# Patient Record
Sex: Male | Born: 1965 | Race: Black or African American | Hispanic: No | Marital: Married | Smoking: Never smoker
Health system: Southern US, Community
[De-identification: ages and names within clinical notes are randomized; demographics above are authoritative.]

## PROBLEM LIST (undated history)

## (undated) DIAGNOSIS — G459 Transient cerebral ischemic attack, unspecified: Secondary | ICD-10-CM

## (undated) DIAGNOSIS — I10 Essential (primary) hypertension: Secondary | ICD-10-CM

## (undated) HISTORY — DX: Transient cerebral ischemic attack, unspecified: G45.9

---

## 2011-11-30 ENCOUNTER — Other Ambulatory Visit: Payer: Self-pay | Admitting: Orthopedic Surgery

## 2011-11-30 DIAGNOSIS — M25562 Pain in left knee: Secondary | ICD-10-CM

## 2011-12-01 ENCOUNTER — Other Ambulatory Visit: Payer: Self-pay

## 2016-02-20 DIAGNOSIS — I10 Essential (primary) hypertension: Secondary | ICD-10-CM | POA: Insufficient documentation

## 2017-07-08 DIAGNOSIS — E7800 Pure hypercholesterolemia, unspecified: Secondary | ICD-10-CM | POA: Insufficient documentation

## 2019-02-17 DIAGNOSIS — U071 COVID-19: Secondary | ICD-10-CM

## 2019-02-17 HISTORY — DX: COVID-19: U07.1

## 2021-04-13 ENCOUNTER — Encounter (HOSPITAL_COMMUNITY): Payer: Self-pay | Admitting: Student in an Organized Health Care Education/Training Program

## 2021-04-13 ENCOUNTER — Other Ambulatory Visit: Payer: Self-pay

## 2021-04-13 ENCOUNTER — Emergency Department (HOSPITAL_COMMUNITY): Payer: Self-pay

## 2021-04-13 ENCOUNTER — Inpatient Hospital Stay (HOSPITAL_COMMUNITY)
Admission: EM | Admit: 2021-04-13 | Discharge: 2021-04-17 | DRG: 064 | Disposition: A | Discharge status: Rehabilitation Facility | Payer: Self-pay | Attending: Internal Medicine | Admitting: Internal Medicine

## 2021-04-13 DIAGNOSIS — R29810 Facial weakness: Secondary | ICD-10-CM | POA: Diagnosis present

## 2021-04-13 DIAGNOSIS — E876 Hypokalemia: Secondary | ICD-10-CM | POA: Diagnosis present

## 2021-04-13 DIAGNOSIS — Z91199 Patient's noncompliance with other medical treatment and regimen due to unspecified reason: Secondary | ICD-10-CM

## 2021-04-13 DIAGNOSIS — I615 Nontraumatic intracerebral hemorrhage, intraventricular: Secondary | ICD-10-CM | POA: Diagnosis present

## 2021-04-13 DIAGNOSIS — Z8249 Family history of ischemic heart disease and other diseases of the circulatory system: Secondary | ICD-10-CM

## 2021-04-13 DIAGNOSIS — G8194 Hemiplegia, unspecified affecting left nondominant side: Secondary | ICD-10-CM | POA: Diagnosis present

## 2021-04-13 DIAGNOSIS — Z9114 Patient's other noncompliance with medication regimen: Secondary | ICD-10-CM

## 2021-04-13 DIAGNOSIS — S06330A Contusion and laceration of cerebrum, unspecified, without loss of consciousness, initial encounter: Secondary | ICD-10-CM

## 2021-04-13 DIAGNOSIS — R29713 NIHSS score 13: Secondary | ICD-10-CM | POA: Diagnosis present

## 2021-04-13 DIAGNOSIS — E785 Hyperlipidemia, unspecified: Secondary | ICD-10-CM | POA: Diagnosis present

## 2021-04-13 DIAGNOSIS — I161 Hypertensive emergency: Secondary | ICD-10-CM | POA: Diagnosis present

## 2021-04-13 DIAGNOSIS — Z20822 Contact with and (suspected) exposure to covid-19: Secondary | ICD-10-CM | POA: Diagnosis present

## 2021-04-13 DIAGNOSIS — R471 Dysarthria and anarthria: Secondary | ICD-10-CM | POA: Diagnosis present

## 2021-04-13 DIAGNOSIS — I674 Hypertensive encephalopathy: Secondary | ICD-10-CM | POA: Diagnosis present

## 2021-04-13 DIAGNOSIS — I610 Nontraumatic intracerebral hemorrhage in hemisphere, subcortical: Principal | ICD-10-CM | POA: Diagnosis present

## 2021-04-13 DIAGNOSIS — I129 Hypertensive chronic kidney disease with stage 1 through stage 4 chronic kidney disease, or unspecified chronic kidney disease: Secondary | ICD-10-CM | POA: Diagnosis present

## 2021-04-13 DIAGNOSIS — E1122 Type 2 diabetes mellitus with diabetic chronic kidney disease: Secondary | ICD-10-CM | POA: Diagnosis present

## 2021-04-13 DIAGNOSIS — Z6829 Body mass index (BMI) 29.0-29.9, adult: Secondary | ICD-10-CM

## 2021-04-13 DIAGNOSIS — I619 Nontraumatic intracerebral hemorrhage, unspecified: Secondary | ICD-10-CM | POA: Diagnosis present

## 2021-04-13 DIAGNOSIS — N189 Chronic kidney disease, unspecified: Secondary | ICD-10-CM | POA: Diagnosis present

## 2021-04-13 DIAGNOSIS — Z8616 Personal history of COVID-19: Secondary | ICD-10-CM

## 2021-04-13 DIAGNOSIS — G936 Cerebral edema: Secondary | ICD-10-CM | POA: Diagnosis present

## 2021-04-13 DIAGNOSIS — N179 Acute kidney failure, unspecified: Secondary | ICD-10-CM | POA: Diagnosis present

## 2021-04-13 HISTORY — DX: Essential (primary) hypertension: I10

## 2021-04-13 LAB — COMPREHENSIVE METABOLIC PANEL
ALT: 30 U/L (ref 0–44)
AST: 46 U/L — ABNORMAL HIGH (ref 15–41)
Albumin: 4 g/dL (ref 3.5–5.0)
Alkaline Phosphatase: 132 U/L — ABNORMAL HIGH (ref 38–126)
Anion gap: 12 (ref 5–15)
BUN: 18 mg/dL (ref 6–20)
CO2: 27 mmol/L (ref 22–32)
Calcium: 8.9 mg/dL (ref 8.9–10.3)
Chloride: 99 mmol/L (ref 98–111)
Creatinine, Ser: 2.05 mg/dL — ABNORMAL HIGH (ref 0.61–1.24)
GFR, Estimated: 38 mL/min — ABNORMAL LOW (ref >60)
Glucose, Bld: 105 mg/dL — ABNORMAL HIGH (ref 70–99)
Potassium: 3.2 mmol/L — ABNORMAL LOW (ref 3.5–5.1)
Sodium: 138 mmol/L (ref 135–145)
Total Bilirubin: 1.1 mg/dL (ref 0.3–1.2)
Total Protein: 7.8 g/dL (ref 6.5–8.1)

## 2021-04-13 LAB — CBC
HCT: 41.9 % (ref 39.0–52.0)
Hemoglobin: 14.4 g/dL (ref 13.0–17.0)
MCH: 29.4 pg (ref 26.0–34.0)
MCHC: 34.4 g/dL (ref 30.0–36.0)
MCV: 85.7 fL (ref 80.0–100.0)
Platelets: 191 10*3/uL (ref 150–400)
RBC: 4.89 MIL/uL (ref 4.22–5.81)
RDW: 13.2 % (ref 11.5–15.5)
WBC: 7.9 10*3/uL (ref 4.0–10.5)
nRBC: 0 % (ref 0.0–0.2)

## 2021-04-13 LAB — RESP PANEL BY RT-PCR (FLU A&B, COVID) ARPGX2
Influenza A by PCR: NEGATIVE
Influenza B by PCR: NEGATIVE
SARS Coronavirus 2 by RT PCR: NEGATIVE

## 2021-04-13 LAB — I-STAT CHEM 8, ED
BUN: 24 mg/dL — ABNORMAL HIGH (ref 6–20)
Calcium, Ion: 0.99 mmol/L — ABNORMAL LOW (ref 1.15–1.40)
Chloride: 100 mmol/L (ref 98–111)
Creatinine, Ser: 2.1 mg/dL — ABNORMAL HIGH (ref 0.61–1.24)
Glucose, Bld: 104 mg/dL — ABNORMAL HIGH (ref 70–99)
HCT: 43 % (ref 39.0–52.0)
Hemoglobin: 14.6 g/dL (ref 13.0–17.0)
Potassium: 3.2 mmol/L — ABNORMAL LOW (ref 3.5–5.1)
Sodium: 141 mmol/L (ref 135–145)
TCO2: 32 mmol/L (ref 22–32)

## 2021-04-13 LAB — PROTIME-INR
INR: 1 (ref 0.8–1.2)
Prothrombin Time: 13.2 seconds (ref 11.4–15.2)

## 2021-04-13 LAB — DIFFERENTIAL
Abs Immature Granulocytes: 0.02 10*3/uL (ref 0.00–0.07)
Basophils Absolute: 0.1 10*3/uL (ref 0.0–0.1)
Basophils Relative: 1 %
Eosinophils Absolute: 0.2 10*3/uL (ref 0.0–0.5)
Eosinophils Relative: 2 %
Immature Granulocytes: 0 %
Lymphocytes Relative: 26 %
Lymphs Abs: 2.1 10*3/uL (ref 0.7–4.0)
Monocytes Absolute: 0.5 10*3/uL (ref 0.1–1.0)
Monocytes Relative: 6 %
Neutro Abs: 5.1 10*3/uL (ref 1.7–7.7)
Neutrophils Relative %: 65 %

## 2021-04-13 LAB — APTT: aPTT: 25 seconds (ref 24–36)

## 2021-04-13 LAB — ETHANOL: Alcohol, Ethyl (B): <10 mg/dL (ref <10)

## 2021-04-13 LAB — CBG MONITORING, ED: Glucose-Capillary: 104 mg/dL — ABNORMAL HIGH (ref 70–99)

## 2021-04-13 MED ORDER — ONDANSETRON HCL 4 MG/2ML IJ SOLN
INTRAMUSCULAR | Status: AC
Start: 2021-04-13 — End: 2021-04-13
  Administered 2021-04-13: 4 mg via INTRAVENOUS
  Filled 2021-04-13: qty 2

## 2021-04-13 MED ORDER — ACETAMINOPHEN 325 MG PO TABS
650.0000 mg | ORAL_TABLET | ORAL | Status: DC | PRN
  Administered 2021-04-15 – 2021-04-17 (×3): 650 mg via ORAL
  Filled 2021-04-13 (×3): qty 2

## 2021-04-13 MED ORDER — CHLORHEXIDINE GLUCONATE CLOTH 2 % EX PADS
6.0000 | MEDICATED_PAD | Freq: Every day | CUTANEOUS | Status: DC
  Administered 2021-04-14 – 2021-04-16 (×3): 6 via TOPICAL

## 2021-04-13 MED ORDER — ACETAMINOPHEN 160 MG/5ML PO SOLN: 650.0000 mg | ORAL | Status: DC | PRN

## 2021-04-13 MED ORDER — CLEVIDIPINE BUTYRATE 0.5 MG/ML IV EMUL
0.0000 mg/h | INTRAVENOUS | Status: DC
  Administered 2021-04-13: 23:00:00 21 mg/h via INTRAVENOUS
  Administered 2021-04-14: 06:00:00 25 mg/h via INTRAVENOUS
  Administered 2021-04-14: 22:00:00 16 mg/h via INTRAVENOUS
  Administered 2021-04-14: 28 mg/h via INTRAVENOUS
  Administered 2021-04-14: 05:00:00 25 mg/h via INTRAVENOUS
  Administered 2021-04-14: 01:00:00 28 mg/h via INTRAVENOUS
  Administered 2021-04-15: 32 mg/h via INTRAVENOUS
  Administered 2021-04-15: 23 mg/h via INTRAVENOUS
  Administered 2021-04-15: 24 mg/h via INTRAVENOUS
  Administered 2021-04-15: 23 mg/h via INTRAVENOUS
  Administered 2021-04-15: 25 mg/h via INTRAVENOUS
  Administered 2021-04-15: 28 mg/h via INTRAVENOUS
  Administered 2021-04-15: 32 mg/h via INTRAVENOUS
  Administered 2021-04-15: 28 mg/h via INTRAVENOUS
  Administered 2021-04-15 (×3): 25 mg/h via INTRAVENOUS
  Administered 2021-04-15: 23 mg/h via INTRAVENOUS
  Administered 2021-04-16: 18 mg/h via INTRAVENOUS
  Administered 2021-04-16: 21 mg/h via INTRAVENOUS
  Filled 2021-04-13 (×5): qty 100
  Filled 2021-04-13 (×2): qty 200
  Filled 2021-04-13: qty 50
  Filled 2021-04-13 (×2): qty 100
  Filled 2021-04-13: qty 200
  Filled 2021-04-13 (×3): qty 100
  Filled 2021-04-13: qty 200
  Filled 2021-04-13 (×4): qty 100
  Filled 2021-04-13: qty 200

## 2021-04-13 MED ORDER — POTASSIUM CHLORIDE 10 MEQ/100ML IV SOLN
10.0000 meq | INTRAVENOUS | Status: AC
  Administered 2021-04-14 (×6): 10 meq via INTRAVENOUS
  Filled 2021-04-13 (×6): qty 100

## 2021-04-13 MED ORDER — IOHEXOL 350 MG/ML SOLN
60.0000 mL | Freq: Once | INTRAVENOUS | Status: AC | PRN
  Administered 2021-04-13: 60 mL via INTRAVENOUS

## 2021-04-13 MED ORDER — LABETALOL HCL 5 MG/ML IV SOLN
INTRAVENOUS | Status: AC
  Administered 2021-04-13: 10 mg via INTRAVENOUS
  Filled 2021-04-13: qty 4

## 2021-04-13 MED ORDER — SENNOSIDES-DOCUSATE SODIUM 8.6-50 MG PO TABS
1.0000 | ORAL_TABLET | Freq: Two times a day (BID) | ORAL | Status: DC
  Administered 2021-04-14 – 2021-04-17 (×6): 1 via ORAL
  Filled 2021-04-13 (×7): qty 1

## 2021-04-13 MED ORDER — PANTOPRAZOLE SODIUM 40 MG IV SOLR
40.0000 mg | Freq: Every day | INTRAVENOUS | Status: DC
  Administered 2021-04-14: 40 mg via INTRAVENOUS
  Filled 2021-04-13: qty 10

## 2021-04-13 MED ORDER — ONDANSETRON HCL 4 MG/2ML IJ SOLN
4.0000 mg | Freq: Four times a day (QID) | INTRAMUSCULAR | Status: DC | PRN
  Administered 2021-04-15: 4 mg via INTRAVENOUS
  Filled 2021-04-13: qty 2

## 2021-04-13 MED ORDER — LABETALOL HCL 5 MG/ML IV SOLN: 20.0000 mg | Freq: Once | INTRAVENOUS | Status: AC

## 2021-04-13 MED ORDER — STROKE: EARLY STAGES OF RECOVERY BOOK
Freq: Once | Status: AC
  Filled 2021-04-13: qty 1

## 2021-04-13 MED ORDER — ACETAMINOPHEN 650 MG RE SUPP: 650.0000 mg | RECTAL | Status: DC | PRN

## 2021-04-13 MED ORDER — CLEVIDIPINE BUTYRATE 0.5 MG/ML IV EMUL
INTRAVENOUS | Status: AC
  Administered 2021-04-13: 2 mg/h via INTRAVENOUS
  Filled 2021-04-13: qty 50

## 2021-04-13 NOTE — ED Triage Notes (Signed)
Pt BIB [facility], code stroke activated in the field, LKW 2020. Pt c/o left arm and left leg weakness, numbness to his lip and sensation differences to the left extremities. Pt alert and following commands.

## 2021-04-13 NOTE — ED Provider Notes (Signed)
Provider Note   CSN: [id number] Arrival date & time: 04/13/21  2110  An emergency department physician performed an initial assessment on this suspected stroke patient at 2110.  History  Chief Complaint  Patient presents with   Code Stroke    Miguel Mccullough is a 56 y.o. male.  Presenting to ER as code stroke.  Last known well 2020, left arm and leg weakness.  Patient alert, following commands.  History obtained from EMS report, patient.  History is limited due to acuity.  EMS report profound hypertension in route.  HPI     Home Medications Prior to Admission medications   Not on File      Allergies    Patient has no allergy information on record.    Review of Systems   Review of Systems  Unable to perform ROS: Acuity of condition   Physical Exam Updated Vital Signs BP 120/67    Pulse 91    Temp 98.5 F (36.9 C) (Oral)    Resp (!) 23    Wt 80.4 kg    SpO2 97%  Physical Exam Vitals and nursing note reviewed.  Constitutional:      General: He is not in acute distress.    Appearance: He is well-developed.  HENT:     Head: Normocephalic and atraumatic.  Eyes:     Conjunctiva/sclera: Conjunctivae normal.  Cardiovascular:     Rate and Rhythm: Normal rate and regular rhythm.     Heart sounds: No murmur heard. Pulmonary:     Effort: Pulmonary effort is normal. No respiratory distress.     Breath sounds: Normal breath sounds.  Abdominal:     Palpations: Abdomen is soft.     Tenderness: There is no abdominal tenderness.  Musculoskeletal:        General: No swelling.     Cervical back: Neck supple.  Skin:    General: Skin is warm and dry.     Capillary Refill: Capillary refill takes less than 2 seconds.  Neurological:     Mental Status: He is alert.     Comments: Alert, oriented, cranial nerves II through XII intact except for some mild left facial weakness, left upper extremity and left lower extremity weakness, normal strength in right upper and right lower  extremity  Psychiatric:        Mood and Affect: Mood normal.    ED Results / Procedures / Treatments   Labs (all labs ordered are listed, but only abnormal results are displayed) Labs Reviewed  COMPREHENSIVE METABOLIC PANEL - Abnormal; Notable for the following components:      Result Value   Potassium 3.2 (*)    Glucose, Bld 105 (*)    Creatinine, Ser 2.05 (*)    AST 46 (*)    Alkaline Phosphatase 132 (*)    GFR, Estimated 38 (*)    All other components within normal limits  URINALYSIS, ROUTINE W REFLEX MICROSCOPIC - Abnormal; Notable for the following components:   Specific Gravity, Urine >1.046 (*)    Glucose, UA 50 (*)    Protein, ur >=300 (*)    Bacteria, UA RARE (*)    All other components within normal limits  LIPID PANEL - Abnormal; Notable for the following components:   Cholesterol 203 (*)    Triglycerides 248 (*)    VLDL 50 (*)    All other components within normal limits  HEMOGLOBIN A1C - Abnormal; Notable for the following components:   Hgb A1c MFr  Bld 4.7 (*)    All other components within normal limits  CBG MONITORING, ED - Abnormal; Notable for the following components:   Glucose-Capillary 104 (*)    All other components within normal limits  I-STAT CHEM 8, ED - Abnormal; Notable for the following components:   Potassium 3.2 (*)    BUN 24 (*)    Creatinine, Ser 2.10 (*)    Glucose, Bld 104 (*)    Calcium, Ion 0.99 (*)    All other components within normal limits  RESP PANEL BY RT-PCR (FLU A&B, COVID) ARPGX2  MRSA NEXT GEN BY PCR, NASAL  ETHANOL  PROTIME-INR  APTT  CBC  DIFFERENTIAL  RAPID URINE DRUG SCREEN, HOSP PERFORMED  HIV ANTIBODY (ROUTINE TESTING W REFLEX)    EKG EKG Interpretation  Date/Time:  Sunday April 13 2021 21:45:41 EST Ventricular Rate:  90 PR Interval:  204 QRS Duration: 89 QT Interval:  419 QTC Calculation: 513 R Axis:   35 Text Interpretation: Sinus rhythm Borderline prolonged PR interval Biatrial enlargement LVH with  secondary repolarization abnormality Anterior ST elevation, probably due to LVH Prolonged QT interval Confirmed by Madalyn Rob [phone]) on 04/14/2021 3:11:33 PM  Radiology CT HEAD WO CONTRAST (5MM)  Result Date: 04/14/2021 CLINICAL DATA:  Hemorrhage follow-up EXAM: CT HEAD WITHOUT CONTRAST TECHNIQUE: Contiguous axial images were obtained from the base of the skull through the vertex without intravenous contrast. RADIATION DOSE REDUCTION: This exam was performed according to the departmental dose-optimization program which includes automated exposure control, adjustment of the mA and/or kV according to patient size and/or use of iterative reconstruction technique. COMPARISON:  04/13/2021 FINDINGS: Brain: Unchanged size of intraparenchymal hematoma centered in the right basal ganglia with extension into the right lateral ventricle. No hydrocephalus. 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 Vascular: No hyperdense vessel or unexpected calcification. Skull: Normal. Negative for fracture or focal lesion. Sinuses/Orbits: No acute finding. Other: None. IMPRESSION: Unchanged size of intraparenchymal hematoma centered in the right basal ganglia with intraventricular extension. No hydrocephalus. Electronically Signed   By: Ulyses Jarred M.D.   On: 04/14/2021 03:58   MR BRAIN WO CONTRAST  Result Date: 04/14/2021 CLINICAL DATA:  Stroke follow-up EXAM: MRI HEAD WITHOUT CONTRAST TECHNIQUE: Multiplanar, multiecho pulse sequences of the brain and surrounding structures were obtained without intravenous contrast. COMPARISON:  Head CT from earlier today FINDINGS: Brain: Known acute hemorrhage in the right basal ganglia with intraventricular extension. No detected progression given cross modality differences. No hydrocephalus. There have been other prior microhemorrhages in the white matter, cerebellum, and brainstem, likely hypertensive. Gliosis in the pons and cerebral white matter which is extensive, with  lacune at the left centrum semiovale and remote perforator infarct at the right basal ganglia. Constellation of findings correlate with chart history of hypertension. Brain volume is normal. Accounting for susceptibility artifact no acute infarct is seen adjacent to the hematoma. No gross mass lesion either. Vascular: Preceding CTA. Skull and upper cervical spine: Negative Sinuses/Orbits: Negative IMPRESSION: 1. Right basal ganglia hematoma with intraventricular extension that is stable from prior head CT. No hydrocephalus. 2. Hemorrhage location and background small vessel ischemic gliosis with chronic microhemorrhages suggests microvascular cause. Electronically Signed   By: Jorje Guild M.D.   On: 04/14/2021 10:43   ECHOCARDIOGRAM COMPLETE  Result Date: 04/14/2021    ECHOCARDIOGRAM REPORT   Patient Name:   Miguel Mccullough Date of Exam: 04/14/2021 Medical Rec #:  [id number]     Height: Accession #:    [id number]  Weight:       177.2 lb Date of Birth:  Oct 23, 1965      BSA:          1.863 m Patient Age:    55 years      BP:           151/88 mmHg Patient Gender: M             HR:           102 bpm. Exam Location:  Inpatient Procedure: 2D Echo, Cardiac Doppler and Color Doppler Indications:    I63.9 STROKE  History:        Patient has no prior history of Echocardiogram examinations.                 Risk Factors:Hypertension.  Sonographer:    Beryle Beams Referring Phys: [id number] ASHISH ARORA IMPRESSIONS  1. Left ventricular ejection fraction, by estimation, is 70 to 75%. The left ventricle has hyperdynamic function. The left ventricle has no regional wall motion abnormalities. There is severe concentric left ventricular hypertrophy. Left ventricular diastolic parameters are consistent with Grade II diastolic dysfunction (pseudonormalization).  2. Right ventricular systolic function is normal. The right ventricular size is normal.  3. Left atrial size was mild to moderately dilated.  4. The mitral valve is normal  in structure. Trivial mitral valve regurgitation. No evidence of mitral stenosis.  5. The aortic valve is tricuspid. There is mild thickening of the aortic valve. Aortic valve regurgitation is not visualized. Aortic valve sclerosis is present, with no evidence of aortic valve stenosis.  6. The inferior vena cava is normal in size with <50% respiratory variability, suggesting right atrial pressure of 8 mmHg.  7. Given severe LVH which may be related to hypertensive heart disease, would also consider CMR to assess for possible HCM. Comparison(s): No prior Echocardiogram. Conclusion(s)/Recommendation(s): No intracardiac source of embolism detected on this transthoracic study. Consider a transesophageal echocardiogram to exclude cardiac source of embolism if clinically indicated. FINDINGS  Left Ventricle: Left ventricular ejection fraction, by estimation, is 70 to 75%. The left ventricle has hyperdynamic function. The left ventricle has no regional wall motion abnormalities. The left ventricular internal cavity size was normal in size. There is severe concentric left ventricular hypertrophy. Left ventricular diastolic parameters are consistent with Grade II diastolic dysfunction (pseudonormalization). Right Ventricle: The right ventricular size is normal. Right vetricular wall thickness was not well visualized. Right ventricular systolic function is normal. Left Atrium: Left atrial size was mild to moderately dilated. Right Atrium: Right atrial size was normal in size. Pericardium: There is no evidence of pericardial effusion. Mitral Valve: The mitral valve is normal in structure. There is mild thickening of the mitral valve leaflet(s). Trivial mitral valve regurgitation. No evidence of mitral valve stenosis. Tricuspid Valve: The tricuspid valve is normal in structure. Tricuspid valve regurgitation is trivial. Aortic Valve: The aortic valve is tricuspid. There is mild thickening of the aortic valve. Aortic valve  regurgitation is not visualized. Aortic valve sclerosis is present, with no evidence of aortic valve stenosis. Aortic valve mean gradient measures 12.0 mmHg. Aortic valve peak gradient measures 23.8 mmHg. Aortic valve area, by VTI measures 1.44 cm. Pulmonic Valve: The pulmonic valve was normal in structure. Pulmonic valve regurgitation is trivial. Aorta: The aortic root is normal in size and structure. Venous: The inferior vena cava is normal in size with less than 50% respiratory variability, suggesting right atrial pressure of 8 mmHg. IAS/Shunts: The atrial septum is grossly  normal.  LEFT VENTRICLE PLAX 2D LVIDd:         4.10 cm      Diastology LVIDs:         2.20 cm      LV e' medial:    7.58 cm/s LV PW:         1.30 cm      LV E/e' medial:  15.2 LV IVS:        1.50 cm      LV e' lateral:   8.21 cm/s LVOT diam:     1.70 cm      LV E/e' lateral: 14.0 LV SV:         49 LV SV Index:   27 LVOT Area:     2.27 cm  LV Volumes (MOD) LV vol d, MOD A2C: 98.4 ml LV vol d, MOD A4C: 108.0 ml LV vol s, MOD A2C: 44.4 ml LV vol s, MOD A4C: 51.0 ml LV SV MOD A2C:     54.0 ml LV SV MOD A4C:     108.0 ml LV SV MOD BP:      62.6 ml RIGHT VENTRICLE             IVC RV S prime:     20.50 cm/s  IVC diam: 1.90 cm RVOT diam:      2.00 cm TAPSE (M-mode): 2.9 cm LEFT ATRIUM             Index        RIGHT ATRIUM           Index LA diam:        4.30 cm 2.31 cm/m   RA Area:     14.20 cm LA Vol (A2C):   73.3 ml 39.34 ml/m  RA Volume:   35.60 ml  19.11 ml/m LA Vol (A4C):   68.7 ml 36.87 ml/m LA Biplane Vol: 74.4 ml 39.93 ml/m  AORTIC VALVE                     PULMONIC VALVE AV Area (Vmax):    1.43 cm      PV Vmax:       0.69 m/s AV Area (Vmean):   1.42 cm      PV Vmean:      43.200 cm/s AV Area (VTI):     1.44 cm      PV VTI:        0.107 m AV Vmax:           244.00 cm/s   PV Peak grad:  1.9 mmHg AV Vmean:          160.000 cm/s  PV Mean grad:  1.0 mmHg AV VTI:            0.343 m AV Peak Grad:      23.8 mmHg AV Mean Grad:      12.0  mmHg LVOT Vmax:         154.00 cm/s LVOT Vmean:        100.000 cm/s LVOT VTI:          0.218 m LVOT/AV VTI ratio: 0.64  AORTA Ao Root diam: 2.50 cm Ao Asc diam:  2.80 cm MITRAL VALVE MV Area (PHT): 4.63 cm     SHUNTS MV Decel Time: 164 msec     Systemic VTI:  0.22 m MR Peak grad: 51.6 mmHg     Systemic Diam: 1.70 cm MR Mean grad: 33.0  mmHg     Pulmonic Diam: 2.00 cm MR Vmax:      359.00 cm/s MR Vmean:     265.0 cm/s MV E velocity: 115.00 cm/s MV A velocity: 78.30 cm/s MV E/A ratio:  1.47 Gwyndolyn Kaufman MD Electronically signed by Gwyndolyn Kaufman MD Signature Date/Time: 04/14/2021/11:27:08 AM    Final    CT HEAD CODE STROKE WO CONTRAST  Result Date: 04/13/2021 CLINICAL DATA:  Code stroke. EXAM: CT HEAD WITHOUT CONTRAST TECHNIQUE: Contiguous axial images were obtained from the base of the skull through the vertex without intravenous contrast. RADIATION DOSE REDUCTION: This exam was performed according to the departmental dose-optimization program which includes automated exposure control, adjustment of the mA and/or kV according to patient size and/or use of iterative reconstruction technique. COMPARISON:  None. FINDINGS: Brain: Right basal ganglia intraparenchymal hematoma measures 2.6 x 1.7 x 3.0 cm. There is intraventricular extension of blood. No hydrocephalus. Mass effect on the right lateral ventricle without midline shift. Old right corpus striatum small vessel infarct. Small focus of hyperdensity at the anterior right temporal lobe. Vascular: No abnormal hyperdensity of the major intracranial arteries or dural venous sinuses. No intracranial atherosclerosis. Skull: The visualized skull base, calvarium and extracranial soft tissues are normal. Sinuses/Orbits: No fluid levels or advanced mucosal thickening of the visualized paranasal sinuses. No mastoid or middle ear effusion. The orbits are normal. IMPRESSION: 1. Right basal ganglia intraparenchymal hematoma (2.6 x 1.7 x 3.0 cm) with intraventricular  extension of blood. No hydrocephalus. 2. Small focus of hyperdensity at the anterior right temporal lobe, possibly a small amount of subarachnoid hemorrhage or a cavernous malformation. Critical Value/emergent results were called by telephone at the time of interpretation on 04/13/2021 at 9:43 pm to provider [facility] , who verbally acknowledged these results. Electronically Signed   By: Ulyses Jarred M.D.   On: 04/13/2021 21:43   CT ANGIO HEAD NECK W WO CM (CODE STROKE)  Result Date: 04/13/2021 CLINICAL DATA:  Intracranial hemorrhage EXAM: CT ANGIOGRAPHY HEAD AND NECK TECHNIQUE: Multidetector CT imaging of the head and neck was performed using the standard protocol during bolus administration of intravenous contrast. Multiplanar CT image reconstructions and MIPs were obtained to evaluate the vascular anatomy. Carotid stenosis measurements (when applicable) are obtained utilizing NASCET criteria, using the distal internal carotid diameter as the denominator. RADIATION DOSE REDUCTION: This exam was performed according to the departmental dose-optimization program which includes automated exposure control, adjustment of the mA and/or kV according to patient size and/or use of iterative reconstruction technique. CONTRAST:  78mL OMNIPAQUE IOHEXOL 350 MG/ML SOLN COMPARISON:  None. FINDINGS: CTA NECK FINDINGS SKELETON: There is no bony spinal canal stenosis. No lytic or blastic lesion. OTHER NECK: Normal pharynx, larynx and major salivary glands. No cervical lymphadenopathy. Left hemithyroidectomy. UPPER CHEST: No pneumothorax or pleural effusion. No nodules or masses. AORTIC ARCH: There is no calcific atherosclerosis of the aortic arch. There is no aneurysm, dissection or hemodynamically significant stenosis of the visualized portion of the aorta. Conventional 3 vessel aortic branching pattern. The visualized proximal subclavian arteries are widely patent. RIGHT CAROTID SYSTEM: Normal without aneurysm, dissection  or stenosis. LEFT CAROTID SYSTEM: Normal without aneurysm, dissection or stenosis. VERTEBRAL ARTERIES: Left dominant configuration. Both origins are clearly patent. There is no dissection, occlusion or flow-limiting stenosis to the skull base (V1-V3 segments). CTA HEAD FINDINGS POSTERIOR CIRCULATION: --Vertebral arteries: Normal V4 segments. --Inferior cerebellar arteries: Normal. --Basilar artery: Normal. --Superior cerebellar arteries: Normal. --Posterior cerebral arteries (PCA): Normal. ANTERIOR CIRCULATION: --Intracranial internal  carotid arteries: Normal. --Anterior cerebral arteries (ACA): Normal. Both A1 segments are present. Patent anterior communicating artery (a-comm). --Middle cerebral arteries (MCA): Normal. VENOUS SINUSES: As permitted by contrast timing, patent. ANATOMIC VARIANTS: None Review of the MIP images confirms the above findings. IMPRESSION: 1. No aneurysm, dissection, occlusion or hemodynamically significant stenosis of the major cervical or intracranial arteries. Electronically Signed   By: Ulyses Jarred M.D.   On: 04/13/2021 21:37    Procedures .Critical Care Performed by: Lucrezia Starch, MD Authorized by: Lucrezia Starch, MD   Critical care provider statement:    Critical care time (minutes):  33   Critical care was time spent personally by me on the following activities:  Development of treatment plan with patient or surrogate, discussions with consultants, evaluation of patient's response to treatment, examination of patient, ordering and review of laboratory studies, ordering and review of radiographic studies, ordering and performing treatments and interventions, pulse oximetry, re-evaluation of patient's condition and review of old charts    Medications Ordered in ED Medications  acetaminophen (TYLENOL) tablet 650 mg (has no administration in time range)    Or  acetaminophen (TYLENOL) 160 MG/5ML solution 650 mg (has no administration in time range)    Or   acetaminophen (TYLENOL) suppository 650 mg (has no administration in time range)  senna-docusate (Senokot-S) tablet 1 tablet (1 tablet Oral Not Given 04/14/21 1018)  pantoprazole (PROTONIX) injection 40 mg (40 mg Intravenous Not Given 04/13/21 2300)  labetalol (NORMODYNE) injection 20 mg (10 mg Intravenous Given 04/13/21 2128)    And  clevidipine (CLEVIPREX) infusion 0.5 mg/mL (20 mg/hr Intravenous Infusion Verify 04/14/21 1200)  ondansetron (ZOFRAN) injection 4 mg (4 mg Intravenous Given 04/13/21 2350)  Chlorhexidine Gluconate Cloth 2 % PADS 6 each (6 each Topical Given 04/14/21 1018)  labetalol (NORMODYNE) injection 10 mg (10 mg Intravenous Given 04/14/21 0924)   stroke: mapping our early stages of recovery book ( Does not apply Given 04/14/21 0027)  iohexol (OMNIPAQUE) 350 MG/ML injection 60 mL (60 mLs Intravenous Contrast Given 04/13/21 2122)  potassium chloride 10 mEq in 100 mL IVPB (0 mEq Intravenous Stopped 04/14/21 0656)    ED Course/ Medical Decision Making/ A&P                           Medical Decision Making Risk Decision regarding hospitalization.   56 year old gentleman presenting to ER as code stroke.  Sudden onset of left-sided weakness.  On arrival to ER patient noted to have weakness in left upper and lower extremities.  CT head obtained, concerning for intraparenchymal hemorrhage.  Dr. Malen Gauze evaluated upon arrival with neurology, will admit to ICU.  Gave dose of labetalol and started on Cleviprex for blood pressure control.  Independently reviewed CT head imaging and agree with radiology report.  Lab work reviewed, noted elevation in creatinine but no prior for comparison.  Slight hypokalemia.  No anemia.        Final Clinical Impression(s) / ED Diagnoses Final diagnoses:  Intraparenchymal hematoma of brain without loss of consciousness, unspecified laterality, initial encounter [facility])    Rx / DC Orders ED Discharge Orders     None         Lucrezia Starch, MD 04/14/21 [id number]

## 2021-04-13 NOTE — ED Notes (Signed)
Wife at bedside.

## 2021-04-13 NOTE — ED Notes (Signed)
Vital signs stable. 

## 2021-04-13 NOTE — Code Documentation (Signed)
Stroke Response Nurse Documentation Code Documentation  Miguel Mccullough is a 56 y.o. male arriving to [facility] ED via [facility] on 2/26 with past medical hx of HTN per patient. On No antithrombotic. Code stroke was activated by EMS.   Patient from home where he was LKW at 2020 and now complaining of left sided weakness.    Stroke team at the bedside on patient arrival. Labs drawn and patient cleared for CT by Dr. Roslynn Amble. Patient to CT with team. NIHSS 14, see documentation for details and code stroke times. Patient with disoriented, left facial droop, left arm weakness, left leg weakness, left decreased sensation, and Sensory  neglect on exam. The following imaging was completed:  CT, CTA head and neck. Patient is not a candidate for IV Thrombolytic due to hemorrhage.  Bedside handoff with ED RN Hassan Rowan.    Madelynn Done  Rapid Response RN

## 2021-04-13 NOTE — ED Notes (Signed)
Labetolol 10 given by Rapid Response RN

## 2021-04-13 NOTE — H&P (Signed)
Left sided weakness Complete left hemiparesis and sensory loss [place] BG bleed with IVH  Neurology H&P CC: left sided weakness  History is obtained from: Patient, chart  HPI: Miguel Mccullough is a 56 y.o. male past medical history of hypertension not on medication, presented to the emergency room for evaluation of sudden onset of left-sided weakness, left facial droop and slurred speech along with left-sided numbness. Patient was usual state of health till 8:20 PM and suddenly started noticing some numbness on the left face, his face started to droop he had arm and leg numbness followed by weakness to the point that he could not move the left arm or leg at all.  EMTs were called.  They evaluated the patient on scene and were concern for stroke for which a code stroke was activated. He was brought in for emergent evaluation to the emergency room.  On the way, the systolic blood pressure were in the 270s. Patient reports that he was on antihypertensives but had discontinued taking them and stopped seeing her doctor. Unclear reasons for noncompliance. CT head with right basal ganglia IPH with intraventricular extension.  Possible small amount of subarachnoid hemorrhage or cavernous malformation in the anterior right temporal lobe.   Denies chest pain shortness of breath.  Denies any preceding illness or sickness. Reports headache behind the right eye.  LKW: 2020 hrs. tpa given?: no, ICH Premorbid modified Rankin scale (mRS): 0   ROS: Full ROS was performed and is negative except as noted in the HPI.   Past Medical History:  Diagnosis Date   Hypertension   No family history on file. Social History:   has no history on file for tobacco use, alcohol use, and drug use. Medications  Current Facility-Administered Medications:     stroke: mapping our early stages of recovery book, , Does not apply, Once, Amie Portland, MD   acetaminophen (TYLENOL) tablet 650 mg, 650 mg, Oral, Q4H PRN  **OR** acetaminophen (TYLENOL) 160 MG/5ML solution 650 mg, 650 mg, Per Tube, Q4H PRN **OR** acetaminophen (TYLENOL) suppository 650 mg, 650 mg, Rectal, Q4H PRN, Amie Portland, MD   [COMPLETED] labetalol (NORMODYNE) injection 20 mg, 20 mg, Intravenous, Once, 10 mg at 04/13/21 2128 **AND** clevidipine (CLEVIPREX) infusion 0.5 mg/mL, 0-21 mg/hr, Intravenous, Continuous, Amie Portland, MD, Last Rate: 42 mL/hr at 04/13/21 2201, 21 mg/hr at 04/13/21 2201   ondansetron (ZOFRAN) injection 4 mg, 4 mg, Intravenous, Q6H PRN, Amie Portland, MD   pantoprazole (PROTONIX) injection 40 mg, 40 mg, Intravenous, QHS, Amie Portland, MD   senna-docusate (Senokot-S) tablet 1 tablet, 1 tablet, Oral, BID, Amie Portland, MD  Exam: Current vital signs: BP (!) 165/90    Pulse 94    Temp 98.1 F (36.7 C) (Tympanic)    Resp (!) 24    Wt 80.4 kg    SpO2 95%  Vital signs in last 24 hours: Temp:  [98.1 F (36.7 C)] 98.1 F (36.7 C) (02/26 2200) Pulse Rate:  [94-100] 94 (02/26 2157) Resp:  [24-32] 24 (02/26 2157) BP: (157-215)/(86-148) 165/90 (02/26 2157) SpO2:  [92 %-96 %] 95 % (02/26 2157) Weight:  [80.4 kg] 80.4 kg (02/26 2131) General: Awake alert in no distress HNT: Normocephalic atraumatic Lungs: Clear Cardiovascular: Regular rate rhythm Abdomen nondistended nontender Extremities warm well perfused Neurologic exam Alert awake oriented x3 Speech is mildly dysarthric No evidence of aphasia Cranial nerves: Pupils equal round react light, extraocular movements appear mildly hindered to the left with rightward preference but he is able to  go cross the midline all the way to the left.  No visual field deficits.  Left lower facial weakness observed.  Tongue and palate midline. Motor examination with plegic left upper and lower extremity.  Full strength right side. Sensation completely lost to light touch on the left. Coordination intact on right, unable to check on left. 1a Level of Conscious.: 0 1b LOC Questions:  0 1c LOC Commands: 0 2 Best Gaze: 1 3 Visual: 0 4 Facial Palsy: 1 5a Motor Arm - left: 4 5b Motor Arm - Right: 0 6a Motor Leg - Left: 4 6b Motor Leg - Right:  7 Limb Ataxia: 0 8 Sensory: 2 9 Best Language: 0 10 Dysarthria: 1 11 Extinct. and Inatten.: 0 TOTAL: 13 Labs I have reviewed labs in epic and the results pertinent to this consultation are: CBC    Component Value Date/Time   WBC 7.9 04/13/2021 2123   RBC 4.89 04/13/2021 2123   HGB 14.6 04/13/2021 2128   HCT 43.0 04/13/2021 2128   PLT 191 04/13/2021 2123   MCV 85.7 04/13/2021 2123   MCH 29.4 04/13/2021 2123   MCHC 34.4 04/13/2021 2123   RDW 13.2 04/13/2021 2123   LYMPHSABS 2.1 04/13/2021 2123   MONOABS 0.5 04/13/2021 2123   EOSABS 0.2 04/13/2021 2123   BASOSABS 0.1 04/13/2021 2123  CMP     Component Value Date/Time   NA 141 04/13/2021 2128   K 3.2 (L) 04/13/2021 2128   CL 100 04/13/2021 2128   CO2 27 04/13/2021 2123   GLUCOSE 104 (H) 04/13/2021 2128   BUN 24 (H) 04/13/2021 2128   CREATININE 2.10 (H) 04/13/2021 2128   CALCIUM 8.9 04/13/2021 2123   PROT 7.8 04/13/2021 2123   ALBUMIN 4.0 04/13/2021 2123   AST 46 (H) 04/13/2021 2123   ALT 30 04/13/2021 2123   ALKPHOS 132 (H) 04/13/2021 2123   BILITOT 1.1 04/13/2021 2123   GFRNONAA 38 (L) 04/13/2021 2123   Imaging I have reviewed the images obtained: CT-head IMPRESSION: 1. Right basal ganglia intraparenchymal hematoma (2.6 x 1.7 x 3.0 cm) with intraventricular extension of blood. No hydrocephalus. 2. Small focus of hyperdensity at the anterior right temporal lobe, possibly a small amount of subarachnoid hemorrhage or a cavernous malformation.  CT angio head and neck IMPRESSION: 1. No aneurysm, dissection, occlusion or hemodynamically significant stenosis of the major cervical or intracranial arteries.  Assessment: 56 year old man with past medical history of uncontrolled hypertension, noncompliant medications presenting with sudden onset of  left-sided tingling numbness weakness and facial droop along with slurred speech.  Symptoms consistent with the CT imaging finding of right basal ganglia hemorrhage with intraventricular extension. ICH score-1. We will be admitted to the neuro ICU for strict blood pressure control and close neuro monitoring.  Plan: Subcortical ICH, nontraumatic IVH, nontraumatic  Acuity: Acute Laterality: Right basal ganglia Current suspected etiology:   Hypertension Treatment: -Admit to neurological ICU -ICH Score: 1  -ICH Volume: 6 -BP control goal SYS 130-150-started on Cleviprex.  Can use up to 32. -Repeat head CT in 6 hours.  May need to contact neurosurgery if there is evidence of hydrocephalus.   -PT/OT/ST  -neuromonitoring  CNS Cerebral edema due to [place] -Hyperosmolar therapy not indicated at this time due to minimal localized cerebral edema around the [place]. -NSGY consult if repeat head CT shows hydrocephalus or midline shift.  Dysarthria Dysphagia following ICH  -NPO until cleared by speech -ST -Advance diet as tolerated  Hypertensive encephalopathy -Blood pressure goal as above.  Hemiplegia and hemiparesis following nontraumatic intracerebral hemorrhage affecting left non-dominant side  -Continue PT/OT/ST  RESP No active issues Protecting airway for now Monitor clinically  CV Hypertensive encephalopathy Hypertensive emergency -Blood pressure goal as above -Can use labetalol as needed and Cleviprex drip, which she is already on -Check transthoracic echo  GI/GU No prior renal function test available.  Comes in with a creatinine of 2.1 with a GFR of 38 Unclear if he has a history of CKD or this is just AKI.  Will need outpatient records for that determination. For now gentle hydration Avoid nephrotoxic agents May need renal consultation in the morning  HEME On no blood thinners, no coagulopathy. Monitor hemoglobin  ENDO Check A1c Goal A1c less than  7  Fluid/Electrolyte Disorders Hypokalemia with potassium 3.2.  Will replete. Check BMP in the morning.  ID Possible Aspiration PNA -CXR -NPO -Monitor   Prophylaxis DVT: SCD GI: PPI Bowel: Docusate senna  Dispo: To be determined  Diet: NPO until cleared by speech/bedside swallow  Code Status: Full Code  Comfort Measures DNR   THE FOLLOWING WERE PRESENT ON ADMISSION: Intracerebral hemorrhage, hemiplegia, hypertensive emergency, AKI/acute renal failure, hypokalemia, possible aspiration pneumonia  CRITICAL CARE ATTESTATION Performed by: Amie Portland, MD Total critical care time: 51 minutes Critical care time was exclusive of separately billable procedures and treating other patients and/or supervising APPs/Residents/Students Critical care was necessary to treat or prevent imminent or life-threatening deterioration due to intracerebral hemorrhage, hypertensive emergency. This patient is critically ill and at significant risk for neurological worsening and/or death and care requires constant monitoring. Critical care was time spent personally by me on the following activities: development of treatment plan with patient and/or surrogate as well as nursing, discussions with consultants, evaluation of patient's response to treatment, examination of patient, obtaining history from patient or surrogate, ordering and performing treatments and interventions, ordering and review of laboratory studies, ordering and review of radiographic studies, pulse oximetry, re-evaluation of patient's condition, participation in multidisciplinary rounds and medical decision making of high complexity in the care of this patient.  -- Amie Portland, MD Neurologist [facility] Pager: [phone]

## 2021-04-14 ENCOUNTER — Encounter (HOSPITAL_COMMUNITY): Payer: Self-pay | Admitting: Student in an Organized Health Care Education/Training Program

## 2021-04-14 ENCOUNTER — Inpatient Hospital Stay (HOSPITAL_COMMUNITY): Payer: Self-pay

## 2021-04-14 DIAGNOSIS — I6389 Other cerebral infarction: Secondary | ICD-10-CM

## 2021-04-14 LAB — ECHOCARDIOGRAM COMPLETE
AR max vel: 1.43 cm2
AV Area VTI: 1.44 cm2
AV Area mean vel: 1.42 cm2
AV Mean grad: 12 mmHg
AV Peak grad: 23.8 mmHg
Ao pk vel: 2.44 m/s
Area-P 1/2: 4.63 cm2
Calc EF: 56.9 %
MV M vel: 3.59 m/s
MV Peak grad: 51.6 mmHg
S' Lateral: 2.2 cm
Single Plane A2C EF: 54.9 %
Single Plane A4C EF: 52.8 %
Weight: 2836 oz

## 2021-04-14 LAB — LIPID PANEL
Cholesterol: 203 mg/dL — ABNORMAL HIGH (ref 0–200)
HDL: 59 mg/dL (ref >40)
LDL Cholesterol: 94 mg/dL (ref 0–99)
Total CHOL/HDL Ratio: 3.4 RATIO
Triglycerides: 248 mg/dL — ABNORMAL HIGH (ref <150)
VLDL: 50 mg/dL — ABNORMAL HIGH (ref 0–40)

## 2021-04-14 LAB — URINALYSIS, ROUTINE W REFLEX MICROSCOPIC
Bilirubin Urine: NEGATIVE
Glucose, UA: 50 mg/dL — AB
Hgb urine dipstick: NEGATIVE
Ketones, ur: NEGATIVE mg/dL
Leukocytes,Ua: NEGATIVE
Nitrite: NEGATIVE
Protein, ur: >=300 mg/dL — AB
Specific Gravity, Urine: >1.046 — ABNORMAL HIGH (ref 1.005–1.030)
pH: 5 (ref 5.0–8.0)

## 2021-04-14 LAB — RAPID URINE DRUG SCREEN, HOSP PERFORMED
Amphetamines: NOT DETECTED
Barbiturates: NOT DETECTED
Benzodiazepines: NOT DETECTED
Cocaine: NOT DETECTED
Opiates: NOT DETECTED
Tetrahydrocannabinol: NOT DETECTED

## 2021-04-14 LAB — HEMOGLOBIN A1C
Hgb A1c MFr Bld: 4.7 % — ABNORMAL LOW (ref 4.8–5.6)
Mean Plasma Glucose: 88.19 mg/dL

## 2021-04-14 LAB — MRSA NEXT GEN BY PCR, NASAL: MRSA by PCR Next Gen: NOT DETECTED

## 2021-04-14 LAB — HIV ANTIBODY (ROUTINE TESTING W REFLEX): HIV Screen 4th Generation wRfx: NONREACTIVE

## 2021-04-14 MED ORDER — LABETALOL HCL 5 MG/ML IV SOLN
10.0000 mg | INTRAVENOUS | Status: DC | PRN
  Administered 2021-04-14 – 2021-04-15 (×4): 10 mg via INTRAVENOUS
  Filled 2021-04-14 (×4): qty 4

## 2021-04-14 NOTE — Progress Notes (Signed)
° °  Echocardiogram 2D Echocardiogram has been performed.  Miguel Mccullough 04/14/2021, 9:08 AM

## 2021-04-14 NOTE — Progress Notes (Addendum)
STROKE TEAM PROGRESS NOTE   SUBJECTIVE (INTERVAL HISTORY) Patient is seen alone at the bedside.  Overall his condition is improved. Reports L sided weakness and facial droop, as well as dysarthria prior to admission, with improvements to dysarthria this AM.   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 Follow-up CTH unchanged. MRI redemonstrates stable hematoma.   OBJECTIVE CBC:  Recent Labs  Lab 04/13/21 2123 04/13/21 2128  WBC 7.9  --   NEUTROABS 5.1  --   HGB 14.4 14.6  HCT 41.9 43.0  MCV 85.7  --   PLT 191  --    Basic Metabolic Panel:  Recent Labs  Lab 04/13/21 2123 04/13/21 2128  NA 138 141  K 3.2* 3.2*  CL 99 100  CO2 27  --   GLUCOSE 105* 104*  BUN 18 24*  CREATININE 2.05* 2.10*  CALCIUM 8.9  --    Lipid Panel:  Recent Labs  Lab 04/13/21 2247  CHOL 203*  TRIG 248*  HDL 59  CHOLHDL 3.4  VLDL 50*  LDLCALC 94   HgbA1c:  Recent Labs  Lab 04/13/21 2123  HGBA1C 4.7*   Urine Drug Screen:  Recent Labs  Lab 04/14/21 0645  LABOPIA NONE DETECTED  COCAINSCRNUR NONE DETECTED  LABBENZ NONE DETECTED  AMPHETMU NONE DETECTED  THCU NONE DETECTED  LABBARB NONE DETECTED    Alcohol Level  Recent Labs  Lab 04/13/21 2123  ETH <10    IMAGING past 24 hours CT HEAD WO CONTRAST (5MM)  Result Date: 04/14/2021 CLINICAL DATA:  Hemorrhage follow-up EXAM: CT HEAD WITHOUT CONTRAST TECHNIQUE: Contiguous axial images were obtained from the base of the skull through the vertex without intravenous contrast. RADIATION DOSE REDUCTION: This exam was performed according to the departmental dose-optimization program which includes automated exposure control, adjustment of the mA and/or kV according to patient size and/or use of iterative reconstruction technique. COMPARISON:  04/13/2021 FINDINGS: Brain: Unchanged size of intraparenchymal hematoma centered in the right basal ganglia with extension into the right lateral ventricle. No  hydrocephalus. 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 Vascular: No hyperdense vessel or unexpected calcification. Skull: Normal. Negative for fracture or focal lesion. Sinuses/Orbits: No acute finding. Other: None. IMPRESSION: Unchanged size of intraparenchymal hematoma centered in the right basal ganglia with intraventricular extension. No hydrocephalus. Electronically Signed   By: Ulyses Jarred M.D.   On: 04/14/2021 03:58   MR BRAIN WO CONTRAST  Result Date: 04/14/2021 CLINICAL DATA:  Stroke follow-up EXAM: MRI HEAD WITHOUT CONTRAST TECHNIQUE: Multiplanar, multiecho pulse sequences of the brain and surrounding structures were obtained without intravenous contrast. COMPARISON:  Head CT from earlier today FINDINGS: Brain: Known acute hemorrhage in the right basal ganglia with intraventricular extension. No detected progression given cross modality differences. No hydrocephalus. There have been other prior microhemorrhages in the white matter, cerebellum, and brainstem, likely hypertensive. Gliosis in the pons and cerebral white matter which is extensive, with lacune at the left centrum semiovale and remote perforator infarct at the right basal ganglia. Constellation of findings correlate with chart history of hypertension. Brain volume is normal. Accounting for susceptibility artifact no acute infarct is seen adjacent to the hematoma. No gross mass lesion either. Vascular: Preceding CTA. Skull and upper cervical spine: Negative Sinuses/Orbits: Negative IMPRESSION: 1. Right basal ganglia hematoma with intraventricular extension that is stable from prior head CT. No hydrocephalus. 2. Hemorrhage location and background small vessel ischemic gliosis with chronic microhemorrhages suggests microvascular cause. Electronically Signed   By:  Jorje Guild M.D.   On: 04/14/2021 10:43   ECHOCARDIOGRAM COMPLETE  Result Date: 04/14/2021    ECHOCARDIOGRAM REPORT   Patient Name:   Miguel Mccullough Date of  Exam: 04/14/2021 Medical Rec #:  [id number]     Height: Accession #:    [id number]    Weight:       177.2 lb Date of Birth:  11-Aug-1965      BSA:          1.863 m Patient Age:    56 years      BP:           151/88 mmHg Patient Gender: M             HR:           102 bpm. Exam Location:  Inpatient Procedure: 2D Echo, Cardiac Doppler and Color Doppler Indications:    I63.9 STROKE  History:        Patient has no prior history of Echocardiogram examinations.                 Risk Factors:Hypertension.  Sonographer:    Beryle Beams Referring Phys: [id number] ASHISH ARORA IMPRESSIONS  1. Left ventricular ejection fraction, by estimation, is 70 to 75%. The left ventricle has hyperdynamic function. The left ventricle has no regional wall motion abnormalities. There is severe concentric left ventricular hypertrophy. Left ventricular diastolic parameters are consistent with Grade II diastolic dysfunction (pseudonormalization).  2. Right ventricular systolic function is normal. The right ventricular size is normal.  3. Left atrial size was mild to moderately dilated.  4. The mitral valve is normal in structure. Trivial mitral valve regurgitation. No evidence of mitral stenosis.  5. The aortic valve is tricuspid. There is mild thickening of the aortic valve. Aortic valve regurgitation is not visualized. Aortic valve sclerosis is present, with no evidence of aortic valve stenosis.  6. The inferior vena cava is normal in size with <50% respiratory variability, suggesting right atrial pressure of 8 mmHg.  7. Given severe LVH which may be related to hypertensive heart disease, would also consider CMR to assess for possible HCM. Comparison(s): No prior Echocardiogram. Conclusion(s)/Recommendation(s): No intracardiac source of embolism detected on this transthoracic study. Consider a transesophageal echocardiogram to exclude cardiac source of embolism if clinically indicated. FINDINGS  Left Ventricle: Left ventricular ejection fraction, by  estimation, is 70 to 75%. The left ventricle has hyperdynamic function. The left ventricle has no regional wall motion abnormalities. The left ventricular internal cavity size was normal in size. There is severe concentric left ventricular hypertrophy. Left ventricular diastolic parameters are consistent with Grade II diastolic dysfunction (pseudonormalization). Right Ventricle: The right ventricular size is normal. Right vetricular wall thickness was not well visualized. Right ventricular systolic function is normal. Left Atrium: Left atrial size was mild to moderately dilated. Right Atrium: Right atrial size was normal in size. Pericardium: There is no evidence of pericardial effusion. Mitral Valve: The mitral valve is normal in structure. There is mild thickening of the mitral valve leaflet(s). Trivial mitral valve regurgitation. No evidence of mitral valve stenosis. Tricuspid Valve: The tricuspid valve is normal in structure. Tricuspid valve regurgitation is trivial. Aortic Valve: The aortic valve is tricuspid. There is mild thickening of the aortic valve. Aortic valve regurgitation is not visualized. Aortic valve sclerosis is present, with no evidence of aortic valve stenosis. Aortic valve mean gradient measures 12.0 mmHg. Aortic valve peak gradient measures 23.8 mmHg. Aortic valve area, by VTI measures  1.44 cm. Pulmonic Valve: The pulmonic valve was normal in structure. Pulmonic valve regurgitation is trivial. Aorta: The aortic root is normal in size and structure. Venous: The inferior vena cava is normal in size with less than 50% respiratory variability, suggesting right atrial pressure of 8 mmHg. IAS/Shunts: The atrial septum is grossly normal.  LEFT VENTRICLE PLAX 2D LVIDd:         4.10 cm      Diastology LVIDs:         2.20 cm      LV e' medial:    7.58 cm/s LV PW:         1.30 cm      LV E/e' medial:  15.2 LV IVS:        1.50 cm      LV e' lateral:   8.21 cm/s LVOT diam:     1.70 cm      LV E/e'  lateral: 14.0 LV SV:         49 LV SV Index:   27 LVOT Area:     2.27 cm  LV Volumes (MOD) LV vol d, MOD A2C: 98.4 ml LV vol d, MOD A4C: 108.0 ml LV vol s, MOD A2C: 44.4 ml LV vol s, MOD A4C: 51.0 ml LV SV MOD A2C:     54.0 ml LV SV MOD A4C:     108.0 ml LV SV MOD BP:      62.6 ml RIGHT VENTRICLE             IVC RV S prime:     20.50 cm/s  IVC diam: 1.90 cm RVOT diam:      2.00 cm TAPSE (M-mode): 2.9 cm LEFT ATRIUM             Index        RIGHT ATRIUM           Index LA diam:        4.30 cm 2.31 cm/m   RA Area:     14.20 cm LA Vol (A2C):   73.3 ml 39.34 ml/m  RA Volume:   35.60 ml  19.11 ml/m LA Vol (A4C):   68.7 ml 36.87 ml/m LA Biplane Vol: 74.4 ml 39.93 ml/m  AORTIC VALVE                     PULMONIC VALVE AV Area (Vmax):    1.43 cm      PV Vmax:       0.69 m/s AV Area (Vmean):   1.42 cm      PV Vmean:      43.200 cm/s AV Area (VTI):     1.44 cm      PV VTI:        0.107 m AV Vmax:           244.00 cm/s   PV Peak grad:  1.9 mmHg AV Vmean:          160.000 cm/s  PV Mean grad:  1.0 mmHg AV VTI:            0.343 m AV Peak Grad:      23.8 mmHg AV Mean Grad:      12.0 mmHg LVOT Vmax:         154.00 cm/s LVOT Vmean:        100.000 cm/s LVOT VTI:          0.218 m LVOT/AV VTI ratio: 0.64  AORTA  Ao Root diam: 2.50 cm Ao Asc diam:  2.80 cm MITRAL VALVE MV Area (PHT): 4.63 cm     SHUNTS MV Decel Time: 164 msec     Systemic VTI:  0.22 m MR Peak grad: 51.6 mmHg     Systemic Diam: 1.70 cm MR Mean grad: 33.0 mmHg     Pulmonic Diam: 2.00 cm MR Vmax:      359.00 cm/s MR Vmean:     265.0 cm/s MV E velocity: 115.00 cm/s MV A velocity: 78.30 cm/s MV E/A ratio:  1.47 Gwyndolyn Kaufman MD Electronically signed by Gwyndolyn Kaufman MD Signature Date/Time: 04/14/2021/11:27:08 AM    Final    CT HEAD CODE STROKE WO CONTRAST  Result Date: 04/13/2021 CLINICAL DATA:  Code stroke. EXAM: CT HEAD WITHOUT CONTRAST TECHNIQUE: Contiguous axial images were obtained from the base of the skull through the vertex without intravenous  contrast. RADIATION DOSE REDUCTION: This exam was performed according to the departmental dose-optimization program which includes automated exposure control, adjustment of the mA and/or kV according to patient size and/or use of iterative reconstruction technique. COMPARISON:  None. FINDINGS: Brain: Right basal ganglia intraparenchymal hematoma measures 2.6 x 1.7 x 3.0 cm. There is intraventricular extension of blood. No hydrocephalus. Mass effect on the right lateral ventricle without midline shift. Old right corpus striatum small vessel infarct. Small focus of hyperdensity at the anterior right temporal lobe. Vascular: No abnormal hyperdensity of the major intracranial arteries or dural venous sinuses. No intracranial atherosclerosis. Skull: The visualized skull base, calvarium and extracranial soft tissues are normal. Sinuses/Orbits: No fluid levels or advanced mucosal thickening of the visualized paranasal sinuses. No mastoid or middle ear effusion. The orbits are normal. IMPRESSION: 1. Right basal ganglia intraparenchymal hematoma (2.6 x 1.7 x 3.0 cm) with intraventricular extension of blood. No hydrocephalus. 2. Small focus of hyperdensity at the anterior right temporal lobe, possibly a small amount of subarachnoid hemorrhage or a cavernous malformation. Critical Value/emergent results were called by telephone at the time of interpretation on 04/13/2021 at 9:43 pm to provider [facility] , who verbally acknowledged these results. Electronically Signed   By: Ulyses Jarred M.D.   On: 04/13/2021 21:43   CT ANGIO HEAD NECK W WO CM (CODE STROKE)  Result Date: 04/13/2021 CLINICAL DATA:  Intracranial hemorrhage EXAM: CT ANGIOGRAPHY HEAD AND NECK TECHNIQUE: Multidetector CT imaging of the head and neck was performed using the standard protocol during bolus administration of intravenous contrast. Multiplanar CT image reconstructions and MIPs were obtained to evaluate the vascular anatomy. Carotid stenosis  measurements (when applicable) are obtained utilizing NASCET criteria, using the distal internal carotid diameter as the denominator. RADIATION DOSE REDUCTION: This exam was performed according to the departmental dose-optimization program which includes automated exposure control, adjustment of the mA and/or kV according to patient size and/or use of iterative reconstruction technique. CONTRAST:  6mL OMNIPAQUE IOHEXOL 350 MG/ML SOLN COMPARISON:  None. FINDINGS: CTA NECK FINDINGS SKELETON: There is no bony spinal canal stenosis. No lytic or blastic lesion. OTHER NECK: Normal pharynx, larynx and major salivary glands. No cervical lymphadenopathy. Left hemithyroidectomy. UPPER CHEST: No pneumothorax or pleural effusion. No nodules or masses. AORTIC ARCH: There is no calcific atherosclerosis of the aortic arch. There is no aneurysm, dissection or hemodynamically significant stenosis of the visualized portion of the aorta. Conventional 3 vessel aortic branching pattern. The visualized proximal subclavian arteries are widely patent. RIGHT CAROTID SYSTEM: Normal without aneurysm, dissection or stenosis. LEFT CAROTID SYSTEM: Normal without aneurysm, dissection or stenosis.  VERTEBRAL ARTERIES: Left dominant configuration. Both origins are clearly patent. There is no dissection, occlusion or flow-limiting stenosis to the skull base (V1-V3 segments). CTA HEAD FINDINGS POSTERIOR CIRCULATION: --Vertebral arteries: Normal V4 segments. --Inferior cerebellar arteries: Normal. --Basilar artery: Normal. --Superior cerebellar arteries: Normal. --Posterior cerebral arteries (PCA): Normal. ANTERIOR CIRCULATION: --Intracranial internal carotid arteries: Normal. --Anterior cerebral arteries (ACA): Normal. Both A1 segments are present. Patent anterior communicating artery (a-comm). --Middle cerebral arteries (MCA): Normal. VENOUS SINUSES: As permitted by contrast timing, patent. ANATOMIC VARIANTS: None Review of the MIP images confirms  the above findings. IMPRESSION: 1. No aneurysm, dissection, occlusion or hemodynamically significant stenosis of the major cervical or intracranial arteries. Electronically Signed   By: Ulyses Jarred M.D.   On: 04/13/2021 21:37     PHYSICAL EXAM Temp:  [98.1 F (36.7 C)-98.5 F (36.9 C)] 98.5 F (36.9 C) (02/27 1200) Pulse Rate:  [85-116] 88 (02/27 1230) Resp:  [16-34] 22 (02/27 1230) BP: (101-215)/(57-148) 101/62 (02/27 1230) SpO2:  [90 %-98 %] 94 % (02/27 1230) Weight:  [80.4 kg] 80.4 kg (02/26 2131)  General - Well nourished, well developed, age congruent male in no apparent distress.  Cardiovascular - Regular rhythm and rate on telemetry.  Mental Status -  Level of arousal and orientation to time, place, and person were intact. Language including expression, naming, repetition, comprehension was assessed and found intact, only with mild dysarthria Attention span and concentration were normal. Recent and remote memory were intact. 3/3 recall Fund of Knowledge was assessed and was intact.  Cranial Nerves II - XII - II - Visual field intact OU. III, IV, VI - Extraocular movements intact. V - Facial sensation intact bilaterally. VII - Facial movement with L facial droop VIII - Hearing & vestibular intact bilaterally. X - Palate elevates symmetrically. XII - Tongue protrusion with L deviation.  Motor Strength - The patients strength was normal in RU and RL extremities and pronator drift was absent. 2/5 LLE and 0/5 flaccid LUE. Bulk was normal and fasciculations were absent.   Motor Tone - Muscle tone was assessed at the neck and appendages and was normal.  Sensory - Light touch was assessed and symmetrical.    Coordination - The patient had normal movements in the R hand and RLE with no ataxia or dysmetria.  Tremor was absent.  Gait and Station - deferred.    ASSESSMENT/PLAN [name] is a 56 y.o. male with history of HTN presenting with L sided weakness,  numbness, and facial droop and dysarthria. 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  CTA head & neck No vessel stenosis, occlusion, aneurysm, or dissection.  Follow-up CTH stable IPH. MRI  R basal ganglia hematoma with intraventricular extension; stable from follow-up CT. Background small vessel ischemic gliosis with chronic microhemorrhages. 2D Echo LVEF 70-75%, severe LVH, Grade II diastolic dysfunction, LA mild to mod dilated, trivial MVR, Tricuspid aortic valve with mild thickening.  LDL 94 HgbA1c 4.7% VTE prophylaxis - SCDs    Diet   Diet NPO time specified   No antithrombotic prior to admission, now on No antithrombotic. Will not initiate therapy until patient 5-7 days post-IPH, pending hematoma remains stable. Therapy recommendations:  Pending PT/OT recs; needs continued SLP follow-up Disposition:  Pending  Hypertensive Emergency Hypertensive Encephalopathy Home meds:  Non-compliant Unstable Patient SBP > 150, despite maximal Cleviprex dose (32). Added Labetalol 10  mg q2h PRN for SBP >150. Goal SBP 130-150 Long-term BP goal normotensive  Hyperlipidemia Home meds:  N/A LDL 94., goal < 70 Will add Atorvastatin 80 mg High intensity statin indicated  Continue statin at discharge  Diabetes type II Assessment Home meds:  N/A HgbA1c 4.7, goal < 7.0 CBGs Recent Labs    04/13/21 2113  GLUCAP 104*    Other Stroke Risk Factors ETOH use, alcohol level <10, advised to drink no more than 1-2 drink(s) a day Substance abuse - UDS:  THC NONE DETECTED, Cocaine NONE DETECTED.  Obesity, There is no height or weight on file to calculate BMI., BMI >/= 30 associated with increased stroke risk, recommend weight loss, diet and exercise as appropriate   Other Active Problems Severe LVH: recommend to follow-up outpatient with cardiology to assess for  HCM. Will submit referral.   Hospital day # 1    Rosezetta Schlatter, MD Stroke Neurology- Neuro Psych Resident 04/14/2021 1:52 PM  ATTENDING ATTESTATION:  56 year old past medical history of recurrent hypertension noncompliant medications presenting with left-sided numbness facial droop slurred speech.  CT shows right basal ganglia hemorrhage with intraventricular extension.  Repeat head CT from 4 AM this morning is stable.  MRI today 10 in the morning shows right basal ganglia hematoma and IVH extension and is stable.  No hydrocephalus.  We will need to monitor blood pressure carefully.  Exam consists of dysarthria and left-sided weakness as above.  Dr. Reeves Forth evaluated pt independently, reviewed imaging, chart, labs. Discussed and formulated plan with the APP. Please see APP note above for details.     This patient is critically ill due to respiratory distress, ICH and at significant risk of neurological worsening, death form heart failure, respiratory failure, recurrent stroke, bleeding from [facility], seizure, sepsis. This patient's care requires constant monitoring of vital signs, hemodynamics, respiratory and cardiac monitoring, review of multiple databases, neurological assessment, discussion with family, other specialists and medical decision making of high complexity. I spent 35 minutes of neurocritical care time in the care of this patient.   Malak Orantes,MD    To contact Stroke Continuity provider, please refer to [email or web address] After hours, contact General Neurology

## 2021-04-14 NOTE — Evaluation (Signed)
Speech Language Pathology Evaluation Patient Details Name: Miguel Mccullough MRN: [id number] DOB: 09-25-1965 Today's Date: 04/14/2021 Time: 6387-5643 SLP Time Calculation (min) (ACUTE ONLY): 18 min  Problem List:  Patient Active Problem List   Diagnosis Date Noted   ICH (intracerebral hemorrhage) ([place]) 04/13/2021   Past Medical History:  Past Medical History:  Diagnosis Date   Hypertension    Past Surgical History: History reviewed. No pertinent surgical history. HPI:  Pt is a 56 y.o. male who presented to the ED for evaluation of sudden onset of left-sided weakness, left facial droop and slurred speech along with left-sided numbness. 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 No hydrocephalus. Small focus of hyperdensity at the anterior right temporal lobe, possibly a small amount of subarachnoid hemorrhage or a cavernous  malformation. PMH: hypertension.   Assessment / Plan / Recommendation Clinical Impression  Pt participated in speech-language-cognition evaluation with his wife and daughter present for part of the evaluation. All parties denied the pt having any baseline deficits in these areas. Pt stated that he works for a [employer] and has a Engineer, civil (consulting). Pt's wife stated that there is currently "some confusion" Pt initially denied any acute changed in cognition but, at the end of the evaluation, he endorsed having some difficulty with more complex tasks. Pt and his family described the pt's speech as "slurred" and stated that it is now approximately 50% back to baseline. The [facility] Mental Status Examination was completed to evaluate the pt's cognitive-linguistic skills. He achieved a score of 16/30 which is below the normal limits of 27 or more out of 30. He exhibited difficulty in the areas of awareness, attention, memory, temporal orientation, complex problem solving, and executive function. He also  presented with mild dysarthria characterized by reduced articulatory precision and reduced vocal intensity which intermittently impacted speech intelligibility at the conversational level. Skilled SLP services are clinically indicated at this time to improve motor speech and cognitive-linguistic function.    SLP Assessment  SLP Recommendation/Assessment: Patient needs continued Speech Lanaguage Pathology Services SLP Visit Diagnosis: Cognitive communication deficit (R41.841);Dysarthria and anarthria (R47.1)    Recommendations for follow up therapy are one component of a multi-disciplinary discharge planning process, led by the attending physician.  Recommendations may be updated based on patient status, additional functional criteria and insurance authorization.    Follow Up Recommendations   (Continued SLP services at level of care recommended by PT/OT)    Assistance Recommended at Discharge  Intermittent Supervision/Assistance  Functional Status Assessment Patient has had a recent decline in their functional status and demonstrates the ability to make significant improvements in function in a reasonable and predictable amount of time.  Frequency and Duration min 2x/week  2 weeks      SLP Evaluation Cognition  Overall Cognitive Status: Impaired/Different from baseline Arousal/Alertness: Awake/alert Orientation Level: Oriented to person;Oriented to place;Oriented to situation (partially oriented to time) Year: 2023 (with self-correction, "19.Marland KitchenMarland KitchenI mean 2003.Marland KitchenMarland KitchenI mean 2023") Month: February Day of Week: Incorrect (Tuesday, February 24th) Attention: Sustained;Focused Focused Attention: Impaired Focused Attention Impairment: Verbal complex Sustained Attention: Impaired Sustained Attention Impairment: Verbal complex Memory: Impaired Memory Impairment: Retrieval deficit;Decreased recall of new information (Immediate: 5/5 with repetition x3; delayed: 3/5; with cues: 2/2) Awareness:  Impaired Awareness Impairment: Emergent impairment Problem Solving: Impaired Problem Solving Impairment: Verbal complex (Money: 0/3; Time: 1/1 with cues) Executive Function: Sequencing;Organizing Sequencing: Impaired Sequencing Impairment: Verbal complex (Clock: 2/2) Organizing: Appears intact (backward digit span: 3/3 with  additional processing time.)       Comprehension  Auditory Comprehension Overall Auditory Comprehension: Appears within functional limits for tasks assessed Yes/No Questions: Within Functional Limits Commands: Within Functional Limits Conversation: Complex    Expression Expression Primary Mode of Expression: Verbal Verbal Expression Overall Verbal Expression: Appears within functional limits for tasks assessed Initiation: No impairment Level of Generative/Spontaneous Verbalization: Conversation Repetition: No impairment Naming: No impairment   Oral / Motor  Oral Motor/Sensory Function Overall Oral Motor/Sensory Function: Mild impairment Facial ROM: Reduced left;Suspected CN VII (facial) dysfunction Facial Symmetry: Abnormal symmetry left;Suspected CN VII (facial) dysfunction Facial Strength: Reduced left;Suspected CN VII (facial) dysfunction Facial Sensation: Within Functional Limits Lingual ROM: Reduced left;Suspected CN XII (hypoglossal) dysfunction Lingual Symmetry: Abnormal symmetry left;Suspected CN XII (hypoglossal) dysfunction Lingual Strength: Reduced;Suspected CN XII (hypoglossal) dysfunction Motor Speech Overall Motor Speech: Impaired Respiration: Within functional limits Phonation: Low vocal intensity Resonance: Within functional limits Articulation: Impaired Level of Impairment: Conversation Intelligibility: Intelligibility reduced Word: 75-100% accurate Phrase: 75-100% accurate Sentence: 75-100% accurate Conversation: 75-100% accurate Motor Planning: Witnin functional limits Motor Speech Errors: Aware           Yuko Coventry I. Hardin Negus,  [place], [place] Office number [phone] Pager [place] 04/14/2021, 10:14 AM

## 2021-04-14 NOTE — Progress Notes (Signed)
PT Cancellation Note  Patient Details Name: Miguel Mccullough MRN: [id number] DOB: 1965/06/05   Cancelled Treatment:    Reason Eval/Treat Not Completed: Patient not medically ready;Active bedrest order - will check back tomorrow.  Stacie Glaze, PT DPT Acute Rehabilitation Services Pager [phone]  Office [phone]    Louis Matte 04/14/2021, 12:01 PM

## 2021-04-14 NOTE — Progress Notes (Signed)
°  Transition of Care [facility]) Screening Note   Patient Details  Name: Miguel Mccullough Date of Birth: [birth year]   Transition of Care [facility]) CM/SW Contact:    Ella Bodo, RN Phone Number: 04/14/2021, 9:52 AM    Transition of Care Department [facility]) has reviewed patient and no TOC needs have been identified at this time. We will continue to monitor patient advancement through interdisciplinary progression rounds. If new patient transition needs arise, please place a TOC consult.  Reinaldo Raddle, RN, BSN  Trauma/Neuro ICU Case Manager [phone]

## 2021-04-14 NOTE — Evaluation (Signed)
Clinical/Bedside Swallow Evaluation Patient Details  Name: Miguel Mccullough MRN: [id number] Date of Birth: [birth year]  Today's Date: 04/14/2021 Time: SLP Start Time (ACUTE ONLY): 0909 SLP Stop Time (ACUTE ONLY): 0924 SLP Time Calculation (min) (ACUTE ONLY): 15 min  Past Medical History:  Past Medical History:  Diagnosis Date   Hypertension    Past Surgical History: History reviewed. No pertinent surgical history. HPI:  Pt is a 56 y.o. male who presented to the ED for evaluation of sudden onset of left-sided weakness, left facial droop and slurred speech along with left-sided numbness. 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 No hydrocephalus. Small focus of hyperdensity at the anterior right temporal lobe, possibly a small amount of subarachnoid hemorrhage or a cavernous  malformation. PMH: hypertension.    Assessment / Plan / Recommendation  Clinical Impression  Pt was seen for bedside swallow evaluation and he denied a history of dysphagia. Oral mechanism exam was significant for lingual weakness, and reduced left-sided facial ROM. Dentition was adequate and natural. Mastication was [facility] and pt's swallow subjectively appeared timely. Pt demonstrated intermittent throat clearing with all consistencies. Throat clearing was also noted at baseline, but frequency appeared increased during p.o. intake. Pt and his family reported that the pt does clear his throat frequently, but pt stated that he believes that it was more pronounced during p.o. intake than at baseline. A modified barium swallow study is recommended to further assess swallow function. Pt may have ice chips until it is completed and meds may be provide while in puree. SLP Visit Diagnosis: Dysphagia, unspecified (R13.10)    Aspiration Risk       Diet Recommendation NPO except meds   Medication Administration: Whole meds with puree    Other  Recommendations Oral Care  Recommendations: Oral care BID    Recommendations for follow up therapy are one component of a multi-disciplinary discharge planning process, led by the attending physician.  Recommendations may be updated based on patient status, additional functional criteria and insurance authorization.  Follow up Recommendations  (Continued SLP services at level of care recommended by PT/OT)      Assistance Recommended at Discharge    Functional Status Assessment Patient has had a recent decline in their functional status and demonstrates the ability to make significant improvements in function in a reasonable and predictable amount of time.  Frequency and Duration min 2x/week  2 weeks       Prognosis Prognosis for Safe Diet Advancement: Good      Swallow Study   General Date of Onset: 04/13/21 HPI: Pt is a 56 y.o. male who presented to the ED for evaluation of sudden onset of left-sided weakness, left facial droop and slurred speech along with left-sided numbness. 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 No hydrocephalus. Small focus of hyperdensity at the anterior right temporal lobe, possibly a small amount of subarachnoid hemorrhage or a cavernous  malformation. PMH: hypertension. Type of Study: Bedside Swallow Evaluation Previous Swallow Assessment: none Diet Prior to this Study: NPO Temperature Spikes Noted: No Respiratory Status: Nasal cannula History of Recent Intubation: No Behavior/Cognition: Alert;Cooperative;Pleasant mood Oral Cavity Assessment: Within Functional Limits Oral Care Completed by SLP: No Oral Cavity - Dentition: Adequate natural dentition Vision: Functional for self-feeding Self-Feeding Abilities: Able to feed self Patient Positioning: Upright in bed;Postural control adequate for testing Baseline Vocal Quality: Normal Volitional Cough: Strong Volitional Swallow: Able to elicit    Oral/Motor/Sensory  Function Overall Oral Motor/Sensory Function: Mild impairment Facial ROM: Reduced left;Suspected CN VII (facial) dysfunction Facial Symmetry: Abnormal symmetry left;Suspected CN VII (facial) dysfunction Facial Strength: Reduced left;Suspected CN VII (facial) dysfunction Facial Sensation: Within Functional Limits Lingual ROM: Reduced left;Suspected CN XII (hypoglossal) dysfunction Lingual Symmetry: Abnormal symmetry left;Suspected CN XII (hypoglossal) dysfunction Lingual Strength: Reduced;Suspected CN XII (hypoglossal) dysfunction   Ice Chips Ice chips: Impaired Presentation: Spoon Pharyngeal Phase Impairments: Throat Clearing - Immediate;Throat Clearing - Delayed   Thin Liquid Thin Liquid: Impaired Presentation: Straw Pharyngeal  Phase Impairments: Throat Clearing - Immediate;Throat Clearing - Delayed    Nectar Thick Nectar Thick Liquid: Not tested   Honey Thick Honey Thick Liquid: Not tested   Puree Puree: Within functional limits Presentation: Spoon   Solid     Solid: Impaired Pharyngeal Phase Impairments: Throat Clearing - Delayed     Cera Rorke I. Hardin Negus, [place], [place] Office number [phone] Pager [phone]  Horton Marshall 04/14/2021,9:53 AM

## 2021-04-14 NOTE — Progress Notes (Signed)
Modified Barium Swallow Progress Note  Patient Details  Name: Miguel Mccullough MRN: [id number] Date of Birth: 09-Aug-1965  Today's Date: 04/14/2021  Modified Barium Swallow completed.  Full report located under Chart Review in the Imaging Section.  Brief recommendations include the following:  Clinical Impression  Pt presents with mild oropharyngeal dysphagia characterized by reduced bolus cohesion, and a pharyngeal delay. He exhibited premature spillage to the valleculae and pyriform sinuses. With liquids, the swallow was triggered with ~50% of the bolus at the level of the pyriform sinuses. Penetration (PAS 3) was noted with the barium tablet which was swallowed with thin liquids via straw. Coughing was noted thereafter; aspiration of the penetrate is suspected, but cannot be conclusively determined since pt moved out of the visual field and no aspirate was noted after coughing. Throat clearing was demonstrated throughout the study, but this did not align with any instances of laryngeal invasion. A regular texture diet with thin liquids is recommended at this time. SLP will continue to follow pt.   Swallow Evaluation Recommendations       SLP Diet Recommendations: Regular solids;Thin liquid   Liquid Administration via: Cup;Straw   Medication Administration: Whole meds with puree   Supervision: Patient able to self feed   Compensations: Slow rate;Small sips/bites   Postural Changes: Seated upright at 90 degrees   Oral Care Recommendations: Oral care BID      Bricyn Labrada I. Hardin Negus, [place], [place] Office number [phone] Pager [place] 04/14/2021,4:02 PM

## 2021-04-14 NOTE — Progress Notes (Signed)
Repeat CTH stable.  -- Amie [place], MD Neurologist [facility] Pager: [phone]

## 2021-04-14 NOTE — Progress Notes (Signed)
OT Cancellation Note  Patient Details Name: Miguel Mccullough MRN: [id number] DOB: 11/20/1965   Cancelled Treatment:    Reason Eval/Treat Not Completed: Medical issues which prohibited therapy (RN reports hold. Will return as schedule allows.)  [place], OTR/L Acute Rehab Pager: [phone] Office: [phone] 04/14/2021, 10:13 AM

## 2021-04-15 LAB — CBC
HCT: 39.8 % (ref 39.0–52.0)
Hemoglobin: 13.7 g/dL (ref 13.0–17.0)
MCH: 29.5 pg (ref 26.0–34.0)
MCHC: 34.4 g/dL (ref 30.0–36.0)
MCV: 85.8 fL (ref 80.0–100.0)
Platelets: 209 10*3/uL (ref 150–400)
RBC: 4.64 MIL/uL (ref 4.22–5.81)
RDW: 13.4 % (ref 11.5–15.5)
WBC: 13.6 10*3/uL — ABNORMAL HIGH (ref 4.0–10.5)
nRBC: 0 % (ref 0.0–0.2)

## 2021-04-15 LAB — BASIC METABOLIC PANEL
Anion gap: 14 (ref 5–15)
BUN: 24 mg/dL — ABNORMAL HIGH (ref 6–20)
CO2: 23 mmol/L (ref 22–32)
Calcium: 8.6 mg/dL — ABNORMAL LOW (ref 8.9–10.3)
Chloride: 99 mmol/L (ref 98–111)
Creatinine, Ser: 2.73 mg/dL — ABNORMAL HIGH (ref 0.61–1.24)
GFR, Estimated: 27 mL/min — ABNORMAL LOW (ref >60)
Glucose, Bld: 122 mg/dL — ABNORMAL HIGH (ref 70–99)
Potassium: 3.1 mmol/L — ABNORMAL LOW (ref 3.5–5.1)
Sodium: 136 mmol/L (ref 135–145)

## 2021-04-15 LAB — MAGNESIUM: Magnesium: 2.3 mg/dL (ref 1.7–2.4)

## 2021-04-15 LAB — TRIGLYCERIDES: Triglycerides: 278 mg/dL — ABNORMAL HIGH (ref <150)

## 2021-04-15 MED ORDER — POTASSIUM CHLORIDE 10 MEQ/100ML IV SOLN
10.0000 meq | INTRAVENOUS | Status: AC
  Administered 2021-04-15 (×4): 10 meq via INTRAVENOUS
  Filled 2021-04-15 (×4): qty 100

## 2021-04-15 MED ORDER — CLONIDINE HCL 0.1 MG PO TABS
0.1000 mg | ORAL_TABLET | Freq: Two times a day (BID) | ORAL | Status: DC
  Administered 2021-04-15 (×2): 0.1 mg via ORAL
  Filled 2021-04-15 (×2): qty 1

## 2021-04-15 MED ORDER — ATORVASTATIN CALCIUM 40 MG PO TABS
40.0000 mg | ORAL_TABLET | Freq: Every day | ORAL | Status: DC
Start: 2021-04-15 — End: 2021-04-17
  Administered 2021-04-15 – 2021-04-16 (×2): 40 mg via ORAL
  Filled 2021-04-15 (×3): qty 1

## 2021-04-15 MED ORDER — AMLODIPINE BESYLATE 10 MG PO TABS
10.0000 mg | ORAL_TABLET | Freq: Every day | ORAL | Status: DC
  Administered 2021-04-15 – 2021-04-17 (×3): 10 mg via ORAL
  Filled 2021-04-15 (×3): qty 1

## 2021-04-15 MED ORDER — METOPROLOL TARTRATE 50 MG PO TABS
50.0000 mg | ORAL_TABLET | Freq: Two times a day (BID) | ORAL | Status: DC
Start: 2021-04-15 — End: 2021-04-17
  Administered 2021-04-15 – 2021-04-17 (×5): 50 mg via ORAL
  Filled 2021-04-15 (×6): qty 1

## 2021-04-15 MED ORDER — PANTOPRAZOLE SODIUM 40 MG PO TBEC
40.0000 mg | DELAYED_RELEASE_TABLET | Freq: Every day | ORAL | Status: DC
  Administered 2021-04-15 – 2021-04-16 (×2): 40 mg via ORAL
  Filled 2021-04-15 (×3): qty 1

## 2021-04-15 NOTE — Evaluation (Signed)
Occupational Therapy Evaluation Patient Details Name: LETCHER Mccullough MRN: [id number] DOB: 03/09/1965 Today's Date: 04/15/2021   History of Present Illness Pt is 56 yo male who presents with L side weakness, L facial droop, and slurred speech. 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 PMH: HTN and covid   Clinical Impression   Pt admitted with the above diagnosis and has the deficits listed below. Pt would benefit from cont OT to increase independence with adls and adl transfers as well as cognition and perception so he can eventually return home with his wife after rehab.  Feel pt would be a great candidate for inpatient rehab to maximize his independence before returning home.  Will continue to follow with focus on sitting balance during adls and perception.       Recommendations for follow up therapy are one component of a multi-disciplinary discharge planning process, led by the attending physician.  Recommendations may be updated based on patient status, additional functional criteria and insurance authorization.   Follow Up Recommendations  Acute inpatient rehab (3hours/day)    Assistance Recommended at Discharge Frequent or constant Supervision/Assistance  Patient can return home with the following Two people to help with walking and/or transfers;A lot of help with bathing/dressing/bathroom;Assistance with cooking/housework;Direct supervision/assist for medications management;Direct supervision/assist for financial management;Assist for transportation    Functional Status Assessment  Patient has had a recent decline in their functional status and demonstrates the ability to make significant improvements in function in a reasonable and predictable amount of time.  Equipment Recommendations  Tub/shower bench;BSC/3in1;Other (comment) (tbd on rehab)    Recommendations for Other Services Rehab consult     Precautions / Restrictions Precautions Precautions:  Fall Restrictions Weight Bearing Restrictions: No      Mobility Bed Mobility                    Transfers                          Balance                                           ADL either performed or assessed with clinical judgement   ADL Overall ADL's : Needs assistance/impaired Eating/Feeding: Minimal assistance;Sitting   Grooming: Oral care;Minimal assistance;Sitting;Wash/dry face;Wash/dry hands Grooming Details (indicate cue type and reason): Pt does not tend to L side of body when grooming Upper Body Bathing: Moderate assistance;Sitting Upper Body Bathing Details (indicate cue type and reason): supported sitting.  No functional use of LLE. Lower Body Bathing: Maximal assistance;Sit to/from stand;Cueing for compensatory techniques;Cueing for safety Lower Body Bathing Details (indicate cue type and reason): Pt with heavy L lean and has difficulty finding midline Upper Body Dressing : Maximal assistance;Sitting   Lower Body Dressing: Maximal assistance;Sit to/from stand;Cueing for safety;Cueing for compensatory techniques Lower Body Dressing Details (indicate cue type and reason): assist sit to stand and assist to tend to L side of body when dressing Toilet Transfer: Maximal assistance;BSC/3in1;Squat-pivot Toilet Transfer Details (indicate cue type and reason): transfer to strong side. Toileting- Clothing Manipulation and Hygiene: Total assistance;Sit to/from stand;Cueing for compensatory techniques;Cueing for safety       Functional mobility during ADLs: Maximal assistance;+2 for physical assistance General ADL Comments: Pt significantly limited with all adls due to poor sitting balance, no functional use of L  side, L neglect, inability to stand without assist and poor cognition.     Vision Baseline Vision/History: 1 Wears glasses Ability to See in Adequate Light: 0 Adequate Patient Visual Report: No change from baseline Vision  Assessment?: Yes Eye Alignment: Within Functional Limits Ocular Range of Motion: Within Functional Limits Alignment/Gaze Preference: Gaze right Tracking/Visual Pursuits: Able to track stimulus in all quads without difficulty Convergence: Within functional limits Visual Fields: No apparent deficits Additional Comments: Pt with L neglect     Perception     Praxis      Pertinent Vitals/Pain       Hand Dominance Right   Extremity/Trunk Assessment Upper Extremity Assessment Upper Extremity Assessment: Defer to OT evaluation   Lower Extremity Assessment Lower Extremity Assessment: LLE deficits/detail LLE Deficits / Details: no active motion noted LLE, pt able to detect deep pressure, not light touch LLE Sensation: decreased light touch;decreased proprioception LLE Coordination: decreased fine motor;decreased gross motor   Cervical / Trunk Assessment Cervical / Trunk Assessment: Normal   Communication Communication Communication: No difficulties   Cognition     Overall Cognitive Status: Impaired/Different from baseline Area of Impairment: Attention, Following commands, Safety/judgement, Awareness, Problem solving                   Current Attention Level: Focused   Following Commands: Follows one step commands consistently, Follows multi-step commands inconsistently Safety/Judgement: Decreased awareness of safety, Decreased awareness of deficits Awareness: Intellectual Problem Solving: Slow processing, Decreased initiation       General Comments  BP 145/74, SPO2 95% on RA, HR 95 bpm after session. Pt fatigued after session. Falling asleep in recliner. Family present and educated on neglect and how to assist him    Exercises     Shoulder Instructions      Home Living Family/patient expects to be discharged to:: Private residence Living Arrangements: Spouse/significant other Available Help at Discharge: Available 24 hours/day Type of Home: House Home  Access: Stairs to enter [employer] of Steps: 5 Entrance Stairs-Rails: Right;Left Home Layout: Two level;Bed/bath upstairs;1/2 bath on main level Alternate Level Stairs-Number of Steps: 15 Alternate Level Stairs-Rails: Right Bathroom Shower/Tub: Occupational psychologist: Standard     Home Equipment: None   Additional Comments: Owns cleaning business  Lives With: Spouse;Daughter    Prior Functioning/Environment Prior Level of Function : Independent/Modified Independent                        OT Problem List: Decreased strength;Decreased range of motion;Impaired balance (sitting and/or standing);Impaired vision/perception;Decreased coordination;Decreased cognition;Decreased safety awareness;Decreased knowledge of use of DME or AE;Decreased knowledge of precautions;Impaired sensation;Impaired tone;Impaired UE functional use;Pain      OT Treatment/Interventions: Self-care/ADL training;Neuromuscular education;Therapeutic activities;Balance training;Visual/perceptual remediation/compensation;DME and/or AE instruction    OT Goals(Current goals can be found in the care plan section) Acute Rehab OT Goals Patient Stated Goal: get stronger OT Goal Formulation: With patient/family Time For Goal Achievement: 04/29/21 Potential to Achieve Goals: Good ADL Goals Pt Will Perform Grooming: with set-up;sitting Pt Will Perform Upper Body Dressing: with min assist;sitting Pt Will Perform Lower Body Dressing: with min assist;sit to/from stand Pt Will Transfer to Toilet: with min assist;squat pivot transfer;bedside commode Pt Will Perform Tub/Shower Transfer: Shower transfer;with mod assist;shower seat;grab bars Additional ADL Goal #1: Pt will tend to adls items in the L environment during adls session with mininal cues.  OT Frequency: Min 2X/week    Co-evaluation PT/OT/SLP Co-Evaluation/Treatment: Yes Reason for  Co-Treatment: Complexity of the patient's impairments  (multi-system involvement) PT goals addressed during session: Mobility/safety with mobility OT goals addressed during session: ADL's and self-care      AM-PAC OT "6 Clicks" Daily Activity     Outcome Measure Help from another person eating meals?: A Little Help from another person taking care of personal grooming?: A Lot Help from another person toileting, which includes using toliet, bedpan, or urinal?: A Lot Help from another person bathing (including washing, rinsing, drying)?: A Lot Help from another person to put on and taking off regular upper body clothing?: A Lot Help from another person to put on and taking off regular lower body clothing?: A Lot 6 Click Score: 13   End of Session Equipment Utilized During Treatment: Gait belt Nurse Communication: Mobility status  Activity Tolerance: Patient tolerated treatment well Patient left: in chair;with call bell/phone within reach;with family/visitor present  OT Visit Diagnosis: Other abnormalities of gait and mobility (R26.89);Unsteadiness on feet (R26.81);Hemiplegia and hemiparesis;Other symptoms and signs involving the nervous system (R29.898) Hemiplegia - Right/Left: Left Hemiplegia - dominant/non-dominant: Non-Dominant 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

## 2021-04-15 NOTE — Progress Notes (Signed)
Speech Language Pathology Treatment: Dysphagia  Patient Details Name: Miguel Mccullough MRN: [id number] DOB: 12/27/1965 Today's Date: 04/15/2021 Time: 0943-1000 SLP Time Calculation (min) (ACUTE ONLY): 17 min  Assessment / Plan / Recommendation Clinical Impression  Treatment focused on diet tolerance and use of compensatory strategies following MBS completed previous date. Patient mildly lethargic but participatory. Wife and brother present and reported no overt s/s of aspiration or complaints of difficulty. Patient requested applesauce and was able to self feed with moderate physical assistance to hold cup while self feeding with right hand. Mild left anterior labial spillage/residue present with no awareness. SLP provided verbal cueing for use of lingual sweep and wiping mouth intermittently during po intake. No overt s/s of aspiration with pos, Miguel when challenged with multiple consecutive straw sips of thin liquid. SLP will continue to f/u with focus on use of compensatory strategies as patient appears to be tolerating current diet well.    HPI HPI: Pt is a 56 y.o. male who presented to the ED for evaluation of sudden onset of left-sided weakness, left facial droop and slurred speech along with left-sided numbness. 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 No hydrocephalus. Small focus of hyperdensity at the anterior right temporal lobe, possibly a small amount of subarachnoid hemorrhage or a cavernous  malformation. PMH: hypertension.      SLP Plan  Continue with current plan of care      Recommendations for follow up therapy are one component of a multi-disciplinary discharge planning process, led by the attending physician.  Recommendations may be updated based on patient status, additional functional criteria and insurance authorization.    Recommendations  Diet recommendations: Regular;Thin liquid Liquids provided via:  Cup;Straw Medication Administration: Whole meds with puree Supervision: Staff to assist with self feeding;Intermittent supervision to cue for compensatory strategies Compensations: Slow rate;Small sips/bites;Monitor for anterior loss Postural Changes and/or Swallow Maneuvers: Seated upright 90 degrees                Oral Care Recommendations: Oral care BID Follow Up Recommendations: Acute inpatient rehab (3hours/day) SLP Visit Diagnosis: Dysphagia, oropharyngeal phase (R13.12) Plan: Continue with current plan of care         [facility] MA, CCC-SLP   Miguel Mccullough  04/15/2021, 10:04 AM

## 2021-04-15 NOTE — Progress Notes (Signed)
Inpatient Rehab Admissions Coordinator:  ° °Per therapy recommendation,  patient was screened for CIR candidacy by Myndi Wamble, MS, CCC-SLP. At this time, Pt. Appears to be a a potential candidate for CIR. I will place   order for rehab consult per protocol for full assessment. Please contact me any with questions. ° °Allyne Hebert, MS, CCC-SLP °Rehab Admissions Coordinator  °[phone] (celll) °[phone] (office) ° °

## 2021-04-15 NOTE — Evaluation (Signed)
Physical Therapy Evaluation Patient Details Name: Miguel Mccullough MRN: [id number] DOB: 1965-11-22 Today's Date: 04/15/2021  History of Present Illness  Pt is 56 yo male who presents with L side weakness, L facial droop, and slurred speech. 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 PMH: HTN  Clinical Impression  Pt admitted with above diagnosis. Pt with dense hemiplegia L side and L neglect. Pt with noted clonus LUE, did not see in LLE today. 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 Able to scoot to R into chair with max A +2 for safety. Pt did practice standing but L knee buckling and unable to tolerate wt on that side to step. Pt lives with wife and she can work from home. Is very motivated with good rehab potential and would benefit from CIR level therapies.  Pt currently with functional limitations due to the deficits listed below (see PT Problem List). Pt will benefit from skilled PT to increase their independence and safety with mobility to allow discharge to the venue listed below.          Recommendations for follow up therapy are one component of a multi-disciplinary discharge planning process, led by the attending physician.  Recommendations may be updated based on patient status, additional functional criteria and insurance authorization.  Follow Up Recommendations Acute inpatient rehab (3hours/day)    Assistance Recommended at Discharge Frequent or constant Supervision/Assistance  Patient can return home with the following  Two people to help with walking and/or transfers;Assistance with cooking/housework;A lot of help with bathing/dressing/bathroom;Assist for transportation;Help with stairs or ramp for entrance;Assistance with feeding    Equipment Recommendations Wheelchair (measurements PT);Other (comment) (other tbd)  Recommendations for Other Services  Rehab consult    Functional Status Assessment Patient has had a  recent decline in their functional status and demonstrates the ability to make significant improvements in function in a reasonable and predictable amount of time.     Precautions / Restrictions Precautions Precautions: Fall Restrictions Weight Bearing Restrictions: No      Mobility  Bed Mobility Overal bed mobility: Needs Assistance Bed Mobility: Supine to Sit     Supine to sit: Mod assist, +2 for physical assistance     General bed mobility comments: pt cued to roll L, needed mutliple cues to turn head to L and find rail. Able to grasp rail with R hand. Cued to hook LLE with RLE, achieved this with min A. Mod A for LE's off bed and elevation of trunk into sitting. L lean unless pt held footboard with R hand    Transfers Overall transfer level: Needs assistance Equipment used: None Transfers: Sit to/from Stand, Bed to chair/wheelchair/BSC Sit to Stand: Mod assist, +2 physical assistance          Lateral/Scoot Transfers: Max assist, +2 safety/equipment General transfer comment: L knee buckling in standing. Pt very stong on R and able to "muscle up" using that side. Mod A for support. Pt scooted into drop arm recliner to right with max A from front and +2 for safety from behind. Did better once he was able to grasp arm rest of recliner with R hand    Ambulation/Gait               General Gait Details: unable  Stairs            Wheelchair Mobility    Modified Rankin (Stroke Patients Only) Modified Rankin (Stroke Patients Only) Pre-Morbid Rankin  Score: No symptoms Modified Rankin: Severe disability     Balance Overall balance assessment: Needs assistance Sitting-balance support: Single extremity supported, Feet supported Sitting balance-Leahy Scale: Poor Sitting balance - Comments: loses balance to L Postural control: Left lateral lean Standing balance support: Bilateral upper extremity supported, During functional activity Standing balance-Leahy  Scale: Zero Standing balance comment: mod A +2 to maintain standing                             Pertinent Vitals/Pain Pain Assessment Pain Assessment: No/denies pain    Home Living Family/patient expects to be discharged to:: Private residence Living Arrangements: Spouse/significant other Available Help at Discharge: Available 24 hours/day Type of Home: House Home Access: Stairs to enter Entrance Stairs-Rails: Psychiatric nurse of Steps: 5 Alternate Level Stairs-Number of Steps: 15 Home Layout: Two level;Bed/bath upstairs;1/2 bath on main level Home Equipment: None Additional Comments: Owns cleaning business    Prior Function Prior Level of Function : Independent/Modified Independent                     Hand Dominance   Dominant Hand: Right    Extremity/Trunk Assessment   Upper Extremity Assessment Upper Extremity Assessment: Defer to OT evaluation    Lower Extremity Assessment Lower Extremity Assessment: LLE deficits/detail LLE Deficits / Details: no active motion noted LLE, pt able to detect deep pressure, not light touch LLE Sensation: decreased light touch;decreased proprioception LLE Coordination: decreased fine motor;decreased gross motor    Cervical / Trunk Assessment Cervical / Trunk Assessment: Normal  Communication   Communication: No difficulties  Cognition Arousal/Alertness: Awake/alert Behavior During Therapy: WFL for tasks assessed/performed Overall Cognitive Status: Impaired/Different from baseline                                 General Comments: L inattention        General Comments General comments (skin integrity, edema, etc.): BP 145/74, SPO2 95% on RA, HR 95 bpm after session. Pt fatigued after session. Falling asleep in recliner. Family present and educated on neglect and how to assist him    Exercises     Assessment/Plan    PT Assessment Patient needs continued PT services  PT  Problem List Decreased strength;Decreased activity tolerance;Decreased range of motion;Decreased balance;Decreased mobility;Decreased coordination;Decreased cognition;Decreased knowledge of use of DME;Decreased safety awareness;Decreased knowledge of precautions;Impaired tone;Impaired sensation       PT Treatment Interventions DME instruction;Gait training;Functional mobility training;Therapeutic activities;Therapeutic exercise;Balance training;Neuromuscular re-education;Cognitive remediation;Patient/family education;Wheelchair mobility training    PT Goals (Current goals can be found in the Care Plan section)  Acute Rehab PT Goals Patient Stated Goal: return to independence PT Goal Formulation: With patient/family Time For Goal Achievement: 04/29/21 Potential to Achieve Goals: Good    Frequency Min 4X/week     Co-evaluation               AM-PAC PT "6 Clicks" Mobility  Outcome Measure Help needed turning from your back to your side while in a flat bed without using bedrails?: A Little Help needed moving from lying on your back to sitting on the side of a flat bed without using bedrails?: A Lot Help needed moving to and from a bed to a chair (including a wheelchair)?: Total Help needed standing up from a chair using your arms (e.g., wheelchair or bedside chair)?: Total Help needed to walk in  hospital room?: Total Help needed climbing 3-5 steps with a railing? : Total 6 Click Score: 9    End of Session Equipment Utilized During Treatment: Gait belt Activity Tolerance: Patient tolerated treatment well Patient left: in chair;with call bell/phone within reach;with family/visitor present Nurse Communication: Mobility status PT Visit Diagnosis: Hemiplegia and hemiparesis;Difficulty in walking, not elsewhere classified (R26.2) Hemiplegia - Right/Left: Left Hemiplegia - dominant/non-dominant: Non-dominant Hemiplegia - caused by: Nontraumatic intracerebral hemorrhage    Time:  2179-8102 PT Time Calculation (min) (ACUTE ONLY): 37 min   Charges:   PT Evaluation $PT Eval Moderate Complexity: [place]  Pager [phone] Office [place] 04/15/2021, 12:19 PM

## 2021-04-15 NOTE — Progress Notes (Addendum)
STROKE TEAM PROGRESS NOTE   SUBJECTIVE (INTERVAL HISTORY) Patient is seen with wife, Miguel Mccullough, at the bedside.  Overall his condition is gradually improving. Reports improvements in dysarthria today, but weakness unchanged.  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 Follow-up CTH unchanged. MRI redemonstrates stable hematoma.    Home Meds: Patient lost insurance and ran out of medications, unsure of the time frame.   Metoprolol 75 mg BID Amlodipine 10 mg daily Losartan (uncertain dosage) Atorvastatin 20 mg  OBJECTIVE CBC:  Recent Labs  Lab 04/13/21 2123 04/13/21 2128  WBC 7.9  --   NEUTROABS 5.1  --   HGB 14.4 14.6  HCT 41.9 43.0  MCV 85.7  --   PLT 191  --     Basic Metabolic Panel:  Recent Labs  Lab 04/13/21 2123 04/13/21 2128 04/15/21 0244  NA 138 141 136  K 3.2* 3.2* 3.1*  CL 99 100 99  CO2 27  --  23  GLUCOSE 105* 104* 122*  BUN 18 24* 24*  CREATININE 2.05* 2.10* 2.73*  CALCIUM 8.9  --  8.6*  MG  --   --  2.3    Lipid Panel:  Recent Labs  Lab 04/13/21 2247 04/15/21 0244  CHOL 203*  --   TRIG 248* 278*  HDL 59  --   CHOLHDL 3.4  --   VLDL 50*  --   LDLCALC 94  --     HgbA1c:  Recent Labs  Lab 04/13/21 2123  HGBA1C 4.7*    Urine Drug Screen:  Recent Labs  Lab 04/14/21 0645  LABOPIA NONE DETECTED  COCAINSCRNUR NONE DETECTED  LABBENZ NONE DETECTED  AMPHETMU NONE DETECTED  THCU NONE DETECTED  LABBARB NONE DETECTED     Alcohol Level  Recent Labs  Lab 04/13/21 2123  ETH <10     IMAGING past 24 hours MR BRAIN WO CONTRAST  Result Date: 04/14/2021 CLINICAL DATA:  Stroke follow-up EXAM: MRI HEAD WITHOUT CONTRAST TECHNIQUE: Multiplanar, multiecho pulse sequences of the brain and surrounding structures were obtained without intravenous contrast. COMPARISON:  Head CT from earlier today FINDINGS: Brain: Known acute hemorrhage in the right basal ganglia with intraventricular extension. No detected  progression given cross modality differences. No hydrocephalus. There have been other prior microhemorrhages in the white matter, cerebellum, and brainstem, likely hypertensive. Gliosis in the pons and cerebral white matter which is extensive, with lacune at the left centrum semiovale and remote perforator infarct at the right basal ganglia. Constellation of findings correlate with chart history of hypertension. Brain volume is normal. Accounting for susceptibility artifact no acute infarct is seen adjacent to the hematoma. No gross mass lesion either. Vascular: Preceding CTA. Skull and upper cervical spine: Negative Sinuses/Orbits: Negative IMPRESSION: 1. Right basal ganglia hematoma with intraventricular extension that is stable from prior head CT. No hydrocephalus. 2. Hemorrhage location and background small vessel ischemic gliosis with chronic microhemorrhages suggests microvascular cause. Electronically Signed   By: Jorje Guild M.D.   On: 04/14/2021 10:43   DG Swallowing Func-Speech Pathology  Result Date: 04/14/2021 Table formatting from the original result was not included. Objective Swallowing Evaluation: Type of Study: MBS-Modified Barium Swallow Study  Patient Details Name: Miguel Mccullough MRN: [id number] Date of Birth: Sep 21, 1965 Today's Date: 04/14/2021 Time: SLP Start Time (ACUTE ONLY): 1415 -SLP Stop Time (ACUTE ONLY): 1430 SLP Time Calculation (min) (ACUTE ONLY): 15 min Past Medical History: Past Medical History: Diagnosis Date  Hypertension  Past Surgical History: No past surgical  history on file. HPI: Pt is a 56 y.o. male who presented to the ED for evaluation of sudden onset of left-sided weakness, left facial droop and slurred speech along with left-sided numbness. 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 No hydrocephalus. Small focus of hyperdensity at the anterior right temporal lobe, possibly a small amount of subarachnoid  hemorrhage or a cavernous  malformation. PMH: hypertension.  No data recorded  Recommendations for follow up therapy are one component of a multi-disciplinary discharge planning process, led by the attending physician.  Recommendations may be updated based on patient status, additional functional criteria and insurance authorization. Assessment / Plan / Recommendation Clinical Impressions 04/14/2021 Clinical Impression Pt presents with mild oropharyngeal dysphagia characterized by reduced bolus cohesion, and a pharyngeal delay. He exhibited premature spillage to the valleculae and pyriform sinuses. With liquids, the swallow was triggered with ~50% of the bolus at the level of the pyriform sinuses. Penetration (PAS 3) was noted with the barium tablet which was swallowed with thin liquids via straw. Coughing was noted thereafter; aspiration of the penetrate is suspected, but cannot be conclusively determined since pt moved out of the visual field and no aspirate was noted after coughing. Throat clearing was demonstrated throughout the study, but this did not align with any instances of laryngeal invasion. A regular texture diet with thin liquids is recommended at this time. SLP will continue to follow pt. SLP Visit Diagnosis Dysphagia, pharyngeal phase (R13.13) Attention and concentration deficit following -- Frontal lobe and executive function deficit following -- Impact on safety and function Mild aspiration risk   Treatment Recommendations 04/14/2021 Treatment Recommendations Therapy as outlined in treatment plan below   Prognosis 04/14/2021 Prognosis for Safe Diet Advancement Good Barriers to Reach Goals -- Barriers/Prognosis Comment -- Diet Recommendations 04/14/2021 SLP Diet Recommendations Regular solids;Thin liquid Liquid Administration via Cup;Straw Medication Administration Whole meds with puree Compensations Slow rate;Small sips/bites Postural Changes Seated upright at 90 degrees   Other Recommendations  04/14/2021 Recommended Consults -- Oral Care Recommendations Oral care BID Other Recommendations -- Follow Up Recommendations (No Data) Assistance recommended at discharge Intermittent Supervision/Assistance Functional Status Assessment Patient has had a recent decline in their functional status and demonstrates the ability to make significant improvements in function in a reasonable and predictable amount of time. Frequency and Duration  04/14/2021 Speech Therapy Frequency (ACUTE ONLY) min 2x/week Treatment Duration 2 weeks   Oral Phase 04/14/2021 Oral Phase Impaired Oral - Pudding Teaspoon -- Oral - Pudding Cup -- Oral - Honey Teaspoon -- Oral - Honey Cup -- Oral - Nectar Teaspoon -- Oral - Nectar Cup Decreased bolus cohesion;Premature spillage Oral - Nectar Straw Decreased bolus cohesion;Premature spillage Oral - Thin Teaspoon -- Oral - Thin Cup Decreased bolus cohesion;Premature spillage Oral - Thin Straw Decreased bolus cohesion;Premature spillage Oral - Puree WFL Oral - Mech Soft -- Oral - Regular WFL Oral - Multi-Consistency -- Oral - Pill Decreased bolus cohesion;Premature spillage Oral Phase - Comment --  Pharyngeal Phase 04/14/2021 Pharyngeal Phase Impaired Pharyngeal- Pudding Teaspoon -- Pharyngeal -- Pharyngeal- Pudding Cup -- Pharyngeal -- Pharyngeal- Honey Teaspoon -- Pharyngeal -- Pharyngeal- Honey Cup -- Pharyngeal -- Pharyngeal- Nectar Teaspoon -- Pharyngeal -- Pharyngeal- Nectar Cup -- Pharyngeal -- Pharyngeal- Nectar Straw Delayed swallow initiation-vallecula;Delayed swallow initiation-pyriform sinuses Pharyngeal -- Pharyngeal- Thin Teaspoon -- Pharyngeal -- Pharyngeal- Thin Cup Delayed swallow initiation-vallecula;Delayed swallow initiation-pyriform sinuses Pharyngeal -- Pharyngeal- Thin Straw Delayed swallow initiation-vallecula;Delayed swallow initiation-pyriform sinuses Pharyngeal -- Pharyngeal- Puree Delayed swallow  initiation-vallecula Pharyngeal -- Pharyngeal- Mechanical Soft -- Pharyngeal --  Pharyngeal- Regular WFL Pharyngeal -- Pharyngeal- Multi-consistency -- Pharyngeal -- Pharyngeal- Pill Delayed swallow initiation-pyriform sinuses Pharyngeal -- Pharyngeal Comment --  Cervical Esophageal Phase  04/14/2021 Cervical Esophageal Phase WFL Pudding Teaspoon -- Pudding Cup -- Honey Teaspoon -- Honey Cup -- Nectar Teaspoon -- Nectar Cup -- Nectar Straw -- Thin Teaspoon -- Thin Cup -- Thin Straw -- Puree -- Mechanical Soft -- Regular -- Multi-consistency -- Pill -- Cervical Esophageal Comment -- Shanika I. Hardin Negus, [place], [place] Office number [phone] Pager [phone] Horton Marshall 04/14/2021, 4:05 PM                     ECHOCARDIOGRAM COMPLETE  Result Date: 04/14/2021    ECHOCARDIOGRAM REPORT   Patient Name:   Miguel Mccullough Date of Exam: 04/14/2021 Medical Rec #:  [id number]     Height: Accession #:    [id number]    Weight:       177.2 lb Date of Birth:  [birth year]      BSA:          1.863 m Patient Age:    33 years      BP:           151/88 mmHg Patient Gender: M             HR:           102 bpm. Exam Location:  Inpatient Procedure: 2D Echo, Cardiac Doppler and Color Doppler Indications:    I63.9 STROKE  History:        Patient has no prior history of Echocardiogram examinations.                 Risk Factors:Hypertension.  Sonographer:    Beryle Beams Referring Phys: [id number] ASHISH ARORA IMPRESSIONS  1. Left ventricular ejection fraction, by estimation, is 70 to 75%. The left ventricle has hyperdynamic function. The left ventricle has no regional wall motion abnormalities. There is severe concentric left ventricular hypertrophy. Left ventricular diastolic parameters are consistent with Grade II diastolic dysfunction (pseudonormalization).  2. Right ventricular systolic function is normal. The right ventricular size is normal.  3. Left atrial size was mild to moderately dilated.  4. The mitral valve is normal in structure. Trivial mitral valve regurgitation. No  evidence of mitral stenosis.  5. The aortic valve is tricuspid. There is mild thickening of the aortic valve. Aortic valve regurgitation is not visualized. Aortic valve sclerosis is present, with no evidence of aortic valve stenosis.  6. The inferior vena cava is normal in size with <50% respiratory variability, suggesting right atrial pressure of 8 mmHg.  7. Given severe LVH which may be related to hypertensive heart disease, would also consider CMR to assess for possible HCM. Comparison(s): No prior Echocardiogram. Conclusion(s)/Recommendation(s): No intracardiac source of embolism detected on this transthoracic study. Consider a transesophageal echocardiogram to exclude cardiac source of embolism if clinically indicated. FINDINGS  Left Ventricle: Left ventricular ejection fraction, by estimation, is 70 to 75%. The left ventricle has hyperdynamic function. The left ventricle has no regional wall motion abnormalities. The left ventricular internal cavity size was normal in size. There is severe concentric left ventricular hypertrophy. Left ventricular diastolic parameters are consistent with Grade II diastolic dysfunction (pseudonormalization). Right Ventricle: The right ventricular size is normal. Right vetricular wall thickness was not well visualized. Right ventricular systolic function is normal. Left Atrium: Left atrial size was mild to moderately  dilated. Right Atrium: Right atrial size was normal in size. Pericardium: There is no evidence of pericardial effusion. Mitral Valve: The mitral valve is normal in structure. There is mild thickening of the mitral valve leaflet(s). Trivial mitral valve regurgitation. No evidence of mitral valve stenosis. Tricuspid Valve: The tricuspid valve is normal in structure. Tricuspid valve regurgitation is trivial. Aortic Valve: The aortic valve is tricuspid. There is mild thickening of the aortic valve. Aortic valve regurgitation is not visualized. Aortic valve sclerosis is  present, with no evidence of aortic valve stenosis. Aortic valve mean gradient measures 12.0 mmHg. Aortic valve peak gradient measures 23.8 mmHg. Aortic valve area, by VTI measures 1.44 cm. Pulmonic Valve: The pulmonic valve was normal in structure. Pulmonic valve regurgitation is trivial. Aorta: The aortic root is normal in size and structure. Venous: The inferior vena cava is normal in size with less than 50% respiratory variability, suggesting right atrial pressure of 8 mmHg. IAS/Shunts: The atrial septum is grossly normal.  LEFT VENTRICLE PLAX 2D LVIDd:         4.10 cm      Diastology LVIDs:         2.20 cm      LV e' medial:    7.58 cm/s LV PW:         1.30 cm      LV E/e' medial:  15.2 LV IVS:        1.50 cm      LV e' lateral:   8.21 cm/s LVOT diam:     1.70 cm      LV E/e' lateral: 14.0 LV SV:         49 LV SV Index:   27 LVOT Area:     2.27 cm  LV Volumes (MOD) LV vol d, MOD A2C: 98.4 ml LV vol d, MOD A4C: 108.0 ml LV vol s, MOD A2C: 44.4 ml LV vol s, MOD A4C: 51.0 ml LV SV MOD A2C:     54.0 ml LV SV MOD A4C:     108.0 ml LV SV MOD BP:      62.6 ml RIGHT VENTRICLE             IVC RV S prime:     20.50 cm/s  IVC diam: 1.90 cm RVOT diam:      2.00 cm TAPSE (M-mode): 2.9 cm LEFT ATRIUM             Index        RIGHT ATRIUM           Index LA diam:        4.30 cm 2.31 cm/m   RA Area:     14.20 cm LA Vol (A2C):   73.3 ml 39.34 ml/m  RA Volume:   35.60 ml  19.11 ml/m LA Vol (A4C):   68.7 ml 36.87 ml/m LA Biplane Vol: 74.4 ml 39.93 ml/m  AORTIC VALVE                     PULMONIC VALVE AV Area (Vmax):    1.43 cm      PV Vmax:       0.69 m/s AV Area (Vmean):   1.42 cm      PV Vmean:      43.200 cm/s AV Area (VTI):     1.44 cm      PV VTI:        0.107 m AV Vmax:  244.00 cm/s   PV Peak grad:  1.9 mmHg AV Vmean:          160.000 cm/s  PV Mean grad:  1.0 mmHg AV VTI:            0.343 m AV Peak Grad:      23.8 mmHg AV Mean Grad:      12.0 mmHg LVOT Vmax:         154.00 cm/s LVOT Vmean:        100.000  cm/s LVOT VTI:          0.218 m LVOT/AV VTI ratio: 0.64  AORTA Ao Root diam: 2.50 cm Ao Asc diam:  2.80 cm MITRAL VALVE MV Area (PHT): 4.63 cm     SHUNTS MV Decel Time: 164 msec     Systemic VTI:  0.22 m MR Peak grad: 51.6 mmHg     Systemic Diam: 1.70 cm MR Mean grad: 33.0 mmHg     Pulmonic Diam: 2.00 cm MR Vmax:      359.00 cm/s MR Vmean:     265.0 cm/s MV E velocity: 115.00 cm/s MV A velocity: 78.30 cm/s MV E/A ratio:  1.47 Gwyndolyn Kaufman MD Electronically signed by Gwyndolyn Kaufman MD Signature Date/Time: 04/14/2021/11:27:08 AM    Final      PHYSICAL EXAM Temp:  [98.4 F (36.9 C)-98.6 F (37 C)] 98.4 F (36.9 C) (02/28 0800) Pulse Rate:  [71-111] 90 (02/28 0800) Resp:  [17-31] 30 (02/28 0800) BP: (101-182)/(57-98) 145/78 (02/28 0800) SpO2:  [85 %-100 %] 95 % (02/28 0800)  General - Well nourished, well developed, age congruent male in no apparent distress.  Cardiovascular - Regular rhythm and rate on telemetry.  Mental Status -  Level of arousal and orientation to time, place, and person were intact. Language including expression, naming, repetition, comprehension was assessed and found intact, only with mild dysarthria  Cranial Nerves II - XII - II - Visual field intact OU. III, IV, VI - Extraocular movements intact, but patient with R gaze preference V - Facial sensation intact bilaterally. VII - Facial movement with L facial droop VIII - Hearing & vestibular intact bilaterally. X - Palate elevates symmetrically. XII - Tongue protrusion with L deviation.  Motor Strength - The patients strength was normal in RU and RL extremities and pronator drift was absent. 1/5 LLE and 0/5 flaccid LUE. Bulk was normal and fasciculations were absent.   Motor Tone - Muscle tone was assessed and increased in LLE, but otherwise normal in other extremities.  Sensory - Light touch was assessed and asymmetrical in LE with some L hemineglect. When touching L leg alone, able to identify but when  touching both legs, only says R.  Coordination - The patient had normal movements in the R hand and RLE with no ataxia or dysmetria.  Tremor was absent.  Gait and Station - deferred.    ASSESSMENT/PLAN Miguel Mccullough is a 56 y.o. male with history of HTN presenting with L sided weakness, numbness, and facial droop and dysarthria. 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  CTA head & neck No vessel stenosis, occlusion, aneurysm, or dissection.  Follow-up CTH stable IPH. MRI  R basal ganglia hematoma with intraventricular extension; stable from follow-up CT. Background small vessel ischemic gliosis with chronic microhemorrhages. 2D Echo LVEF 70-75%, severe LVH, Grade II diastolic dysfunction, LA  mild to mod dilated, trivial MVR, Tricuspid aortic valve with mild thickening.  LDL 94 HgbA1c 4.7% VTE prophylaxis - SCDs    Diet   Diet regular Room service appropriate? Yes with Assist; Fluid consistency: Thin   No antithrombotic prior to admission, now on No antithrombotic. Will not initiate therapy until patient 5-7 days post-IPH, pending hematoma remains stable. Therapy recommendations:  AIR; needs continued SLP follow-up Disposition:  Pending  Hypertensive Emergency Hypertensive Encephalopathy Home meds:  See in HPI; Non-compliant Stable overnight with maximal Cleviprex and PRN Labetalol Initiated oral anti-HTNsive meds today: Amlodipine 10 mg, Metoprolol 50 mg BID, and Clonidine 0.1 mg BID.  Cleviprex remains in place for additional BP coverage. Wean as tolerated. Goal SBP 130-150 Long-term BP goal normotensive  Hyperlipidemia Home meds:  N/A LDL 94., goal < 70 Initiating Atorvastatin 40 mg High intensity statin indicated  Continue statin at discharge  Diabetes type II Assessment Home meds:  N/A HgbA1c 4.7, goal < 7.0 CBGs Recent Labs     04/13/21 2113  GLUCAP 104*     Other Stroke Risk Factors ETOH use, alcohol level <10, advised to drink no more than 1-2 drink(s) a day Substance abuse - UDS:  THC NONE DETECTED, Cocaine NONE DETECTED.  Obesity, There is no height or weight on file to calculate BMI., BMI >/= 30 associated with increased stroke risk, recommend weight loss, diet and exercise as appropriate   Other Active Problems Severe LVH: recommend to follow-up outpatient with cardiology to assess for HCM. Will submit referral.   Hospital day # 2    Rosezetta Schlatter, MD Stroke Neurology- Neuro Psych Resident 04/15/2021 8:53 AM  ATTENDING ATTESTATION:  56 year old past medical history of recurrent hypertension noncompliant medications presenting with left-sided numbness facial droop slurred speech.  CT shows right basal ganglia hemorrhage with intraventricular extension.  Exam consists of dysarthria and left hemiplegia. Oral BP meds added today. Discussed importance of BP meds with pt and wife, will need to shop around for best prices since they are self pay.  Social work consult placed.   Goal is to get pt off cleviprex ggt and out of ICU  Dr. Reeves Forth evaluated pt independently, reviewed imaging, chart, labs. Discussed and formulated plan with the APP. Please see APP note above for details.     This patient is critically ill due to [place], hypertension requiring ggt and at significant risk of neurological worsening, death form heart failure, respiratory failure, recurrent stroke, bleeding from [facility], seizure, sepsis. This patient's care requires constant monitoring of vital signs, hemodynamics, respiratory and cardiac monitoring, review of multiple databases, neurological assessment, discussion with family, other specialists and medical decision making of high complexity. I spent 35 minutes of neurocritical care time in the care of this patient.   Tarig Zimmers,MD   To contact Stroke Continuity provider, please refer to  [email or web address] After hours, contact General Neurology

## 2021-04-15 NOTE — Progress Notes (Signed)
Patient ID: Miguel Mccullough, male   DOB: [birth year], 56 y.o.   MRN: [id number]  PT's BP still elevated requiring cleviprex despite 3 agents added today. Will con't to monitor and add another agent tomorrow. Two family members siblings are in the room, I updated them on his dx and plan. Answered their questions. Est time spent 88mins.

## 2021-04-16 LAB — CBC
HCT: 35.2 % — ABNORMAL LOW (ref 39.0–52.0)
HCT: 38 % — ABNORMAL LOW (ref 39.0–52.0)
Hemoglobin: 12.2 g/dL — ABNORMAL LOW (ref 13.0–17.0)
Hemoglobin: 13 g/dL (ref 13.0–17.0)
MCH: 29.5 pg (ref 26.0–34.0)
MCH: 29.6 pg (ref 26.0–34.0)
MCHC: 34.7 g/dL (ref 30.0–36.0)
MCHC: 34.2 g/dL (ref 30.0–36.0)
MCV: 85.2 fL (ref 80.0–100.0)
MCV: 86.6 fL (ref 80.0–100.0)
Platelets: 186 10*3/uL (ref 150–400)
Platelets: 182 10*3/uL (ref 150–400)
RBC: 4.13 MIL/uL — ABNORMAL LOW (ref 4.22–5.81)
RBC: 4.39 MIL/uL (ref 4.22–5.81)
RDW: 13.4 % (ref 11.5–15.5)
RDW: 13.6 % (ref 11.5–15.5)
WBC: 12 10*3/uL — ABNORMAL HIGH (ref 4.0–10.5)
WBC: 11.3 10*3/uL — ABNORMAL HIGH (ref 4.0–10.5)
nRBC: 0 % (ref 0.0–0.2)
nRBC: 0 % (ref 0.0–0.2)

## 2021-04-16 LAB — BASIC METABOLIC PANEL
Anion gap: 11 (ref 5–15)
BUN: 28 mg/dL — ABNORMAL HIGH (ref 6–20)
CO2: 25 mmol/L (ref 22–32)
Calcium: 8.1 mg/dL — ABNORMAL LOW (ref 8.9–10.3)
Chloride: 100 mmol/L (ref 98–111)
Creatinine, Ser: 2.57 mg/dL — ABNORMAL HIGH (ref 0.61–1.24)
GFR, Estimated: 29 mL/min — ABNORMAL LOW (ref >60)
Glucose, Bld: 137 mg/dL — ABNORMAL HIGH (ref 70–99)
Potassium: 3.2 mmol/L — ABNORMAL LOW (ref 3.5–5.1)
Sodium: 136 mmol/L (ref 135–145)

## 2021-04-16 LAB — TRIGLYCERIDES: Triglycerides: 78 mg/dL (ref <150)

## 2021-04-16 MED ORDER — CLONIDINE HCL 0.1 MG PO TABS
0.2000 mg | ORAL_TABLET | Freq: Three times a day (TID) | ORAL | Status: DC
  Administered 2021-04-16 – 2021-04-17 (×5): 0.2 mg via ORAL
  Filled 2021-04-16 (×3): qty 1
  Filled 2021-04-16: qty 2
  Filled 2021-04-16: qty 1
  Filled 2021-04-16: qty 2

## 2021-04-16 MED ORDER — HYDRALAZINE HCL 25 MG PO TABS: 25.0000 mg | ORAL_TABLET | Freq: Three times a day (TID) | ORAL | Status: DC

## 2021-04-16 MED ORDER — LABETALOL HCL 5 MG/ML IV SOLN
10.0000 mg | INTRAVENOUS | Status: DC | PRN
  Administered 2021-04-16: 10 mg via INTRAVENOUS
  Filled 2021-04-16: qty 4

## 2021-04-16 MED ORDER — HYDRALAZINE HCL 20 MG/ML IJ SOLN: 10.0000 mg | Freq: Four times a day (QID) | INTRAMUSCULAR | Status: DC | PRN

## 2021-04-16 NOTE — Progress Notes (Signed)
Inpatient Rehabilitation Admissions Coordinator  ? ?I met with patient , wife and mother in law at bedside. We discussed goals and expectations of a possible Cir admit. We also reviewed cost of care as he is uninsured. He is a great candidate for CIR. I await medical clearance to discharge to Cir. Hopeful in the next few days and CIR beds are available later this week. ? ?Danne Baxter, RN, MSN ?Rehab Admissions Coordinator ?[phone] ?04/16/2021 1:01 PM ? ?

## 2021-04-16 NOTE — Progress Notes (Signed)
Physical Therapy Treatment ?Patient Details ?Name: Miguel Mccullough ?MRN: [id number] ?DOB: 01-14-1966 ?Today's Date: 04/16/2021 ? ? ?History of Present Illness Pt is 56 yo male who presents with L side weakness, L facial droop, and slurred speech. 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 PMH: HTN and covid ? ?  ?PT Comments  ? ? Pt tolerates treatment well despite continued L hemiplegia. Pt participates in transfer training with emphasis on glute activation and improved posture. Pt with more success when standing with UE support of bed rail at this time. Pt continues to demonstrate L inattention, PT providing education on importance of continued exercise of L side with visual attention to that side. PT will continue to follow to aide in reducing falls risk.   ?Recommendations for follow up therapy are one component of a multi-disciplinary discharge planning process, led by the attending physician.  Recommendations may be updated based on patient status, additional functional criteria and insurance authorization. ? ?Follow Up Recommendations ? Acute inpatient rehab (3hours/day) ?  ?  ?Assistance Recommended at Discharge Frequent or constant Supervision/Assistance  ?Patient can return home with the following Two people to help with walking and/or transfers;Assistance with cooking/housework;A lot of help with bathing/dressing/bathroom;Assist for transportation;Help with stairs or ramp for entrance;Assistance with feeding ?  ?Equipment Recommendations ? Wheelchair (measurements PT);Wheelchair cushion (measurements PT)  ?  ?Recommendations for Other Services   ? ? ?  ?Precautions / Restrictions Precautions ?Precautions: Fall ?Precaution Comments: L hemiplegia ?Restrictions ?Weight Bearing Restrictions: No  ?  ? ?Mobility ? Bed Mobility ?  ?  ?  ?  ?  ?  ?  ?  ?  ? ?Transfers ?Overall transfer level: Needs assistance ?Equipment used: 1 person hand held assist (bed railing) ?Transfers: Sit to/from  Stand ?Sit to Stand: Mod assist, Min assist (minA at bedrail) ?  ?  ?  ?  ?  ?General transfer comment: pt performs 10 sit to stands with PT assist, initially with face to face assist and L knee block, final 6 reps performed at bedrail with UE support of railing. PT blocking L foot to prevent sliding ?  ? ?Ambulation/Gait ?  ?  ?  ?  ?  ?  ?Pre-gait activities: pt participates in weight shifting in standing, maxA with L knee buckle with shift to left ?  ? ? ?Stairs ?  ?  ?  ?  ?  ? ? ?Wheelchair Mobility ?  ? ?Modified Rankin (Stroke Patients Only) ?Modified Rankin (Stroke Patients Only) ?Pre-Morbid Rankin Score: No symptoms ?Modified Rankin: Severe disability ? ? ?  ?Balance Overall balance assessment: Needs assistance ?Sitting-balance support: Single extremity supported, Feet supported ?Sitting balance-Leahy Scale: Poor ?Sitting balance - Comments: requires RUE support to maintain sitting balance ?Postural control: Posterior lean ?Standing balance support: Bilateral upper extremity supported ?Standing balance-Leahy Scale: Poor ?Standing balance comment: maxA with L knee block or minA UE support of railing and L foot blocked ?  ?  ?  ?  ?  ?  ?  ?  ?  ?  ?  ?  ? ?  ?Cognition Arousal/Alertness: Awake/alert ?Behavior During Therapy: [facility] for tasks assessed/performed ?Overall Cognitive Status: Impaired/Different from baseline ?Area of Impairment: Awareness ?  ?  ?  ?  ?  ?  ?  ?  ?  ?  ?  ?  ?  ?Awareness: Emergent ?  ?General Comments: L inattention ?  ?  ? ?  ?Exercises   ? ?  ?  General Comments General comments (skin integrity, edema, etc.): mild hypertension up to 162/100, RN aware ?  ?  ? ?Pertinent Vitals/Pain Pain Assessment ?Pain Assessment: No/denies pain  ? ? ?Home Living   ?  ?  ?  ?  ?  ?  ?  ?  ?  ?   ?  ?Prior Function    ?  ?  ?   ? ?PT Goals (current goals can now be found in the care plan section) Acute Rehab PT Goals ?Patient Stated Goal: return to independence ?Progress towards PT goals: Progressing  toward goals ? ?  ?Frequency ? ? ? Min 4X/week ? ? ? ?  ?PT Plan Current plan remains appropriate  ? ? ?Co-evaluation   ?  ?  ?  ?  ? ?  ?AM-PAC PT "6 Clicks" Mobility   ?Outcome Measure ? Help needed turning from your back to your side while in a flat bed without using bedrails?: A Little ?Help needed moving from lying on your back to sitting on the side of a flat bed without using bedrails?: A Lot ?Help needed moving to and from a bed to a chair (including a wheelchair)?: A Lot ?Help needed standing up from a chair using your arms (e.g., wheelchair or bedside chair)?: A Lot ?Help needed to walk in hospital room?: Total ?Help needed climbing 3-5 steps with a railing? : Total ?6 Click Score: 11 ? ?  ?End of Session   ?Activity Tolerance: Patient tolerated treatment well ?Patient left: in chair;with call bell/phone within reach ?Nurse Communication: Mobility status ?PT Visit Diagnosis: Hemiplegia and hemiparesis;Difficulty in walking, not elsewhere classified (R26.2) ?Hemiplegia - Right/Left: Left ?Hemiplegia - dominant/non-dominant: Non-dominant ?Hemiplegia - caused by: Nontraumatic intracerebral hemorrhage ?  ? ? ?Time: 3710-6269 ?PT Time Calculation (min) (ACUTE ONLY): 27 min ? ?Charges:  $Therapeutic Activity: 23-37 mins          ?          ? ?Zenaida Niece, PT, DPT ?Acute Rehabilitation ?Pager: [phone] ?Office [phone] ? ? ? ?Zenaida Niece ?04/16/2021, 4:00 PM ? ?

## 2021-04-16 NOTE — PMR Pre-admission (Signed)
PMR Admission Coordinator Pre-Admission Assessment  Patient: Miguel Mccullough is an 56 y.o., male MRN: [id number] DOB: [birth year] Height: 5' 4"  (162.6 cm) Weight: 77.6 kg  Insurance Information HMO:     PPO:      PCP:      IPA:      80/20:      OTHER:  PRIMARY: uninsured       Estimate of cost of care provided and encouraged to apply for disability  Financial Counselor:       Phone#:   [facility] for patients in Inpatient Rehabilitation Facilities with attached Privacy Act [place] Records was provided and verbally reviewed with: N/A  Emergency Contact Information Contact Information     Name Relation Home Work Mobile   Uresti,Stephanie Spouse   [phone]      Current Medical History  Patient Admitting Diagnosis: CVA  History of Present Illness: HPI: Miguel Mccullough is a 56 year old male with history of HTN but no medications (had stopped meds and not followed up with PCP) who was admitted on 04/13/21 with sudden onset of slurred speech with left-sided numbness followed by weakness and BP noted to be elevated in 270s in route to the hospital.  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  CTA head/neck was negative for aneurysm, dissection, occlusion or significant stenosis.  He was started on Cleviprex for BP control with follow-up CT head showing no change in IPH.  2D echo done showing EF 70 to 75% with severe LVH and grade 2 diastolic dysfunction as well as mild to moderate LA dilatation.   He with question of CKD and had worsening of renal status with follow-up renal ultrasound done 03/02 showing minimal increase in echogenicity.  He also developed significant drop in potassium requiring multiple runs of K as well as oral supplementation.  He was weaned off Cleviprex yesterday and BP medications being titrated for SBP goal < 160.  He was noted to have cognitive deficits with poor  safety awareness as well as mild oropharyngeal dysphagia with premature spillage and currently tolerating regular diet.  Patient with resultant left-sided weakness with numbness, left facial droop with slurred speech as well as mild dysphagia affecting overall functional status.     Complete NIHSS TOTAL: 11  Patient's medical record [facility] has been reviewed by the rehabilitation admission coordinator and physician.  Past Medical History  Past Medical History:  Diagnosis Date   Hypertension    Has the patient had major surgery during 100 days prior to admission? No  Family History   family history is not on file.  Current Medications  Current Facility-Administered Medications:    acetaminophen (TYLENOL) tablet 650 mg, 650 mg, Oral, Q4H PRN, 650 mg at 04/17/21 0456 **OR** acetaminophen (TYLENOL) 160 MG/5ML solution 650 mg, 650 mg, Per Tube, Q4H PRN **OR** acetaminophen (TYLENOL) suppository 650 mg, 650 mg, Rectal, Q4H PRN, Amie Portland, MD   amLODipine (NORVASC) tablet 10 mg, 10 mg, Oral, Daily, Cosby, Courtney, MD, 10 mg at 04/17/21 1027   atorvastatin (LIPITOR) tablet 40 mg, 40 mg, Oral, QHS, Cosby, Courtney, MD, 40 mg at 04/16/21 2135   Chlorhexidine Gluconate Cloth 2 % PADS 6 each, 6 each, Topical, Daily, Amie Portland, MD, 6 each at 04/16/21 8099   cloNIDine (CATAPRES) tablet 0.2 mg, 0.2 mg, Oral, TID, Charlean Merl, Devon, NP, 0.2 mg at 04/17/21 1027   hydrALAZINE (APRESOLINE) injection 10 mg, 10 mg, Intravenous,  Q6H PRN, Janine Ores, NP   labetalol (NORMODYNE) injection 10 mg, 10 mg, Intravenous, Q2H PRN, Charlean Merl, Devon, NP, 10 mg at 04/16/21 2140   metoprolol tartrate (LOPRESSOR) tablet 50 mg, 50 mg, Oral, BID, Cosby, Courtney, MD, 50 mg at 04/17/21 1027   ondansetron (ZOFRAN) injection 4 mg, 4 mg, Intravenous, Q6H PRN, Amie Portland, MD, 4 mg at 04/15/21 0108   pantoprazole (PROTONIX) EC tablet 40 mg, 40 mg, Oral, QHS, Palikh, Gaurang M, MD, 40 mg at 04/16/21 2137    senna-docusate (Senokot-S) tablet 1 tablet, 1 tablet, Oral, BID, Amie Portland, MD, 1 tablet at 04/17/21 1027  Patients Current Diet:  Diet Order             Diet renal with fluid restriction Fluid restriction: 1200 mL Fluid; Room service appropriate? Yes; Fluid consistency: Thin  Diet effective now                  Precautions / Restrictions Precautions Precautions: Fall Precaution Comments: L hemiplegia Restrictions Weight Bearing Restrictions: No   Has the patient had 2 or more falls or a fall with injury in the past year? No  Prior Activity Level Community (5-7x/wk): Independent; self employed, driving; cleans restaurants  Prior Functional Level Self Care: Did the patient need help bathing, dressing, using the toilet or eating? Independent  Indoor Mobility: Did the patient need assistance with walking from room to room (with or without device)? Independent  Stairs: Did the patient need assistance with internal or external stairs (with or without device)? Independent  Functional Cognition: Did the patient need help planning regular tasks such as shopping or remembering to take medications? Independent  Patient Information Are you of Hispanic, Latino/a,or Spanish origin?: A. No, not of Hispanic, Latino/a, or Spanish origin What is your race?: B. Black or African American Do you need or want an interpreter to communicate with a doctor or health care staff?: 0. No  Patient's Response To:  Health Literacy and Transportation Is the patient able to respond to health literacy and transportation needs?: Yes Health Literacy - How often do you need to have someone help you when you read instructions, pamphlets, or other written material from your doctor or pharmacy?: Never In the past 12 months, has lack of transportation kept you from medical appointments or from getting medications?: No In the past 12 months, has lack of transportation kept you from meetings, work, or from  getting things needed for daily living?: No  Development worker, international aid / [place] Devices/Equipment: Eyeglasses Home Equipment: None  Prior Device Use: Indicate devices/aids used by the patient prior to current illness, exacerbation or injury? None of the above  Current Functional Level Cognition  Arousal/Alertness: Awake/alert Overall Cognitive Status: Impaired/Different from baseline Current Attention Level: Focused Orientation Level: Oriented X4 Following Commands: Follows one step commands consistently, Follows multi-step commands inconsistently Safety/Judgement: Decreased awareness of safety, Decreased awareness of deficits General Comments: L inattention, required cues to position LUE Attention: Sustained, Focused Focused Attention: Impaired Focused Attention Impairment: Verbal complex Sustained Attention: Impaired Sustained Attention Impairment: Verbal complex Memory: Impaired Memory Impairment: Retrieval deficit, Decreased recall of new information (Immediate: 5/5 with repetition x3; delayed: 3/5; with cues: 2/2) Awareness: Impaired Awareness Impairment: Emergent impairment Problem Solving: Impaired Problem Solving Impairment: Verbal complex (Money: 0/3; Time: 1/1 with cues) Executive Function: Sequencing, Technical brewer: Impaired Sequencing Impairment: Verbal complex (Clock: 2/2) Organizing: Appears intact (backward digit span: 3/3 with additional processing time.)    Extremity Assessment (includes Sensation/Coordination)  Upper Extremity Assessment: LUE deficits/detail LUE Sensation: decreased light touch LUE Coordination: decreased fine motor, decreased gross motor  Lower Extremity Assessment: LLE deficits/detail LLE Deficits / Details: no active motion noted LLE, pt able to detect deep pressure, not light touch LLE Sensation: decreased light touch, decreased proprioception LLE Coordination: decreased fine motor, decreased gross motor     ADLs  Overall ADL's : Needs assistance/impaired Eating/Feeding: Minimal assistance, Sitting Grooming: Wash/dry hands, Wash/dry face, Minimal assistance, Sitting Grooming Details (indicate cue type and reason): perfromed seated in recliner with cues to attend left side Upper Body Bathing: Moderate assistance, Sitting Upper Body Bathing Details (indicate cue type and reason): supported sitting.  No functional use of LLE. Lower Body Bathing: Maximal assistance, Sit to/from stand, Cueing for compensatory techniques, Cueing for safety Lower Body Bathing Details (indicate cue type and reason): Pt with heavy L lean and has difficulty finding midline Upper Body Dressing : Maximal assistance, Sitting Lower Body Dressing: Maximal assistance, Sit to/from stand, Cueing for safety, Cueing for compensatory techniques Lower Body Dressing Details (indicate cue type and reason): assist sit to stand and assist to tend to L side of body when dressing Toilet Transfer: Maximal assistance, BSC/3in1, Squat-pivot Toilet Transfer Details (indicate cue type and reason): transfer to strong side. Toileting- Clothing Manipulation and Hygiene: Total assistance, Sit to/from stand, Cueing for compensatory techniques, Cueing for safety Functional mobility during ADLs: Maximal assistance, +2 for physical assistance General ADL Comments: addressed locating ADL items on left with 3/3    Mobility  Overal bed mobility: Needs Assistance Bed Mobility: Rolling, Supine to Sit, Sit to Supine Rolling: Min assist Supine to sit: Max assist Sit to supine: Max assist General bed mobility comments: verbal cues for rail use and assisting LLE off bed    Transfers  Overall transfer level: Needs assistance Equipment used: 1 person hand held assist Transfers: Sit to/from Stand, Bed to chair/wheelchair/BSC Sit to Stand: Mod assist Bed to/from chair/wheelchair/BSC transfer type:: Stand pivot Stand pivot transfers: Max assist   Lateral/Scoot Transfers: Max assist, +2 safety/equipment General transfer comment: Stand pivot transfer with LLE knee blocked    Ambulation / Gait / Stairs / Wheelchair Mobility  Ambulation/Gait General Gait Details: unable Pre-gait activities: pt participates in weight shifting in standing, maxA with L knee buckle with shift to left    Posture / Balance Dynamic Sitting Balance Sitting balance - Comments: requires RUE support to maintain sitting balance Balance Overall balance assessment: Needs assistance Sitting-balance support: Single extremity supported, Feet supported Sitting balance-Leahy Scale: Poor Sitting balance - Comments: requires RUE support to maintain sitting balance Postural control: Posterior lean, Left lateral lean Standing balance support: Bilateral upper extremity supported Standing balance-Leahy Scale: Poor Standing balance comment: max assist to maintain balance during transfer    Special needs/care consideration Needs PCP arranged before discharge   Previous Home Environment  Living Arrangements: Spouse/significant other  Lives With:  (spouse and daughter is 76 years old) Available Help at Discharge: Available 24 hours/day Type of Home: House Home Layout: Two level, Bed/bath upstairs, 1/2 bath on main level Alternate Level Stairs-Rails: Right Alternate Level Stairs-Number of Steps: 15 Home Access: Stairs to enter Entrance Stairs-Rails: Right, Left Entrance Stairs-Number of Steps: 5 Bathroom Shower/Tub: Multimedia programmer: Standard Bathroom Accessibility: Yes How Accessible: Accessible via walker Home Care Services: No Additional Comments: Owns cleaning business  Discharge Living Setting Plans for Discharge Living Setting: Patient's home, Lives with (comment) (wife and 69 year old daughter) Type of Home at Discharge: House  Discharge Home Layout: Two level, Bed/bath upstairs, 1/2 bath on main level Alternate Level Stairs-Rails:  Right Alternate Level Stairs-Number of Steps: 15 Discharge Home Access: Stairs to enter Entrance Stairs-Rails: Right, Left Entrance Stairs-Number of Steps: 5 Discharge Bathroom Shower/Tub: Walk-in shower Discharge Bathroom Toilet: Standard Discharge Bathroom Accessibility: Yes How Accessible: Accessible via walker Does the patient have any problems obtaining your medications?: No  Social/Family/Support Systems Patient Roles: Spouse, 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 Information: wife, stephanie Anticipated Caregiver: wife and daughter Anticipated Ambulance person Information: see contacts Ability/Limitations of Caregiver: wife can work from home Caregiver Availability: 24/7 Discharge Plan Discussed with Primary Caregiver: Yes Is Caregiver In Agreement with Plan?: Yes Does Caregiver/Family have Issues with Lodging/Transportation while Pt is in Rehab?: No  Goals Patient/Family Goal for Rehab: min assist with PT and OT, supervision with SLP Expected length of stay: ELOS 14 to 20 days Pt/Family Agrees to Admission and willing to participate: Yes Program Orientation Provided & Reviewed with Pt/Caregiver Including Roles  & Responsibilities: Yes  Decrease burden of Care through IP rehab admission: n/a  Possible need for SNF placement upon discharge: not anticipated  Patient Condition: I have reviewed medical records from [facility], spoken with CM, and patient, spouse, and family member. I met with patient at the bedside for inpatient rehabilitation assessment.  Patient will benefit from ongoing PT, OT, and SLP, can actively participate in 3 hours of therapy a day 5 days of the week, and can make measurable gains during the admission.  Patient will also benefit from the coordinated team approach during an Inpatient Acute Rehabilitation admission.  The patient will receive intensive therapy as well as Rehabilitation physician, nursing, social worker, and care  management interventions.  Due to bladder management, bowel management, safety, skin/wound care, disease management, medication administration, pain management, and patient education the patient requires 24 hour a day rehabilitation nursing.  The patient is currently mod assist overall with mobility and basic ADLs.  Discharge setting and therapy post discharge at home with home health is anticipated.  Patient has agreed to participate in the Acute Inpatient Rehabilitation Program and will admit today.  Preadmission Screen Completed By:  Cleatrice Burke, 04/17/2021 11:48 AM ______________________________________________________________________   Discussed status with Dr. Dagoberto Ligas on 04/17/2021 at 63 and received approval for admission today.  Admission Coordinator:  Cleatrice Burke, RN, time  1150 Date 04/17/2021   Assessment/Plan: Diagnosis: Does the need for close, 24 hr/day Medical supervision in concert with the patient's rehab needs make it unreasonable for this patient to be served in a less intensive setting? Yes Co-Morbidities requiring supervision/potential complications: HTN unbcontrolled; R BG IPH; L hemiplegia; L inattention-  Due to bladder management, bowel management, safety, skin/wound care, disease management, medication administration, pain management, and patient education, does the patient require 24 hr/day rehab nursing? Yes Does the patient require coordinated care of a physician, rehab nurse, PT, OT, and SLP to address physical and functional deficits in the context of the above medical diagnosis(es)? Yes Addressing deficits in the following areas: balance, endurance, locomotion, strength, transferring, bowel/bladder control, bathing, dressing, feeding, grooming, toileting, cognition, speech, and swallowing Can the patient actively participate in an intensive therapy program of at least 3 hrs of therapy 5 days a week? Yes The potential for patient to make measurable  gains while on inpatient rehab is good and fair Anticipated functional outcomes upon discharge from inpatient rehab: min assist PT, min assist OT, supervision SLP Estimated rehab length of stay to reach  the above functional goals is: 14-20 days Anticipated discharge destination: Home 10. Overall Rehab/Functional Prognosis: good and fair   MD Signature:

## 2021-04-16 NOTE — TOC Initial Note (Signed)
Transition of Care (TOC) - Initial/Assessment Note  ? ? ?Patient Details  ?Name: Miguel Mccullough ?MRN: [id number] ?Date of Birth: 12-Dec-1965 ? ?Transition of Care [facility]) CM/SW Contact:    ?Ella Bodo, RN ?Phone Number: ?04/16/2021, 3:14 PM ? ?Clinical Narrative:                 ?Pt is 56 yo male who presents with L side weakness, L facial droop, and slurred speech. 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  ?PTA, pt independent and living at home with spouse.  PT/OT recommending CIR and consult in progress.  Will follow as pt progresses.  ? ?Expected Discharge Plan: [place] ?Barriers to Discharge: Continued Medical Work up ? ? ?  ?  ?  ? ?Expected Discharge Plan and Services ?Expected Discharge Plan: [place] ?  ?Discharge Planning Services: CM Consult ?  ?Living arrangements for the past 2 months: [place] ?                ?  ?  ?  ?  ?  ?  ?  ?  ?  ?  ? ?Prior Living Arrangements/Services ?Living arrangements for the past 2 months: [place] ?Lives with:: Spouse ?  ?       ?  ?  ?  ?  ? ?Activities of Daily Living ?Home Assistive Devices/Equipment: Eyeglasses ?ADL Screening (condition at time of admission) ?Patient's cognitive ability adequate to safely complete daily activities?: No ?Is the patient deaf or have difficulty hearing?: No ?Does the patient have difficulty seeing, even when wearing glasses/contacts?: No ?Does the patient have difficulty concentrating, remembering, or making decisions?: Yes ?Patient able to express need for assistance with ADLs?: Yes ?Does the patient have difficulty dressing or bathing?: Yes ?Independently performs ADLs?: No ?Communication: Independent ?Dressing (OT): Needs assistance ?Is this a change from baseline?: Change from baseline, expected to last >3 days ?Grooming: Needs assistance ?Is this a change from baseline?: Change from baseline, expected to last >3 days ?Feeding: Needs assistance ?Is this a change from  baseline?: Change from baseline, expected to last >3 days ?Bathing: Needs assistance ?Is this a change from baseline?: Change from baseline, expected to last >3 days ?Toileting: Needs assistance ?Is this a change from baseline?: Change from baseline, expected to last >3days ?In/Out Bed: Needs assistance ?Is this a change from baseline?: Change from baseline, expected to last >3 days ?Walks in Home: Needs assistance ?Is this a change from baseline?: Change from baseline, expected to last >3 days ?Does the patient have difficulty walking or climbing stairs?: Yes ?Weakness of Legs: Left ?Weakness of Arms/Hands: Left ? ?  ?  ?  ?  ?  ?  ? ?Admission diagnosis:  ICH (intracerebral hemorrhage) ([place]) [I61.9] ?Patient Active Problem List  ? Diagnosis Date Noted  ? ICH (intracerebral hemorrhage) ([place]) 04/13/2021  ? ?PCP:  Pcp, No ?Pharmacy:   ?[id number] [address] AT [facility] [address] ?[place] ?[place] [address] ?Phone: [phone] Fax: [phone] ? ? ? ? ?Social Determinants of Health (SDOH) Interventions ?  ? ?Readmission Risk Interventions ?No flowsheet data found. ? ?Reinaldo Raddle, RN, BSN  ?Trauma/Neuro ICU Case Manager ?[phone] ? ? ?

## 2021-04-16 NOTE — Progress Notes (Addendum)
STROKE TEAM PROGRESS NOTE  ? ?SUBJECTIVE (INTERVAL HISTORY) ?Miguel Mccullough is seen with wife, Miguel Mccullough, at the bedside.  Overall his condition is gradually improving. Reports improvements in dysarthria today, but weakness unchanged. ?Increase clonidine add PRN hydralazine and titrate down Cleviprex.  Once he is off of Cleviprex he can transfer out of the ICU. ? ?CTH w/ R basal ganglia intraparenchymal hematoma with intraventricular extension. Follow-up CTH unchanged. MRI redemonstrates stable hematoma.  ? ?OBJECTIVE ?CBC:  ?Recent Labs  ?Lab 04/13/21 ?2123 04/13/21 ?2128 04/15/21 ?1012 04/16/21 ?0750  ?WBC 7.9  --  13.6* 12.0*  ?NEUTROABS 5.1  --   --   --   ?HGB 14.4   < > 13.7 12.2*  ?HCT 41.9   < > 39.8 35.2*  ?MCV 85.7  --  85.8 85.2  ?PLT 191  --  209 186  ? < > = values in this interval not displayed.  ? ? ?Basic Metabolic Panel:  ?Recent Labs  ?Lab 04/13/21 ?2123 04/13/21 ?2128 04/15/21 ?0244  ?NA 138 141 136  ?K 3.2* 3.2* 3.1*  ?CL 99 100 99  ?CO2 27  --  23  ?GLUCOSE 105* 104* 122*  ?BUN 18 24* 24*  ?CREATININE 2.05* 2.10* 2.73*  ?CALCIUM 8.9  --  8.6*  ?MG  --   --  2.3  ? ? ?Lipid Panel:  ?Recent Labs  ?Lab 04/13/21 ?2247 04/15/21 ?0244  ?CHOL 203*  --   ?TRIG 248* 278*  ?HDL 59  --   ?CHOLHDL 3.4  --   ?VLDL 50*  --   ?[place] 94  --   ? ? ?HgbA1c:  ?Recent Labs  ?Lab 04/13/21 ?2123  ?HGBA1C 4.7*  ? ? ?Urine Drug Screen:  ?Recent Labs  ?Lab 04/14/21 ?0645  ?LABOPIA NONE DETECTED  ?COCAINSCRNUR NONE DETECTED  ?LABBENZ NONE DETECTED  ?AMPHETMU NONE DETECTED  ?THCU NONE DETECTED  ?LABBARB NONE DETECTED  ? ?  ?Alcohol Level  ?Recent Labs  ?Lab 04/13/21 ?2123  ?ETH <10  ? ? ? ?IMAGING past 24 hours ?No results found. ? ? ?PHYSICAL EXAM ?Temp:  [98.4 ?F (36.9 ?C)-99.8 ?F (37.7 ?C)] 99.1 ?F (37.3 ?C) (03/01 0800) ?Pulse Rate:  [72-108] 78 (03/01 0800) ?Resp:  [11-31] 17 (03/01 0800) ?BP: (110-184)/(53-128) 132/81 (03/01 0800) ?SpO2:  [89 %-100 %] 96 % (03/01 0800) ?Weight:  [77.6 kg] 77.6 kg (02/28 1300) ? ?General  - Well nourished, well developed, age congruent male in no apparent distress. ? ?Cardiovascular - Regular rhythm and rate on telemetry. ? ?Mental Status -  ?Level of arousal and orientation to time, place, and person were intact. ?Language including expression, naming, repetition, comprehension was assessed and found intact, only with mild dysarthria ? ?Cranial Nerves II - XII - ?II - Visual field intact OU. ?III, IV, VI - Extraocular movements intact, but Miguel Mccullough with R gaze preference ?V - Facial sensation intact bilaterally. ?VII - Facial movement with L facial droop ?VIII - Hearing & vestibular intact bilaterally. ?X - Palate elevates symmetrically. ?XII - Tongue protrusion with L deviation. ? ?Motor Strength - The Miguel Mccullough?s strength was normal in RU and RL extremities and pronator drift was absent. 1/5 LLE and 0/5 flaccid LUE. Bulk was normal and fasciculations were absent.   ?Motor Tone - Muscle tone was assessed and increased in LLE, but otherwise normal in other extremities. ? ?Sensory - Light touch was assessed and asymmetrical in LE with some L hemineglect. When touching L leg alone, able to identify but when touching both legs,  only says R. ? ?Coordination - The Miguel Mccullough had normal movements in the R hand and RLE with no ataxia or dysmetria.  Tremor was absent. ? ?Gait and Station - deferred. ? ? ? ?ASSESSMENT/PLAN ?Miguel Mccullough is a 56 y.o. male with history of HTN presenting with L sided weakness, numbness, and facial droop and dysarthria. SBP in 270s en route.  Resumed p.o. medications yesterday and today and have been able to titrate off the Cleviprex.  Plan to transfer out of ICU ? 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  ?CTA head & neck No vessel stenosis, occlusion, aneurysm, or dissection.  ?Follow-up CTH stable IPH. ?MRI  R basal ganglia hematoma with intraventricular  extension; stable from follow-up CT. Background small vessel ischemic gliosis with chronic microhemorrhages. ?2D Echo LVEF 70-75%, severe LVH, Grade II diastolic dysfunction, LA mild to mod dilated, trivial MVR, Tricuspid aortic valve with mild thickening.  ?LDL 94 ?HgbA1c 4.7% ?VTE prophylaxis - SCDs ?No antithrombotic prior to admission, now on No antithrombotic. Will not initiate therapy until Miguel Mccullough 5-7 days post-IPH, pending hematoma remains stable. ?Therapy recommendations:  AIR; needs continued SLP follow-up ?Disposition:  Pending ? ?Hypertensive Emergency ?Hypertensive Encephalopathy ?Home meds:  See in HPI; Non-compliant ?Stable overnight with maximal Cleviprex and PRN Labetalol ?Initiated oral anti-HTNsive meds today: Amlodipine 10 mg, Metoprolol 50 mg BID, and Clonidine 0.1 mg BID.  ?Cleviprex remains in place for additional BP coverage. Wean as tolerated. ?Goal SBP 160 ?Long-term BP goal normotensive ? ?Hyperlipidemia ?Home meds:  N/A ?LDL 94., goal < 70 ?Initiating Atorvastatin 40 mg ?High intensity statin indicated  ?Continue statin at discharge ? ?Diabetes type II Assessment ?Home meds:  N/A ?HgbA1c 4.7, goal < 7.0 ?CBGs ? ?Other Stroke Risk Factors ?ETOH use, alcohol level <10, advised to drink no more than 1-2 drink(s) a day ?Substance abuse - UDS:  THC NONE DETECTED, Cocaine NONE DETECTED.  ?Obesity, Body mass index is 29.35 kg/m?., BMI >/= 30 associated with increased stroke risk, recommend weight loss, diet and exercise as appropriate  ? ?Other Active Problems ?Severe LVH: recommend to follow-up outpatient with cardiology to assess for HCM. Will submit referral.  ? ?Hospital day # 3 ? ?Miguel Mccullough seen and examined by NP/APP with MD. MD to update note as needed.  ? ?Janine Ores, DNP, FNP-BC ?[facility] ?Pager: [phone] ? ?ATTENDING ATTESTATION: ?  ?56 year old past medical history of recurrent hypertension noncompliant medications presenting with left-sided numbness facial droop  slurred speech with right basal ganglia hemorrhage with intraventricular extension.  Exam consists of dysarthria and left hemiplegia and unchanged.  ?His blood pressure is improving off of Cleviprex at 10 this morning.  On recheck later in the afternoon he was still doing well.  He will be transition out of the ICU to the hospitalist service.  Clonidine increased to 0.2 mg 3 times daily.  As needed hydralazine and labetalol on board.  Systolic blood pressure less than 160 goal. ? ?Neurology will continue to follow.   ? ?Dr. Reeves Forth evaluated pt independently, reviewed imaging, chart, labs. Discussed and formulated plan with the APP. Please see APP note above for details.   ?  ?This Miguel Mccullough is critically ill due to [place], hypertension requiring ggt and at significant risk of neurological worsening, death form heart failure, respiratory failure, recurrent stroke, bleeding from [facility], seizure, sepsis. This Miguel Mccullough's care requires constant monitoring of vital signs, hemodynamics, respiratory and  cardiac monitoring, review of multiple databases, neurological assessment, discussion with family, other specialists and medical decision making of high complexity. I spent 35 minutes of neurocritical care time in the care of this Miguel Mccullough.  ?  ?Idania Desouza,MD  ? ? ?To contact Stroke Continuity provider, please refer to [email or web address] ?After hours, contact General Neurology  ?

## 2021-04-17 ENCOUNTER — Encounter (HOSPITAL_COMMUNITY): Payer: Self-pay | Admitting: Student in an Organized Health Care Education/Training Program

## 2021-04-17 ENCOUNTER — Encounter (HOSPITAL_COMMUNITY): Payer: Self-pay | Admitting: Physical Medicine and Rehabilitation

## 2021-04-17 ENCOUNTER — Inpatient Hospital Stay (HOSPITAL_COMMUNITY)
Admission: RE | Admit: 2021-04-17 | Discharge: 2021-05-15 | DRG: 057 | Disposition: A | Discharge status: Home Health | Payer: Self-pay | Source: Intra-hospital | Attending: Physical Medicine and Rehabilitation | Admitting: Physical Medicine and Rehabilitation

## 2021-04-17 ENCOUNTER — Inpatient Hospital Stay (HOSPITAL_COMMUNITY): Payer: Self-pay

## 2021-04-17 ENCOUNTER — Other Ambulatory Visit: Payer: Self-pay

## 2021-04-17 DIAGNOSIS — E876 Hypokalemia: Secondary | ICD-10-CM | POA: Diagnosis not present

## 2021-04-17 DIAGNOSIS — I619 Nontraumatic intracerebral hemorrhage, unspecified: Secondary | ICD-10-CM | POA: Diagnosis present

## 2021-04-17 DIAGNOSIS — Z8616 Personal history of COVID-19: Secondary | ICD-10-CM

## 2021-04-17 DIAGNOSIS — I69319 Unspecified symptoms and signs involving cognitive functions following cerebral infarction: Secondary | ICD-10-CM

## 2021-04-17 DIAGNOSIS — G4700 Insomnia, unspecified: Secondary | ICD-10-CM | POA: Diagnosis not present

## 2021-04-17 DIAGNOSIS — N184 Chronic kidney disease, stage 4 (severe): Secondary | ICD-10-CM | POA: Diagnosis present

## 2021-04-17 DIAGNOSIS — I69354 Hemiplegia and hemiparesis following cerebral infarction affecting left non-dominant side: Principal | ICD-10-CM

## 2021-04-17 DIAGNOSIS — D62 Acute posthemorrhagic anemia: Secondary | ICD-10-CM | POA: Diagnosis present

## 2021-04-17 DIAGNOSIS — I69392 Facial weakness following cerebral infarction: Secondary | ICD-10-CM

## 2021-04-17 DIAGNOSIS — I129 Hypertensive chronic kidney disease with stage 1 through stage 4 chronic kidney disease, or unspecified chronic kidney disease: Secondary | ICD-10-CM | POA: Diagnosis present

## 2021-04-17 DIAGNOSIS — I69391 Dysphagia following cerebral infarction: Secondary | ICD-10-CM

## 2021-04-17 DIAGNOSIS — I10 Essential (primary) hypertension: Secondary | ICD-10-CM

## 2021-04-17 DIAGNOSIS — K5900 Constipation, unspecified: Secondary | ICD-10-CM | POA: Diagnosis not present

## 2021-04-17 DIAGNOSIS — F32A Depression, unspecified: Secondary | ICD-10-CM | POA: Diagnosis present

## 2021-04-17 DIAGNOSIS — Z8249 Family history of ischemic heart disease and other diseases of the circulatory system: Secondary | ICD-10-CM

## 2021-04-17 DIAGNOSIS — E785 Hyperlipidemia, unspecified: Secondary | ICD-10-CM | POA: Diagnosis present

## 2021-04-17 DIAGNOSIS — R1312 Dysphagia, oropharyngeal phase: Secondary | ICD-10-CM | POA: Diagnosis present

## 2021-04-17 DIAGNOSIS — Z91199 Patient's noncompliance with other medical treatment and regimen due to unspecified reason: Secondary | ICD-10-CM

## 2021-04-17 DIAGNOSIS — D72829 Elevated white blood cell count, unspecified: Secondary | ICD-10-CM | POA: Diagnosis not present

## 2021-04-17 DIAGNOSIS — N179 Acute kidney failure, unspecified: Secondary | ICD-10-CM | POA: Diagnosis present

## 2021-04-17 DIAGNOSIS — R252 Cramp and spasm: Secondary | ICD-10-CM

## 2021-04-17 DIAGNOSIS — I610 Nontraumatic intracerebral hemorrhage in hemisphere, subcortical: Secondary | ICD-10-CM

## 2021-04-17 LAB — CBC
HCT: 36.5 % — ABNORMAL LOW (ref 39.0–52.0)
Hemoglobin: 12.6 g/dL — ABNORMAL LOW (ref 13.0–17.0)
MCH: 29.9 pg (ref 26.0–34.0)
MCHC: 34.5 g/dL (ref 30.0–36.0)
MCV: 86.5 fL (ref 80.0–100.0)
Platelets: 183 10*3/uL (ref 150–400)
RBC: 4.22 MIL/uL (ref 4.22–5.81)
RDW: 13.6 % (ref 11.5–15.5)
WBC: 10.5 10*3/uL (ref 4.0–10.5)
nRBC: 0 % (ref 0.0–0.2)

## 2021-04-17 LAB — PROTEIN / CREATININE RATIO, URINE
Creatinine, Urine: 130.57 mg/dL
Protein Creatinine Ratio: 0.18 mg/mg{Cre} — ABNORMAL HIGH (ref 0.00–0.15)
Total Protein, Urine: 24 mg/dL

## 2021-04-17 LAB — BASIC METABOLIC PANEL
Anion gap: 12 (ref 5–15)
BUN: 28 mg/dL — ABNORMAL HIGH (ref 6–20)
CO2: 27 mmol/L (ref 22–32)
Calcium: 8.3 mg/dL — ABNORMAL LOW (ref 8.9–10.3)
Chloride: 99 mmol/L (ref 98–111)
Creatinine, Ser: 2.41 mg/dL — ABNORMAL HIGH (ref 0.61–1.24)
GFR, Estimated: 31 mL/min — ABNORMAL LOW (ref >60)
Glucose, Bld: 95 mg/dL (ref 70–99)
Potassium: 3.2 mmol/L — ABNORMAL LOW (ref 3.5–5.1)
Sodium: 138 mmol/L (ref 135–145)

## 2021-04-17 MED ORDER — BISACODYL 10 MG RE SUPP: 10.0000 mg | Freq: Every day | RECTAL | Status: DC | PRN

## 2021-04-17 MED ORDER — TRAZODONE HCL 50 MG PO TABS: 25.0000 mg | ORAL_TABLET | Freq: Every evening | ORAL | Status: DC | PRN

## 2021-04-17 MED ORDER — POLYETHYLENE GLYCOL 3350 17 G PO PACK
17.0000 g | PACK | Freq: Every day | ORAL | Status: DC | PRN
Start: 2021-04-17 — End: 2021-05-15

## 2021-04-17 MED ORDER — HYDRALAZINE HCL 10 MG PO TABS
10.0000 mg | ORAL_TABLET | Freq: Four times a day (QID) | ORAL | Status: DC | PRN
  Administered 2021-04-18 – 2021-04-19 (×3): 10 mg via ORAL
  Filled 2021-04-17 (×4): qty 1

## 2021-04-17 MED ORDER — ALUM & MAG HYDROXIDE-SIMETH 200-200-20 MG/5ML PO SUSP: 30.0000 mL | ORAL | Status: DC | PRN

## 2021-04-17 MED ORDER — PROCHLORPERAZINE EDISYLATE 10 MG/2ML IJ SOLN: 5.0000 mg | Freq: Four times a day (QID) | INTRAMUSCULAR | Status: DC | PRN

## 2021-04-17 MED ORDER — POTASSIUM CHLORIDE CRYS ER 20 MEQ PO TBCR
20.0000 meq | EXTENDED_RELEASE_TABLET | Freq: Every day | ORAL | Status: DC
  Administered 2021-04-17 – 2021-05-12 (×26): 20 meq via ORAL
  Filled 2021-04-17 (×27): qty 1

## 2021-04-17 MED ORDER — CLONIDINE HCL 0.2 MG PO TABS
0.2000 mg | ORAL_TABLET | Freq: Three times a day (TID) | ORAL | Status: DC
  Administered 2021-04-17 – 2021-05-06 (×56): 0.2 mg via ORAL
  Filled 2021-04-17 (×56): qty 1

## 2021-04-17 MED ORDER — ATORVASTATIN CALCIUM 40 MG PO TABS
40.0000 mg | ORAL_TABLET | Freq: Every day | ORAL | Status: DC
  Administered 2021-04-17 – 2021-05-14 (×28): 40 mg via ORAL
  Filled 2021-04-17 (×28): qty 1

## 2021-04-17 MED ORDER — PROCHLORPERAZINE MALEATE 5 MG PO TABS
5.0000 mg | ORAL_TABLET | Freq: Four times a day (QID) | ORAL | Status: DC | PRN
  Administered 2021-04-19: 5 mg via ORAL
  Filled 2021-04-17: qty 2

## 2021-04-17 MED ORDER — SENNOSIDES-DOCUSATE SODIUM 8.6-50 MG PO TABS
2.0000 | ORAL_TABLET | Freq: Every day | ORAL | Status: DC
  Administered 2021-04-21 – 2021-05-14 (×12): 2 via ORAL
  Filled 2021-04-17 (×25): qty 2

## 2021-04-17 MED ORDER — ACETAMINOPHEN 325 MG PO TABS
325.0000 mg | ORAL_TABLET | ORAL | Status: DC | PRN
  Administered 2021-04-19 – 2021-04-30 (×5): 650 mg via ORAL
  Filled 2021-04-17 (×7): qty 2

## 2021-04-17 MED ORDER — POTASSIUM CHLORIDE CRYS ER 20 MEQ PO TBCR
40.0000 meq | EXTENDED_RELEASE_TABLET | Freq: Once | ORAL | Status: AC
  Administered 2021-04-17: 40 meq via ORAL
  Filled 2021-04-17: qty 2

## 2021-04-17 MED ORDER — PROCHLORPERAZINE 25 MG RE SUPP: 12.5000 mg | Freq: Four times a day (QID) | RECTAL | Status: DC | PRN

## 2021-04-17 MED ORDER — GUAIFENESIN-DM 100-10 MG/5ML PO SYRP: 5.0000 mL | ORAL_SOLUTION | Freq: Four times a day (QID) | ORAL | Status: DC | PRN

## 2021-04-17 MED ORDER — PANTOPRAZOLE SODIUM 40 MG PO TBEC
40.0000 mg | DELAYED_RELEASE_TABLET | Freq: Every day | ORAL | Status: DC
  Administered 2021-04-17 – 2021-05-14 (×28): 40 mg via ORAL
  Filled 2021-04-17 (×27): qty 1

## 2021-04-17 MED ORDER — FLEET ENEMA 7-19 GM/118ML RE ENEM: 1.0000 | ENEMA | Freq: Once | RECTAL | Status: DC | PRN

## 2021-04-17 MED ORDER — METOPROLOL TARTRATE 50 MG PO TABS
50.0000 mg | ORAL_TABLET | Freq: Two times a day (BID) | ORAL | Status: DC
  Administered 2021-04-17 – 2021-04-18 (×3): 50 mg via ORAL
  Filled 2021-04-17 (×3): qty 1

## 2021-04-17 MED ORDER — CLONIDINE HCL 0.1 MG PO TABS
0.1000 mg | ORAL_TABLET | ORAL | Status: DC | PRN
  Administered 2021-04-18 – 2021-04-20 (×5): 0.1 mg via ORAL
  Filled 2021-04-17 (×7): qty 1

## 2021-04-17 MED ORDER — AMLODIPINE BESYLATE 10 MG PO TABS
10.0000 mg | ORAL_TABLET | Freq: Every day | ORAL | Status: DC
  Administered 2021-04-18 – 2021-05-04 (×17): 10 mg via ORAL
  Filled 2021-04-17 (×17): qty 1

## 2021-04-17 MED ORDER — DIPHENHYDRAMINE HCL 12.5 MG/5ML PO ELIX: 12.5000 mg | ORAL_SOLUTION | Freq: Four times a day (QID) | ORAL | Status: DC | PRN

## 2021-04-17 MED ORDER — ENOXAPARIN SODIUM 40 MG/0.4ML IJ SOSY
40.0000 mg | PREFILLED_SYRINGE | INTRAMUSCULAR | Status: DC
  Administered 2021-04-17 – 2021-05-14 (×28): 40 mg via SUBCUTANEOUS
  Filled 2021-04-17 (×29): qty 0.4

## 2021-04-17 NOTE — Progress Notes (Addendum)
Tele called to report pt had ST elevation 3.1 mmcl & 2.7 v-lead. Assessed pt, took full VS, noted elevated BP 160/80, he is sleeping in his bed denies chest pain and pressure. Just had a bowel movement 10 mins ago and denies straining during BM. Appears to be resting comfortably as this time. Messaged  Dr. Bridgett Larsson via [facility].  ?

## 2021-04-17 NOTE — Progress Notes (Signed)
Physical Therapy Treatment ?Patient Details ?Name: Miguel Mccullough ?MRN: [id number] ?DOB: 03-May-1965 ?Today's Date: 04/17/2021 ? ? ?History of Present Illness Pt is 56 yo male who presents with L side weakness, L facial droop, and slurred speech. 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 PMH: HTN and covid ? ?  ?PT Comments  ? ? Focus of session was today functional bed mobility, transfers with the steady, standing, and weight bearing tolerance. The patient tolerated well.  Pt. Shows overall improvement with his cognition and functional mobility. L inattention, hemiparesis, and awareness of deficits are still limiting function. Pt. Would benefit from skilled PT to continue to address his limited use of his L side and improve functional capacity. Plan and discharge setting remains unchanged. Pt to follow acutely as appropriate.  ?   ?Recommendations for follow up therapy are one component of a multi-disciplinary discharge planning process, led by the attending physician.  Recommendations may be updated based on patient status, additional functional criteria and insurance authorization. ? ?Follow Up Recommendations ? Acute inpatient rehab (3hours/day) ?  ?  ?Assistance Recommended at Discharge Frequent or constant Supervision/Assistance  ?Patient can return home with the following Two people to help with walking and/or transfers;Assistance with cooking/housework;A lot of help with bathing/dressing/bathroom;Assist for transportation;Help with stairs or ramp for entrance;Assistance with feeding ?  ?Equipment Recommendations ? Wheelchair (measurements PT);Wheelchair cushion (measurements PT)  ?  ?Recommendations for Other Services Rehab consult ? ? ?  ?Precautions / Restrictions Precautions ?Precautions: Fall ?Precaution Comments: L hemiplegia ?Restrictions ?Weight Bearing Restrictions: No  ?  ? ?Mobility ? Bed Mobility ?Overal bed mobility: Needs Assistance ?Bed Mobility: Rolling, Supine to Sit, Sit to  Supine ?Rolling: Min assist ?  ?Supine to sit: Max assist ?Sit to supine: Max assist ?  ?General bed mobility comments: Verbal cues for sequencing and assisting with L LE & UE ?  ? ?Transfers ?Overall transfer level: Needs assistance ?Equipment used:  (steady) ?Transfers: Sit to/from Stand ?Sit to Stand: Mod assist ?  ?  ?  ?  ?  ?General transfer comment: STS x7 with steady, trasnfer with steady. Mod A for power up, L UE and LE management. L quad and glute facilitation ?  ? ?Ambulation/Gait ?  ?  ?  ?  ?  ?  ?Pre-gait activities: weight shifting, "hullahooping", and reach tasks, with modA for L quad and glute facilitation ?General Gait Details: unable ? ? ?Stairs ?  ?  ?  ?  ?  ? ? ?Wheelchair Mobility ?  ? ?Modified Rankin (Stroke Patients Only) ?Modified Rankin (Stroke Patients Only) ?Pre-Morbid Rankin Score: No symptoms ?Modified Rankin: Severe disability ? ? ?  ?Balance Overall balance assessment: Needs assistance ?Sitting-balance support: Single extremity supported, Feet supported ?Sitting balance-Leahy Scale: Poor ?Sitting balance - Comments: requires RUE support to maintain sitting balance ?Postural control: Posterior lean, Left lateral lean ?Standing balance support: Bilateral upper extremity supported ?Standing balance-Leahy Scale: Poor ?Standing balance comment: mod A for L quad and glute facilitation ?  ?  ?  ?  ?  ?  ?  ?  ?  ?  ?  ?  ? ?  ?Cognition Arousal/Alertness: Awake/alert ?Behavior During Therapy: [facility] for tasks assessed/performed ?Overall Cognitive Status: Impaired/Different from baseline ?Area of Impairment: Awareness ?  ?  ?  ?  ?  ?  ?  ?  ?  ?Current Attention Level: Focused ?  ?Following Commands: Follows one step commands consistently, Follows multi-step commands inconsistently ?Safety/Judgement: Decreased  awareness of safety, Decreased awareness of deficits ?Awareness: Emergent ?Problem Solving: Slow processing, Decreased initiation ?General Comments: L inattention, required cues to  position LUE ?  ?  ? ?  ?Exercises   ? ?  ?General Comments   ?  ?  ? ?Pertinent Vitals/Pain Pain Assessment ?Pain Assessment: No/denies pain  ? ? ?Home Living   ?  ?  ?  ?  ?  ?  ?  ?  ?  ?   ?  ?Prior Function    ?  ?  ?   ? ?PT Goals (current goals can now be found in the care plan section) Acute Rehab PT Goals ?Patient Stated Goal: return to independence ?PT Goal Formulation: With patient/family ?Time For Goal Achievement: 04/29/21 ?Potential to Achieve Goals: Good ?Progress towards PT goals: Progressing toward goals ? ?  ?Frequency ? ? ? Min 4X/week ? ? ? ?  ?PT Plan Current plan remains appropriate  ? ? ?Co-evaluation   ?  ?  ?  ?  ? ?  ?AM-PAC PT "6 Clicks" Mobility   ?Outcome Measure ? Help needed turning from your back to your side while in a flat bed without using bedrails?: A Little ?Help needed moving from lying on your back to sitting on the side of a flat bed without using bedrails?: A Lot ?Help needed moving to and from a bed to a chair (including a wheelchair)?: A Lot ?Help needed standing up from a chair using your arms (e.g., wheelchair or bedside chair)?: A Lot ?Help needed to walk in hospital room?: Total ?Help needed climbing 3-5 steps with a railing? : Total ?6 Click Score: 11 ? ?  ?End of Session   ?Activity Tolerance: Patient tolerated treatment well ?Patient left: in chair;with call bell/phone within reach;with family/visitor present ?Nurse Communication: Mobility status ?PT Visit Diagnosis: Hemiplegia and hemiparesis;Difficulty in walking, not elsewhere classified (R26.2) ?Hemiplegia - Right/Left: Left ?Hemiplegia - dominant/non-dominant: Non-dominant ?Hemiplegia - caused by: Nontraumatic intracerebral hemorrhage ?  ? ? ?Time: 8032-1224 ?PT Time Calculation (min) (ACUTE ONLY): 35 min ? ?Charges:  $Therapeutic Activity: 8-22 mins ?$Neuromuscular Re-education: 8-22 mins          ?          ? ?Thermon Leyland, SPT ?Acute Rehab Services ? ? ? ?Thermon Leyland ?04/17/2021, 2:06 PM ? ?

## 2021-04-17 NOTE — Evaluation (Signed)
Occupational Therapy Assessment and Plan  Patient Details  Name: Miguel Mccullough MRN: [id number] Date of Birth: 10/28/1965  OT Diagnosis: abnormal posture, flaccid hemiplegia and hemiparesis, hemiplegia affecting non-dominant side, and muscle weakness (generalized) Rehab Potential: Rehab Potential (ACUTE ONLY): Excellent ELOS: 3 to 4 weeks   Today's Date: 04/18/2021 OT Individual Time: 0109-3235 OT Individual Time Calculation (min): 77 min     Hospital Problem: Principal Problem:   ICH (intracerebral hemorrhage) ([place])   Past Medical History:  Past Medical History:  Diagnosis Date   COVID-19 2021   continues to have increase in suptum production.   Hypertension    Past Surgical History: History reviewed. No pertinent surgical history.  Assessment & Plan Clinical Impression:  Miguel Mccullough is a 56 year old R handed  male with history of HTN but no medications (had stopped meds and not followed up with PCP) who was admitted on 04/13/21 with sudden onset of slurred speech with left-sided numbness followed by weakness and BP noted to be elevated in 270s in route to the hospital.  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  CTA head/neck was negative for aneurysm, dissection, occlusion or significant stenosis.  He was started on Cleviprex for BP control with follow-up CT head showing no change in IPH.  2D echo done showing EF 70 to 75% with severe LVH and grade 2 diastolic dysfunction as well as mild to moderate LA dilatation.  He with question of CKD and had worsening of renal status with follow-up renal ultrasound done 03/02 showing minimal increase in echogenicity.  He also developed significant drop in potassium requiring multiple runs of K as well as oral supplementation.  He was weaned off Cleviprex yesterday and BP medications being titrated for SBP goal < 160.  He was noted to have cognitive deficits with poor safety awareness as  well as mild oropharyngeal dysphagia with premature spillage and currently tolerating regular diet.  Patient with resultant left-sided weakness with numbness, left facial droop with slurred speech as well as mild dysphagia affecting overall functional status.  CIR was recommended due to functional decline.  Patient currently requires total with basic self-care skills secondary to muscle weakness and muscle paralysis, decreased cardiorespiratoy endurance, abnormal tone, unbalanced muscle activation, decreased coordination, and decreased motor planning, decreased midline orientation and decreased attention to left, and decreased sitting balance, decreased standing balance, decreased postural control, hemiplegia, and decreased balance strategies.  Prior to hospitalization, patient could complete BADLs with independent .  Patient will benefit from skilled intervention to increase independence with basic self-care skills prior to discharge  home with family support .  Anticipate patient will require Minimal assistance and 24 hour supervision and follow up home health.  OT - End of Session Endurance Deficit: Yes Endurance Deficit Description: Pt requiring rest breaks during ADL activity, reported to OT that he was feeling "very tired" but didn't understand why OT Assessment Rehab Potential (ACUTE ONLY): Excellent OT Barriers to Discharge: Home environment access/layout OT Patient demonstrates impairments in the following area(s): Balance;Perception;Safety;Behavior;Endurance;Motor OT Basic ADL's Functional Problem(s): Grooming;Bathing;Dressing;Toileting OT Advanced ADL's Functional Problem(s): Simple Meal Preparation OT Transfers Functional Problem(s): Toilet;Tub/Shower OT Additional Impairment(s): Fuctional Use of Upper Extremity OT Plan OT Intensity: Minimum of 1-2 x/day, 45 to 90 minutes OT Frequency: 5 out of 7 days OT Duration/Estimated Length of Stay: 3 to 4 weeks OT Treatment/Interventions:  Balance/vestibular training;Functional electrical stimulation;Discharge planning;Pain management;Self Care/advanced ADL retraining;Therapeutic Activities;UE/LE Coordination activities;Therapeutic Exercise;Patient/family education;Functional mobility training;Disease mangement/prevention;Cognitive  remediation/compensation;Community reintegration;DME/adaptive equipment instruction;Neuromuscular re-education;Psychosocial support;Splinting/orthotics;UE/LE Strength taining/ROM;Wheelchair propulsion/positioning OT Self Feeding Anticipated Outcome(s): no goal OT Basic Self-Care Anticipated Outcome(s): Supervision-Min A OT Toileting Anticipated Outcome(s): Min A OT Bathroom Transfers Anticipated Outcome(s): Min A OT Recommendation Recommendations for Other Services: Therapeutic Recreation consult Therapeutic Recreation Interventions: Stress management Patient destination: Home Follow Up Recommendations: Home health OT Equipment Recommended: To be determined   OT Evaluation Precautions/Restrictions  Precautions Precautions: Fall Precaution Comments: Flaccid Lt hemi, hx scoliosis Restrictions Weight Bearing Restrictions: No General   Vital Signs  Pain Pain Assessment Pain Scale: 0-10 Pain Score: 0-No pain Home Living/Prior Functioning Home Living Family/patient expects to be discharged to:: Private residence Living Arrangements: Spouse/significant other, Children Available Help at Discharge: Available 24 hours/day, Family Type of Home: House Home Access: Stairs to enter Technical brewer of Steps: 5 Home Layout: Two level, Bed/bath upstairs, 1/2 bath on main level Bathroom Shower/Tub: Multimedia programmer: Standard Bathroom Accessibility: Yes Additional Comments: Per wife, pt able to reside on main level upon d/c home, 1/2 bath on main level is walker accessible with standard toilet (no shower)  Lives With: Spouse, Family (spouse [name], 73 y/o daughter, and grandbaby  Halo) IADL History Homemaking Responsibilities: Yes (Per pt and wife, pt fully independent PTA with all aspects of IADLs as needed, ran a cleaning business, drove, assisted with caring for infant grandchild, and was a Physiological scientist) Occupation: Full time employment 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 Prior Function Level of Independence: Independent with homemaking with ambulation Driving: Yes Vision Baseline Vision/History: 1 Wears glasses Ability to See in Adequate Light: 0 Adequate Patient Visual Report: No change from baseline Alignment/Gaze Preference: Gaze right Perception  Perception: Impaired Inattention/Neglect: Does not attend to left visual field;Does not attend to left side of body Praxis Praxis: Impaired Praxis Impairment Details: Initiation;Motor planning Cognition Arousal/Alertness: Awake/alert Orientation Level: Person;Place;Situation Person: Oriented Place: Oriented Situation: Oriented Year: 2023 Month: March Day of Week: Correct Immediate Memory Recall: Blue;Bed;Sock Memory Recall Sock: Without Cue Memory Recall Blue: Without Cue Memory Recall Bed: Without Cue Attention: Sustained Sustained Attention: Impaired Awareness: Impaired Awareness Impairment: Emergent impairment Behaviors: Impulsive Safety/Judgment: Impaired Comments: Due to impulsivity with functional movement, impaired emergent awareness, and Lt inattention Sensation Sensation Light Touch: Appears Intact (Lt UE) Coordination Gross Motor Movements are Fluid and Coordinated: No Fine Motor Movements are Fluid and Coordinated: No Coordination and Movement Description: Affected by flaccid Lt hemiparesis and trunk control deficits Finger Nose Finger Test: WNL on the Rt side, unable to complete on the Lt Motor  Motor Motor: Hemiplegia;Abnormal tone;Abnormal postural alignment and control  Trunk/Postural Assessment  Cervical  Assessment Cervical Assessment: Exceptions to [facility] (Rt lateral flexion) Thoracic Assessment Thoracic Assessment: Exceptions to [facility] (scoliosis) Lumbar Assessment Lumbar Assessment: Exceptions to [facility] (posterior tilting) Postural Control Postural Control:  (impaired and insufficient during unsupported sitting and supported standing self care activity)  Balance Balance Balance Assessed: Yes Dynamic Sitting Balance Dynamic Sitting - Balance Support: During functional activity Dynamic Sitting - Level of Assistance: 1: +2 Total assist (washing Lt foot sitting EOB, therapist assisting with maintaining figure 4 position to meet task demands while wife assisted with posterior support to prevent LOBs) Dynamic Standing Balance Dynamic Standing - Balance Support: During functional activity Dynamic Standing - Level of Assistance: 1: +2 Total assist Dynamic Standing - Balance Activities: Lateral lean/weight shifting;Forward lean/weight shifting (Stedy for toilet transfer) Extremity/Trunk Assessment RUE Assessment RUE Assessment: Within Functional Limits Active Range of Motion (AROM) Comments:  WNL LUE Assessment LUE Assessment: Exceptions to [facility] LUE Body System: Neuro Brunstrum levels for arm and hand: Arm;Hand Brunstrum level for arm: Stage I Presynergy Brunstrum level for hand: Stage I Flaccidity LUE Tone LUE Tone: Flaccid  Care Tool Care Tool Self Care Eating        Oral Care    Oral Care Assist Level: Maximal assistance - Patient 25 - 49%    Bathing   Body parts bathed by patient: Chest;Abdomen;Front perineal area;Right upper leg;Left upper leg;Face Body parts bathed by helper: Right arm;Left arm;Buttocks;Right lower leg;Left lower leg   Assist Level: 2 Helpers (sit<stand in [place] lift)    Upper Body Dressing(including orthotics)   What is the patient wearing?: Pull over shirt   Assist Level: Maximal Assistance - Patient 25 - 49%    Lower Body Dressing (excluding footwear)    What is the patient wearing?: Incontinence brief;Pants Assist for lower body dressing: 2 Helpers    Putting on/Taking off footwear   What is the patient wearing?: Ted hose;Non-skid slipper socks Assist for footwear: Total Assistance - Patient < 25%       Care Tool Toileting Toileting activity   Assist for toileting: 2 Helpers     Care Tool Bed Mobility Roll left and right activity        Sit to lying activity        Lying to sitting on side of bed activity         Care Tool Transfers Sit to stand transfer        Chair/bed transfer         Toilet transfer   Assist Level: 2 Helpers (using Stedy lift)     Care Tool Cognition  Expression of Ideas and Wants    Understanding Verbal and Non-Verbal Content     Memory/Recall Ability Memory/Recall Ability : Current season;Staff names and faces;That he or she is in a hospital/hospital unit   Refer to Care Plan for [place] 1 OT Short Term Goal 1 (Week 1): Pt will thread Lt UE into shirt with Mod A and vcs to improve Lt attention OT Short Term Goal 2 (Week 1): Pt will verbalize 2 joint protection strategies to use during bed mobility and/or transfers to improve Lt attention OT Short Term Goal 3 (Week 1): Education will be provided to pts wife in regards to 2 hemi positioning strategies to implement at home for caregiver education  Recommendations for other services: Therapeutic Recreation  Stress management   Skilled Therapeutic Intervention Skilled OT session completed with focus placed on initial evaluation, education on OT role/POC and establishment of patient-centered goals.  Pt greeted in bed with no c/o pain. Wife [name] at bedside. Pt with no c/o pain, motivated to start his therapy. Max A for supine<sit due to flaccid Lt hemiparesis and Lt inattention (pt rising to sitting on his Lt side). While sitting, pt with Lt posterior lean/LOBs needing Mod-Max A for balance, able to improve  for short windows of time with vcs. He also has scoliosis at baseline. Pt able to hold onto the baseboard of the bed and maintain static sitting with close supervision at needed. Wife assisting as +2 as needed throughout session, which was very, very helpful and needed for safety due to pts functional deficits. Hand over hand to incorporate Lt arm during all tasks due to flaccidity. Lt LE also appearing flaccid as well. Max cues and assist to scan/turn head towards Lt  side to find needed ADL items. +2 for toilet transfer using bariatric Stedy, pt needing Mod balance assist in [place] due to truncal weakness/impairments and attention deficits. Pt with B+B void. Total A for 3/3 components of toileting tasks. Pt transferred to the w/c to complete oral care with Max A overall. He was very fatigued at this point in the session. Exhibited excellent effort and participation today. Pt agreeable to remain sitting up for PT eval. Left him with all needs within reach and safety belt fastened.   ADL Mobility  Transfers Sit to Stand: Moderate Assistance - Patient 50-74% Stand to Sit: Moderate Assistance - Patient 50-74%   Discharge Criteria: Patient will be discharged from OT if patient refuses treatment 3 consecutive times without medical reason, if treatment goals not met, if there is a change in medical status, if patient makes no progress towards goals or if patient is discharged from hospital.  The above assessment, treatment plan, treatment alternatives and goals were discussed and mutually agreed upon: by patient and by family  Skeet Simmer 04/18/2021, 12:45 PM

## 2021-04-17 NOTE — Progress Notes (Addendum)
STROKE TEAM PROGRESS NOTE  ? ?SUBJECTIVE (INTERVAL HISTORY) ?Patient is seen with wife, [name], at the bedside.  Overall his condition is gradually improving. 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 Follow-up CTH unchanged. MRI redemonstrates stable hematoma.  ? ?Discharge to CIR today. BP on the higher end. SBP under 160.  ? ?OBJECTIVE ?CBC:  ?Recent Labs  ?Lab 04/13/21 ?2123 04/13/21 ?2128 04/16/21 ?1834 04/17/21 ?0209  ?WBC 7.9   < > 11.3* 10.5  ?NEUTROABS 5.1  --   --   --   ?HGB 14.4   < > 13.0 12.6*  ?HCT 41.9   < > 38.0* 36.5*  ?MCV 85.7   < > 86.6 86.5  ?PLT 191   < > 182 183  ? < > = values in this interval not displayed.  ? ? ?Basic Metabolic Panel:  ?Recent Labs  ?Lab 04/15/21 ?0244 04/16/21 ?1834 04/17/21 ?0209  ?NA 136 136 138  ?K 3.1* 3.2* 3.2*  ?CL 99 100 99  ?CO2 23 25 27   ?GLUCOSE 122* 137* 95  ?BUN 24* 28* 28*  ?CREATININE 2.73* 2.57* 2.41*  ?CALCIUM 8.6* 8.1* 8.3*  ?MG 2.3  --   --   ? ? ?Lipid Panel:  ?Recent Labs  ?Lab 04/13/21 ?2247 04/15/21 ?0244 04/16/21 ?0750  ?CHOL 203*  --   --   ?TRIG 248*   < > 78  ?HDL 59  --   --   ?CHOLHDL 3.4  --   --   ?VLDL 50*  --   --   ?LDLCALC 94  --   --   ? < > = values in this interval not displayed.  ? ? ?HgbA1c:  ?Recent Labs  ?Lab 04/13/21 ?2123  ?HGBA1C 4.7*  ? ? ?Urine Drug Screen:  ?Recent Labs  ?Lab 04/14/21 ?0645  ?LABOPIA NONE DETECTED  ?COCAINSCRNUR NONE DETECTED  ?LABBENZ NONE DETECTED  ?AMPHETMU NONE DETECTED  ?THCU NONE DETECTED  ?LABBARB NONE DETECTED  ? ?  ?Alcohol Level  ?Recent Labs  ?Lab 04/13/21 ?2123  ?ETH <10  ? ? ? ?IMAGING past 24 hours ?US RENAL ? ?Result Date: 04/17/2021 ?CLINICAL DATA:  AK I EXAM: RENAL / URINARY TRACT ULTRASOUND COMPLETE COMPARISON:  None. FINDINGS: Right Kidney: Renal measurements: 8.5 x 4.9 x 4.7 cm = volume: 103 mL. Echogenicity appears minimally increased, possibly due to technical related factors. No mass or hydronephrosis visualized. Left Kidney: Renal measurements:  9.1 x 5.3 x 5.2 cm = volume: 132 mL. Echogenicity appears minimally increased, possibly due to technical related factors. No mass or hydronephrosis visualized. Bladder: Appears normal for degree of bladder distention. Other: None. IMPRESSION: No hydronephrosis. Electronically Signed   By: Valentino Saxon M.D.   On: 04/17/2021 10:20   ? ? ?PHYSICAL EXAM ?Temp:  [97.8 ?F (36.6 ?C)-99.1 ?F (37.3 ?C)] 99 ?F (37.2 ?C) (03/02 0935) ?Pulse Rate:  [67-81] 70 (03/02 1100) ?Resp:  [14-24] 17 (03/02 0935) ?BP: (134-173)/(80-111) 156/98 (03/02 1100) ?SpO2:  [93 %-99 %] 99 % (03/02 0935) ? ?General - Well nourished, well developed, age congruent male in no apparent distress. ? ?Cardiovascular - Regular rhythm and rate on telemetry. ? ?Mental Status -  ?Level of arousal and orientation to time, place, and person were intact. ?Language including expression, naming, repetition, comprehension was assessed and found intact, only with mild dysarthria ? ?Cranial Nerves II - XII - ?II - Visual field intact OU. ?III, IV, VI - Extraocular movements intact, but patient with R gaze preference ?V -  Facial sensation intact bilaterally. ?VII - Facial movement with L facial droop ?VIII - Hearing & vestibular intact bilaterally. ?X - Palate elevates symmetrically. ?XII - Tongue protrusion with L deviation. ? ?Motor Strength - The patient?s strength was normal in RU and RL extremities and pronator drift was absent. 1/5 LLE and 0/5 flaccid LUE. Bulk was normal and fasciculations were absent.   ?Motor Tone - Muscle tone was assessed and increased in LLE, but otherwise normal in other extremities. ? ?Sensory - Light touch was assessed and asymmetrical in LE with some L hemineglect. When touching L leg alone, able to identify but when touching both legs, only says R. ? ?Coordination - The patient had normal movements in the R hand and RLE with no ataxia or dysmetria.  Tremor was absent. ? ?Gait and Station - deferred. ? ? ? ?ASSESSMENT/PLAN ?Mr.  Miguel Mccullough is a 56 y.o. male with history of HTN presenting with L sided weakness, numbness, and facial droop and dysarthria. SBP in 270s en route.  Resumed p.o. medications yesterday and today and have been able to titrate off the Cleviprex.  Plan to transfer out of ICU ? 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  ?CTA head & neck No vessel stenosis, occlusion, aneurysm, or dissection.  ?Follow-up CTH stable IPH. ?MRI  R basal ganglia hematoma with intraventricular extension; stable from follow-up CT. Background small vessel ischemic gliosis with chronic microhemorrhages. ?2D Echo LVEF 70-75%, severe LVH, Grade II diastolic dysfunction, LA mild to mod dilated, trivial MVR, Tricuspid aortic valve with mild thickening.  ?LDL 94 ?HgbA1c 4.7% ?VTE prophylaxis - SCDs ?No antithrombotic prior to admission, now on No antithrombotic. Will not initiate therapy until patient 5-7 days post-IPH, pending hematoma remains stable. ?Therapy recommendations:  AIR; needs continued SLP follow-up ?Disposition:  Pending ? ?Hypertensive Emergency ?Hypertensive Encephalopathy ?Home meds:  See in HPI; Non-compliant ?Stable overnight with maximal Cleviprex and PRN Labetalol ?Initiated oral anti-HTNsive meds today: Amlodipine 10 mg, Metoprolol 50 mg BID, and Clonidine 0.1 mg BID.  ?Cleviprex remains in place for additional BP coverage. Wean as tolerated. ?Goal SBP 160 ?Long-term BP goal normotensive ? ?Hyperlipidemia ?Home meds:  N/A ?LDL 94., goal < 70 ?Initiating Atorvastatin 40 mg ?High intensity statin indicated  ?Continue statin at discharge ? ?Diabetes type II Assessment ?Home meds:  N/A ?HgbA1c 4.7, goal < 7.0 ?CBGs ? ?Other Stroke Risk Factors ?ETOH use, alcohol level <10, advised to drink no more than 1-2 drink(s) a day ?Substance abuse - UDS:  THC NONE DETECTED, Cocaine NONE DETECTED.  ?Obesity, Body mass index is  29.35 kg/m?., BMI >/= 30 associated with increased stroke risk, recommend weight loss, diet and exercise as appropriate  ? ?Other Active Problems ?Severe LVH: recommend to follow-up outpatient with cardiology to assess for HCM. Will submit referral.  ? ?Hospital day # 4 ? ?Patient seen and examined by NP/APP with MD. MD to update note as needed.  ? ?Janine Ores, DNP, FNP-BC ?[facility] ?Pager: [phone] ? ?ATTENDING ATTESTATION: ? ?Discharged to rehab today. Exam is unchanged from yesterday. Family's questions answered.  ? ?Dr. Reeves Forth evaluated pt independently, reviewed imaging, chart, labs. Discussed and formulated plan with the APP. Please see APP note above for details.   Total 36 minutes spent on counseling patient and coordinating care, writing notes and reviewing chart. ? ? ?Kalani Sthilaire,MD  ? ? ? ?To contact Stroke Continuity provider, please  refer to [email or web address] ?After hours, contact General Neurology  ?

## 2021-04-17 NOTE — H&P (Signed)
Physical Medicine and Rehabilitation Admission H&P        Chief Complaint  Patient presents with   IPH with functional deficits      HPI: Miguel Mccullough is a 56 year old R handed  male with history of HTN but no medications (had stopped meds and not followed up with PCP) who was admitted on 04/13/21 with sudden onset of slurred speech with left-sided numbness followed by weakness and BP noted to be elevated in 270s in route to the hospital.  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  CTA head/neck was negative for aneurysm, dissection, occlusion or significant stenosis.  He was started on Cleviprex for BP control with follow-up CT head showing no change in IPH.  2D echo done showing EF 70 to 75% with severe LVH and grade 2 diastolic dysfunction as well as mild to moderate LA dilatation.   He with question of CKD and had worsening of renal status with follow-up renal ultrasound done 03/02 showing minimal increase in echogenicity.  He also developed significant drop in potassium requiring multiple runs of K as well as oral supplementation.  He was weaned off Cleviprex yesterday and BP medications being titrated for SBP goal < 160.  He was noted to have cognitive deficits with poor safety awareness as well as mild oropharyngeal dysphagia with premature spillage and currently tolerating regular diet.  Patient with resultant left-sided weakness with numbness, left facial droop with slurred speech as well as mild dysphagia affecting overall functional status.  CIR was recommended due to functional decline.   Pt reports LBM this AM; peeing well; no pain; no nausea- tolerating regular diet per pt and wife.  Fell asleep after exam- very drowsy.     Review of Systems  Constitutional:  Negative for chills and fever.  HENT:  Negative for hearing loss and tinnitus.   Eyes:  Negative for blurred vision and double vision.  Respiratory:  Positive  for sputum production. Negative for cough and shortness of breath.   Cardiovascular:  Negative for chest pain and palpitations.  Gastrointestinal:  Negative for abdominal pain, constipation, heartburn and nausea.  Genitourinary:  Positive for frequency and urgency.  Musculoskeletal:  Negative for myalgias and neck pain.  Neurological:  Positive for sensory change, focal weakness and headaches. Negative for dizziness.  Psychiatric/Behavioral:  Negative for depression. The patient has insomnia.   All other systems reviewed and are negative.         Past Medical History:  Diagnosis Date   COVID-19 2021    continues to have increase in suptum production.   Hypertension        History reviewed. No pertinent surgical history.          Family History  Problem Relation Age of Onset   Hypertension Mother     Hypertension Father     Hypertension Sister     Hypertension Brother        Social History: Married, independent and active PTA. He is self-employed--cleans restaurants. .  He does not use tobacco, smokeless tobacco, vapes or alcohol. He denies drug use.     Allergies: No Known Allergies           Medications Prior to Admission  Medication Sig Dispense Refill   ibuprofen (ADVIL) 200 MG tablet Take 200 mg by mouth every 6 (six) hours as needed for moderate pain.       protein supplement Renee Pain VANILLA) POWD Take  2 oz by mouth daily.              Home: Home Living Family/patient expects to be discharged to:: Private residence Living Arrangements: Spouse/significant other Available Help at Discharge: Available 24 hours/day Type of Home: House Home Access: Stairs to enter [employer] of Steps: 5 Entrance Stairs-Rails: Right, Left Home Layout: Two level, Bed/bath upstairs, 1/2 bath on main level Alternate Level Stairs-Number of Steps: 15 Alternate Level Stairs-Rails: Right Bathroom Shower/Tub: Multimedia programmer: Standard Bathroom  Accessibility: Yes Home Equipment: None Additional Comments: Owns cleaning business  Lives With:  (spouse and daughter is 44 years old)   Functional History: Prior Function Prior Level of Function : Independent/Modified Independent   Functional Status:  Mobility: Bed Mobility Overal bed mobility: Needs Assistance Bed Mobility: Rolling, Supine to Sit, Sit to Supine Rolling: Min assist Supine to sit: Max assist Sit to supine: Max assist General bed mobility comments: verbal cues for rail use and assisting LLE off bed Transfers Overall transfer level: Needs assistance Equipment used: 1 person hand held assist Transfers: Sit to/from Stand, Bed to chair/wheelchair/BSC Sit to Stand: Mod assist Bed to/from chair/wheelchair/BSC transfer type:: Stand pivot Stand pivot transfers: Max assist  Lateral/Scoot Transfers: Max assist, +2 safety/equipment General transfer comment: Stand pivot transfer with LLE knee blocked Ambulation/Gait General Gait Details: unable Pre-gait activities: pt participates in weight shifting in standing, maxA with L knee buckle with shift to left   ADL: ADL Overall ADL's : Needs assistance/impaired Eating/Feeding: Minimal assistance, Sitting Grooming: Wash/dry hands, Wash/dry face, Minimal assistance, Sitting Grooming Details (indicate cue type and reason): perfromed seated in recliner with cues to attend left side Upper Body Bathing: Moderate assistance, Sitting Upper Body Bathing Details (indicate cue type and reason): supported sitting.  No functional use of LLE. Lower Body Bathing: Maximal assistance, Sit to/from stand, Cueing for compensatory techniques, Cueing for safety Lower Body Bathing Details (indicate cue type and reason): Pt with heavy L lean and has difficulty finding midline Upper Body Dressing : Maximal assistance, Sitting Lower Body Dressing: Maximal assistance, Sit to/from stand, Cueing for safety, Cueing for compensatory techniques Lower Body  Dressing Details (indicate cue type and reason): assist sit to stand and assist to tend to L side of body when dressing Toilet Transfer: Maximal assistance, BSC/3in1, Squat-pivot Toilet Transfer Details (indicate cue type and reason): transfer to strong side. Toileting- Clothing Manipulation and Hygiene: Total assistance, Sit to/from stand, Cueing for compensatory techniques, Cueing for safety Functional mobility during ADLs: Maximal assistance, +2 for physical assistance General ADL Comments: addressed locating ADL items on left with 3/3   Cognition: Cognition Overall Cognitive Status: Impaired/Different from baseline Arousal/Alertness: Awake/alert Orientation Level: Oriented X4 Year: 2023 (with self-correction, "19.Marland KitchenMarland KitchenI mean 2003.Marland KitchenMarland KitchenI mean 2023") Month: February Day of Week: Incorrect (Tuesday, February 24th) Attention: Sustained, Focused Focused Attention: Impaired Focused Attention Impairment: Verbal complex Sustained Attention: Impaired Sustained Attention Impairment: Verbal complex Memory: Impaired Memory Impairment: Retrieval deficit, Decreased recall of new information (Immediate: 5/5 with repetition x3; delayed: 3/5; with cues: 2/2) Awareness: Impaired Awareness Impairment: Emergent impairment Problem Solving: Impaired Problem Solving Impairment: Verbal complex (Money: 0/3; Time: 1/1 with cues) Executive Function: Sequencing, Technical brewer: Impaired Sequencing Impairment: Verbal complex (Clock: 2/2) Organizing: Appears intact (backward digit span: 3/3 with additional processing time.) Cognition Arousal/Alertness: Awake/alert Behavior During Therapy: WFL for tasks assessed/performed Overall Cognitive Status: Impaired/Different from baseline Area of Impairment: Awareness Current Attention Level: Focused Following Commands: Follows one step commands consistently, Follows multi-step commands inconsistently Safety/Judgement: Decreased  awareness of safety, Decreased  awareness of deficits Awareness: Emergent Problem Solving: Slow processing, Decreased initiation General Comments: L inattention, required cues to position LUE     Blood pressure (!) 156/98, pulse 70, temperature 99 F (37.2 C), temperature source Oral, resp. rate 17, height 5\' 4"  (1.626 m), weight 77.6 kg, SpO2 99 %. Physical Exam Vitals and nursing note reviewed. Exam conducted with a chaperone present.  Constitutional:      Appearance: Normal appearance.     Comments: Sleepy, but amenable to exam; laying supine in bed; wife and mother in law at bedside, NAD  HENT:     Head: Normocephalic and atraumatic.     Comments: L facial droop- tongue to L side L facial sensory deficits to light touch    Right Ear: External ear normal.     Left Ear: External ear normal.     Nose: Nose normal. No congestion.     Mouth/Throat:     Mouth: Mucous membranes are dry.     Pharynx: Oropharynx is clear. No oropharyngeal exudate.  Eyes:     General:        Right eye: No discharge.        Left eye: No discharge.     Conjunctiva/sclera: Conjunctivae normal.     Comments: R gaze preference, however can cross midline to look left with coaxing.   Cardiovascular:     Rate and Rhythm: Normal rate and regular rhythm.     Heart sounds: Normal heart sounds. No murmur heard.   No gallop.  Pulmonary:     Effort: Pulmonary effort is normal. No respiratory distress.     Breath sounds: Normal breath sounds. No wheezing, rhonchi or rales.  Abdominal:     General: Abdomen is flat. Bowel sounds are normal. There is no distension.     Palpations: Abdomen is soft.     Tenderness: There is no abdominal tenderness.  Musculoskeletal:     Cervical back: Neck supple. No tenderness.     Comments: RUE and RLE 5/5 except R HF 5-/5 LUE 0/5 in deltoid, biceps, triceps, WE, grip and FA LLE- 0/5 except L PF 1/5   Skin:    General: Skin is warm and dry.     Comments: B/L forearm IV"s not being used- look OK   Neurological:     Mental Status: He is alert and oriented to person, place, and time.     Comments: Mild left facial weakness with left inattention. Speech clear and able to follow commands without difficulty. Dense left hemiplegia with sensory deficits. Moderate to severe L neglect noted Absent sensation to light touch in LUE/LLE and L face  Sleepy- fell asleep after exam  Psychiatric:     Comments: Flat affect      Lab Results Last 48 Hours        Results for orders placed or performed during the hospital encounter of 04/13/21 (from the past 48 hour(s))  CBC     Status: Abnormal    Collection Time: 04/16/21  7:50 AM  Result Value Ref Range    WBC 12.0 (H) 4.0 - 10.5 K/uL    RBC 4.13 (L) 4.22 - 5.81 MIL/uL    Hemoglobin 12.2 (L) 13.0 - 17.0 g/dL    HCT 35.2 (L) 39.0 - 52.0 %    MCV 85.2 80.0 - 100.0 fL    MCH 29.5 26.0 - 34.0 pg    MCHC 34.7 30.0 - 36.0 g/dL    RDW  13.4 11.5 - 15.5 %    Platelets 186 150 - 400 K/uL    nRBC 0.0 0.0 - 0.2 %      Comment: Performed at [facility], [place] [address] [place] [address] [place], [place] [address]  Triglycerides     Status: None    Collection Time: 04/16/21  7:50 AM  Result Value Ref Range    Triglycerides 78 <150 mg/dL      Comment: Performed at [facility] [address] [place], [place] [address]  Basic metabolic panel     Status: Abnormal    Collection Time: 04/16/21  6:34 PM  Result Value Ref Range    Sodium 136 135 - 145 mmol/L    Potassium 3.2 (L) 3.5 - 5.1 mmol/L    Chloride 100 98 - 111 mmol/L    CO2 25 22 - 32 mmol/L    Glucose, Bld 137 (H) 70 - 99 mg/dL      Comment: Glucose reference range applies only to samples taken after fasting for at least 8 hours.    BUN 28 (H) 6 - 20 mg/dL    Creatinine, Ser 2.57 (H) 0.61 - 1.24 mg/dL    Calcium 8.1 (L) 8.9 - 10.3 mg/dL    GFR, Estimated 29 (L) >60 mL/min      Comment: (NOTE) Calculated using the CKD-EPI Creatinine Equation (2021)      Anion gap 11 5 - 15      Comment:  Performed at [facility] [address], [place], [place] [address]  CBC     Status: Abnormal    Collection Time: 04/16/21  6:34 PM  Result Value Ref Range    WBC 11.3 (H) 4.0 - 10.5 K/uL    RBC 4.39 4.22 - 5.81 MIL/uL    Hemoglobin 13.0 13.0 - 17.0 g/dL    HCT 38.0 (L) 39.0 - 52.0 %    MCV 86.6 80.0 - 100.0 fL    MCH 29.6 26.0 - 34.0 pg    MCHC 34.2 30.0 - 36.0 g/dL    RDW 13.6 11.5 - 15.5 %    Platelets 182 150 - 400 K/uL    nRBC 0.0 0.0 - 0.2 %      Comment: Performed at [facility], [place] [address], [place], [place] [address]  CBC     Status: Abnormal    Collection Time: 04/17/21  2:09 AM  Result Value Ref Range    WBC 10.5 4.0 - 10.5 K/uL    RBC 4.22 4.22 - 5.81 MIL/uL    Hemoglobin 12.6 (L) 13.0 - 17.0 g/dL    HCT 36.5 (L) 39.0 - 52.0 %    MCV 86.5 80.0 - 100.0 fL    MCH 29.9 26.0 - 34.0 pg    MCHC 34.5 30.0 - 36.0 g/dL    RDW 13.6 11.5 - 15.5 %    Platelets 183 150 - 400 K/uL    nRBC 0.0 0.0 - 0.2 %      Comment: Performed at [facility], [place] [address] [place], [place] [address]  Basic metabolic panel     Status: Abnormal    Collection Time: 04/17/21  2:09 AM  Result Value Ref Range    Sodium 138 135 - 145 mmol/L    Potassium 3.2 (L) 3.5 - 5.1 mmol/L    Chloride 99 98 - 111 mmol/L    CO2 27 22 - 32 mmol/L    Glucose, Bld  95 70 - 99 mg/dL      Comment: Glucose reference range applies only to samples taken after fasting for at least 8 hours.    BUN 28 (H) 6 - 20 mg/dL    Creatinine, Ser 2.41 (H) 0.61 - 1.24 mg/dL    Calcium 8.3 (L) 8.9 - 10.3 mg/dL    GFR, Estimated 31 (L) >60 mL/min      Comment: (NOTE) Calculated using the CKD-EPI Creatinine Equation (2021)      Anion gap 12 5 - 15      Comment: Performed at [facility] [address], [place], [place] [address]  Protein / creatinine ratio, urine     Status: Abnormal    Collection Time: 04/17/21  9:31 AM  Result Value Ref Range    Creatinine, Urine 130.57 mg/dL    Total Protein,  Urine 24 mg/dL      Comment: NO NORMAL RANGE ESTABLISHED FOR THIS TEST    Protein Creatinine Ratio 0.18 (H) 0.00 - 0.15 mg/mg[Cre]      Comment: Performed at [facility] [address] [place], [place] [address]       Imaging Results (Last 48 hours)  US RENAL   Result Date: 04/17/2021 CLINICAL DATA:  AK I EXAM: RENAL / URINARY TRACT ULTRASOUND COMPLETE COMPARISON:  None. FINDINGS: Right Kidney: Renal measurements: 8.5 x 4.9 x 4.7 cm = volume: 103 mL. Echogenicity appears minimally increased, possibly due to technical related factors. No mass or hydronephrosis visualized. Left Kidney: Renal measurements: 9.1 x 5.3 x 5.2 cm = volume: 132 mL. Echogenicity appears minimally increased, possibly due to technical related factors. No mass or hydronephrosis visualized. Bladder: Appears normal for degree of bladder distention. Other: None. IMPRESSION: No hydronephrosis. Electronically Signed   By: Valentino Saxon M.D.   On: 04/17/2021 10:20           Blood pressure (!) 156/98, pulse 70, temperature 99 F (37.2 C), temperature source Oral, resp. rate 17, height 5\' 4"  (1.626 m), weight 77.6 kg, SpO2 99 %.   Medical Problem List and Plan: 1. 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  Antithrombotics: -DVT/anticoagulation:  Pharmaceutical: Lovenox--5 days out from bleed. Will start lovenox for DVT prophylaxis. Check dopplers due to HP and high risk for DVT             -antiplatelet therapy: N/A due to bleed.  3. Pain Management:: On as needed 4. Mood: LCSW to follow for evaluation an dsup             -antipsychotic agents: N/A 5. Neuropsych: This patient is capable of making decisions on his own behalf. 6. Skin/Wound Care: Routine pressure relief measures.  7. Fluids/Electrolytes/Nutrition: Monitor I/O. Check CMET in am.  8. HTN: Continues to be poorly  controlled requiring prn meds to keep SBP< 160             --continue Catapress 2 mg TID, metoprolol 50 mg BID and Norvasc 10 md/d. 9. Persistent hypokalemia: Start Kdur 20 meq daily. Check Mg level 10.  Severe LVH: Has been referred to cardiology for work up. Continue metoprolol bid.  11. 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  --  Renal ultrasound with mild evidence of medical renal disease             --encourage fluid intake.  12, Elevated AST: recheck in am. 13. Hyperlipidemia: Continue Lipitor.  14. ABLA: Likely due to bleed with drop from 14.4-->12.6.              --monitor for signs of bleeding. Recheck CBC in am.  15. L neglect and lethargy-will wait on Amantadine or Ritalin at this time.  16. Noncompliance- hadn't taken BP med sin months per pt.      I have personally performed a face to face diagnostic evaluation of this patient and formulated the key components of the plan.  Additionally, I have personally reviewed laboratory data, imaging studies, as well as relevant notes and concur with the physician assistant's documentation above.   The patient's status has not changed from the original H&P.  Any changes in documentation from the acute care chart have been noted above.       Bary Leriche, PA-C 04/17/2021

## 2021-04-17 NOTE — Progress Notes (Signed)
PRN tylenol given and scheduled BP meds. Going to CIR will let the nurse know to recheck. ? ? ? 04/17/21 1612  ?Vitals  ?Temp 100.2 ?F (37.9 ?C) ?(RN Notified)  ?Temp Source Oral  ?BP (!) 164/101 ?(RN Notified)  ?MAP (mmHg) 119  ?BP Location Right Arm  ?BP Method Automatic  ?Patient Position (if appropriate) Lying  ?Pulse Rate 79  ?Pulse Rate Source Dinamap  ?ECG Heart Rate 79  ?MEWS COLOR  ?MEWS Score Color Green  ?Oxygen Therapy  ?SpO2 100 %  ?MEWS Score  ?MEWS Temp 0  ?MEWS Systolic 0  ?MEWS Pulse 0  ?MEWS RR 0  ?MEWS LOC 0  ?MEWS Score 0  ? ? ?

## 2021-04-17 NOTE — Progress Notes (Signed)
Scheduled BP meds given will recheck BP. ? ? ? 04/17/21 0935  ?Vitals  ?Temp 99 ?F (37.2 ?C)  ?Temp Source Oral  ?BP (!) 161/101 ?(RN Notified)  ?MAP (mmHg) 118  ?BP Location Right Arm  ?BP Method Automatic  ?Patient Position (if appropriate) Lying  ?Pulse Rate 72  ?Pulse Rate Source Dinamap  ?ECG Heart Rate 73  ?Resp 17  ?MEWS COLOR  ?MEWS Score Color Green  ?Oxygen Therapy  ?SpO2 99 %  ?MEWS Score  ?MEWS Temp 0  ?MEWS Systolic 0  ?MEWS Pulse 0  ?MEWS RR 0  ?MEWS LOC 0  ?MEWS Score 0  ? ? ?

## 2021-04-17 NOTE — Progress Notes (Signed)
Wife stated that he did strain during [facility] that he just had. ?

## 2021-04-17 NOTE — Progress Notes (Signed)
Received from [place] ICU in bed accompanied by nurse oriented to room and surroundings. ?

## 2021-04-17 NOTE — H&P (Signed)
Physical Medicine and Rehabilitation Admission H&P    Chief Complaint  Patient presents with   IPH with functional deficits    HPI: Miguel Mccullough is a 56 year old R handed  male with history of HTN but no medications (had stopped meds and not followed up with PCP) who was admitted on 04/13/21 with sudden onset of slurred speech with left-sided numbness followed by weakness and BP noted to be elevated in 270s in route to the hospital.  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  CTA head/neck was negative for aneurysm, dissection, occlusion or significant stenosis.  He was started on Cleviprex for BP control with follow-up CT head showing no change in IPH.  2D echo done showing EF 70 to 75% with severe LVH and grade 2 diastolic dysfunction as well as mild to moderate LA dilatation.  He with question of CKD and had worsening of renal status with follow-up renal ultrasound done 03/02 showing minimal increase in echogenicity.  He also developed significant drop in potassium requiring multiple runs of K as well as oral supplementation.  He was weaned off Cleviprex yesterday and BP medications being titrated for SBP goal < 160.  He was noted to have cognitive deficits with poor safety awareness as well as mild oropharyngeal dysphagia with premature spillage and currently tolerating regular diet.  Patient with resultant left-sided weakness with numbness, left facial droop with slurred speech as well as mild dysphagia affecting overall functional status.  CIR was recommended due to functional decline.  Pt reports LBM this AM; peeing well; no pain; no nausea- tolerating regular diet per pt and wife.  Fell asleep after exam- very drowsy.    Review of Systems  Constitutional:  Negative for chills and fever.  HENT:  Negative for hearing loss and tinnitus.   Eyes:  Negative for blurred vision and double vision.  Respiratory:  Positive for sputum  production. Negative for cough and shortness of breath.   Cardiovascular:  Negative for chest pain and palpitations.  Gastrointestinal:  Negative for abdominal pain, constipation, heartburn and nausea.  Genitourinary:  Positive for frequency and urgency.  Musculoskeletal:  Negative for myalgias and neck pain.  Neurological:  Positive for sensory change, focal weakness and headaches. Negative for dizziness.  Psychiatric/Behavioral:  Negative for depression. The patient has insomnia.   All other systems reviewed and are negative.   Past Medical History:  Diagnosis Date   COVID-19 2021   continues to have increase in suptum production.   Hypertension     History reviewed. No pertinent surgical history.   Family History  Problem Relation Age of Onset   Hypertension Mother    Hypertension Father    Hypertension Sister    Hypertension Brother     Social History: Married, independent and active PTA. He is self-employed--cleans restaurants. .  He does not use tobacco, smokeless tobacco, vapes or alcohol. He denies drug use.   Allergies: No Known Allergies   Medications Prior to Admission  Medication Sig Dispense Refill   ibuprofen (ADVIL) 200 MG tablet Take 200 mg by mouth every 6 (six) hours as needed for moderate pain.     protein supplement (UNJURY VANILLA) POWD Take 2 oz by mouth daily.        Home: Home Living Family/patient expects to be discharged to:: Private residence Living Arrangements: Spouse/significant other Available Help at Discharge: Available 24 hours/day Type of Home: House Home Access: Stairs to enter Entrance  Stairs-Number of Steps: 5 Entrance Stairs-Rails: Right, Left Home Layout: Two level, Bed/bath upstairs, 1/2 bath on main level Alternate Level Stairs-Number of Steps: 15 Alternate Level Stairs-Rails: Right Bathroom Shower/Tub: Multimedia programmer: Standard Bathroom Accessibility: Yes Home Equipment: None Additional Comments: Owns  cleaning business  Lives With:  (spouse and daughter is 75 years old)   Functional History: Prior Function Prior Level of Function : Independent/Modified Independent  Functional Status:  Mobility: Bed Mobility Overal bed mobility: Needs Assistance Bed Mobility: Rolling, Supine to Sit, Sit to Supine Rolling: Min assist Supine to sit: Max assist Sit to supine: Max assist General bed mobility comments: verbal cues for rail use and assisting LLE off bed Transfers Overall transfer level: Needs assistance Equipment used: 1 person hand held assist Transfers: Sit to/from Stand, Bed to chair/wheelchair/BSC Sit to Stand: Mod assist Bed to/from chair/wheelchair/BSC transfer type:: Stand pivot Stand pivot transfers: Max assist  Lateral/Scoot Transfers: Max assist, +2 safety/equipment General transfer comment: Stand pivot transfer with LLE knee blocked Ambulation/Gait General Gait Details: unable Pre-gait activities: pt participates in weight shifting in standing, maxA with L knee buckle with shift to left    ADL: ADL Overall ADL's : Needs assistance/impaired Eating/Feeding: Minimal assistance, Sitting Grooming: Wash/dry hands, Wash/dry face, Minimal assistance, Sitting Grooming Details (indicate cue type and reason): perfromed seated in recliner with cues to attend left side Upper Body Bathing: Moderate assistance, Sitting Upper Body Bathing Details (indicate cue type and reason): supported sitting.  No functional use of LLE. Lower Body Bathing: Maximal assistance, Sit to/from stand, Cueing for compensatory techniques, Cueing for safety Lower Body Bathing Details (indicate cue type and reason): Pt with heavy L lean and has difficulty finding midline Upper Body Dressing : Maximal assistance, Sitting Lower Body Dressing: Maximal assistance, Sit to/from stand, Cueing for safety, Cueing for compensatory techniques Lower Body Dressing Details (indicate cue type and reason): assist sit to  stand and assist to tend to L side of body when dressing Toilet Transfer: Maximal assistance, BSC/3in1, Squat-pivot Toilet Transfer Details (indicate cue type and reason): transfer to strong side. Toileting- Clothing Manipulation and Hygiene: Total assistance, Sit to/from stand, Cueing for compensatory techniques, Cueing for safety Functional mobility during ADLs: Maximal assistance, +2 for physical assistance General ADL Comments: addressed locating ADL items on left with 3/3  Cognition: Cognition Overall Cognitive Status: Impaired/Different from baseline Arousal/Alertness: Awake/alert Orientation Level: Oriented X4 Year: 2023 (with self-correction, "19.Marland KitchenMarland KitchenI mean 2003.Marland KitchenMarland KitchenI mean 2023") Month: February Day of Week: Incorrect (Tuesday, February 24th) Attention: Sustained, Focused Focused Attention: Impaired Focused Attention Impairment: Verbal complex Sustained Attention: Impaired Sustained Attention Impairment: Verbal complex Memory: Impaired Memory Impairment: Retrieval deficit, Decreased recall of new information (Immediate: 5/5 with repetition x3; delayed: 3/5; with cues: 2/2) Awareness: Impaired Awareness Impairment: Emergent impairment Problem Solving: Impaired Problem Solving Impairment: Verbal complex (Money: 0/3; Time: 1/1 with cues) Executive Function: Sequencing, Technical brewer: Impaired Sequencing Impairment: Verbal complex (Clock: 2/2) Organizing: Appears intact (backward digit span: 3/3 with additional processing time.) Cognition Arousal/Alertness: Awake/alert Behavior During Therapy: WFL for tasks assessed/performed Overall Cognitive Status: Impaired/Different from baseline Area of Impairment: Awareness Current Attention Level: Focused Following Commands: Follows one step commands consistently, Follows multi-step commands inconsistently Safety/Judgement: Decreased awareness of safety, Decreased awareness of deficits Awareness: Emergent Problem Solving: Slow  processing, Decreased initiation General Comments: L inattention, required cues to position LUE   Blood pressure (!) 156/98, pulse 70, temperature 99 F (37.2 C), temperature source Oral, resp. rate 17, height 5\' 4"  (1.626 m), weight 77.6  kg, SpO2 99 %. Physical Exam Vitals and nursing note reviewed. Exam conducted with a chaperone present.  Constitutional:      Appearance: Normal appearance.     Comments: Sleepy, but amenable to exam; laying supine in bed; wife and mother in law at bedside, NAD  HENT:     Head: Normocephalic and atraumatic.     Comments: L facial droop- tongue to L side L facial sensory deficits to light touch    Right Ear: External ear normal.     Left Ear: External ear normal.     Nose: Nose normal. No congestion.     Mouth/Throat:     Mouth: Mucous membranes are dry.     Pharynx: Oropharynx is clear. No oropharyngeal exudate.  Eyes:     General:        Right eye: No discharge.        Left eye: No discharge.     Conjunctiva/sclera: Conjunctivae normal.     Comments: R gaze preference, however can cross midline to look left with coaxing.   Cardiovascular:     Rate and Rhythm: Normal rate and regular rhythm.     Heart sounds: Normal heart sounds. No murmur heard.   No gallop.  Pulmonary:     Effort: Pulmonary effort is normal. No respiratory distress.     Breath sounds: Normal breath sounds. No wheezing, rhonchi or rales.  Abdominal:     General: Abdomen is flat. Bowel sounds are normal. There is no distension.     Palpations: Abdomen is soft.     Tenderness: There is no abdominal tenderness.  Musculoskeletal:     Cervical back: Neck supple. No tenderness.     Comments: RUE and RLE 5/5 except R HF 5-/5 LUE 0/5 in deltoid, biceps, triceps, WE, grip and FA LLE- 0/5 except L PF 1/5   Skin:    General: Skin is warm and dry.     Comments: B/L forearm IV"s not being used- look OK  Neurological:     Mental Status: He is alert and oriented to person, place,  and time.     Comments: Mild left facial weakness with left inattention. Speech clear and able to follow commands without difficulty. Dense left hemiplegia with sensory deficits. Moderate to severe L neglect noted Absent sensation to light touch in LUE/LLE and L face  Sleepy- fell asleep after exam  Psychiatric:     Comments: Flat affect    Results for orders placed or performed during the hospital encounter of 04/13/21 (from the past 48 hour(s))  CBC     Status: Abnormal   Collection Time: 04/16/21  7:50 AM  Result Value Ref Range   WBC 12.0 (H) 4.0 - 10.5 K/uL   RBC 4.13 (L) 4.22 - 5.81 MIL/uL   Hemoglobin 12.2 (L) 13.0 - 17.0 g/dL   HCT 35.2 (L) 39.0 - 52.0 %   MCV 85.2 80.0 - 100.0 fL   MCH 29.5 26.0 - 34.0 pg   MCHC 34.7 30.0 - 36.0 g/dL   RDW 13.4 11.5 - 15.5 %   Platelets 186 150 - 400 K/uL   nRBC 0.0 0.0 - 0.2 %    Comment: Performed at [facility], [place] [address] [place], [place] [address]  Triglycerides     Status: None   Collection Time: 04/16/21  7:50 AM  Result Value Ref Range   Triglycerides 78 <150 mg/dL    Comment: Performed at [facility], [address] [place], [place] [address]  Basic metabolic panel     Status: Abnormal   Collection Time: 04/16/21  6:34 PM  Result Value Ref Range   Sodium 136 135 - 145 mmol/L   Potassium 3.2 (L) 3.5 - 5.1 mmol/L   Chloride 100 98 - 111 mmol/L   CO2 25 22 - 32 mmol/L   Glucose, Bld 137 (H) 70 - 99 mg/dL    Comment: Glucose reference range applies only to samples taken after fasting for at least 8 hours.   BUN 28 (H) 6 - 20 mg/dL   Creatinine, Ser 2.57 (H) 0.61 - 1.24 mg/dL   Calcium 8.1 (L) 8.9 - 10.3 mg/dL   GFR, Estimated 29 (L) >60 mL/min    Comment: (NOTE) Calculated using the CKD-EPI Creatinine Equation (2021)    Anion gap 11 5 - 15    Comment: Performed at [facility] [address] [place], [place] [address]  CBC     Status: Abnormal   Collection Time: 04/16/21  6:34 PM  Result Value  Ref Range   WBC 11.3 (H) 4.0 - 10.5 K/uL   RBC 4.39 4.22 - 5.81 MIL/uL   Hemoglobin 13.0 13.0 - 17.0 g/dL   HCT 38.0 (L) 39.0 - 52.0 %   MCV 86.6 80.0 - 100.0 fL   MCH 29.6 26.0 - 34.0 pg   MCHC 34.2 30.0 - 36.0 g/dL   RDW 13.6 11.5 - 15.5 %   Platelets 182 150 - 400 K/uL   nRBC 0.0 0.0 - 0.2 %    Comment: Performed at [facility], [place] [address], [place], [place] [address]  CBC     Status: Abnormal   Collection Time: 04/17/21  2:09 AM  Result Value Ref Range   WBC 10.5 4.0 - 10.5 K/uL   RBC 4.22 4.22 - 5.81 MIL/uL   Hemoglobin 12.6 (L) 13.0 - 17.0 g/dL   HCT 36.5 (L) 39.0 - 52.0 %   MCV 86.5 80.0 - 100.0 fL   MCH 29.9 26.0 - 34.0 pg   MCHC 34.5 30.0 - 36.0 g/dL   RDW 13.6 11.5 - 15.5 %   Platelets 183 150 - 400 K/uL   nRBC 0.0 0.0 - 0.2 %    Comment: Performed at [facility], [place] [address], [place], [place] [address]  Basic metabolic panel     Status: Abnormal   Collection Time: 04/17/21  2:09 AM  Result Value Ref Range   Sodium 138 135 - 145 mmol/L   Potassium 3.2 (L) 3.5 - 5.1 mmol/L   Chloride 99 98 - 111 mmol/L   CO2 27 22 - 32 mmol/L   Glucose, Bld 95 70 - 99 mg/dL    Comment: Glucose reference range applies only to samples taken after fasting for at least 8 hours.   BUN 28 (H) 6 - 20 mg/dL   Creatinine, Ser 2.41 (H) 0.61 - 1.24 mg/dL   Calcium 8.3 (L) 8.9 - 10.3 mg/dL   GFR, Estimated 31 (L) >60 mL/min    Comment: (NOTE) Calculated using the CKD-EPI Creatinine Equation (2021)    Anion gap 12 5 - 15    Comment: Performed at [facility] [address], [place],  [address]  Protein / creatinine ratio, urine     Status: Abnormal   Collection Time: 04/17/21  9:31 AM  Result Value Ref Range   Creatinine, Urine 130.57 mg/dL   Total Protein, Urine  24 mg/dL    Comment: NO NORMAL RANGE ESTABLISHED FOR THIS TEST   Protein Creatinine Ratio 0.18 (H) 0.00 - 0.15 mg/mg[Cre]    Comment: Performed at [facility], [place] [address]  [place], [place] [address]   US RENAL  Result Date: 04/17/2021 CLINICAL DATA:  AK I EXAM: RENAL / URINARY TRACT ULTRASOUND COMPLETE COMPARISON:  None. FINDINGS: Right Kidney: Renal measurements: 8.5 x 4.9 x 4.7 cm = volume: 103 mL. Echogenicity appears minimally increased, possibly due to technical related factors. No mass or hydronephrosis visualized. Left Kidney: Renal measurements: 9.1 x 5.3 x 5.2 cm = volume: 132 mL. Echogenicity appears minimally increased, possibly due to technical related factors. No mass or hydronephrosis visualized. Bladder: Appears normal for degree of bladder distention. Other: None. IMPRESSION: No hydronephrosis. Electronically Signed   By: Valentino Saxon M.D.   On: 04/17/2021 10:20      Blood pressure (!) 156/98, pulse 70, temperature 99 F (37.2 C), temperature source Oral, resp. rate 17, height 5\' 4"  (1.626 m), weight 77.6 kg, SpO2 99 %.  Medical Problem List and Plan: 1. 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  Antithrombotics: -DVT/anticoagulation:  Pharmaceutical: Lovenox--5 days out from bleed. Will start lovenox for DVT prophylaxis. Check dopplers due to HP and high risk for DVT  -antiplatelet therapy: N/A due to bleed.  3. Pain Management:: On as needed 4. Mood: LCSW to follow for evaluation an dsup  -antipsychotic agents: N/A 5. Neuropsych: This patient is capable of making decisions on his own behalf. 6. Skin/Wound Care: Routine pressure relief measures.  7. Fluids/Electrolytes/Nutrition: Monitor I/O. Check CMET in am.  8. HTN: Continues to be poorly controlled requiring prn meds to keep SBP< 160  --continue Catapress 2 mg TID, metoprolol 50 mg BID and Norvasc 10 md/d. 9. Persistent hypokalemia: Start Kdur 20 meq daily. Check Mg level 10.  Severe LVH: Has been referred to cardiology for work up. Continue metoprolol bid.  11. CKD related  to untreated HTN?: SCr-2.05 at admission-->2.41 with rise in BUN to 28.  --Renal ultrasound with mild evidence of medical renal disease  --encourage fluid intake.  12, Elevated AST: recheck in am. 13. Hyperlipidemia: Continue Lipitor.  14. ABLA: Likely due to bleed with drop from 14.4-->12.6.   --monitor for signs of bleeding. Recheck CBC in am.  15. L neglect and lethargy-will wait on Amantadine or Ritalin at this time.  16. Noncompliance- hadn't taken BP med sin months per pt.    I have personally performed a face to face diagnostic evaluation of this patient and formulated the key components of the plan.  Additionally, I have personally reviewed laboratory data, imaging studies, as well as relevant notes and concur with the physician assistant's documentation above.   The patient's status has not changed from the original H&P.  Any changes in documentation from the acute care chart have been noted above.     Bary Leriche, PA-C 04/17/2021

## 2021-04-17 NOTE — Progress Notes (Signed)
Inpatient Rehabilitation Admission Medication Review by a Pharmacist ? ?A complete drug regimen review was completed for this patient to identify any potential clinically significant medication issues. ? ?High Risk Drug Classes Is patient taking? Indication by Medication  ?Antipsychotic Yes Compazine- N/V  ?Anticoagulant Yes Lovenox- VTE prophylaxis  ?Antibiotic No   ?Opioid No   ?Antiplatelet No   ?Hypoglycemics/insulin No   ?Vasoactive Medication Yes Norvasc, Clonidine, Hydralazine, lopressor- hypertension  ?Chemotherapy No   ?Other Yes Protonix- GERD ?Lipitor- HLD ?Trazodone- sleep ?Potassium chloride- K supplementation  ? ? ? ?Type of Medication Issue Identified Description of Issue Recommendation(s)  ?Drug Interaction(s) (clinically significant) ?    ?Duplicate Therapy ?    ?Allergy ?    ?No Medication Administration End Date ?    ?Incorrect Dose ?    ?Additional Drug Therapy Needed ?    ?Significant med changes from prior encounter (inform family/care partners about these prior to discharge).    ?Other ?    ? ? ?Clinically significant medication issues were identified that warrant physician communication and completion of prescribed/recommended actions by midnight of the next day:  No ? ?Time spent performing this drug regimen review (minutes):  30 ? ? ?Smita Lesh BS, PharmD, BCPS ?Clinical Pharmacist ?04/18/2021 7:59 AM ? ? ? ? ?

## 2021-04-17 NOTE — Plan of Care (Signed)
?  Problem: Education: ?Goal: Knowledge of disease or condition will improve ?04/17/2021 0324 by Nancie Neas, RN ?Outcome: Progressing ?04/17/2021 0324 by Nancie Neas, RN ?Outcome: Progressing ?  ?Problem: Education: ?Goal: Knowledge of disease or condition will improve ?04/17/2021 0324 by Nancie Neas, RN ?Outcome: Progressing ?04/17/2021 0324 by Nancie Neas, RN ?Outcome: Progressing ?Goal: Knowledge of secondary prevention will improve (SELECT ALL) ?Outcome: Progressing ?Goal: Knowledge of patient specific risk factors will improve (INDIVIDUALIZE FOR PATIENT) ?Outcome: Progressing ?  ?Problem: Safety: ?Goal: Ability to remain free from injury will improve ?Outcome: Progressing ?  ?

## 2021-04-17 NOTE — TOC Transition Note (Signed)
Transition of Care (TOC) - CM/SW Discharge Note ? ? ?Patient Details  ?Name: Miguel Mccullough ?MRN: [id number] ?Date of Birth: Jan 26, 1966 ? ?Transition of Care (TOC) CM/SW Contact:  ?Pollie Friar, RN ?Phone Number: ?04/17/2021, 10:14 AM ? ? ?Clinical Narrative:    ?Patient is discharging to CIR today. CM signing off.  ? ? ?Final next level of care: [facility] ?Barriers to Discharge: Inadequate or no insurance, Barriers Unresolved (comment) ? ? ?Patient Goals and CMS Choice ?  ?  ?Choice offered to / list presented to : Patient ? ?Discharge Placement ?  ?           ?  ?  ?  ?  ? ?Discharge Plan and Services ?  ?Discharge Planning Services: CM Consult ?           ?  ?  ?  ?  ?  ?  ?  ?  ?  ?  ? ?Social Determinants of Health (SDOH) Interventions ?  ? ? ?Readmission Risk Interventions ?No flowsheet data found. ? ? ? ? ?

## 2021-04-17 NOTE — Progress Notes (Signed)
Inpatient Rehabilitation Admissions Coordinator  ? ?I met with patient and his wife at bedside . They are in agreement to CIR admit today and bed is available. Dr Maryland Pink has given clearance to admit today. Acute team and TOC made aware. I will make the arrangements to admit today. ? ?Danne Baxter, RN, MSN ?Rehab Admissions Coordinator ?[phone] ?04/17/2021 10:15 AM ? ?

## 2021-04-17 NOTE — Progress Notes (Signed)
Occupational Therapy Treatment ?Patient Details ?Name: Miguel Mccullough ?MRN: [id number] ?DOB: 30-Aug-1965 ?Today's Date: 04/17/2021 ? ? ?History of present illness Pt is 56 yo male who presents with L side weakness, L facial droop, and slurred speech. 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 PMH: HTN and covid ?  ?OT comments ? Patient on bedpan when therapist arrived.  Therapist assisted patient with rolling to assist with cleaning.  Education on bed mobility and getting LLE off side of bed and assistance with trunk. Patient performed transfer to recliner with stand pivot transfer and LLE knee blocked. Patient performed grooming seated in recliner and address left attention with locating ADL items on left with patient able to locate 3/3 items. Nursing returned and asked to assist patient back to bed due to radiology scheduled.  Patient was max assist to transfer and return to supine. Acute OT to continue to follow.   ? ?Recommendations for follow up therapy are one component of a multi-disciplinary discharge planning process, led by the attending physician.  Recommendations may be updated based on patient status, additional functional criteria and insurance authorization. ?   ?Follow Up Recommendations ? Acute inpatient rehab (3hours/day)  ?  ?Assistance Recommended at Discharge Frequent or constant Supervision/Assistance  ?Patient can return home with the following ? Two people to help with walking and/or transfers;A lot of help with bathing/dressing/bathroom;Assistance with cooking/housework;Direct supervision/assist for medications management;Direct supervision/assist for financial management;Assist for transportation ?  ?Equipment Recommendations ? Tub/shower bench;BSC/3in1;Other (comment)  ?  ?Recommendations for Other Services   ? ?  ?Precautions / Restrictions Precautions ?Precautions: Fall ?Precaution Comments: L hemiplegia ?Restrictions ?Weight Bearing Restrictions: No  ? ? ?  ? ?Mobility  Bed Mobility ?Overal bed mobility: Needs Assistance ?Bed Mobility: Rolling, Supine to Sit, Sit to Supine ?Rolling: Min assist ?  ?Supine to sit: Max assist ?Sit to supine: Max assist ?  ?General bed mobility comments: verbal cues for rail use and assisting LLE off bed ?  ? ?Transfers ?Overall transfer level: Needs assistance ?Equipment used: 1 person hand held assist ?Transfers: Sit to/from Stand, Bed to chair/wheelchair/BSC ?Sit to Stand: Mod assist ?Stand pivot transfers: Max assist ?  ?  ?  ?  ?General transfer comment: Stand pivot transfer with LLE knee blocked ?  ?  ?Balance Overall balance assessment: Needs assistance ?Sitting-balance support: Single extremity supported, Feet supported ?Sitting balance-Leahy Scale: Poor ?Sitting balance - Comments: requires RUE support to maintain sitting balance ?Postural control: Posterior lean, Left lateral lean ?Standing balance support: Bilateral upper extremity supported ?Standing balance-Leahy Scale: Poor ?Standing balance comment: max assist to maintain balance during transfer ?  ?  ?  ?  ?  ?  ?  ?  ?  ?  ?  ?   ? ?ADL either performed or assessed with clinical judgement  ? ?ADL Overall ADL's : Needs assistance/impaired ?  ?  ?Grooming: Wash/dry hands;Wash/dry face;Minimal assistance;Sitting ?Grooming Details (indicate cue type and reason): perfromed seated in recliner with cues to attend left side ?  ?  ?  ?  ?  ?  ?  ?  ?  ?  ?  ?  ?  ?  ?  ?General ADL Comments: addressed locating ADL items on left with 3/3 ?  ? ?Extremity/Trunk Assessment Upper Extremity Assessment ?Upper Extremity Assessment: LUE deficits/detail ?LUE Sensation: decreased light touch ?LUE Coordination: decreased fine motor;decreased gross motor ?  ?  ?  ?  ?  ? ?Vision   ?Eye  Alignment: Within Functional Limits ?Ocular Range of Motion: Within Functional Limits ?Alignment/Gaze Preference: Gaze right ?Tracking/Visual Pursuits: Able to track stimulus in all quads without difficulty ?Convergence:  Within functional limits ?Visual Fields: No apparent deficits ?Additional Comments: requires cues to attend to left ?  ?Perception   ?  ?Praxis   ?  ? ?Cognition Arousal/Alertness: Awake/alert ?Behavior During Therapy: [facility] for tasks assessed/performed ?Overall Cognitive Status: Impaired/Different from baseline ?Area of Impairment: Awareness ?  ?  ?  ?  ?  ?  ?  ?  ?  ?Current Attention Level: Focused ?  ?Following Commands: Follows one step commands consistently, Follows multi-step commands inconsistently ?Safety/Judgement: Decreased awareness of safety, Decreased awareness of deficits ?Awareness: Emergent ?Problem Solving: Slow processing, Decreased initiation ?General Comments: L inattention, required cues to position LUE ?  ?  ?   ?Exercises   ? ?  ?Shoulder Instructions   ? ? ?  ?General Comments    ? ? ?Pertinent Vitals/ Pain       Pain Assessment ?Pain Assessment: No/denies pain ? ?Home Living   ?  ?  ?  ?  ?  ?  ?  ?  ?  ?  ?  ?  ?  ?  ?  ?  ?  ?  ? ?  ?Prior Functioning/Environment    ?  ?  ?  ?   ? ?Frequency ? Min 2X/week  ? ? ? ? ?  ?Progress Toward Goals ? ?OT Goals(current goals can now be found in the care plan section) ? Progress towards OT goals: Progressing toward goals ? ?Acute Rehab OT Goals ?Patient Stated Goal: get better ?OT Goal Formulation: With patient/family ?Time For Goal Achievement: 04/29/21 ?Potential to Achieve Goals: Good ?ADL Goals ?Pt Will Perform Grooming: with set-up;sitting ?Pt Will Perform Upper Body Dressing: with min assist;sitting ?Pt Will Perform Lower Body Dressing: with min assist;sit to/from stand ?Pt Will Transfer to Toilet: with min assist;squat pivot transfer;bedside commode ?Pt Will Perform Tub/Shower Transfer: Shower transfer;with mod assist;shower seat;grab bars ?Additional ADL Goal #1: Pt will tend to adls items in the L environment during adls session with mininal cues.  ?Plan Discharge plan remains appropriate   ? ?Co-evaluation ? ? ?   ?  ?  ?  ?  ? ?  ?AM-PAC  OT "6 Clicks" Daily Activity     ?Outcome Measure ? ? Help from another person eating meals?: A Little ?Help from another person taking care of personal grooming?: A Lot ?Help from another person toileting, which includes using toliet, bedpan, or urinal?: A Lot ?Help from another person bathing (including washing, rinsing, drying)?: A Lot ?Help from another person to put on and taking off regular upper body clothing?: A Lot ?Help from another person to put on and taking off regular lower body clothing?: A Lot ?6 Click Score: 13 ? ?  ?End of Session Equipment Utilized During Treatment: Gait belt ? ?OT Visit Diagnosis: Other abnormalities of gait and mobility (R26.89);Unsteadiness on feet (R26.81);Hemiplegia and hemiparesis;Other symptoms and signs involving the nervous system (R29.898) ?Hemiplegia - Right/Left: Left ?Hemiplegia - dominant/non-dominant: Non-Dominant ?Hemiplegia - caused by: Other Nontraumatic intracranial hemorrhage ?  ?Activity Tolerance Patient tolerated treatment well ?  ?Patient Left in bed;with call bell/phone within reach;with family/visitor present ?  ?Nurse Communication Mobility status ?  ? ?   ? ?Time: 1829-9371 ?OT Time Calculation (min): 28 min ? ?Charges: OT General Charges ?$OT Visit: 1 Visit ?OT Treatments ?$Self Care/Home Management : 8-22  mins ?$Therapeutic Activity: 8-22 mins ? ?Lodema Hong, OTA ?Acute Rehabilitation Services  ?Pager [phone] ?Office [phone] ? ? ?Trixie Dredge ?04/17/2021, 10:22 AM ?

## 2021-04-17 NOTE — Progress Notes (Signed)
Courtney Heys, MD  Physician Physical Medicine and Rehabilitation PMR Pre-admission     Signed Date of Service:  04/16/2021  3:52 PM  Related encounter: ED to Hosp-Admission (Discharged) from 04/13/2021 in [facility]   Signed      Show:Clear all [x] Written[x] Templated[x] Copied  Added by: [x] Cristina Gong, RN[x] Lovorn, Jinny Blossom, MD  [] Hover for details                                                                                                                                                                                                                                                                                                                                                                                                                                        PMR Admission Coordinator Pre-Admission Assessment   Patient: Miguel Mccullough is an 56 y.o., male MRN: [id number] DOB: [birth year] Height: 5' 4"  (162.6 cm) Weight: 77.6 kg   Insurance Information HMO:     PPO:      PCP:      IPA:      80/20:      OTHER:  PRIMARY: uninsured        Estimate of cost of care provided and encouraged to apply for disability   Development worker, community:       Phone#:    [facility] for patients in Inpatient Rehabilitation Facilities with attached Privacy Act [place]  Care Records was provided and verbally reviewed with: N/A   Emergency Contact Information Contact Information       Name Relation Home Work Mobile    Yackley,Stephanie Spouse     [phone]         Current Medical History  Patient Admitting Diagnosis: CVA   History of Present Illness: HPI: Miguel Mccullough is a 56 year old male with history of HTN but no medications (had stopped meds and not followed up with PCP) who was admitted on  04/13/21 with sudden onset of slurred speech with left-sided numbness followed by weakness and BP noted to be elevated in 270s in route to the hospital.  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  CTA head/neck was negative for aneurysm, dissection, occlusion or significant stenosis.  He was started on Cleviprex for BP control with follow-up CT head showing no change in IPH.  2D echo done showing EF 70 to 75% with severe LVH and grade 2 diastolic dysfunction as well as mild to moderate LA dilatation.   He with question of CKD and had worsening of renal status with follow-up renal ultrasound done 03/02 showing minimal increase in echogenicity.  He also developed significant drop in potassium requiring multiple runs of K as well as oral supplementation.  He was weaned off Cleviprex yesterday and BP medications being titrated for SBP goal < 160.  He was noted to have cognitive deficits with poor safety awareness as well as mild oropharyngeal dysphagia with premature spillage and currently tolerating regular diet.  Patient with resultant left-sided weakness with numbness, left facial droop with slurred speech as well as mild dysphagia affecting overall functional status.      Complete NIHSS TOTAL: 11   Patient's medical record from [facility] has been reviewed by the rehabilitation admission coordinator and physician.   Past Medical History      Past Medical History:  Diagnosis Date   Hypertension      Has the patient had major surgery during 100 days prior to admission? No   Family History   family history is not on file.   Current Medications   Current Facility-Administered Medications:    acetaminophen (TYLENOL) tablet 650 mg, 650 mg, Oral, Q4H PRN, 650 mg at 04/17/21 0456 **OR** acetaminophen (TYLENOL) 160 MG/5ML solution 650 mg, 650 mg, Per Tube, Q4H PRN **OR** acetaminophen (TYLENOL) suppository 650 mg, 650 mg, Rectal, Q4H  PRN, Amie Portland, MD   amLODipine (NORVASC) tablet 10 mg, 10 mg, Oral, Daily, Cosby, Courtney, MD, 10 mg at 04/17/21 1027   atorvastatin (LIPITOR) tablet 40 mg, 40 mg, Oral, QHS, Cosby, Courtney, MD, 40 mg at 04/16/21 2135   Chlorhexidine Gluconate Cloth 2 % PADS 6 each, 6 each, Topical, Daily, Amie Portland, MD, 6 each at 04/16/21 0981   cloNIDine (CATAPRES) tablet 0.2 mg, 0.2 mg, Oral, TID, Charlean Merl, Devon, NP, 0.2 mg at 04/17/21 1027   hydrALAZINE (APRESOLINE) injection 10 mg, 10 mg, Intravenous, Q6H PRN, Charlean Merl, Devon, NP   labetalol (NORMODYNE) injection 10 mg, 10 mg, Intravenous, Q2H PRN, Charlean Merl, Devon, NP, 10 mg at 04/16/21 2140   metoprolol tartrate (LOPRESSOR) tablet 50 mg, 50 mg, Oral, BID, Cosby, Courtney, MD, 50 mg at 04/17/21 1027   ondansetron (ZOFRAN) injection 4 mg, 4 mg, Intravenous, Q6H PRN, Amie Portland, MD, 4 mg at 04/15/21 0108   pantoprazole (PROTONIX) EC tablet 40 mg, 40 mg, Oral, QHS, Dorene Grebe, MD, 40 mg at 04/16/21 2137  senna-docusate (Senokot-S) tablet 1 tablet, 1 tablet, Oral, BID, Amie Portland, MD, 1 tablet at 04/17/21 1027   Patients Current Diet:  Diet Order                  Diet renal with fluid restriction Fluid restriction: 1200 mL Fluid; Room service appropriate? Yes; Fluid consistency: Thin  Diet effective now                       Precautions / Restrictions Precautions Precautions: Fall Precaution Comments: L hemiplegia Restrictions Weight Bearing Restrictions: No    Has the patient had 2 or more falls or a fall with injury in the past year? No   Prior Activity Level Community (5-7x/wk): Independent; self employed, driving; cleans restaurants   Prior Functional Level Self Care: Did the patient need help bathing, dressing, using the toilet or eating? Independent   Indoor Mobility: Did the patient need assistance with walking from room to room (with or without device)? Independent   Stairs: Did the patient need assistance with  internal or external stairs (with or without device)? Independent   Functional Cognition: Did the patient need help planning regular tasks such as shopping or remembering to take medications? Independent   Patient Information Are you of Hispanic, Latino/a,or Spanish origin?: A. No, not of Hispanic, Latino/a, or Spanish origin What is your race?: B. Black or African American Do you need or want an interpreter to communicate with a doctor or health care staff?: 0. No   Patient's Response To:  Health Literacy and Transportation Is the patient able to respond to health literacy and transportation needs?: Yes Health Literacy - How often do you need to have someone help you when you read instructions, pamphlets, or other written material from your doctor or pharmacy?: Never In the past 12 months, has lack of transportation kept you from medical appointments or from getting medications?: No In the past 12 months, has lack of transportation kept you from meetings, work, or from getting things needed for daily living?: No   Development worker, international aid / [place] Devices/Equipment: Eyeglasses Home Equipment: None   Prior Device Use: Indicate devices/aids used by the patient prior to current illness, exacerbation or injury? None of the above   Current Functional Level Cognition   Arousal/Alertness: Awake/alert Overall Cognitive Status: Impaired/Different from baseline Current Attention Level: Focused Orientation Level: Oriented X4 Following Commands: Follows one step commands consistently, Follows multi-step commands inconsistently Safety/Judgement: Decreased awareness of safety, Decreased awareness of deficits General Comments: L inattention, required cues to position LUE Attention: Sustained, Focused Focused Attention: Impaired Focused Attention Impairment: Verbal complex Sustained Attention: Impaired Sustained Attention Impairment: Verbal complex Memory: Impaired Memory  Impairment: Retrieval deficit, Decreased recall of new information (Immediate: 5/5 with repetition x3; delayed: 3/5; with cues: 2/2) Awareness: Impaired Awareness Impairment: Emergent impairment Problem Solving: Impaired Problem Solving Impairment: Verbal complex (Money: 0/3; Time: 1/1 with cues) Executive Function: Sequencing, Technical brewer: Impaired Sequencing Impairment: Verbal complex (Clock: 2/2) Organizing: Appears intact (backward digit span: 3/3 with additional processing time.)    Extremity Assessment (includes Sensation/Coordination)   Upper Extremity Assessment: LUE deficits/detail LUE Sensation: decreased light touch LUE Coordination: decreased fine motor, decreased gross motor  Lower Extremity Assessment: LLE deficits/detail LLE Deficits / Details: no active motion noted LLE, pt able to detect deep pressure, not light touch LLE Sensation: decreased light touch, decreased proprioception LLE Coordination: decreased fine motor, decreased gross motor     ADLs  Overall ADL's : Needs assistance/impaired Eating/Feeding: Minimal assistance, Sitting Grooming: Wash/dry hands, Wash/dry face, Minimal assistance, Sitting Grooming Details (indicate cue type and reason): perfromed seated in recliner with cues to attend left side Upper Body Bathing: Moderate assistance, Sitting Upper Body Bathing Details (indicate cue type and reason): supported sitting.  No functional use of LLE. Lower Body Bathing: Maximal assistance, Sit to/from stand, Cueing for compensatory techniques, Cueing for safety Lower Body Bathing Details (indicate cue type and reason): Pt with heavy L lean and has difficulty finding midline Upper Body Dressing : Maximal assistance, Sitting Lower Body Dressing: Maximal assistance, Sit to/from stand, Cueing for safety, Cueing for compensatory techniques Lower Body Dressing Details (indicate cue type and reason): assist sit to stand and assist to tend to L side of body  when dressing Toilet Transfer: Maximal assistance, BSC/3in1, Squat-pivot Toilet Transfer Details (indicate cue type and reason): transfer to strong side. Toileting- Clothing Manipulation and Hygiene: Total assistance, Sit to/from stand, Cueing for compensatory techniques, Cueing for safety Functional mobility during ADLs: Maximal assistance, +2 for physical assistance General ADL Comments: addressed locating ADL items on left with 3/3     Mobility   Overal bed mobility: Needs Assistance Bed Mobility: Rolling, Supine to Sit, Sit to Supine Rolling: Min assist Supine to sit: Max assist Sit to supine: Max assist General bed mobility comments: verbal cues for rail use and assisting LLE off bed     Transfers   Overall transfer level: Needs assistance Equipment used: 1 person hand held assist Transfers: Sit to/from Stand, Bed to chair/wheelchair/BSC Sit to Stand: Mod assist Bed to/from chair/wheelchair/BSC transfer type:: Stand pivot Stand pivot transfers: Max assist  Lateral/Scoot Transfers: Max assist, +2 safety/equipment General transfer comment: Stand pivot transfer with LLE knee blocked     Ambulation / Gait / Stairs / Wheelchair Mobility   Ambulation/Gait General Gait Details: unable Pre-gait activities: pt participates in weight shifting in standing, maxA with L knee buckle with shift to left     Posture / Balance Dynamic Sitting Balance Sitting balance - Comments: requires RUE support to maintain sitting balance Balance Overall balance assessment: Needs assistance Sitting-balance support: Single extremity supported, Feet supported Sitting balance-Leahy Scale: Poor Sitting balance - Comments: requires RUE support to maintain sitting balance Postural control: Posterior lean, Left lateral lean Standing balance support: Bilateral upper extremity supported Standing balance-Leahy Scale: Poor Standing balance comment: max assist to maintain balance during transfer     Special  needs/care consideration Needs PCP arranged before discharge    Previous Home Environment  Living Arrangements: Spouse/significant other  Lives With:  (spouse and daughter is 22 years old) Available Help at Discharge: Available 24 hours/day Type of Home: House Home Layout: Two level, Bed/bath upstairs, 1/2 bath on main level Alternate Level Stairs-Rails: Right Alternate Level Stairs-Number of Steps: 15 Home Access: Stairs to enter Entrance Stairs-Rails: Right, Left Entrance Stairs-Number of Steps: 5 Bathroom Shower/Tub: Multimedia programmer: Standard Bathroom Accessibility: Yes How Accessible: Accessible via walker Home Care Services: No Additional Comments: Owns cleaning business   Discharge Living Setting Plans for Discharge Living Setting: Patient's home, Lives with (comment) (wife and 58 year old daughter) Type of Home at Discharge: House Discharge Home Layout: Two level, Bed/bath upstairs, 1/2 bath on main level Alternate Level Stairs-Rails: Right Alternate Level Stairs-Number of Steps: 15 Discharge Home Access: Stairs to enter Entrance Stairs-Rails: Right, Left Entrance Stairs-Number of Steps: 5 Discharge Bathroom Shower/Tub: Walk-in shower Discharge Bathroom Toilet: Standard Discharge Bathroom Accessibility: Yes How Accessible:  Accessible via walker Does the patient have any problems obtaining your medications?: No   Social/Family/Support Systems Patient Roles: Spouse, 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 Information: wife, stephanie Anticipated Caregiver: wife and daughter Anticipated Ambulance person Information: see contacts Ability/Limitations of Caregiver: wife can work from home Caregiver Availability: 24/7 Discharge Plan Discussed with Primary Caregiver: Yes Is Caregiver In Agreement with Plan?: Yes Does Caregiver/Family have Issues with Lodging/Transportation while Pt is in Rehab?: No   Goals Patient/Family Goal for Rehab: min assist  with PT and OT, supervision with SLP Expected length of stay: ELOS 14 to 20 days Pt/Family Agrees to Admission and willing to participate: Yes Program Orientation Provided & Reviewed with Pt/Caregiver Including Roles  & Responsibilities: Yes   Decrease burden of Care through IP rehab admission: n/a   Possible need for SNF placement upon discharge: not anticipated   Patient Condition: I have reviewed medical records from [facility], spoken with CM, and patient, spouse, and family member. I met with patient at the bedside for inpatient rehabilitation assessment.  Patient will benefit from ongoing PT, OT, and SLP, can actively participate in 3 hours of therapy a day 5 days of the week, and can make measurable gains during the admission.  Patient will also benefit from the coordinated team approach during an Inpatient Acute Rehabilitation admission.  The patient will receive intensive therapy as well as Rehabilitation physician, nursing, social worker, and care management interventions.  Due to bladder management, bowel management, safety, skin/wound care, disease management, medication administration, pain management, and patient education the patient requires 24 hour a day rehabilitation nursing.  The patient is currently mod assist overall with mobility and basic ADLs.  Discharge setting and therapy post discharge at home with home health is anticipated.  Patient has agreed to participate in the Acute Inpatient Rehabilitation Program and will admit today.   Preadmission Screen Completed By:  Cleatrice Burke, 04/17/2021 11:48 AM ______________________________________________________________________   Discussed status with Dr. Dagoberto Ligas on 04/17/2021 at 82 and received approval for admission today.   Admission Coordinator:  Cleatrice Burke, RN, time  1150 Date 04/17/2021    Assessment/Plan: Diagnosis: Does the need for close, 24 hr/day Medical supervision in concert with the  patient's rehab needs make it unreasonable for this patient to be served in a less intensive setting? Yes Co-Morbidities requiring supervision/potential complications: HTN unbcontrolled; R BG IPH; L hemiplegia; L inattention-  Due to bladder management, bowel management, safety, skin/wound care, disease management, medication administration, pain management, and patient education, does the patient require 24 hr/day rehab nursing? Yes Does the patient require coordinated care of a physician, rehab nurse, PT, OT, and SLP to address physical and functional deficits in the context of the above medical diagnosis(es)? Yes Addressing deficits in the following areas: balance, endurance, locomotion, strength, transferring, bowel/bladder control, bathing, dressing, feeding, grooming, toileting, cognition, speech, and swallowing Can the patient actively participate in an intensive therapy program of at least 3 hrs of therapy 5 days a week? Yes The potential for patient to make measurable gains while on inpatient rehab is good and fair Anticipated functional outcomes upon discharge from inpatient rehab: min assist PT, min assist OT, supervision SLP Estimated rehab length of stay to reach the above functional goals is: 14-20 days Anticipated discharge destination: Home 10. Overall Rehab/Functional Prognosis: good and fair     MD Signature:           Revision History  Note Details  Jan Fireman, MD File Time 04/17/2021 11:55 AM  Author Type Physician Status Signed  Last Editor Courtney Heys, MD Service Physical Medicine and Rehabilitation

## 2021-04-17 NOTE — Plan of Care (Signed)
PT had a BM. Primofit utilize per pt request. Ot is continent of bowel and bladder. Wife at bedside. OT assisted pt to the chair. Urine sample sent. No c/o pain. Decreased sensation to the left side. Plan for CIR. ?Problem: Education: ?Goal: Knowledge of disease or condition will improve ?Outcome: Progressing ?Goal: Knowledge of secondary prevention will improve (SELECT ALL) ?Outcome: Progressing ?Goal: Knowledge of patient specific risk factors will improve (INDIVIDUALIZE FOR PATIENT) ?Outcome: Progressing ?Goal: Individualized Educational Video(s) ?Outcome: Progressing ?  ?Problem: Coping: ?Goal: Will verbalize positive feelings about self ?Outcome: Progressing ?Goal: Will identify appropriate support needs ?Outcome: Progressing ?  ?Problem: Health Behavior/Discharge Planning: ?Goal: Ability to manage health-related needs will improve ?Outcome: Progressing ?  ?Problem: Self-Care: ?Goal: Ability to participate in self-care as condition permits will improve ?Outcome: Progressing ?Goal: Verbalization of feelings and concerns over difficulty with self-care will improve ?Outcome: Progressing ?Goal: Ability to communicate needs accurately will improve ?Outcome: Progressing ?  ?Problem: Nutrition: ?Goal: Risk of aspiration will decrease ?Outcome: Progressing ?Goal: Dietary intake will improve ?Outcome: Progressing ?  ?Problem: Intracerebral Hemorrhage Tissue Perfusion: ?Goal: Complications of Intracerebral Hemorrhage will be minimized ?Outcome: Progressing ?  ?Problem: Education: ?Goal: Knowledge of General Education information will improve ?Description: Including pain rating scale, medication(s)/side effects and non-pharmacologic comfort measures ?Outcome: Progressing ?  ?Problem: Health Behavior/Discharge Planning: ?Goal: Ability to manage health-related needs will improve ?Outcome: Progressing ?  ?Problem: Clinical Measurements: ?Goal: Ability to maintain clinical measurements within normal limits will  improve ?Outcome: Progressing ?Goal: Will remain free from infection ?Outcome: Progressing ?Goal: Diagnostic test results will improve ?Outcome: Progressing ?Goal: Respiratory complications will improve ?Outcome: Progressing ?Goal: Cardiovascular complication will be avoided ?Outcome: Progressing ?  ?Problem: Activity: ?Goal: Risk for activity intolerance will decrease ?Outcome: Progressing ?  ?Problem: Nutrition: ?Goal: Adequate nutrition will be maintained ?Outcome: Progressing ?  ?Problem: Elimination: ?Goal: Will not experience complications related to bowel motility ?Outcome: Progressing ?Goal: Will not experience complications related to urinary retention ?Outcome: Progressing ?  ?Problem: Pain Managment: ?Goal: General experience of comfort will improve ?Outcome: Progressing ?  ?Problem: Safety: ?Goal: Ability to remain free from injury will improve ?Outcome: Progressing ?  ?Problem: Skin Integrity: ?Goal: Risk for impaired skin integrity will decrease ?Outcome: Progressing ?  ?

## 2021-04-17 NOTE — Discharge Summary (Signed)
[facility]  Physician Discharge Summary   Patient ID: DASANI CREAR MRN: [id number] DOB/AGE: 1965/10/03 56 y.o.  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 Avoid nephrotoxic agents including NSAIDs Monitor blood pressures closely and adjust antihypertensives as indicated   Home Health: Going to cIR Equipment/Devices: None  CODE STATUS: Full code  DISCHARGE CONDITION: fair  Diet recommendation: Renal diet  INITIAL HISTORY: Mr. DONYAE KILNER is a 56 y.o. male with history of HTN though not on treatment presented with L sided weakness, numbness, and facial droop and dysarthria. SBP in 270s en route.  He was hospitalized for further management    HOSPITAL COURSE:   Stroke:  right basal ganglia hematoma likely secondary to microvascular source CT head right basal ganglia IPH with intraventricular extension.  CTA head & neck No vessel stenosis, occlusion, aneurysm, or dissection.  Follow-up CTH stable IPH. MRI  R basal ganglia hematoma with intraventricular extension; stable from follow-up CT. Background small vessel ischemic gliosis with chronic microhemorrhages. 2D Echo LVEF 70-75%, severe LVH, Grade II diastolic dysfunction, LA mild to mod dilated, trivial MVR, Tricuspid aortic valve with mild thickening.  LDL 94 HgbA1c 4.7% VTE prophylaxis - SCDs No antithrombotic due to intracranial hemorrhage.  Will defer initiation of antiplatelets to neurology.   Hypertensive Emergency Hypertensive Encephalopathy Patient has been noncompliant with his  antihypertensives.  He does not follow with physicians on a regular basis.  He was educated on the importance of close follow-up and the importance of blood pressure control.   Required Cleviprex in the intensive care unit. Currently on amlodipine, clonidine and metoprolol with reasonably well-controlled blood pressure.  May need further titration of medication dosage depending on blood pressure trends.   Mentation is back to baseline.   Hyperlipidemia LDL 94., goal < 70 Continue atorvastatin 40 mg  Left ventricular hypertrophy Noted on echocardiogram as well as on EKG.  Needs good blood pressure control.  Diastolic dysfunction was noted on echocardiogram.  Systolic function was normal.  Acute kidney injury/chronic kidney disease unspecified stage/hypokalemia No old labs available.  Presented with elevated creatinine.  Stable.  Renal ultrasound does not show any hydronephrosis.  This is likely hypertensive nephropathy.  Avoid NSAIDs.  Avoid nephrotoxic agents.  Renal function to be monitored closely.  He has reasonably good urine output.  Renal function was discussed in detail with patient and his wife.  Emphasized importance of good blood pressure control. Potassium will be repleted prior to discharge  Patient is stable.  Okay for discharge to [facility] inpatient rehabilitation.   PERTINENT LABS:  The results of significant diagnostics from this hospitalization (including imaging, microbiology, ancillary and laboratory) are listed below for reference.    Microbiology: Recent Results (from the past 240 hour(s))  Resp Panel by RT-PCR (Flu A&B, Covid) Nasopharyngeal Swab     Status: None   Collection Time: 04/13/21  9:25 PM   Specimen: Nasopharyngeal Swab; Nasopharyngeal(NP) swabs in vial transport medium  Result Value Ref Range Status   SARS Coronavirus 2 by RT PCR NEGATIVE NEGATIVE Final    Comment: (NOTE) SARS-CoV-2 target nucleic acids are NOT DETECTED.  The SARS-CoV-2 RNA is  generally detectable in  upper respiratory specimens during the acute phase of infection. The lowest concentration of SARS-CoV-2 viral copies this assay can detect is 138 copies/mL. A negative result does not preclude SARS-Cov-2 infection and should not be used as the sole basis for treatment or other patient management decisions. A negative result may occur with  improper specimen collection/handling, submission of specimen other than nasopharyngeal swab, presence of viral mutation(s) within the areas targeted by this assay, and inadequate number of viral copies(<138 copies/mL). A negative result must be combined with clinical observations, patient history, and epidemiological information. The expected result is Negative.  Fact Sheet for Patients:  EntrepreneurPulse.com.au  Fact Sheet for Healthcare Providers:  IncredibleEmployment.be  This test is no t yet approved or cleared by the [place] FDA and  has been authorized for detection and/or diagnosis of SARS-CoV-2 by FDA under an Emergency Use Authorization (EUA). This EUA will remain  in effect (meaning this test can be used) for the duration of the COVID-19 declaration under Section 564(b)(1) of the Act, 21 U.S.C.section 360bbb-3(b)(1), unless the authorization is terminated  or revoked sooner.       Influenza A by PCR NEGATIVE NEGATIVE Final   Influenza B by PCR NEGATIVE NEGATIVE Final    Comment: (NOTE) The Xpert Xpress SARS-CoV-2/FLU/RSV plus assay is intended as an aid in the diagnosis of influenza from Nasopharyngeal swab specimens and should not be used as a sole basis for treatment. Nasal washings and aspirates are unacceptable for Xpert Xpress SARS-CoV-2/FLU/RSV testing.  Fact Sheet for Patients: EntrepreneurPulse.com.au  Fact Sheet for Healthcare Providers: IncredibleEmployment.be  This test is not yet approved or cleared by the [place] FDA and has been authorized for detection and/or diagnosis of SARS-CoV-2 by FDA under an Emergency Use Authorization (EUA). This EUA will remain in effect (meaning this test can be used) for the duration of the COVID-19 declaration under Section 564(b)(1) of the Act, 21 U.S.C. section 360bbb-3(b)(1), unless the authorization is terminated or revoked.  Performed at [facility], [place] [address], [place], [place] [address]   MRSA Next Gen by PCR, Nasal     Status: None   Collection Time: 04/13/21 10:38 PM   Specimen: Nasal Mucosa; Nasal Swab  Result Value Ref Range Status   MRSA by PCR Next Gen NOT DETECTED NOT DETECTED Final    Comment: (NOTE) The GeneXpert MRSA Assay (FDA approved for NASAL specimens only), is one component of a comprehensive MRSA colonization surveillance program. It is not intended to diagnose MRSA infection nor to guide or monitor treatment for MRSA infections. Test performance is not FDA approved in patients less than 56 years old. Performed at [facility], [place] [address], [place], [place] [address]      Labs:  COVID-19 Labs   Lab Results  Component Value Date   [place] NEGATIVE 04/13/2021      Basic Metabolic Panel: Recent Labs  Lab 04/13/21 2123 04/13/21 2128 04/15/21 0244 04/16/21 1834 04/17/21 0209  NA 138 141 136 136 138  K 3.2* 3.2* 3.1* 3.2* 3.2*  CL 99 100 99 100 99  CO2 27  --  23 25 27   GLUCOSE 105* 104* 122* 137* 95  BUN 18 24* 24* 28* 28*  CREATININE 2.05* 2.10* 2.73* 2.57* 2.41*  CALCIUM 8.9  --  8.6* 8.1* 8.3*  MG  --   --  2.3  --   --    Liver Function Tests: Recent Labs  Lab 04/13/21 2123  AST 46*  ALT 30  ALKPHOS 132*  BILITOT 1.1  PROT 7.8  ALBUMIN 4.0    CBC: Recent Labs  Lab 04/13/21 2123 04/13/21 2128 04/15/21 1012 04/16/21 0750 04/16/21 1834 04/17/21 0209  WBC 7.9  --  13.6* 12.0* 11.3* 10.5  NEUTROABS 5.1  --   --   --   --   --   HGB 14.4 14.6 13.7 12.2* 13.0 12.6*   HCT 41.9 43.0 39.8 35.2* 38.0* 36.5*  MCV 85.7  --  85.8 85.2 86.6 86.5  PLT 191  --  209 186 182 183     CBG: Recent Labs  Lab 04/13/21 2113  GLUCAP 104*     IMAGING STUDIES CT HEAD WO CONTRAST (5MM)  Result Date: 04/14/2021 CLINICAL DATA:  Hemorrhage follow-up EXAM: CT HEAD WITHOUT CONTRAST TECHNIQUE: Contiguous axial images were obtained from the base of the skull through the vertex without intravenous contrast. RADIATION DOSE REDUCTION: This exam was performed according to the departmental dose-optimization program which includes automated exposure control, adjustment of the mA and/or kV according to patient size and/or use of iterative reconstruction technique. COMPARISON:  04/13/2021 FINDINGS: Brain: Unchanged size of intraparenchymal hematoma centered in the right basal ganglia with extension into the right lateral ventricle. No hydrocephalus. 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 Vascular: No hyperdense vessel or unexpected calcification. Skull: Normal. Negative for fracture or focal lesion. Sinuses/Orbits: No acute finding. Other: None. IMPRESSION: Unchanged size of intraparenchymal hematoma centered in the right basal ganglia with intraventricular extension. No hydrocephalus. Electronically Signed   By: Ulyses Jarred M.D.   On: 04/14/2021 03:58   MR BRAIN WO CONTRAST  Result Date: 04/14/2021 CLINICAL DATA:  Stroke follow-up EXAM: MRI HEAD WITHOUT CONTRAST TECHNIQUE: Multiplanar, multiecho pulse sequences of the brain and surrounding structures were obtained without intravenous contrast. COMPARISON:  Head CT from earlier today FINDINGS: Brain: Known acute hemorrhage in the right basal ganglia with intraventricular extension. No detected progression given cross modality differences. No hydrocephalus. There have been other prior microhemorrhages in the white matter, cerebellum, and brainstem, likely hypertensive. Gliosis in the pons and cerebral white matter which is  extensive, with lacune at the left centrum semiovale and remote perforator infarct at the right basal ganglia. Constellation of findings correlate with chart history of hypertension. Brain volume is normal. Accounting for susceptibility artifact no acute infarct is seen adjacent to the hematoma. No gross mass lesion either. Vascular: Preceding CTA. Skull and upper cervical spine: Negative Sinuses/Orbits: Negative IMPRESSION: 1. Right basal ganglia hematoma with intraventricular extension that is stable from prior head CT. No hydrocephalus. 2. Hemorrhage location and background small vessel ischemic gliosis with chronic microhemorrhages suggests microvascular cause. Electronically Signed   By: Jorje Guild M.D.   On: 04/14/2021 10:43   US RENAL  Result Date: 04/17/2021 CLINICAL DATA:  AK I EXAM: RENAL / URINARY TRACT ULTRASOUND COMPLETE COMPARISON:  None. FINDINGS: Right Kidney: Renal measurements: 8.5 x 4.9 x 4.7 cm = volume: 103 mL. Echogenicity appears minimally increased, possibly due to technical related factors. No mass or hydronephrosis visualized. Left Kidney: Renal measurements: 9.1 x 5.3 x 5.2 cm = volume: 132 mL. Echogenicity appears minimally increased, possibly due to technical related factors. No mass or hydronephrosis visualized. Bladder: Appears normal for degree of bladder distention. Other: None. IMPRESSION: No hydronephrosis. Electronically Signed   By: Valentino Saxon M.D.   On: 04/17/2021 10:20   DG Swallowing Func-Speech Pathology  Result Date: 04/14/2021 Table formatting from the original result was not included.  Objective Swallowing Evaluation: Type of Study: MBS-Modified Barium Swallow Study  Patient Details Name: MICHAELANTHONY KEMPTON MRN: [id number] Date of Birth: 08/05/1965 Today's Date: 04/14/2021 Time: SLP Start Time (ACUTE ONLY): 1415 -SLP Stop Time (ACUTE ONLY): 1430 SLP Time Calculation (min) (ACUTE ONLY): 15 min Past Medical History: Past Medical History: Diagnosis Date   Hypertension  Past Surgical History: No past surgical history on file. HPI: Pt is a 56 y.o. male who presented to the ED for evaluation of sudden onset of left-sided weakness, left facial droop and slurred speech along with left-sided numbness. 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 No hydrocephalus. Small focus of hyperdensity at the anterior right temporal lobe, possibly a small amount of subarachnoid hemorrhage or a cavernous  malformation. PMH: hypertension.  No data recorded  Recommendations for follow up therapy are one component of a multi-disciplinary discharge planning process, led by the attending physician.  Recommendations may be updated based on patient status, additional functional criteria and insurance authorization. Assessment / Plan / Recommendation Clinical Impressions 04/14/2021 Clinical Impression Pt presents with mild oropharyngeal dysphagia characterized by reduced bolus cohesion, and a pharyngeal delay. He exhibited premature spillage to the valleculae and pyriform sinuses. With liquids, the swallow was triggered with ~50% of the bolus at the level of the pyriform sinuses. Penetration (PAS 3) was noted with the barium tablet which was swallowed with thin liquids via straw. Coughing was noted thereafter; aspiration of the penetrate is suspected, but cannot be conclusively determined since pt moved out of the visual field and no aspirate was noted after coughing. Throat clearing was demonstrated throughout the study, but this did not align with any instances of laryngeal invasion. A regular texture diet with thin liquids is recommended at this time. SLP will continue to follow pt. SLP Visit Diagnosis Dysphagia, pharyngeal phase (R13.13) Attention and concentration deficit following -- Frontal lobe and executive function deficit following -- Impact on safety and function Mild aspiration risk   Treatment Recommendations 04/14/2021  Treatment Recommendations Therapy as outlined in treatment plan below   Prognosis 04/14/2021 Prognosis for Safe Diet Advancement Good Barriers to Reach Goals -- Barriers/Prognosis Comment -- Diet Recommendations 04/14/2021 SLP Diet Recommendations Regular solids;Thin liquid Liquid Administration via Cup;Straw Medication Administration Whole meds with puree Compensations Slow rate;Small sips/bites Postural Changes Seated upright at 90 degrees   Other Recommendations 04/14/2021 Recommended Consults -- Oral Care Recommendations Oral care BID Other Recommendations -- Follow Up Recommendations (No Data) Assistance recommended at discharge Intermittent Supervision/Assistance Functional Status Assessment Patient has had a recent decline in their functional status and demonstrates the ability to make significant improvements in function in a reasonable and predictable amount of time. Frequency and Duration  04/14/2021 Speech Therapy Frequency (ACUTE ONLY) min 2x/week Treatment Duration 2 weeks   Oral Phase 04/14/2021 Oral Phase Impaired Oral - Pudding Teaspoon -- Oral - Pudding Cup -- Oral - Honey Teaspoon -- Oral - Honey Cup -- Oral - Nectar Teaspoon -- Oral - Nectar Cup Decreased bolus cohesion;Premature spillage Oral - Nectar Straw Decreased bolus cohesion;Premature spillage Oral - Thin Teaspoon -- Oral - Thin Cup Decreased bolus cohesion;Premature spillage Oral - Thin Straw Decreased bolus cohesion;Premature spillage Oral - Puree WFL Oral - Mech Soft -- Oral - Regular WFL Oral - Multi-Consistency -- Oral - Pill Decreased bolus cohesion;Premature spillage Oral Phase - Comment --  Pharyngeal Phase 04/14/2021 Pharyngeal Phase Impaired Pharyngeal- Pudding Teaspoon -- Pharyngeal -- Pharyngeal- Pudding Cup -- Pharyngeal -- Pharyngeal- Honey Teaspoon --  Pharyngeal -- Pharyngeal- Honey Cup -- Pharyngeal -- Pharyngeal- Nectar Teaspoon -- Pharyngeal -- Pharyngeal- Nectar Cup -- Pharyngeal -- Pharyngeal- Nectar Straw Delayed swallow  initiation-vallecula;Delayed swallow initiation-pyriform sinuses Pharyngeal -- Pharyngeal- Thin Teaspoon -- Pharyngeal -- Pharyngeal- Thin Cup Delayed swallow initiation-vallecula;Delayed swallow initiation-pyriform sinuses Pharyngeal -- Pharyngeal- Thin Straw Delayed swallow initiation-vallecula;Delayed swallow initiation-pyriform sinuses Pharyngeal -- Pharyngeal- Puree Delayed swallow initiation-vallecula Pharyngeal -- Pharyngeal- Mechanical Soft -- Pharyngeal -- Pharyngeal- Regular WFL Pharyngeal -- Pharyngeal- Multi-consistency -- Pharyngeal -- Pharyngeal- Pill Delayed swallow initiation-pyriform sinuses Pharyngeal -- Pharyngeal Comment --  Cervical Esophageal Phase  04/14/2021 Cervical Esophageal Phase WFL Pudding Teaspoon -- Pudding Cup -- Honey Teaspoon -- Honey Cup -- Nectar Teaspoon -- Nectar Cup -- Nectar Straw -- Thin Teaspoon -- Thin Cup -- Thin Straw -- Puree -- Mechanical Soft -- Regular -- Multi-consistency -- Pill -- Cervical Esophageal Comment -- Shanika I. Hardin Negus, [place], [place] Office number [phone] Pager [phone] Horton Marshall 04/14/2021, 4:05 PM                     ECHOCARDIOGRAM COMPLETE  Result Date: 04/14/2021    ECHOCARDIOGRAM REPORT   Patient Name:   JESSICA SEIDMAN Date of Exam: 04/14/2021 Medical Rec #:  [id number]     Height: Accession #:    [id number]    Weight:       177.2 lb Date of Birth:  1965/03/26      BSA:          1.863 m Patient Age:    65 years      BP:           151/88 mmHg Patient Gender: M             HR:           102 bpm. Exam Location:  Inpatient Procedure: 2D Echo, Cardiac Doppler and Color Doppler Indications:    I63.9 STROKE  History:        Patient has no prior history of Echocardiogram examinations.                 Risk Factors:Hypertension.  Sonographer:    Beryle Beams Referring Phys: [id number] ASHISH ARORA IMPRESSIONS  1. Left ventricular ejection fraction, by estimation, is 70 to 75%. The left ventricle has hyperdynamic  function. The left ventricle has no regional wall motion abnormalities. There is severe concentric left ventricular hypertrophy. Left ventricular diastolic parameters are consistent with Grade II diastolic dysfunction (pseudonormalization).  2. Right ventricular systolic function is normal. The right ventricular size is normal.  3. Left atrial size was mild to moderately dilated.  4. The mitral valve is normal in structure. Trivial mitral valve regurgitation. No evidence of mitral stenosis.  5. The aortic valve is tricuspid. There is mild thickening of the aortic valve. Aortic valve regurgitation is not visualized. Aortic valve sclerosis is present, with no evidence of aortic valve stenosis.  6. The inferior vena cava is normal in size with <50% respiratory variability, suggesting right atrial pressure of 8 mmHg.  7. Given severe LVH which may be related to hypertensive heart disease, would also consider CMR to assess for possible HCM. Comparison(s): No prior Echocardiogram. Conclusion(s)/Recommendation(s): No intracardiac source of embolism detected on this transthoracic study. Consider a transesophageal echocardiogram to exclude cardiac source of embolism if clinically indicated. FINDINGS  Left Ventricle: Left ventricular ejection fraction, by estimation, is 70 to 75%. The left ventricle has hyperdynamic function. The left ventricle has no regional  wall motion abnormalities. The left ventricular internal cavity size was normal in size. There is severe concentric left ventricular hypertrophy. Left ventricular diastolic parameters are consistent with Grade II diastolic dysfunction (pseudonormalization). Right Ventricle: The right ventricular size is normal. Right vetricular wall thickness was not well visualized. Right ventricular systolic function is normal. Left Atrium: Left atrial size was mild to moderately dilated. Right Atrium: Right atrial size was normal in size. Pericardium: There is no evidence of  pericardial effusion. Mitral Valve: The mitral valve is normal in structure. There is mild thickening of the mitral valve leaflet(s). Trivial mitral valve regurgitation. No evidence of mitral valve stenosis. Tricuspid Valve: The tricuspid valve is normal in structure. Tricuspid valve regurgitation is trivial. Aortic Valve: The aortic valve is tricuspid. There is mild thickening of the aortic valve. Aortic valve regurgitation is not visualized. Aortic valve sclerosis is present, with no evidence of aortic valve stenosis. Aortic valve mean gradient measures 12.0 mmHg. Aortic valve peak gradient measures 23.8 mmHg. Aortic valve area, by VTI measures 1.44 cm. Pulmonic Valve: The pulmonic valve was normal in structure. Pulmonic valve regurgitation is trivial. Aorta: The aortic root is normal in size and structure. Venous: The inferior vena cava is normal in size with less than 50% respiratory variability, suggesting right atrial pressure of 8 mmHg. IAS/Shunts: The atrial septum is grossly normal.  LEFT VENTRICLE PLAX 2D LVIDd:         4.10 cm      Diastology LVIDs:         2.20 cm      LV e' medial:    7.58 cm/s LV PW:         1.30 cm      LV E/e' medial:  15.2 LV IVS:        1.50 cm      LV e' lateral:   8.21 cm/s LVOT diam:     1.70 cm      LV E/e' lateral: 14.0 LV SV:         49 LV SV Index:   27 LVOT Area:     2.27 cm  LV Volumes (MOD) LV vol d, MOD A2C: 98.4 ml LV vol d, MOD A4C: 108.0 ml LV vol s, MOD A2C: 44.4 ml LV vol s, MOD A4C: 51.0 ml LV SV MOD A2C:     54.0 ml LV SV MOD A4C:     108.0 ml LV SV MOD BP:      62.6 ml RIGHT VENTRICLE             IVC RV S prime:     20.50 cm/s  IVC diam: 1.90 cm RVOT diam:      2.00 cm TAPSE (M-mode): 2.9 cm LEFT ATRIUM             Index        RIGHT ATRIUM           Index LA diam:        4.30 cm 2.31 cm/m   RA Area:     14.20 cm LA Vol (A2C):   73.3 ml 39.34 ml/m  RA Volume:   35.60 ml  19.11 ml/m LA Vol (A4C):   68.7 ml 36.87 ml/m LA Biplane Vol: 74.4 ml 39.93 ml/m   AORTIC VALVE                     PULMONIC VALVE AV Area (Vmax):    1.43 cm  PV Vmax:       0.69 m/s AV Area (Vmean):   1.42 cm      PV Vmean:      43.200 cm/s AV Area (VTI):     1.44 cm      PV VTI:        0.107 m AV Vmax:           244.00 cm/s   PV Peak grad:  1.9 mmHg AV Vmean:          160.000 cm/s  PV Mean grad:  1.0 mmHg AV VTI:            0.343 m AV Peak Grad:      23.8 mmHg AV Mean Grad:      12.0 mmHg LVOT Vmax:         154.00 cm/s LVOT Vmean:        100.000 cm/s LVOT VTI:          0.218 m LVOT/AV VTI ratio: 0.64  AORTA Ao Root diam: 2.50 cm Ao Asc diam:  2.80 cm MITRAL VALVE MV Area (PHT): 4.63 cm     SHUNTS MV Decel Time: 164 msec     Systemic VTI:  0.22 m MR Peak grad: 51.6 mmHg     Systemic Diam: 1.70 cm MR Mean grad: 33.0 mmHg     Pulmonic Diam: 2.00 cm MR Vmax:      359.00 cm/s MR Vmean:     265.0 cm/s MV E velocity: 115.00 cm/s MV A velocity: 78.30 cm/s MV E/A ratio:  1.47 Gwyndolyn Kaufman MD Electronically signed by Gwyndolyn Kaufman MD Signature Date/Time: 04/14/2021/11:27:08 AM    Final    CT HEAD CODE STROKE WO CONTRAST  Result Date: 04/13/2021 CLINICAL DATA:  Code stroke. EXAM: CT HEAD WITHOUT CONTRAST TECHNIQUE: Contiguous axial images were obtained from the base of the skull through the vertex without intravenous contrast. RADIATION DOSE REDUCTION: This exam was performed according to the departmental dose-optimization program which includes automated exposure control, adjustment of the mA and/or kV according to patient size and/or use of iterative reconstruction technique. COMPARISON:  None. FINDINGS: Brain: Right basal ganglia intraparenchymal hematoma measures 2.6 x 1.7 x 3.0 cm. There is intraventricular extension of blood. No hydrocephalus. Mass effect on the right lateral ventricle without midline shift. Old right corpus striatum small vessel infarct. Small focus of hyperdensity at the anterior right temporal lobe. Vascular: No abnormal hyperdensity of the major intracranial  arteries or dural venous sinuses. No intracranial atherosclerosis. Skull: The visualized skull base, calvarium and extracranial soft tissues are normal. Sinuses/Orbits: No fluid levels or advanced mucosal thickening of the visualized paranasal sinuses. No mastoid or middle ear effusion. The orbits are normal. IMPRESSION: 1. Right basal ganglia intraparenchymal hematoma (2.6 x 1.7 x 3.0 cm) with intraventricular extension of blood. No hydrocephalus. 2. Small focus of hyperdensity at the anterior right temporal lobe, possibly a small amount of subarachnoid hemorrhage or a cavernous malformation. Critical Value/emergent results were called by telephone at the time of interpretation on 04/13/2021 at 9:43 pm to provider [facility] , who verbally acknowledged these results. Electronically Signed   By: Ulyses Jarred M.D.   On: 04/13/2021 21:43   CT ANGIO HEAD NECK W WO CM (CODE STROKE)  Result Date: 04/13/2021 CLINICAL DATA:  Intracranial hemorrhage EXAM: CT ANGIOGRAPHY HEAD AND NECK TECHNIQUE: Multidetector CT imaging of the head and neck was performed using the standard protocol during bolus administration of intravenous contrast. Multiplanar CT image reconstructions  and MIPs were obtained to evaluate the vascular anatomy. Carotid stenosis measurements (when applicable) are obtained utilizing NASCET criteria, using the distal internal carotid diameter as the denominator. RADIATION DOSE REDUCTION: This exam was performed according to the departmental dose-optimization program which includes automated exposure control, adjustment of the mA and/or kV according to patient size and/or use of iterative reconstruction technique. CONTRAST:  56mL OMNIPAQUE IOHEXOL 350 MG/ML SOLN COMPARISON:  None. FINDINGS: CTA NECK FINDINGS SKELETON: There is no bony spinal canal stenosis. No lytic or blastic lesion. OTHER NECK: Normal pharynx, larynx and major salivary glands. No cervical lymphadenopathy. Left hemithyroidectomy. UPPER  CHEST: No pneumothorax or pleural effusion. No nodules or masses. AORTIC ARCH: There is no calcific atherosclerosis of the aortic arch. There is no aneurysm, dissection or hemodynamically significant stenosis of the visualized portion of the aorta. Conventional 3 vessel aortic branching pattern. The visualized proximal subclavian arteries are widely patent. RIGHT CAROTID SYSTEM: Normal without aneurysm, dissection or stenosis. LEFT CAROTID SYSTEM: Normal without aneurysm, dissection or stenosis. VERTEBRAL ARTERIES: Left dominant configuration. Both origins are clearly patent. There is no dissection, occlusion or flow-limiting stenosis to the skull base (V1-V3 segments). CTA HEAD FINDINGS POSTERIOR CIRCULATION: --Vertebral arteries: Normal V4 segments. --Inferior cerebellar arteries: Normal. --Basilar artery: Normal. --Superior cerebellar arteries: Normal. --Posterior cerebral arteries (PCA): Normal. ANTERIOR CIRCULATION: --Intracranial internal carotid arteries: Normal. --Anterior cerebral arteries (ACA): Normal. Both A1 segments are present. Patent anterior communicating artery (a-comm). --Middle cerebral arteries (MCA): Normal. VENOUS SINUSES: As permitted by contrast timing, patent. ANATOMIC VARIANTS: None Review of the MIP images confirms the above findings. IMPRESSION: 1. No aneurysm, dissection, occlusion or hemodynamically significant stenosis of the major cervical or intracranial arteries. Electronically Signed   By: Ulyses Jarred M.D.   On: 04/13/2021 21:37    DISCHARGE EXAMINATION: Vitals:   04/17/21 0401 04/17/21 0410 04/17/21 0536 04/17/21 0935  BP:  (!) 150/90 (!) 160/80 (!) 161/101  Pulse: 73  74 72  Resp: (!) 24  19 17   Temp: 99.1 F (37.3 C)  98.4 F (36.9 C) 99 F (37.2 C)  TempSrc: Axillary  Oral Oral  SpO2: 98%  99% 99%  Weight:      Height:       General appearance: Awake alert.  In no distress Resp: Clear to auscultation bilaterally.  Normal effort Cardio: S1-S2 is normal  regular.  No S3-S4.  No rubs murmurs or bruit GI: Abdomen is soft.  Nontender nondistended.  Bowel sounds are present normal.  No masses organomegaly Extremities: No edema.  Full range of motion of lower extremities. Neurologic: Alert and oriented x3.  Left-sided hemiparesis   DISPOSITION: CIR     Current Inpatient Medications: Scheduled:  amLODipine  10 mg Oral Daily   atorvastatin  40 mg Oral QHS   Chlorhexidine Gluconate Cloth  6 each Topical Daily   cloNIDine  0.2 mg Oral TID   metoprolol tartrate  50 mg Oral BID   pantoprazole  40 mg Oral QHS   senna-docusate  1 tablet Oral BID   Continuous: [id number]:[id number] **OR** acetaminophen (TYLENOL) oral liquid 160 mg/5 mL **OR** acetaminophen, hydrALAZINE, labetalol, ondansetron (ZOFRAN) IV    TOTAL DISCHARGE TIME: 35 minutes  Caleb Decock [employer] on [email or web address]  04/17/2021, 11:04 AM

## 2021-04-18 ENCOUNTER — Inpatient Hospital Stay (HOSPITAL_COMMUNITY): Payer: Self-pay

## 2021-04-18 ENCOUNTER — Encounter (HOSPITAL_COMMUNITY): Payer: Self-pay

## 2021-04-18 LAB — COMPREHENSIVE METABOLIC PANEL
ALT: 36 U/L (ref 0–44)
AST: 37 U/L (ref 15–41)
Albumin: 3.1 g/dL — ABNORMAL LOW (ref 3.5–5.0)
Alkaline Phosphatase: 85 U/L (ref 38–126)
Anion gap: 7 (ref 5–15)
BUN: 27 mg/dL — ABNORMAL HIGH (ref 6–20)
CO2: 27 mmol/L (ref 22–32)
Calcium: 8.3 mg/dL — ABNORMAL LOW (ref 8.9–10.3)
Chloride: 102 mmol/L (ref 98–111)
Creatinine, Ser: 2.21 mg/dL — ABNORMAL HIGH (ref 0.61–1.24)
GFR, Estimated: 35 mL/min — ABNORMAL LOW (ref >60)
Glucose, Bld: 98 mg/dL (ref 70–99)
Potassium: 3.2 mmol/L — ABNORMAL LOW (ref 3.5–5.1)
Sodium: 136 mmol/L (ref 135–145)
Total Bilirubin: 0.7 mg/dL (ref 0.3–1.2)
Total Protein: 6.3 g/dL — ABNORMAL LOW (ref 6.5–8.1)

## 2021-04-18 LAB — CBC WITH DIFFERENTIAL/PLATELET
Abs Immature Granulocytes: 0.02 10*3/uL (ref 0.00–0.07)
Basophils Absolute: 0.1 10*3/uL (ref 0.0–0.1)
Basophils Relative: 1 %
Eosinophils Absolute: 0.1 10*3/uL (ref 0.0–0.5)
Eosinophils Relative: 1 %
HCT: 37 % — ABNORMAL LOW (ref 39.0–52.0)
Hemoglobin: 12.6 g/dL — ABNORMAL LOW (ref 13.0–17.0)
Immature Granulocytes: 0 %
Lymphocytes Relative: 14 %
Lymphs Abs: 1.2 10*3/uL (ref 0.7–4.0)
MCH: 29.5 pg (ref 26.0–34.0)
MCHC: 34.1 g/dL (ref 30.0–36.0)
MCV: 86.7 fL (ref 80.0–100.0)
Monocytes Absolute: 0.7 10*3/uL (ref 0.1–1.0)
Monocytes Relative: 8 %
Neutro Abs: 6.7 10*3/uL (ref 1.7–7.7)
Neutrophils Relative %: 76 %
Platelets: 175 10*3/uL (ref 150–400)
RBC: 4.27 MIL/uL (ref 4.22–5.81)
RDW: 13.6 % (ref 11.5–15.5)
WBC: 8.8 10*3/uL (ref 4.0–10.5)
nRBC: 0 % (ref 0.0–0.2)

## 2021-04-18 MED ORDER — FLUTICASONE PROPIONATE 50 MCG/ACT NA SUSP
1.0000 | Freq: Every day | NASAL | Status: DC
  Administered 2021-04-18 – 2021-05-15 (×13): 1 via NASAL
  Filled 2021-04-18: qty 16

## 2021-04-18 MED ORDER — POTASSIUM CHLORIDE CRYS ER 20 MEQ PO TBCR
40.0000 meq | EXTENDED_RELEASE_TABLET | Freq: Two times a day (BID) | ORAL | Status: AC
  Administered 2021-04-18 (×2): 40 meq via ORAL
  Filled 2021-04-18 (×2): qty 2

## 2021-04-18 MED ORDER — HYDRALAZINE HCL 25 MG PO TABS
25.0000 mg | ORAL_TABLET | Freq: Once | ORAL | Status: AC
  Administered 2021-04-18: 25 mg via ORAL
  Filled 2021-04-18: qty 1

## 2021-04-18 NOTE — Progress Notes (Signed)
?                                                       PROGRESS NOTE ? ? ?Subjective/Complaints: ? ?Pt reports nose congested- started last night- is not normal for him- but couldn't breathe through nose.  ?LBM this Am and yesterday afternoon.  ? ?Slept OK ? ?ROS: ? ?Pt denies SOB, abd pain, CP, N/V/C/D, and vision changes ? ? ? ?Objective: ?  ?US RENAL ? ?Result Date: 04/17/2021 ?CLINICAL DATA:  AK I EXAM: RENAL / URINARY TRACT ULTRASOUND COMPLETE COMPARISON:  None. FINDINGS: Right Kidney: Renal measurements: 8.5 x 4.9 x 4.7 cm = volume: 103 mL. Echogenicity appears minimally increased, possibly due to technical related factors. No mass or hydronephrosis visualized. Left Kidney: Renal measurements: 9.1 x 5.3 x 5.2 cm = volume: 132 mL. Echogenicity appears minimally increased, possibly due to technical related factors. No mass or hydronephrosis visualized. Bladder: Appears normal for degree of bladder distention. Other: None. IMPRESSION: No hydronephrosis. Electronically Signed   By: Valentino Saxon M.D.   On: 04/17/2021 10:20   ?Recent Labs  ?  04/17/21 ?0209 04/18/21 ?0542  ?WBC 10.5 8.8  ?HGB 12.6* 12.6*  ?HCT 36.5* 37.0*  ?PLT 183 175  ? ?Recent Labs  ?  04/17/21 ?0209 04/18/21 ?0542  ?NA 138 136  ?K 3.2* 3.2*  ?CL 99 102  ?CO2 27 27  ?GLUCOSE 95 98  ?BUN 28* 27*  ?CREATININE 2.41* 2.21*  ?CALCIUM 8.3* 8.3*  ? ? ?Intake/Output Summary (Last 24 hours) at 04/18/2021 0825 ?Last data filed at 04/18/2021 1610 ?Gross per 24 hour  ?Intake 120 ml  ?Output 400 ml  ?Net -280 ml  ?  ? ?  ? ?Physical Exam: ?Vital Signs ?Blood pressure (!) 157/101, pulse 70, temperature 98.8 ?F (37.1 ?C), resp. rate 18, height 5\' 4"  (1.626 m), weight 75.9 kg, SpO2 100 %. ? ? ?General: awake, alert, appropriate, sitting up in bed; brushing teeth; wife at bedside; NAD ?HENT: conjugate gaze; oropharynx moist- sounds nasally; ?CV: regular rate; no JVD ?Pulmonary: CTA B/L; no W/R/R- good air movement ?GI: soft, NT, ND, (+)BS ?Psychiatric:  appropriate, flat ?Neurological: alert- L side of face still absent sensation ?Musculoskeletal:  ?   Cervical back: Neck supple. No tenderness.  ?   Comments: RUE and RLE 5/5 except R HF 5-/5 ?LUE 0/5 in deltoid, biceps, triceps, WE, grip and FA ?LLE- 0/5 except L PF 1/5 ?  ?Skin: ?   General: Skin is warm and dry.  ?   Comments: B/L forearm IV"s not being used- look OK  ?Neurological:  ?   Mental Status: He is alert and oriented to person, place, and time.  ?   Comments: Mild left facial weakness with left inattention. Speech clear and able to follow commands without difficulty. Dense left hemiplegia with sensory deficits. ?Moderate to severe L neglect noted ?Absent sensation to light touch in LUE/LLE and L face  ? ?Assessment/Plan: ?1. Functional deficits which require 3+ hours per day of interdisciplinary therapy in a comprehensive inpatient rehab setting. ?Physiatrist is providing close team supervision and 24 hour management of active medical problems listed below. ?Physiatrist and rehab team continue to assess barriers to discharge/monitor patient progress toward functional and medical goals ? ?Care Tool: ? ?Bathing ?   ?   ?   ?  ?  ?  Bathing assist   ?  ?  ?Upper Body Dressing/Undressing ?Upper body dressing   ?What is the patient wearing?: Pull over shirt ?   ?Upper body assist Assist Level: Maximal Assistance - Patient 25 - 49% ?   ?Lower Body Dressing/Undressing ?Lower body dressing ? ? ?   ?What is the patient wearing?: Incontinence brief ? ?  ? ?Lower body assist Assist for lower body dressing: Dependent - Patient 0% ?   ? ?Toileting ?Toileting Toileting Activity did not occur (Probation officer and hygiene only): N/A (no void or bm)  ?Toileting assist Assist for toileting: Moderate Assistance - Patient 50 - 74% ?  ?  ?Transfers ?Chair/bed transfer ? ?Transfers assist ?   ? ?  ?  ?  ?Locomotion ?Ambulation ? ? ?Ambulation assist ? ?   ? ?  ?  ?   ? ?Walk 10 feet activity ? ? ?Assist ?   ? ?  ?    ? ?Walk 50 feet activity ? ? ?Assist   ? ?  ?   ? ? ?Walk 150 feet activity ? ? ?Assist   ? ?  ?  ?  ? ?Walk 10 feet on uneven surface  ?activity ? ? ?Assist   ? ? ?  ?   ? ?Wheelchair ? ? ? ? ?Assist   ?  ?  ? ?  ?   ? ? ?Wheelchair 50 feet with 2 turns activity ? ? ? ?Assist ? ?  ?  ? ? ?   ? ?Wheelchair 150 feet activity  ? ? ? ?Assist ?   ? ? ?   ? ?Blood pressure (!) 157/101, pulse 70, temperature 98.8 ?F (37.1 ?C), resp. rate 18, height 5\' 4"  (1.626 m), weight 75.9 kg, SpO2 100 %. ? ?Medical Problem List and Plan: ?1. 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            -patient may  shower ?            -ELOS/Goals: 14-20 days- min A to supervision ? First day of evaluations- Con't PT, OT and SLP ?2.  Antithrombotics: ?-DVT/anticoagulation:  Pharmaceutical: Lovenox--5 days out from bleed. Will start lovenox for DVT prophylaxis. Check dopplers due to HP and high risk for DVT ?            -antiplatelet therapy: N/A due to bleed.  ?3. Pain Management:: On tylenol as needed ?4. Mood: LCSW to follow for evaluation an dsup ?            -antipsychotic agents: N/A ?5. Neuropsych: This patient is capable of making decisions on his own behalf. ?6. Skin/Wound Care: Routine pressure relief measures.  ?7. Fluids/Electrolytes/Nutrition: Monitor I/O. Check CMET in am.  ?8. HTN: Continues to be poorly controlled requiring prn meds to keep SBP< 160 ?            --continue Catapress 2 mg TID, metoprolol 50 mg BID and Norvasc 10 md/d. ? 3/3- BP 157/101 this AM- just made med changes- will wait/monitor trend for 24-48 hours ?9. Persistent hypokalemia: Start Kdur 20 meq daily. Check Mg level ? 3/3- Will replete 40 mEq x2 and recheck Monday- last Mg on 2/28 was 2.3 ?10.  Severe LVH: Has been referred to cardiology for work up. Continue metoprolol bid.  ?11. 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  ?--Renal ultrasound with mild evidence of  medical renal disease ?            --encourage fluid intake. ?3/3- down slightly to 2.21- will avoid nephrotoxic agents  ?12, Elevated AST: recheck in am. ?13. Hyperlipidemia: Continue Lipitor.  ?14. ABLA: Likely due to bleed with drop from 14.4-->12.6.  ?            --monitor for signs of bleeding. Recheck CBC in am.  ?15. L neglect and lethargy-will wait on Amantadine or Ritalin at this time.  ? 3/3- slightly more awake this AM- con't to wait on Amantadine at this time.  ?16. Noncompliance- hadn't taken BP med sin months per pt.  ? ?I spent a total of 36   minutes on total care today- >50% coordination of care- due to prolonged d/w wife and pt and nursing about BP.  ? ?  ? ?LOS: ?1 days ?A FACE TO FACE EVALUATION WAS PERFORMED ? ?Keileigh Vahey ?04/18/2021, 8:25 AM  ? ? ? ?

## 2021-04-18 NOTE — Evaluation (Signed)
Speech Language Pathology Assessment and Plan  Patient Details  Name: Miguel Mccullough MRN: [id number] Date of Birth: 1965/03/09  SLP Diagnosis: Dysarthria;Cognitive Impairments;Dysphagia  Rehab Potential: Good ELOS: 3 weeks   Today's Date: 04/18/2021 SLP Individual Time: 1100-1200 SLP Individual Time Calculation (min): 60 min  Hospital Problem: Principal Problem:   ICH (intracerebral hemorrhage) ([place])  Past Medical History:  Past Medical History:  Diagnosis Date   COVID-19 2021   continues to have increase in suptum production.   Hypertension    Past Surgical History: History reviewed. No pertinent surgical history.  Assessment / Plan / Recommendation Clinical Impression Miguel Mccullough is a 56 year old R handed male with history of HTN but no medications (had stopped meds and not followed up with PCP) who was admitted on 04/13/21 with sudden onset of slurred speech with left-sided numbness followed by weakness and BP noted to be elevated in 270s in route to the hospital.  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 He was noted to have cognitive deficits with poor safety awareness as well as mild oropharyngeal dysphagia with premature spillage and currently tolerating regular diet. Patient with resultant left-sided weakness with numbness, left facial droop with slurred speech as well as mild dysphagia affecting overall functional status.  CIR was recommended due to functional decline.  Patient presents with mid dysarthria characterized by decreased articulatory precision and reduced vocal intensity which impacted speech intelligibly at the conversation level. Pt was perceived as 75-90% intelligible which was likely impacted by fatigue during time of evaluation. Pt appeared aware of speech errors with frequent attempts to self correct.   Pt and spouse endorse tolerance of regular textures and thin liquids while implementing lingual  sweep to clear mild left pocketing and anterior spillage secondary to mild left facial and labial weakness. Pt consumed single sips of thin liquids by straw without overt s/sx of aspiration and good oral containment. Solid PO trials deferred due to drowsiness/reduced alertness. Recommend continuation of current diet with set-up A and intermittent supervision during meals.   Patient was oriented x4, demonstrated appropriate 3 word recall from BIMS earlier as given by another therapist, and intellectual awareness of deficits. Pt also exhibited significant left inattention. Attention was further impacted by drowsiness and falling asleep throughout session. Both pt and spouse denied any observations of cognitive changes at this time and expressed pt was assisting with some work duties effectively and accurately over the phone as evidenced by spouse. SLP was unable to perform formal cognitive-linguistic assessment secondary to pt's drowsiness therefore recommend additional testing at following session. Per Acute Care SLP, pt exhibited difficulty in the areas of awareness, attention, memory, temporal orientation, complex problem solving, and executive function. Patient would benefit from skilled SLP intervention to maximize cognitive, speech, and swallow functioning and overall functional independence prior to discharge.    Skilled Therapeutic Interventions          Pt participated in assessments of cognitive-linguistic, speech, and language function. Please see above.     SLP Assessment  Patient will need skilled Speech Lanaguage Pathology Services during CIR admission    Recommendations  SLP Diet Recommendations: Age appropriate regular solids;Thin Liquid Administration via: Straw;Cup Medication Administration: Whole meds with liquid Supervision: Staff to assist with self feeding;Intermittent supervision to cue for compensatory strategies Compensations: Slow rate;Small sips/bites;Monitor for anterior  loss Postural Changes and/or Swallow Maneuvers: Seated upright 90 degrees Oral Care Recommendations: Oral care BID Patient destination: Home Follow up Recommendations:  Outpatient SLP;Home Health SLP Equipment Recommended: None recommended by SLP    SLP Frequency 3 to 5 out of 7 days   SLP Duration  SLP Intensity  SLP Treatment/Interventions 3 weeks  Minumum of 1-2 x/day, 30 to 90 minutes  Patient/family education;Dysphagia/aspiration precaution training;Cognitive remediation/compensation;Speech/Language facilitation;Functional tasks;Therapeutic Activities    Pain    Prior Functioning Cognitive/Linguistic Baseline: Within functional limits Type of Home: House  Lives With: Spouse;Family Available Help at Discharge: Available 24 hours/day;Family Education: HS Vocation: Full time employment  SLP Evaluation Cognition Overall Cognitive Status: Impaired/Different from baseline Arousal/Alertness: Awake/alert Orientation Level: Oriented X4 Year: 2023 Month: March Day of Week: Correct Attention: Sustained Focused Attention: Impaired (drowsy) Sustained Attention: Impaired Problem Solving: Impaired Problem Solving Impairment: Verbal complex  Comprehension Auditory Comprehension Overall Auditory Comprehension: Appears within functional limits for tasks assessed Expression Expression Primary Mode of Expression: Verbal Verbal Expression Overall Verbal Expression: Appears within functional limits for tasks assessed Oral Motor Oral Motor/Sensory Function Overall Oral Motor/Sensory Function: Mild impairment Facial ROM: Reduced left;Suspected CN VII (facial) dysfunction Facial Symmetry: Abnormal symmetry left;Suspected CN VII (facial) dysfunction Facial Strength: Reduced left;Suspected CN VII (facial) dysfunction Facial Sensation: Within Functional Limits Lingual ROM: Reduced left;Suspected CN XII (hypoglossal) dysfunction Lingual Symmetry: Abnormal symmetry left;Suspected CN  XII (hypoglossal) dysfunction Lingual Strength: Reduced;Suspected CN XII (hypoglossal) dysfunction Velum: Within Functional Limits Mandible: Within Functional Limits Motor Speech Overall Motor Speech: Impaired Respiration: Within functional limits Phonation: Low vocal intensity Resonance: Within functional limits Articulation: Impaired Level of Impairment: Conversation Intelligibility: Intelligibility reduced Word: 75-100% accurate Phrase: 75-100% accurate Sentence: 75-100% accurate Conversation: 75-100% accurate Motor Planning: Witnin functional limits Motor Speech Errors: Aware  Care Tool Care Tool Cognition Ability to hear (with hearing aid or hearing appliances if normally used Ability to hear (with hearing aid or hearing appliances if normally used): 0. Adequate - no difficulty in normal conservation, social interaction, listening to TV   Expression of Ideas and Wants Expression of Ideas and Wants: 3. Some difficulty - exhibits some difficulty with expressing needs and ideas (e.g, some words or finishing thoughts) or speech is not clear   Understanding Verbal and Non-Verbal Content Understanding Verbal and Non-Verbal Content: 3. Usually understands - understands most conversations, but misses some part/intent of message. Requires cues at times to understand  Memory/Recall Ability Memory/Recall Ability : Current season;That he or she is in a hospital/hospital unit   PMSV Assessment  PMSV Trial Intelligibility: Intelligibility reduced Word: 75-100% accurate Phrase: 75-100% accurate Sentence: 75-100% accurate Conversation: 75-100% accurate  Bedside Swallowing Assessment General Date of Onset: 04/13/21 Previous Swallow Assessment: BSE and MBS on 2/27 Diet Prior to this Study: Regular;Thin liquids Temperature Spikes Noted: No Respiratory Status: Room air History of Recent Intubation: No Behavior/Cognition: Cooperative;Pleasant mood;Lethargic/Drowsy Oral Cavity -  Dentition: Adequate natural dentition Self-Feeding Abilities: Able to feed self Vision: Functional for self-feeding Patient Positioning: Upright in bed Baseline Vocal Quality: Low vocal intensity Volitional Cough: Strong Volitional Swallow: Able to elicit  Oral Care Assessment Does patient have any of the following "high(er) risk" factors?: None of the above Does patient have any of the following "at risk" factors?: None of the above Patient is LOW RISK: Follow universal precautions (see row information) Ice Chips Ice chips: Not tested Thin Liquid Thin Liquid: Impaired Presentation: Straw Oral Phase Impairments: Reduced labial seal Nectar Thick Nectar Thick Liquid: Not tested Honey Thick Honey Thick Liquid: Not tested Puree Puree: Not tested Solid Solid: Not tested BSE Assessment Risk for Aspiration Impact on safety and function:  Mild aspiration risk  Short Term Goals: Week 1: SLP Short Term Goal 1 (Week 1): Patient will participate in further cognitive-linguistic evaluation SLP Short Term Goal 2 (Week 1): Patient will implement speech intelligibility strategies at the sentence level with min A verbal cues SLP Short Term Goal 3 (Week 1): Patient will consume current diet with implementation of safe swallowing compensatory strategies with sup A verbal cues  Refer to Care Plan for Long Term Goals  Recommendations for other services: None   Discharge Criteria: Patient will be discharged from SLP if patient refuses treatment 3 consecutive times without medical reason, if treatment goals not met, if there is a change in medical status, if patient makes no progress towards goals or if patient is discharged from hospital.  The above assessment, treatment plan, treatment alternatives and goals were discussed and mutually agreed upon: by patient and by family  Patty Sermons 04/18/2021, 5:30 PM

## 2021-04-18 NOTE — Progress Notes (Signed)
Inpatient Rehabilitation Care Coordinator ?Assessment and Plan ?Patient Details  ?Name: Miguel Mccullough ?MRN: [id number] ?Date of Birth: 08/20/1965 ? ?Today's Date: 04/18/2021 ? ?Hospital Problems: Principal Problem: ?  ICH (intracerebral hemorrhage) ([place]) ? ?Past Medical History:  ?Past Medical History:  ?Diagnosis Date  ? COVID-19 2021  ? continues to have increase in suptum production.  ? Hypertension   ? ?Past Surgical History: History reviewed. No pertinent surgical history. ?Social History:  reports that he has never smoked. He has never used smokeless tobacco. He reports that he does not drink alcohol and does not use drugs. ? ?Family / Support Systems ?Marital Status: Married ?Patient Roles: Spouse, Parent, Other (Comment) (self employed) ?Spouse/Significant Other: [name] [phone] ?Children: 26 yo daughter ?Other Supports: Other family members ?Anticipated Caregiver: wife and daughter ?Ability/Limitations of Caregiver: Wife can work from home and daughter is available in the afternoons ?Caregiver Availability: 24/7 ?Family Dynamics: Close knit family who are involved and will provide assist and care to pt. They have extended family who can also assist. Pt feels they have good supports ? 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 ?Abuse/Neglect ?Abuse/Neglect Assessment Can Be Completed: Yes ?Physical Abuse: Denies ?Verbal Abuse: Denies ?Sexual  Abuse: Denies ?Exploitation of patient/patient's resources: Denies ?Self-Neglect: Denies ? ?Patient response to: ?Social Isolation - How often do you feel lonely or isolated from those around you?: Never ? ?Emotional Status ?Pt's affect, behavior and adjustment status: Pt is motivated to do well here and recover from his stroke. He has always been independent and taken care of himself, he is not one to ask for assist from others and hopes to be as independent as possible before leaving here ?Recent Psychosocial Issues: other health issues ?Psychiatric History: No history may benefit from seeing neuro-psych while  here for coping. Will collaborate with team regarding this ?Substance Abuse History: No issues ? ?Patient / Family Perceptions, Expectations & Goals ?Pt/Family understanding of illness & functional limitations: Pt and wife can explain his stroke and deficits, pt is tired from am therapies. Wife is here and observing. Both have spoken with the MD and feel they have a good understanding of his treatment plan moving forward. Want to be updated regarding medical issues ?Premorbid pt/family roles/activities: Husband, father, employee/boss, neighbor, friend, etc ?Anticipated changes in roles/activities/participation: resume ?Pt/family expectations/goals: Pt states: " I hope to do well here and make progress. "  Wife states: " I will do what he needs but know how independent he is and wants to be." ? ?Community Resources ?Community Agencies: None ?Premorbid Home Care/DME Agencies: None ?Transportation available at discharge: Wife, pt did drive prior to admission ?Is the patient able to respond to transportation needs?: Yes ?In the past 12 months, has lack of transportation kept you from medical appointments or from getting medications?: No ?In the past 12 months, has lack of transportation kept you from meetings, work, or from getting things needed for daily living?: No ?Resource referrals recommended:  Neuropsychology ? ?Discharge Planning ?Living Arrangements:  Spouse/significant other, Children ?Support Systems: Spouse/significant other, Children, Other relatives, Friends/neighbors ?Type of Residence: Private residence ?Insurance Resources: Self-pay ?Financial Resources: Employment, Family Support ?Financial Screen Referred: Yes ?Living Expenses: Mortgage ?Money Management: Patient, Spouse ?Does the patient have any problems obtaining your medications?: No (has no PCP was not taking meds) ?Home Management: wife and daughter ?Patient/Family Preliminary Plans: Return home with wife and daughter who are able to provide 24/7 care. Wife can work from home and daughter is available in the afternoons. They plan to be here daily and provide support to pt while here ?Care Coordinator Barriers to Discharge: Insurance for SNF coverage, Medication compliance ?Care Coordinator Anticipated Follow Up Needs: HH/OP ? ?Clinical Impression ?Pleasant gentleman who is motivated to do well and recover from his stroke. His wife is present and observing today in therapies. He has good family support and will have 24/7 care at discharge. Will work on obtaining PCP while here and discharge needs. ? ?Elease Hashimoto ?04/18/2021, 9:55 AM ? ?  ?

## 2021-04-18 NOTE — Progress Notes (Signed)
Manual BP is 184/122. Pt denies pain. On call provider made aware of current blood pressure and of scheduled and prn blood pressure medication already given. Hydralazine 25 mg PO ordered.   ?

## 2021-04-18 NOTE — Evaluation (Signed)
Physical Therapy Assessment and Plan  Patient Details  Name: Miguel Mccullough MRN: [id number] Date of Birth: 1965/12/14  PT Diagnosis: Abnormality of gait, Hemiplegia non-dominant, Hypotonia, and Impaired sensation Rehab Potential: Good ELOS: 21 days   Today's Date: 04/18/2021 PT Individual Time: 1245-1400 PT Individual Time Calculation (min): 75 min    Hospital Problem: Principal Problem:   ICH (intracerebral hemorrhage) ([place])   Past Medical History:  Past Medical History:  Diagnosis Date   COVID-19 2021   continues to have increase in suptum production.   Hypertension    Past Surgical History: History reviewed. No pertinent surgical history.  Assessment & Plan Clinical Impression: Miguel Mccullough is a 56 year old R handed  male with history of HTN but no medications (had stopped meds and not followed up with PCP) who was admitted on 04/13/21 with sudden onset of slurred speech with left-sided numbness followed by weakness and BP noted to be elevated in 270s in route to the hospital.  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  CTA head/neck was negative for aneurysm, dissection, occlusion or significant stenosis.  He was started on Cleviprex for BP control with follow-up CT head showing no change in IPH.  2D echo done showing EF 70 to 75% with severe LVH and grade 2 diastolic dysfunction as well as mild to moderate LA dilatation.   He with question of CKD and had worsening of renal status with follow-up renal ultrasound done 03/02 showing minimal increase in echogenicity.  He also developed significant drop in potassium requiring multiple runs of K as well as oral supplementation.  He was weaned off Cleviprex yesterday and BP medications being titrated for SBP goal < 160.  He was noted to have cognitive deficits with poor safety awareness as well as mild oropharyngeal dysphagia with premature spillage and currently tolerating regular  diet.  Patient with resultant left-sided weakness with numbness, left facial droop with slurred speech as well as mild dysphagia affecting overall functional status.  CIR was recommended due to functional decline.  Patient transferred to CIR on 04/17/2021 .   Patient currently requires max with mobility secondary to muscle paralysis, decreased cardiorespiratoy endurance, impaired timing and sequencing, abnormal tone, and decreased coordination, and decreased midline orientation and decreased attention to left.  Prior to hospitalization, patient was independent  with mobility and lived with Spouse, Family in a House home.  Home access is 5Stairs to enter.  Patient will benefit from skilled PT intervention to maximize safe functional mobility, minimize fall risk, and decrease caregiver burden for planned discharge home with 24 hour assist.  Anticipate patient will benefit from follow up OP at discharge.  PT - End of Session Activity Tolerance: Tolerates 30+ min activity with multiple rests Endurance Deficit: Yes Endurance Deficit Description: Pt w/difficulty keeping eyes open during session, dozes lightly but able to return to activity rapidly, asks "is it normal to be so tired?" PT Assessment Rehab Potential (ACUTE/IP ONLY): Good PT Barriers to Discharge: [place] home environment;Home environment access/layout;Incontinence PT Patient demonstrates impairments in the following area(s): Balance;Endurance;Motor;Perception;Safety;Sensory PT Transfers Functional Problem(s): Bed Mobility;Bed to Chair;Car;Furniture PT Locomotion Functional Problem(s): Ambulation;Wheelchair Mobility;Stairs PT Plan PT Intensity: Minimum of 1-2 x/day ,45 to 90 minutes PT Frequency: 5 out of 7 days PT Duration Estimated Length of Stay: 21 days PT Treatment/Interventions: Ambulation/gait training;Disease management/prevention;Stair training;Visual/perceptual remediation/compensation;Balance/vestibular training;DME/adaptive  equipment instruction;Patient/family education;Therapeutic Activities;Wheelchair propulsion/positioning;Functional electrical stimulation;Psychosocial support;Therapeutic Exercise;Community reintegration;Functional mobility training;UE/LE Strength taining/ROM;Discharge planning;Neuromuscular re-education;Splinting/orthotics;UE/LE Coordination activities PT  Transfers Anticipated Outcome(s): min assist PT Locomotion Anticipated Outcome(s): min assist HHA PT Recommendation Recommendations for Other Services: Therapeutic Recreation consult Therapeutic Recreation Interventions: Other (comment) (TBD by therapist) Patient destination: Home Equipment Recommended: To be determined Equipment Details: has none   PT Evaluation Precautions/Restrictions Precautions Precautions: Fall Precaution Comments: Flaccid Lt hemi, hx scoliosis, HTN Restrictions Weight Bearing Restrictions: No General   Vital SignsTherapy Vitals Temp: 98.3 F (36.8 C) Temp Source: Oral Pulse Rate: 68 Resp: 18 BP: (!) 170/109 Patient Position (if appropriate): Lying Oxygen Therapy SpO2: 99 % O2 Device: Room Air Pain Pain Assessment Pain Scale: 0-10 Pain Score: 0-No pain Pain Interference Pain Interference Pain Effect on Sleep: 1. Rarely or not at all Pain Interference with Therapy Activities: 1. Rarely or not at all Pain Interference with Day-to-Day Activities: 1. Rarely or not at all Home Living/Prior [place] Available Help at Discharge: Available 24 hours/day;Family Type of Home: House Home Access: Stairs to enter [employer] of Steps: 5 Entrance Stairs-Rails: Right;Left Home Layout: Two level;Bed/bath upstairs;1/2 bath on main level Alternate Level Stairs-Number of Steps: 15 Alternate Level Stairs-Rails: Right Bathroom Shower/Tub: Multimedia programmer: Standard Bathroom Accessibility: Yes Additional Comments: Per wife, pt able to reside temporarily on main level upon  d/c home, 1/2 bath on main level is walker accessible with standard toilet (no shower)  Lives With: Spouse;Family Prior Function Level of Independence: Independent with homemaking with ambulation;Independent with basic ADLs;Independent with transfers;Independent with gait  Able to Take Stairs?: Yes Driving: Yes Vocation: Full time employment Vision/Perception  Vision - History Ability to See in Adequate Light: 0 Adequate Vision - Assessment Alignment/Gaze Preference: Gaze right Perception Perception: Impaired Inattention/Neglect: Does not attend to left visual field;Does not attend to left side of body Praxis Praxis: Impaired Praxis Impairment Details: Initiation;Motor planning  Cognition Overall Cognitive Status: Impaired/Different from baseline Arousal/Alertness: Awake/alert Orientation Level: Oriented X4 Year: 2023 Month: March Day of Week: Correct Attention: Sustained Focused Attention: Impaired Sustained Attention: Impaired Immediate Memory Recall: Blue;Bed;Sock Memory Recall Sock: Without Cue Memory Recall Blue: Without Cue Memory Recall Bed: Without Cue Awareness: Impaired Awareness Impairment: Emergent impairment Behaviors: Impulsive Safety/Judgment: Impaired Comments: Due to impulsivity with functional movement, impaired emergent awareness, and Lt inattention Sensation Sensation Light Touch: Impaired by gross assessment (able to detect but cannot specify location, gives gross location) Additional Comments: LUE/LLE Coordination Gross Motor Movements are Fluid and Coordinated: No Fine Motor Movements are Fluid and Coordinated: No Coordination and Movement Description: dense L hemi Finger Nose Finger Test: dense L hemid Heel Shin Test: dense L hemi Motor  Motor Motor: Hemiplegia;Abnormal tone;Abnormal postural alignment and control Motor - Skilled Clinical Observations: L hemi flaccid L UE w/depression of L shoulder   Trunk/Postural Assessment  Cervical  Assessment Cervical Assessment: Exceptions to [facility] (sits and stands w/rotation to R but does have full AROM) Thoracic Assessment Thoracic Assessment: Exceptions to [facility] (L lateral lean, shoulder depression) Lumbar Assessment Lumbar Assessment: Exceptions to [facility] (post tilt) Postural Control Postural Control: Deficits on evaluation (ablsent LUE/LLE, leans L, delayed awareness)  Balance Balance Balance Assessed: Yes Dynamic Sitting Balance Dynamic Sitting - Balance Support: During functional activity Dynamic Sitting - Level of Assistance: 2: Max assist Dynamic Sitting - Balance Activities: Forward lean/weight shifting;Lateral lean/weight shifting Sitting balance - Comments: requires RUE support and feet supported on floor plus visual and verbal cues to attend  to maintain static sitting balance, leans L gradually without awreness/reactions Dynamic Standing Balance Dynamic Standing - Balance Support: During functional activity Dynamic  Standing - Level of Assistance: 2: Max assist Dynamic Standing - Balance Activities: Lateral lean/weight shifting;Forward lean/weight shifting Dynamic Standing - Comments: therapist stabilized LLE, uses mirror for feedback, multimodal cues, leans L heavily Extremity Assessment  RUE Assessment RUE Assessment: Within Functional Limits Active Range of Motion (AROM) Comments: WNL LUE Assessment LUE Assessment: Exceptions to [facility] General Strength Comments: 0/5 LUE Body System: Neuro Brunstrum levels for arm and hand: Arm;Hand Brunstrum level for arm: Stage I Presynergy Brunstrum level for hand: Stage I Flaccidity LUE Tone LUE Tone: Flaccid RLE Assessment RLE Assessment: Within Functional Limits LLE Assessment LLE Assessment: Exceptions to [facility] General Strength Comments: 0/5 LLE Tone LLE Tone: Hypotonic  Care Tool Care Tool Bed Mobility Roll left and right activity   Roll left and right assist level: Moderate Assistance - Patient 50 - 74%    Sit to  lying activity   Sit to lying assist level: Maximal Assistance - Patient 25 - 49%    Lying to sitting on side of bed activity   Lying to sitting on side of bed assist level: the ability to move from lying on the back to sitting on the side of the bed with no back support.: Maximal Assistance - Patient 25 - 49%     Care Tool Transfers Sit to stand transfer   Sit to stand assist level: 2 Helpers    Chair/bed transfer   Chair/bed transfer assist level: 2 Pension scheme manager transfer   Assist Level: 2 Helpers (using Herbalist transfer assist level: Maximal Assistance - Patient 25 - 49%      Care Tool Locomotion Ambulation   Assist level: 2 helpers      Walk 10 feet activity Walk 10 feet activity did not occur: Safety/medical concerns       Walk 50 feet with 2 turns activity        Walk 150 feet activity        Walk 10 feet on uneven surfaces activity        Stairs          Walk up/down 1 step activity        Walk up/down 4 steps activity        Walk up/down 12 steps activity        Pick up small objects from floor        Wheelchair            Wheel 50 feet with 2 turns activity      Wheel 150 feet activity        Refer to Care Plan for Long Term Goals  SHORT TERM GOAL WEEK 1 PT Short Term Goal 1 (Week 1): Pt will perform bed to wc transfers to R w/min assist and light cues, to L w/mod to max assist of 1 PT Short Term Goal 2 (Week 1): Pt will sit statically w/feet supported w/cga PT Short Term Goal 3 (Week 1): Pt will ambulate up to 73f w/LRAD and 2 person assist PT Short Term Goal 4 (Week 1): Pt will propel wc 257fw/R exts and moderate cueing to attend to L environment.  Recommendations for other services: Therapeutic Recreation  Other TBD by primary therapist  Skilled Therapeutic Intervention Evaluation completed (see details above and below) with education on PT POC and goals and individual treatment initiated with  focus on functional mobility/transfers, LE strength, dynamic standing balance/coordination, ambulation, simulated car transfers, and  improved endurance with activity. Therapist provided pt w/[id number] and cushion but would benefit from hemiheight wc when available to allow for improved seat to floor height/ability to use RLE to assist.  Pt educated on role of PT, discussed goals, discussed schedule and conference/DC planning, CVA rehab.  Pt able to participate in sitting and standing in parallel bars using mirrors for feedback for improved midline orientation in each position.  Standing requires therapist to block LLE but pt responds very well to multimodal cueing w/baseline above average body awareness.    At end of session, Pt left oob in wc w/alarm belt set and needs in reach.   Discussed session w/wife who is very supportive.    Mobility Bed Mobility Bed Mobility: Rolling Right;Rolling Left;Supine to Sit Rolling Right: Moderate Assistance - Patient 50-74% Rolling Left: Moderate Assistance - Patient 50-74% Right Sidelying to Sit: Maximal Assistance - Patient 25-49% Supine to Sit: Maximal Assistance - Patient - Patient 25-49% Transfers Transfers: Sit to Stand;Stand to Sit;Squat Pivot Transfers Sit to Stand: Moderate Assistance - Patient 50-74% Stand to Sit: Moderate Assistance - Patient 50-74% Squat Pivot Transfers: Moderate Assistance - Patient 50-74% Transfer (Assistive device): None Transfer via Lift Equipment: Animal nutritionist: Yes Gait Assistance: 2 Holiday representative (Feet): 2 Feet Assistive device: 2 person hand held assist Gait Assistance Details: max assist to stabilize LLE, to advance LLE, heavy L lean, L hip and shoulder retraction, trunk flexion Gait Gait: Yes Gait Pattern: Impaired Gait velocity: impaired severely Stairs / Additional Locomotion Stairs: No Wheelchair Mobility Wheelchair Mobility: Yes Wheelchair Assistance: Maximal Assistance -  Patient 25 - 49% Wheelchair Propulsion: Left upper extremity;Left lower extremity Wheelchair Parts Management: Needs assistance Distance: 50   Discharge Criteria: Patient will be discharged from PT if patient refuses treatment 3 consecutive times without medical reason, if treatment goals not met, if there is a change in medical status, if patient makes no progress towards goals or if patient is discharged from hospital.  The above assessment, treatment plan, treatment alternatives and goals were discussed and mutually agreed upon: by patient  Jerrilyn Cairo 04/18/2021, 4:02 PM

## 2021-04-18 NOTE — Plan of Care (Signed)
?  Problem: RH Swallowing ?Goal: LTG Patient will consume least restrictive diet using compensatory strategies with assistance (SLP) ?Description: LTG:  Patient will consume least restrictive diet using compensatory strategies with assistance (SLP) ?Flowsheets (Taken 04/18/2021 1740) ?LTG: Pt Patient will consume least restrictive diet using compensatory strategies with assistance of (SLP): Modified Independent ?  ?Problem: RH Expression Communication ?Goal: LTG Patient will increase speech intelligibility (SLP) ?Description: LTG: Patient will increase speech intelligibility at word/phrase/conversation level with cues, % of the time (SLP) ?Flowsheets (Taken 04/18/2021 1740) ?LTG: Patient will increase speech intelligibility (SLP): Modified Independent ?  ?

## 2021-04-18 NOTE — Plan of Care (Signed)
?  Problem: RH Balance ?Goal: LTG: Patient will maintain dynamic sitting balance (OT) ?Description: LTG:  Patient will maintain dynamic sitting balance with assistance during activities of daily living (OT) ?Flowsheets (Taken 04/18/2021 1543) ?LTG: Pt will maintain dynamic sitting balance during ADLs with: Contact Guard/Touching assist ?  ?Problem: Sit to Stand ?Goal: LTG:  Patient will perform sit to stand in prep for activites of daily living with assistance level (OT) ?Description: LTG:  Patient will perform sit to stand in prep for activites of daily living with assistance level (OT) ?Flowsheets (Taken 04/18/2021 1543) ?LTG: PT will perform sit to stand in prep for activites of daily living with assistance level: Minimal Assistance - Patient > 75% ?  ?Problem: RH Grooming ?Goal: LTG Patient will perform grooming w/assist,cues/equip (OT) ?Description: LTG: Patient will perform grooming with assist, with/without cues using equipment (OT) ?Flowsheets (Taken 04/18/2021 1543) ?LTG: Pt will perform grooming with assistance level of: Supervision/Verbal cueing ?  ?Problem: RH Bathing ?Goal: LTG Patient will bathe all body parts with assist levels (OT) ?Description: LTG: Patient will bathe all body parts with assist levels (OT) ?Flowsheets (Taken 04/18/2021 1543) ?LTG: Pt will perform bathing with assistance level/cueing: Minimal Assistance - Patient > 75% ?  ?Problem: RH Dressing ?Goal: LTG Patient will perform upper body dressing (OT) ?Description: LTG Patient will perform upper body dressing with assist, with/without cues (OT). ?Flowsheets (Taken 04/18/2021 1543) ?LTG: Pt will perform upper body dressing with assistance level of: Minimal Assistance - Patient > 75% ?Goal: LTG Patient will perform lower body dressing w/assist (OT) ?Description: LTG: Patient will perform lower body dressing with assist, with/without cues in positioning using equipment (OT) ?Flowsheets (Taken 04/18/2021 1543) ?LTG: Pt will perform lower body dressing  with assistance level of: (excluding Teds) ? Minimal Assistance - Patient > 75% ? Other (Comment) ?  ?Problem: RH Toileting ?Goal: LTG Patient will perform toileting task (3/3 steps) with assistance level (OT) ?Description: LTG: Patient will perform toileting task (3/3 steps) with assistance level (OT)  ?Flowsheets (Taken 04/18/2021 1543) ?LTG: Pt will perform toileting task (3/3 steps) with assistance level: Minimal Assistance - Patient > 75% ?  ?Problem: RH Toilet Transfers ?Goal: LTG Patient will perform toilet transfers w/assist (OT) ?Description: LTG: Patient will perform toilet transfers with assist, with/without cues using equipment (OT) ?Flowsheets (Taken 04/18/2021 1543) ?LTG: Pt will perform toilet transfers with assistance level of: Minimal Assistance - Patient > 75% ?  ?

## 2021-04-18 NOTE — Progress Notes (Signed)
Inpatient Rehabilitation Center ?Individual Statement of Services ? ?Patient Name:  Miguel Mccullough  ?Date:  04/18/2021 ? ?Welcome to the [facility].  Our goal is to provide you with an individualized program based on your diagnosis and situation, designed to meet your specific needs.  With this comprehensive rehabilitation program, you will be expected to participate in at least 3 hours of rehabilitation therapies Monday-Friday, with modified therapy programming on the weekends. ? ?Your rehabilitation program will include the following services:  Physical Therapy (PT), Occupational Therapy (OT), Speech Therapy (ST), 24 hour per day rehabilitation nursing, Neuropsychology, Care Coordinator, Rehabilitation Medicine, Nutrition Services, and Pharmacy Services ? ?Weekly team conferences will be held on Tuesday to discuss your progress.  Your Inpatient Rehabilitation Care Coordinator will talk with you frequently to get your input and to update you on team discussions.  Team conferences with you and your family in attendance may also be held. ? ?Expected length of stay: 3-4 weeks  Overall anticipated outcome: min assist level ? ?Depending on your progress and recovery, your program may change. Your Inpatient Rehabilitation Care Coordinator will coordinate services and will keep you informed of any changes. Your Inpatient Rehabilitation Care Coordinator's name and contact numbers are listed  below. ? ?The following services may also be recommended but are not provided by the [facility]:  ?Driving Evaluations ?Home Health Rehabiltiation Services ?Outpatient Rehabilitation Services ?Vocational Rehabilitation ?  ?Arrangements will be made to provide these services after discharge if needed.  Arrangements include referral to agencies that provide these services. ? ?Your insurance has been verified to be:  Self Pay ?Your primary doctor is:  None ? ?Pertinent information will be shared with  your doctor and your insurance company. ? ?Inpatient Rehabilitation Care Coordinator:  Ovidio Kin, [place] or (C) [phone] ? ?Information discussed with and copy given to patient by: Elease Hashimoto, 04/18/2021, 9:57 AM    ?

## 2021-04-18 NOTE — Progress Notes (Signed)
Inpatient Rehabilitation  Patient information reviewed and entered into eRehab system by Darrius Montano M. Kloee Ballew, M.A., CCC/SLP, PPS Coordinator.  Information including medical coding, functional ability and quality indicators will be reviewed and updated through discharge.    

## 2021-04-19 ENCOUNTER — Inpatient Hospital Stay (HOSPITAL_COMMUNITY): Payer: Self-pay

## 2021-04-19 DIAGNOSIS — E876 Hypokalemia: Secondary | ICD-10-CM

## 2021-04-19 DIAGNOSIS — D62 Acute posthemorrhagic anemia: Secondary | ICD-10-CM

## 2021-04-19 DIAGNOSIS — M623 Immobility syndrome (paraplegic): Secondary | ICD-10-CM

## 2021-04-19 DIAGNOSIS — I10 Essential (primary) hypertension: Secondary | ICD-10-CM

## 2021-04-19 MED ORDER — HYDRALAZINE HCL 25 MG PO TABS
25.0000 mg | ORAL_TABLET | Freq: Four times a day (QID) | ORAL | Status: DC | PRN
  Administered 2021-04-19 – 2021-04-21 (×3): 25 mg via ORAL
  Filled 2021-04-19 (×3): qty 1

## 2021-04-19 MED ORDER — METOPROLOL TARTRATE 50 MG PO TABS
75.0000 mg | ORAL_TABLET | Freq: Two times a day (BID) | ORAL | Status: DC
  Administered 2021-04-19 – 2021-05-15 (×53): 75 mg via ORAL
  Filled 2021-04-19 (×53): qty 1

## 2021-04-19 NOTE — Progress Notes (Signed)
Pts BP has continuously ran over 160/100 throughout shift. Pt has been given PRN Hydralazine with no improvement. Pts BP has been taken automatic and manual. Pt denies any headaches, blurred vision, or dizziness. Dr Naaman Plummer notified. No new orders at this time per Dr Naaman Plummer.  ?

## 2021-04-19 NOTE — Progress Notes (Signed)
Speech Language Pathology Daily Session Note ? ?Patient Details  ?Name: Miguel Mccullough ?MRN: [id number] ?Date of Birth: [birth year] ? ?Today's Date: 04/19/2021 ?SLP Individual Time: 4098-2867 ?SLP Individual Time Calculation (min): 30 min ? ?Short Term Goals: ?Week 1: SLP Short Term Goal 1 (Week 1): Patient will participate in further cognitive-linguistic evaluation ?SLP Short Term Goal 1 - Progress (Week 1): Met ?SLP Short Term Goal 2 (Week 1): Patient will implement speech intelligibility strategies at the sentence level with min A verbal cues ?SLP Short Term Goal 3 (Week 1): Patient will consume current diet with implementation of safe swallowing compensatory strategies with sup A verbal cues ?SLP Short Term Goal 4 (Week 1): Pt will sustain attention to tasks for 5-6 min with min A verbal and visual cues for redirection. ?SLP Short Term Goal 5 (Week 1): Pt will recall 3-4 details of relevant information after a delay with min A verbal cues. ?SLP Short Term Goal 6 (Week 1): Pt will complete moderately problem solving tasks with min  A verbal and visual cues ? ?Skilled Therapeutic Interventions: Skilled SLP intervention focused on cognition. Pt completed subtests of cognistat for further cognitive testing. He was lethargic but able to stay awake to complete subtests. He required mod A visual cues for design constructions and verbal problem solving tasks. Mod A needed for generative naming/sustained attention task completed during session. Pt unable to recall if he worked with slp yesterday or any therapy completed today. Decreased accuracy and alertness with tasks by end of session due to pt falling asleep. Cont with therapy per plan of care. Short term goals updated.  ?   ? ?Pain ?Pain Assessment ?Pain Scale: Faces ?Faces Pain Scale: No hurt ? ?Therapy/Group: Individual Therapy ? ?Gregary Signs A Cyra Spader ?04/19/2021, 2:33 PM ?

## 2021-04-19 NOTE — Progress Notes (Signed)
PROGRESS NOTE   Subjective/Complaints:  BP quite elevated again last night. Pt denied any associated symptoms  ROS: Patient denies fever, rash, sore throat, blurred vision, dizziness, nausea, vomiting, diarrhea, cough, shortness of breath or chest pain, joint or back/neck pain, headache, or mood change.     Objective:   VAS [place] LOWER EXTREMITY VENOUS (DVT)  Result Date: 04/19/2021  Lower Venous DVT Study Patient Name:  Miguel Mccullough  Date of Exam:   04/19/2021 Medical Rec #: [id number]      Accession #:    [id number] Date of Birth: August 11, 1965       Patient Gender: M Patient Age:   56 years Exam Location:  [facility] Procedure:      VAS [place] LOWER EXTREMITY VENOUS (DVT) Referring Phys: PAMELA LOVE --------------------------------------------------------------------------------  Indications: Immobility.  Risk Factors: None identified. Comparison Study: No prior studies. Performing Technologist: Oliver Hum RVT  Examination Guidelines: A complete evaluation includes B-mode imaging, spectral Doppler, color Doppler, and power Doppler as needed of all accessible portions of each vessel. Bilateral testing is considered an integral part of a complete examination. Limited examinations for reoccurring indications may be performed as noted. The reflux portion of the exam is performed with the patient in reverse Trendelenburg.  +---------+---------------+---------+-----------+----------+--------------+  RIGHT     Compressibility Phasicity Spontaneity Properties Thrombus Aging  +---------+---------------+---------+-----------+----------+--------------+  CFV       Full            Yes       Yes                                    +---------+---------------+---------+-----------+----------+--------------+  SFJ       Full                                                             +---------+---------------+---------+-----------+----------+--------------+  FV  Prox   Full                                                             +---------+---------------+---------+-----------+----------+--------------+  FV Mid    Full                                                             +---------+---------------+---------+-----------+----------+--------------+  FV Distal Full                                                             +---------+---------------+---------+-----------+----------+--------------+  PFV       Full                                                             +---------+---------------+---------+-----------+----------+--------------+  POP       Full            Yes       Yes                                    +---------+---------------+---------+-----------+----------+--------------+  PTV       Full                                                             +---------+---------------+---------+-----------+----------+--------------+  PERO      Full                                                             +---------+---------------+---------+-----------+----------+--------------+   +---------+---------------+---------+-----------+----------+--------------+  LEFT      Compressibility Phasicity Spontaneity Properties Thrombus Aging  +---------+---------------+---------+-----------+----------+--------------+  CFV       Full            Yes       Yes                                    +---------+---------------+---------+-----------+----------+--------------+  SFJ       Full                                                             +---------+---------------+---------+-----------+----------+--------------+  FV Prox   Full                                                             +---------+---------------+---------+-----------+----------+--------------+  FV Mid    Full                                                             +---------+---------------+---------+-----------+----------+--------------+  FV Distal Full                                                              +---------+---------------+---------+-----------+----------+--------------+  PFV       Full                                                             +---------+---------------+---------+-----------+----------+--------------+  POP       Full            Yes       Yes                                    +---------+---------------+---------+-----------+----------+--------------+  PTV       Full                                                             +---------+---------------+---------+-----------+----------+--------------+  PERO      Full                                                             +---------+---------------+---------+-----------+----------+--------------+     Summary: RIGHT: - There is no evidence of deep vein thrombosis in the lower extremity.  - No cystic structure found in the popliteal fossa.  LEFT: - There is no evidence of deep vein thrombosis in the lower extremity.  - No cystic structure found in the popliteal fossa.  *See table(s) above for measurements and observations.    Preliminary    Recent Labs    04/17/21 0209 04/18/21 0542  WBC 10.5 8.8  HGB 12.6* 12.6*  HCT 36.5* 37.0*  PLT 183 175   Recent Labs    04/17/21 0209 04/18/21 0542  NA 138 136  K 3.2* 3.2*  CL 99 102  CO2 27 27  GLUCOSE 95 98  BUN 28* 27*  CREATININE 2.41* 2.21*  CALCIUM 8.3* 8.3*    Intake/Output Summary (Last 24 hours) at 04/19/2021 1219 Last data filed at 04/19/2021 0700 Gross per 24 hour  Intake 598 ml  Output 800 ml  Net -202 ml        Physical Exam: Vital Signs Blood pressure (!) 181/118, pulse 72, temperature 98 F (36.7 C), temperature source Oral, resp. rate 18, height 5\' 4"  (1.626 m), weight 75.9 kg, SpO2 97 %.   Constitutional: No distress . Vital signs reviewed. HEENT: NCAT, EOMI, oral membranes moist Neck: supple Cardiovascular: RRR without murmur. No JVD    Respiratory/Chest: CTA Bilaterally without wheezes or rales. Normal effort     GI/Abdomen: BS +, non-tender, non-distended Ext: no clubbing, cyanosis, or edema Psych: pleasant and cooperative  Musculoskeletal:     Cervical back: Neck supple. No tenderness.    Skin:    General: Skin is warm and dry.     Comments: B/L forearm IV"s not being used- look OK  Neurological:     Mental Status: He is alert and oriented to person, place, and time.     Comments: Mild left facial weakness  with left inattention and mild left hemifacial sensory loss.  Speech clear and able to follow commands without difficulty. Dense left hemiplegia with sensory deficits. LUE /LLE 0/5 except for trace at foot. RUE and RLE 5/5.       Assessment/Plan: 1. Functional deficits which require 3+ hours per day of interdisciplinary therapy in a comprehensive inpatient rehab setting. Physiatrist is providing close team supervision and 24 hour management of active medical problems listed below. Physiatrist and rehab team continue to assess barriers to discharge/monitor patient progress toward functional and medical goals  Care Tool:  Bathing    Body parts bathed by patient: Chest, Abdomen, Front perineal area, Right upper leg, Left upper leg, Face   Body parts bathed by helper: Right arm, Left arm, Buttocks, Right lower leg, Left lower leg     Bathing assist Assist Level: 2 Helpers (sit<stand in Stedy lift)     Upper Body Dressing/Undressing Upper body dressing   What is the patient wearing?: Pull over shirt    Upper body assist Assist Level: Maximal Assistance - Patient 25 - 49%    Lower Body Dressing/Undressing Lower body dressing      What is the patient wearing?: Incontinence brief, Pants     Lower body assist Assist for lower body dressing: 2 Helpers     Toileting Toileting Toileting Activity did not occur Landscape architect and hygiene only): N/A (no void or bm)  Toileting assist Assist for toileting: 2 Helpers     Transfers Chair/bed transfer  Transfers assist      Chair/bed transfer assist level: 2 Helpers     Locomotion Ambulation   Ambulation assist      Assist level: 2 helpers Assistive device: Hand held assist Max distance: 2 ft   Walk 10 feet activity   Assist  Walk 10 feet activity did not occur: Safety/medical concerns        Walk 50 feet activity   Assist Walk 50 feet with 2 turns activity did not occur: Safety/medical concerns         Walk 150 feet activity   Assist Walk 150 feet activity did not occur: Safety/medical concerns         Walk 10 feet on uneven surface  activity   Assist Walk 10 feet on uneven surfaces activity did not occur: Safety/medical concerns         Wheelchair     Assist Is the patient using a wheelchair?: Yes Type of Wheelchair: Manual    Wheelchair assist level: Total Assistance - Patient < 25% Max wheelchair distance: 5    Wheelchair 50 feet with 2 turns activity    Assist        Assist Level: Dependent - Patient 0%   Wheelchair 150 feet activity     Assist      Assist Level: Dependent - Patient 0%   Blood pressure (!) 181/118, pulse 72, temperature 98 F (36.7 C), temperature source Oral, resp. rate 18, height 5\' 4"  (1.626 m), weight 75.9 kg, SpO2 97 %.  Medical Problem List and Plan: 1. 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  Antithrombotics: -DVT/anticoagulation:  lovenox  -dopplers reviewed and negative             -  antiplatelet therapy: N/A due to bleed.  3. Pain Management:: On tylenol as needed 4. Mood: LCSW to follow for evaluation an dsup             -antipsychotic agents: N/A 5. Neuropsych: This patient is capable of making decisions on his own behalf. 6. Skin/Wound Care: Routine pressure relief measures.  7. Fluids/Electrolytes/Nutrition:  Monitor I/O. Check CMET in am.  8. HTN: goal to keep SBP< 160  -bp remains poorly controlled             -- Catapress 2 mg TID, metoprolol 50 mg BID and Norvasc 10 md/d.  3/4- increase hydralazine to 25mg  q6 prn,    -metoprolol increased to 75mg  bid   -continue same catapres and norvasc 9. Persistent hypokalemia: Start Kdur 20 meq daily. Check Mg level  3/3- Will replete 40 mEq x2 and recheck Monday- last Mg on 2/28 was 2.3 10.  Severe LVH: Has been referred to cardiology for work up. Continue metoprolol bid.  11. 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  --Renal ultrasound with mild evidence of medical renal disease             --encourage fluid intake. 3/3- down slightly to 2.21- will avoid nephrotoxic agents  12, Elevated AST: recheck in am. 13. Hyperlipidemia: Continue Lipitor.  14. ABLA: Likely due to bleed with drop from 14.4-->12.6.              --monitor for signs of bleeding. Recheck CBC in am.  15. L neglect and lethargy-pt appears more alert  -monitor for need of stimulant 16. Noncompliance- hadn't taken BP med sin months per pt.    .      LOS: 2 days A FACE TO FACE EVALUATION WAS PERFORMED  Meredith Staggers 04/19/2021, 12:19 PM

## 2021-04-19 NOTE — Progress Notes (Signed)
Occupational Therapy Session Note ? ?Patient Details  ?Name: Miguel Mccullough ?MRN: [id number] ?Date of Birth: 26-Apr-1965 ? ?Today's Date: 04/19/2021 ?OT Individual Time: 2505-3976 ?OT Individual Time Calculation (min): 68 min  ? ? ?Short Term Goals: ?Week 1:  OT Short Term Goal 1 (Week 1): Pt will thread Lt UE into shirt with Mod A and vcs to improve Lt attention ?OT Short Term Goal 2 (Week 1): Pt will verbalize 2 joint protection strategies to use during bed mobility and/or transfers to improve Lt attention ?OT Short Term Goal 3 (Week 1): Education will be provided to pts wife in regards to 2 hemi positioning strategies to implement at home for caregiver education ? ?Skilled Therapeutic Interventions/Progress Updates:  ?+++Check with Nursing to be sure BP is not too high.   Monitor BP during sessions.++  At end of OT session this afternoon nurse techn noted BP was high.  Diastolic =734.      She alerted RN was came in with concern and assess.  ed even though he had just completed therapy.   ? ?  Focus this session = postural control EOB= Min to Mod A during dynamic sitting.  Patient able to use HOH with L UE to wash and use deodorant for R arm pit.    Decreased sensation and proprioception noted through L UE.   Patient with R eye gaze and rquired tactile or verbal prompts to attend to left environment through out session.    Patient able to complete hemidressing techqniues sittting EOB  with technique education and moderate assistance (CGA to Min to maintain dynamic sitting balance).   Patient tolerated well unsupported sitting balance and L UE neur reeducation, weight bearing and proprioceptive skilled treatment.   He was able to complete sit tostand at stedy with Min A at end of session to transfer from bed to recliner chair (legs were elevated and safety alarm engaged).    Wife missed session as she walked her mother out of the hospital when OT arrived for session. ? ?At end of session patient was left as referenced  above with safety alarm engaged in recliner. And call bell near.   Also nursing was present attending to his elevated BP. ? ?Continue OT Plan of care. ? ?   ? ?Therapy Documentation ?Precautions:  ?Check with Nursing to be sure BP is not too high.   Monitor BP during sessions ?Precautions ?Precautions: Fall ?Precaution Comments: Flaccid Lt hemi, hx scoliosis, HTN ?Restrictions ?Weight Bearing Restrictions: No ?General: ?General ?PT Missed Treatment Reason: Patient fatigue ? ?Vital Signs: ?At end of session BP was high.  RN concerned even though he had just completed therapy.  Diastolic =193 ? ?Pain: ?Pain Assessment ?Pain Scale: Faces ?Faces Pain Scale: No hurt ? ? ?  ? ? ?Therapy/Group: Individual Therapy ? ?Herschell Dimes ?04/19/2021, 6:15 PM ?

## 2021-04-19 NOTE — Progress Notes (Signed)
Physical Therapy Session Note ? ?Patient Details  ?Name: Miguel Mccullough ?MRN: [id number] ?Date of Birth: 05/14/1965 ? ?Today's Date: 04/19/2021 ?PT Missed Time: 30 Minutes ?Missed Time Reason: Patient fatigue ? ?Short Term Goals: ?Week 1:  PT Short Term Goal 1 (Week 1): Pt will perform bed to wc transfers to R w/min assist and light cues, to L w/mod to max assist of 1 ?PT Short Term Goal 2 (Week 1): Pt will sit statically w/feet supported w/cga ?PT Short Term Goal 3 (Week 1): Pt will ambulate up to 83ft w/LRAD and 2 person assist ?PT Short Term Goal 4 (Week 1): Pt will propel wc 52ft w/R exts and moderate cueing to attend to L environment. ? ?Skilled Therapeutic Interventions/Progress Updates:  ?  Pt asleep and snoring on arrival and not easily roused. Consulted with pt's nurse re: BP issues and fatigue. D/t pt's previous level of fatigue, therapist left pt to rest and will attempt to make up time at later date. Pt missed 30 min of scheduled time d/t sleeping.  ? ?Therapy Documentation ?Precautions:  ?Precautions ?Precautions: Fall ?Precaution Comments: Flaccid Lt hemi, hx scoliosis, HTN ?Restrictions ?Weight Bearing Restrictions: No ?General: ?PT Amount of Missed Time (min): 30 Minutes ?PT Missed Treatment Reason: Patient fatigue ? ? ?Therapy/Group: Individual Therapy ? ?[place] ?04/19/2021, 3:56 PM  ?

## 2021-04-19 NOTE — Progress Notes (Signed)
Physical Therapy Session Note ? ?Patient Details  ?Name: Miguel Mccullough ?MRN: [id number] ?Date of Birth: [birth year] ? ?Today's Date: 04/19/2021 ?PT Individual Time: 9476-5465 ?PT Individual Time Calculation (min): 50 min  and Today's Date: 04/19/2021 ?PT Missed Time: 10 Minutes ?Missed Time Reason: Patient fatigue ? ?Short Term Goals: ?Week 1:  PT Short Term Goal 1 (Week 1): Pt will perform bed to wc transfers to R w/min assist and light cues, to L w/mod to max assist of 1 ?PT Short Term Goal 2 (Week 1): Pt will sit statically w/feet supported w/cga ?PT Short Term Goal 3 (Week 1): Pt will ambulate up to 53ft w/LRAD and 2 person assist ?PT Short Term Goal 4 (Week 1): Pt will propel wc 14ft w/R exts and moderate cueing to attend to L environment. ? ?Skilled Therapeutic Interventions/Progress Updates:  ?  Pt received in recliner and agreeable to therapy.  No complaint of pain. Donned shoes with tot a for time management. Squat pivot<>recliner toward both sides with mod A, for LLE knee block and hip elevation.  ? ?Pt transported to therapy gym for time management and energy conservation. Session focused on NMR in // bars to improve standing mechanics and work toward gait. Pt performed Sit to stand with mod A throughout. Gait belt around hips to facilitate hip extension, L knee block, and assist/multimodal cueing for trunk posture. Pt able to partially stabilize trunk but fatigues quickly, with trunk rotation and flexion. Pt also performed weight shift, pre gait stepping, and mini squats with similar level assist. Rest breaks to manage fatigue, discussed POC and using gait and neuromuscular facilitation techniques during rehab stay.  ? ?At this time pt became fatigued and could not continue, so returned to room. Squat pivot as documented above. Pt positioned in recliner for comfort and was left with all needs in reach and alarm active. Pt missed 10 min scheduled therapy d/t fatigue.  ? ?Therapy Documentation ?Precautions:   ?Precautions ?Precautions: Fall ?Precaution Comments: Flaccid Lt hemi, hx scoliosis, HTN ?Restrictions ?Weight Bearing Restrictions: No ?General: ?PT Amount of Missed Time (min): 10 Minutes ?PT Missed Treatment Reason: Patient fatigue ? ? ? ?Therapy/Group: Individual Therapy ? ?[place] ?04/19/2021, 9:49 AM  ?

## 2021-04-19 NOTE — Progress Notes (Signed)
Recheck blood pressure in a few hours, no further blood pressure medications to be added unless DBP is higher than the last reading(184/122) per Dr. Naaman Plummer.  ?

## 2021-04-19 NOTE — Progress Notes (Signed)
Bilateral lower extremity venous duplex has been completed. ?Preliminary results can be found in CV Proc through chart review.  ? ?04/19/21 12:03 PM ?Carlos Levering RVT   ?

## 2021-04-20 MED ORDER — HYDRALAZINE HCL 50 MG PO TABS
50.0000 mg | ORAL_TABLET | Freq: Three times a day (TID) | ORAL | Status: DC
  Administered 2021-04-20 – 2021-04-24 (×12): 50 mg via ORAL
  Filled 2021-04-20 (×12): qty 1

## 2021-04-20 NOTE — IPOC Note (Signed)
Overall Plan of Care (IPOC) ?Patient Details ?Name: Miguel Mccullough ?MRN: [id number] ?DOB: [birth year] ? ?Admitting Diagnosis: ICH (intracerebral hemorrhage) ([place]) ? ?Hospital Problems: Principal Problem: ?  ICH (intracerebral hemorrhage) ([place]) ? ? ? ? Functional Problem List: ?Nursing Endurance, Medication Management, Motor, Perception, Safety  ?PT Balance, Endurance, Motor, Perception, Safety, Sensory  ?OT Balance, Perception, Safety, Behavior, Endurance, Motor  ?SLP Cognition  ?TR    ?    ? Basic ADL?s: ?OT Grooming, Bathing, Dressing, Toileting  ? ?  Advanced  ADL?s: ?OT Simple Meal Preparation  ?   ?Transfers: ?PT Bed Mobility, Bed to Chair, Car, Furniture  ?OT Toilet, Tub/Shower  ? ?  Locomotion: ?PT Ambulation, Wheelchair Mobility, Stairs  ? ?  Additional Impairments: ?OT Fuctional Use of Upper Extremity  ?SLP Communication, Swallowing, Social Cognition ?expression ?Attention (to be further assessed)  ?TR    ? ? ?Anticipated Outcomes ?Item Anticipated Outcome  ?Self Feeding no goal  ?Swallowing ? mod I ?  ?Basic self-care ? Supervision-Min A  ?Toileting ? Min A ?  ?Bathroom Transfers Min A  ?Bowel/Bladder ? n/a  ?Transfers ? min assist  ?Locomotion ? min assist HHA  ?Communication ? mod I-to-sup A  ?Cognition ? sup A  ?Pain ? n/a  ?Safety/Judgment ? min assist  ? ?Therapy Plan: ?PT Intensity: Minimum of 1-2 x/day ,45 to 90 minutes ?PT Frequency: 5 out of 7 days ?PT Duration Estimated Length of Stay: 21 days ?OT Intensity: Minimum of 1-2 x/day, 45 to 90 minutes ?OT Frequency: 5 out of 7 days ?OT Duration/Estimated Length of Stay: 3 to 4 weeks ?SLP Intensity: Minumum of 1-2 x/day, 30 to 90 minutes ?SLP Frequency: 3 to 5 out of 7 days ?SLP Duration/Estimated Length of Stay: 3 weeks  ? ?Due to the current state of emergency, patients may not be receiving their 3-hours of Medicare-mandated therapy. ? ? Team Interventions: ?Nursing Interventions Patient/Family Education, Disease Management/Prevention, Medication  Management, Discharge Planning  ?PT interventions Ambulation/gait training, Disease management/prevention, Stair training, Visual/perceptual remediation/compensation, Training and development officer, Engineer, drilling, Patient/family education, Therapeutic Activities, Wheelchair propulsion/positioning, Functional electrical stimulation, Psychosocial support, Therapeutic Exercise, Community reintegration, Functional mobility training, UE/LE Strength taining/ROM, Discharge planning, Neuromuscular re-education, Splinting/orthotics, UE/LE Coordination activities  ?OT Interventions Balance/vestibular training, Functional electrical stimulation, Discharge planning, Pain management, Self Care/advanced ADL retraining, Therapeutic Activities, UE/LE Coordination activities, Therapeutic Exercise, Patient/family education, Functional mobility training, Disease mangement/prevention, Cognitive remediation/compensation, Community reintegration, DME/adaptive equipment instruction, Neuromuscular re-education, Psychosocial support, Splinting/orthotics, UE/LE Strength taining/ROM, Wheelchair propulsion/positioning  ?SLP Interventions Patient/family education, Dysphagia/aspiration precaution training, Cognitive remediation/compensation, Speech/Language facilitation, Functional tasks, Therapeutic Activities  ?TR Interventions    ?SW/CM Interventions Discharge Planning, Psychosocial Support, Patient/Family Education  ? ?Barriers to Discharge ?MD  Medical stability  ?Nursing Decreased caregiver support, Home environment access/layout, Lack of/limited family support, Medication compliance, Other (comments) (Uninsured) ?Uninsured, no PCP. 2 level, 5 steps, left rail. 15 steps 2nd level, bed/bath upstairs, 1/2 bath main level. Spouse can work from home. Daughter additional assist.  ?PT Inaccessible home environment, Home environment access/layout, Incontinence ?   ?OT Home environment access/layout ?   ?SLP   ?   ?SW Insurance  for SNF coverage, Medication compliance ?   ? ?Team Discharge Planning: ?Destination: PT-Home ,OT- Home , SLP-Home ?Projected Follow-up: PT- , OT-  Home health OT, SLP-Outpatient SLP, Home Health SLP ?Projected Equipment Needs: PT-To be determined, OT- To be determined, SLP-None recommended by SLP ?Equipment Details: PT-has none, OT-  ?Patient/family involved in discharge planning: PT- Patient, Family member/caregiver,  OT-Family member/caregiver, Patient, SLP-Patient, Family member/caregiver ? 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 The team will be addressing functional mobility, strength, stamina, balance, safety, adaptive techniques and equipment, self-care, bowel and bladder mgt, patient and caregiver education, NMR, communication, cognition, pain control, community reentry. Goals have been set at min assist for self-care and mobility and supervision to mod I for cognition. Anticipated discharge destination is home with family. ? ?Due to the current state of emergency, patients may not be receiving their 3 hours per day of Medicare-mandated therapy.  ? ? ?Meredith Staggers, MD, FAAPMR   ? ? ?See Team Conference Notes for weekly updates to the plan of care  ?

## 2021-04-20 NOTE — Progress Notes (Signed)
Dr. Naaman Plummer made aware of patient's blood pressure trend tonight. No orders received. Will let the patient rest tonight and check BP in the morning per MD. ?

## 2021-04-20 NOTE — Progress Notes (Signed)
PROGRESS NOTE   Subjective/Complaints:  Had a reasonable night. Tolerated therapies without any issues. Had a mild headache yesterday for an hour or so.   ROS: Patient denies fever, rash, sore throat, blurred vision, dizziness, nausea, vomiting, diarrhea, cough, shortness of breath or chest pain, joint or back/neck pain,  or mood change.     Objective:   VAS [place] LOWER EXTREMITY VENOUS (DVT)  Result Date: 04/19/2021  Lower Venous DVT Study Patient Name:  Miguel Mccullough  Date of Exam:   04/19/2021 Medical Rec #: [id number]      Accession #:    [id number] Date of Birth: November 01, 1965       Patient Gender: M Patient Age:   56 years Exam Location:  [facility] Procedure:      VAS [place] LOWER EXTREMITY VENOUS (DVT) Referring Phys: PAMELA LOVE --------------------------------------------------------------------------------  Indications: Immobility.  Risk Factors: None identified. Comparison Study: No prior studies. Performing Technologist: Oliver Hum RVT  Examination Guidelines: A complete evaluation includes B-mode imaging, spectral Doppler, color Doppler, and power Doppler as needed of all accessible portions of each vessel. Bilateral testing is considered an integral part of a complete examination. Limited examinations for reoccurring indications may be performed as noted. The reflux portion of the exam is performed with the patient in reverse Trendelenburg.  +---------+---------------+---------+-----------+----------+--------------+  RIGHT     Compressibility Phasicity Spontaneity Properties Thrombus Aging  +---------+---------------+---------+-----------+----------+--------------+  CFV       Full            Yes       Yes                                    +---------+---------------+---------+-----------+----------+--------------+  SFJ       Full                                                              +---------+---------------+---------+-----------+----------+--------------+  FV Prox   Full                                                             +---------+---------------+---------+-----------+----------+--------------+  FV Mid    Full                                                             +---------+---------------+---------+-----------+----------+--------------+  FV Distal Full                                                             +---------+---------------+---------+-----------+----------+--------------+  PFV       Full                                                             +---------+---------------+---------+-----------+----------+--------------+  POP       Full            Yes       Yes                                    +---------+---------------+---------+-----------+----------+--------------+  PTV       Full                                                             +---------+---------------+---------+-----------+----------+--------------+  PERO      Full                                                             +---------+---------------+---------+-----------+----------+--------------+   +---------+---------------+---------+-----------+----------+--------------+  LEFT      Compressibility Phasicity Spontaneity Properties Thrombus Aging  +---------+---------------+---------+-----------+----------+--------------+  CFV       Full            Yes       Yes                                    +---------+---------------+---------+-----------+----------+--------------+  SFJ       Full                                                             +---------+---------------+---------+-----------+----------+--------------+  FV Prox   Full                                                             +---------+---------------+---------+-----------+----------+--------------+  FV Mid    Full                                                              +---------+---------------+---------+-----------+----------+--------------+  FV Distal Full                                                             +---------+---------------+---------+-----------+----------+--------------+  PFV       Full                                                             +---------+---------------+---------+-----------+----------+--------------+  POP       Full            Yes       Yes                                    +---------+---------------+---------+-----------+----------+--------------+  PTV       Full                                                             +---------+---------------+---------+-----------+----------+--------------+  PERO      Full                                                             +---------+---------------+---------+-----------+----------+--------------+     Summary: RIGHT: - There is no evidence of deep vein thrombosis in the lower extremity.  - No cystic structure found in the popliteal fossa.  LEFT: - There is no evidence of deep vein thrombosis in the lower extremity.  - No cystic structure found in the popliteal fossa.  *See table(s) above for measurements and observations.    Preliminary    Recent Labs    04/18/21 0542  WBC 8.8  HGB 12.6*  HCT 37.0*  PLT 175   Recent Labs    04/18/21 0542  NA 136  K 3.2*  CL 102  CO2 27  GLUCOSE 98  BUN 27*  CREATININE 2.21*  CALCIUM 8.3*    Intake/Output Summary (Last 24 hours) at 04/20/2021 0913 Last data filed at 04/20/2021 0802 Gross per 24 hour  Intake 440 ml  Output --  Net 440 ml        Physical Exam: Vital Signs Blood pressure (!) 160/109, pulse 62, temperature 98.6 F (37 C), temperature source Oral, resp. rate 16, height 5\' 4"  (1.626 m), weight 75.9 kg, SpO2 98 %.  Constitutional: No distress . Vital signs reviewed. HEENT: NCAT, EOMI, oral membranes moist Neck: supple Cardiovascular: RRR without murmur. No JVD    Respiratory/Chest: CTA Bilaterally without  wheezes or rales. Normal effort    GI/Abdomen: BS +, non-tender, non-distended Ext: no clubbing, cyanosis, or edema Psych: pleasant and cooperative  Musculoskeletal:     Cervical back: Neck supple. No tenderness.    Skin:    General: Skin is warm and dry.  Neurological:     Mental Status: He is alert and oriented to person, place, and time.     Comments: Mild left facial weakness with left inattention and mild left hemifacial sensory loss.  Speech clear and able to follow commands without difficulty. Dense left hemiplegia with sensory deficits. LUE /LLE 0/5 except for trace  at foot. RUE and RLE 5/5. No motor/sensory changes today      Assessment/Plan: 1. Functional deficits which require 3+ hours per day of interdisciplinary therapy in a comprehensive inpatient rehab setting. Physiatrist is providing close team supervision and 24 hour management of active medical problems listed below. Physiatrist and rehab team continue to assess barriers to discharge/monitor patient progress toward functional and medical goals  Care Tool:  Bathing    Body parts bathed by patient: Chest, Abdomen, Front perineal area, Right upper leg, Left upper leg, Face   Body parts bathed by helper: Right arm, Left arm, Buttocks, Right lower leg, Left lower leg     Bathing assist Assist Level: 2 Helpers (sit<stand in Stedy lift)     Upper Body Dressing/Undressing Upper body dressing   What is the patient wearing?: Pull over shirt    Upper body assist Assist Level: Maximal Assistance - Patient 25 - 49%    Lower Body Dressing/Undressing Lower body dressing      What is the patient wearing?: Incontinence brief, Pants     Lower body assist Assist for lower body dressing: 2 Helpers     Toileting Toileting Toileting Activity did not occur Landscape architect and hygiene only): N/A (no void or bm)  Toileting assist Assist for toileting: 2 Helpers     Transfers Chair/bed transfer  Transfers  assist     Chair/bed transfer assist level: 2 Helpers     Locomotion Ambulation   Ambulation assist      Assist level: 2 helpers Assistive device: Hand held assist Max distance: 2 ft   Walk 10 feet activity   Assist  Walk 10 feet activity did not occur: Safety/medical concerns        Walk 50 feet activity   Assist Walk 50 feet with 2 turns activity did not occur: Safety/medical concerns         Walk 150 feet activity   Assist Walk 150 feet activity did not occur: Safety/medical concerns         Walk 10 feet on uneven surface  activity   Assist Walk 10 feet on uneven surfaces activity did not occur: Safety/medical concerns         Wheelchair     Assist Is the patient using a wheelchair?: Yes Type of Wheelchair: Manual    Wheelchair assist level: Total Assistance - Patient < 25% Max wheelchair distance: 5    Wheelchair 50 feet with 2 turns activity    Assist        Assist Level: Dependent - Patient 0%   Wheelchair 150 feet activity     Assist      Assist Level: Dependent - Patient 0%   Blood pressure (!) 160/109, pulse 62, temperature 98.6 F (37 C), temperature source Oral, resp. rate 16, height 5\' 4"  (1.626 m), weight 75.9 kg, SpO2 98 %.  Medical Problem List and Plan: 1. 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  Antithrombotics: -DVT/anticoagulation:  lovenox  -dopplers reviewed and negative             -antiplatelet therapy: N/A due to bleed.  3. Pain Management:: On tylenol as needed 4. Mood: LCSW  to follow for evaluation an dsup             -antipsychotic agents: N/A 5. Neuropsych: This patient is capable of making decisions on his own behalf. 6. Skin/Wound Care: Routine pressure relief measures.  7.  Fluids/Electrolytes/Nutrition: Monitor I/O. Check CMET in am.  8. HTN: goal to keep SBP< 160  -3/5 bp remains poorly controlled             -- Catapress 2 mg TID, metoprolol increased to 75 mg BID and Norvasc 10 qd.   -will begin scheduled hydralazine 50mg  q8  -will use addnl hydralazine prn    9. Persistent hypokalemia: Start Kdur 20 meq daily. Check Mg level  3/3- Will replete 40 mEq x2 and recheck Monday- last Mg on 2/28 was 2.3 10.  Severe LVH: Has been referred to cardiology for work up. Continue metoprolol bid.  11. 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  --Renal ultrasound with mild evidence of medical renal disease             --encourage fluid intake. 3/3- down slightly to 2.21- will avoid nephrotoxic agents 3/5 f/u renal fxn Monday  12, Elevated AST: recheck in am. 13. Hyperlipidemia: Continue Lipitor.  14. ABLA: Likely due to bleed with drop from 14.4-->12.6.              --monitor for signs of bleeding. Recheck CBC Monday  15. L neglect and lethargy-pt appears more alert  -monitor for need of stimulant 16. Noncompliance- hadn't taken BP med sin months per pt.    .      LOS: 3 days A FACE TO FACE EVALUATION WAS PERFORMED  Meredith Staggers 04/20/2021, 9:13 AM

## 2021-04-21 LAB — CBC
HCT: 39.8 % (ref 39.0–52.0)
Hemoglobin: 13.7 g/dL (ref 13.0–17.0)
MCH: 28.8 pg (ref 26.0–34.0)
MCHC: 34.4 g/dL (ref 30.0–36.0)
MCV: 83.6 fL (ref 80.0–100.0)
Platelets: 236 10*3/uL (ref 150–400)
RBC: 4.76 MIL/uL (ref 4.22–5.81)
RDW: 12.7 % (ref 11.5–15.5)
WBC: 9.5 10*3/uL (ref 4.0–10.5)
nRBC: 0 % (ref 0.0–0.2)

## 2021-04-21 LAB — BASIC METABOLIC PANEL
Anion gap: 9 (ref 5–15)
BUN: 28 mg/dL — ABNORMAL HIGH (ref 6–20)
CO2: 23 mmol/L (ref 22–32)
Calcium: 8.9 mg/dL (ref 8.9–10.3)
Chloride: 106 mmol/L (ref 98–111)
Creatinine, Ser: 1.9 mg/dL — ABNORMAL HIGH (ref 0.61–1.24)
GFR, Estimated: 42 mL/min — ABNORMAL LOW (ref >60)
Glucose, Bld: 104 mg/dL — ABNORMAL HIGH (ref 70–99)
Potassium: 3.6 mmol/L (ref 3.5–5.1)
Sodium: 138 mmol/L (ref 135–145)

## 2021-04-21 MED ORDER — TRAZODONE HCL 50 MG PO TABS
75.0000 mg | ORAL_TABLET | Freq: Every day | ORAL | Status: DC
  Administered 2021-04-21 – 2021-05-14 (×24): 75 mg via ORAL
  Filled 2021-04-21 (×23): qty 2
  Filled 2021-04-21: qty 1.5
  Filled 2021-04-21: qty 2

## 2021-04-21 MED ORDER — AMANTADINE HCL 100 MG PO CAPS
100.0000 mg | ORAL_CAPSULE | Freq: Every day | ORAL | Status: DC
  Administered 2021-04-21 – 2021-04-29 (×9): 100 mg via ORAL
  Filled 2021-04-21 (×9): qty 1

## 2021-04-21 NOTE — Progress Notes (Signed)
Physical Therapy Session Note ? ?Patient Details  ?Name: Miguel Mccullough ?MRN: [id number] ?Date of Birth: Oct 12, 1965 ? ?Today's Date: 04/21/2021 ?PT Individual Time: 1305-1400 ?PT Individual Time Calculation (min): 55 min  ? ?Short Term Goals: ?Week 1:  PT Short Term Goal 1 (Week 1): Pt will perform bed to wc transfers to R w/min assist and light cues, to L w/mod to max assist of 1 ?PT Short Term Goal 2 (Week 1): Pt will sit statically w/feet supported w/cga ?PT Short Term Goal 3 (Week 1): Pt will ambulate up to 72ft w/LRAD and 2 person assist ?PT Short Term Goal 4 (Week 1): Pt will propel wc 104ft w/R exts and moderate cueing to attend to L environment. ? ?Skilled Therapeutic Interventions/Progress Updates:  ?  Pt received in recliner and agreeable to therapy.  No complaint of pain. Pt performed squat pivot recliner>EOB>w/c with mod A toward L side. Pt transported to therapy gym for time management and energy conservation. With +2 assist from pt's daughter for w/c follow, pt ambulated 2 x 15 ft and 3 x 40 ft with seated rest breaks, max A to advance LLE, min A Sit to stand to block knee. Began family education on squat pivot with pt's daughter who observed transfer. Pt then sat on mat table with assist from his daughter while therapist adjusted w/c to hemi height. Transported back to room and then handed off to NT for toileting at end of session. Pt's wife with several questions about home set up and ramp vs stair navigation, advised that they look into options as they might need a ramp at d/c.  ? ?Therapy Documentation ?Precautions:  ?Precautions ?Precautions: Fall ?Precaution Comments: Flaccid Lt hemi, hx scoliosis, HTN ?Restrictions ?Weight Bearing Restrictions: No ?General: ?  ?Vital Signs: ? ? ? ?Therapy/Group: Individual Therapy ? ?Sar[place] ?04/21/2021, 4:05 PM  ?

## 2021-04-21 NOTE — Progress Notes (Signed)
Occupational Therapy Session Note ? ?Patient Details  ?Name: Miguel Mccullough ?MRN: [id number] ?Date of Birth: Jul 09, 1965 ? ?Today's Date: 04/21/2021 ?OT Individual Time: [phone] ?OT Individual Time Calculation (min): 39 min  ? ? ?Short Term Goals: ?Week 1:  OT Short Term Goal 1 (Week 1): Pt will thread Lt UE into shirt with Mod A and vcs to improve Lt attention ?OT Short Term Goal 2 (Week 1): Pt will verbalize 2 joint protection strategies to use during bed mobility and/or transfers to improve Lt attention ?OT Short Term Goal 3 (Week 1): Education will be provided to pts wife in regards to 2 hemi positioning strategies to implement at home for caregiver education ? ? ?Skilled Therapeutic Interventions/Progress Updates:  ?  Pt greeted at time of session sitting up in wheelchair with wife present who did not remain for session. No pain reported. Spoke with RN prior to session who said BP was stable this morning and good for this pt. Pt wanting to brush teeth at beginning of session, set up with sink and items needed on L side, pt having to attend to L side for toothbrush, rinse cup, toothpaste, etc and able to utilize one handed techniques with Min A overall. Pt transported to 4th floor ortho gym and set up on SCIFIT level 1 no added resistance and ACE wrap on L hand to prevent sliding off, leaning FWD for no posterior support and performed forward rotation for 5:30 with one rest break. OT assisting at scapula for mobilization, pt with good effort during this task and staying awake. BP checked and 157/99 after activity. Note other times pt starting to fall asleep throughout session but easily woken. Back in room set up with LUE elevated, alarm on call bell in reach.  ? ?Therapy Documentation ?Precautions:  ?Precautions ?Precautions: Fall ?Precaution Comments: Flaccid Lt hemi, hx scoliosis, HTN ?Restrictions ?Weight Bearing Restrictions: No ? ? ? ? ?Therapy/Group: Individual Therapy ? ?[name] ?04/21/2021, 7:20 AM ?

## 2021-04-21 NOTE — Progress Notes (Signed)
PROGRESS NOTE   Subjective/Complaints:  Pt reports LBM 2 days ago- no constipation feeling.  More restless/anxious at night- sleeps a lot during day but not as much at night.   Up every couple of hours.  Explained it's because he's sleeping so much during day.  But sleepy a lot during day still too- so will try adding Amantadine.    ROS:  Pt denies SOB, abd pain, CP, N/V/C/D, and vision changes     Objective:   VAS [place] LOWER EXTREMITY VENOUS (DVT)  Result Date: 04/20/2021  Lower Venous DVT Study Patient Name:  Miguel Mccullough  Date of Exam:   04/19/2021 Medical Rec #: [id number]      Accession #:    [id number] Date of Birth: [birth year]       Patient Gender: M Patient Age:   56 years Exam Location:  [facility] [place] Procedure:      VAS [place] LOWER EXTREMITY VENOUS (DVT) Referring Phys: PAMELA LOVE --------------------------------------------------------------------------------  Indications: Immobility.  Risk Factors: None identified. Comparison Study: No prior studies. Performing Technologist: Oliver Hum RVT  Examination Guidelines: A complete evaluation includes B-mode imaging, spectral Doppler, color Doppler, and power Doppler as needed of all accessible portions of each vessel. Bilateral testing is considered an integral part of a complete examination. Limited examinations for reoccurring indications may be performed as noted. The reflux portion of the exam is performed with the patient in reverse Trendelenburg.  +---------+---------------+---------+-----------+----------+--------------+  RIGHT     Compressibility Phasicity Spontaneity Properties Thrombus Aging  +---------+---------------+---------+-----------+----------+--------------+  CFV       Full            Yes       Yes                                    +---------+---------------+---------+-----------+----------+--------------+  SFJ       Full                                                              +---------+---------------+---------+-----------+----------+--------------+  FV Prox   Full                                                             +---------+---------------+---------+-----------+----------+--------------+  FV Mid    Full                                                             +---------+---------------+---------+-----------+----------+--------------+  FV Distal Full                                                             +---------+---------------+---------+-----------+----------+--------------+  PFV       Full                                                             +---------+---------------+---------+-----------+----------+--------------+  POP       Full            Yes       Yes                                    +---------+---------------+---------+-----------+----------+--------------+  PTV       Full                                                             +---------+---------------+---------+-----------+----------+--------------+  PERO      Full                                                             +---------+---------------+---------+-----------+----------+--------------+   +---------+---------------+---------+-----------+----------+--------------+  LEFT      Compressibility Phasicity Spontaneity Properties Thrombus Aging  +---------+---------------+---------+-----------+----------+--------------+  CFV       Full            Yes       Yes                                    +---------+---------------+---------+-----------+----------+--------------+  SFJ       Full                                                             +---------+---------------+---------+-----------+----------+--------------+  FV Prox   Full                                                             +---------+---------------+---------+-----------+----------+--------------+  FV Mid    Full                                                              +---------+---------------+---------+-----------+----------+--------------+  FV Distal Full                                                             +---------+---------------+---------+-----------+----------+--------------+  PFV       Full                                                             +---------+---------------+---------+-----------+----------+--------------+  POP       Full            Yes       Yes                                    +---------+---------------+---------+-----------+----------+--------------+  PTV       Full                                                             +---------+---------------+---------+-----------+----------+--------------+  PERO      Full                                                             +---------+---------------+---------+-----------+----------+--------------+     Summary: RIGHT: - There is no evidence of deep vein thrombosis in the lower extremity.  - No cystic structure found in the popliteal fossa.  LEFT: - There is no evidence of deep vein thrombosis in the lower extremity.  - No cystic structure found in the popliteal fossa.  *See table(s) above for measurements and observations. Electronically signed by Orlie Pollen on 04/20/2021 at 10:12:37 AM.    Final    Recent Labs    04/21/21 0603  WBC 9.5  HGB 13.7  HCT 39.8  PLT 236   Recent Labs    04/21/21 0603  NA 138  K 3.6  CL 106  CO2 23  GLUCOSE 104*  BUN 28*  CREATININE 1.90*  CALCIUM 8.9    Intake/Output Summary (Last 24 hours) at 04/21/2021 0831 Last data filed at 04/21/2021 0600 Gross per 24 hour  Intake 297 ml  Output 1500 ml  Net -1203 ml        Physical Exam: Vital Signs Blood pressure (!) 165/98, pulse 78, temperature 98.4 F (36.9 C), temperature source Oral, resp. rate 16, height 5\' 4"  (1.626 m), weight 75.9 kg, SpO2 99 %.   General: awake, alert, appropriate, but fell asleep again; wife at bedside;  NAD HENT: R gaze preference CV: regular rate; no  JVD Pulmonary: CTA B/L; no W/R/R- good air movement GI: soft, NT, ND, (+)BS Psychiatric: appropriate; but very flat Neurological: alert when awake, but then fell back asleep-  Ext: no clubbing, cyanosis, or edema Psych: pleasant and cooperative  Musculoskeletal:     Cervical back: Neck supple. No tenderness.    Skin:    General: Skin is warm and dry.  Neurological:     Mental Status: He is alert and oriented to person, place, and time.     Comments: Mild left facial weakness with left inattention and mild left hemifacial sensory loss.  Speech clear and able to follow commands without difficulty. Dense left hemiplegia with sensory deficits. LUE /LLE 0/5 except for trace at foot. RUE and RLE 5/5. No motor/sensory changes today      Assessment/Plan: 1. Functional deficits which require 3+ hours per day of interdisciplinary therapy in a comprehensive inpatient rehab setting. Physiatrist is providing close team supervision and 24 hour management of active medical problems listed below. Physiatrist and rehab team continue to assess barriers to discharge/monitor patient progress toward functional and medical goals  Care Tool:  Bathing    Body parts bathed by patient: Chest, Abdomen, Front perineal area, Right upper leg, Left upper leg, Face   Body parts bathed by helper: Right arm, Left arm, Buttocks, Right lower leg, Left lower leg     Bathing assist Assist Level: 2 Helpers (sit<stand in [place] lift)     Upper Body Dressing/Undressing Upper body dressing   What is the patient wearing?: Pull over shirt    Upper body assist Assist Level: Maximal Assistance - Patient 25 - 49%    Lower Body Dressing/Undressing Lower body dressing      What is the patient wearing?: Incontinence brief, Pants     Lower body assist Assist for lower body dressing: 2 Helpers     Toileting Toileting Toileting Activity did not occur Landscape architect and hygiene only): N/A (no void or bm)   Toileting assist Assist for toileting: 2 Helpers     Transfers Chair/bed transfer  Transfers assist     Chair/bed transfer assist level: 2 Helpers     Locomotion Ambulation   Ambulation assist      Assist level: 2 helpers Assistive device: Hand held assist Max distance: 2 ft   Walk 10 feet activity   Assist  Walk 10 feet activity did not occur: Safety/medical concerns        Walk 50 feet activity   Assist Walk 50 feet with 2 turns activity did not occur: Safety/medical concerns         Walk 150 feet activity   Assist Walk 150 feet activity did not occur: Safety/medical concerns         Walk 10 feet on uneven surface  activity   Assist Walk 10 feet on uneven surfaces activity did not occur: Safety/medical concerns         Wheelchair     Assist Is the patient using a wheelchair?: Yes Type of Wheelchair: Manual    Wheelchair assist level: Total Assistance - Patient < 25% Max wheelchair distance: 5    Wheelchair 50 feet with 2 turns activity    Assist        Assist Level: Dependent - Patient 0%   Wheelchair 150 feet activity     Assist      Assist Level: Dependent - Patient 0%   Blood pressure (!) 165/98, pulse 78, temperature 98.4 F (36.9 C), temperature source Oral, resp. rate 16, height 5\' 4"  (1.626 m), weight 75.9 kg, SpO2 99 %.  Medical Problem List and Plan: 1. 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  Antithrombotics: -DVT/anticoagulation:  lovenox  -dopplers reviewed and negative             -  antiplatelet therapy: N/A due to bleed.  3. Pain Management:: On tylenol as needed 4. Mood: LCSW to follow for evaluation an dsup             -antipsychotic agents: N/A 5. Neuropsych: This patient is capable of making decisions on his own behalf. 6.  Skin/Wound Care: Routine pressure relief measures.  7. Fluids/Electrolytes/Nutrition: Monitor I/O. Check CMET in am.  8. HTN: goal to keep SBP< 160  -3/5 bp remains poorly controlled             -- Catapress 2 mg TID, metoprolol increased to 75 mg BID and Norvasc 10 qd.   -will begin scheduled hydralazine 50mg  q8  -will use addnl hydralazine prn   3/6- just added hydralazine- BP 165/98 this AM- will monitor and follow trend   9. Persistent hypokalemia: Start Kdur 20 meq daily. Check Mg level  3/3- Will replete 40 mEq x2 and recheck Monday- last Mg on 2/28 was 2.3  3/6- K+ up to 3.6- con't to monitor 10.  Severe LVH: Has been referred to cardiology for work up. Continue metoprolol bid.  11. 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  --Renal ultrasound with mild evidence of medical renal disease             --encourage fluid intake. 3/3- down slightly to 2.21- will avoid nephrotoxic agents 3/5 f/u renal fxn Monday 3/6- renal u/s today- Cr down to 1.90 and BUN 28  12, Elevated AST: recheck in am. 13. Hyperlipidemia: Continue Lipitor.  14. ABLA: Likely due to bleed with drop from 14.4-->12.6.              --monitor for signs of bleeding. Recheck CBC Monday   3/6- Hb up to 13.7- doing better even with improvement in BUN/Cr.  15. L neglect and lethargy-pt appears more alert  -monitor for need of stimulant 16. Noncompliance- hadn't taken BP meds in months per pt.  17/ Sedation during day with insomnia at night  3/6- will add trazodone 75 mg QHS and add Amantadine for wakefulness during day.    I spent a total of 38   minutes on total care today- >50% coordination of care- due to d/w wife and nursing about plan with BP, sleep issues    .      LOS: 4 days A FACE TO FACE EVALUATION WAS PERFORMED  Satya Buttram 04/21/2021, 8:31 AM

## 2021-04-21 NOTE — Progress Notes (Signed)
Physical Therapy Session Note ? ?Patient Details  ?Name: Miguel Mccullough ?MRN: [id number] ?Date of Birth: 07/07/1965 ? ?Today's Date: 04/21/2021 ?PT Individual Time: 0630-1601 ?PT Individual Time Calculation (min): 26 min  ? ?Short Term Goals: ?Week 1:  PT Short Term Goal 1 (Week 1): Pt will perform bed to wc transfers to R w/min assist and light cues, to L w/mod to max assist of 1 ?PT Short Term Goal 2 (Week 1): Pt will sit statically w/feet supported w/cga ?PT Short Term Goal 3 (Week 1): Pt will ambulate up to 78ft w/LRAD and 2 person assist ?PT Short Term Goal 4 (Week 1): Pt will propel wc 66ft w/R exts and moderate cueing to attend to L environment. ? ?Skilled Therapeutic Interventions/Progress Updates:  ?   ?Pt received supine in bed and agrees to therapy. No complaint of pain. Pt's BP assessed at 165/95. No symptoms and per RN, BP coming down from earlier in morning. Pt performs supine to sit with cues for hand placement and logrolling, requiring modA overall to complete. Pt has L lateral bias and drift in sitting, but is able to correct with consistent verbal cueing and assistance with body mechanics. Pt perform sit to stand x3 reps from edge of bed with modA and blocking of L knee, with cues for posture, hip extension, and weight shifting. Pt performs stand step transfer to [facility] with modA and cues for weight shifting and positioning, as well as increased eccentric control of stand to sit. Left seated in WC with alarm intact and all needs within reach. ? ?Therapy Documentation ?Precautions:  ?Precautions ?Precautions: Fall ?Precaution Comments: Flaccid Lt hemi, hx scoliosis, HTN ?Restrictions ?Weight Bearing Restrictions: No ? ? ? ?Therapy/Group: Individual Therapy ? ?Breck Coons, PT, DPT ?04/21/2021, 12:51 PM  ?

## 2021-04-21 NOTE — Progress Notes (Signed)
Speech Language Pathology Daily Session Note ? ?Patient Details  ?Name: Miguel Mccullough ?MRN: [id number] ?Date of Birth: November 03, 1965 ? ?Today's Date: 04/21/2021 ?SLP Individual Time: 1028-1100 ?SLP Individual Time Calculation (min): 32 min and Today's Date: 04/21/2021 ?SLP Missed Time: 13 Minutes ?Missed Time Reason: Toileting ? ?Short Term Goals: ?Week 1: SLP Short Term Goal 1 (Week 1): Patient will participate in further cognitive-linguistic evaluation ?SLP Short Term Goal 1 - Progress (Week 1): Met ?SLP Short Term Goal 2 (Week 1): Patient will implement speech intelligibility strategies at the sentence level with min A verbal cues ?SLP Short Term Goal 3 (Week 1): Patient will consume current diet with implementation of safe swallowing compensatory strategies with sup A verbal cues ?SLP Short Term Goal 4 (Week 1): Pt will sustain attention to tasks for 5-6 min with min A verbal and visual cues for redirection. ?SLP Short Term Goal 5 (Week 1): Pt will recall 3-4 details of relevant information after a delay with min A verbal cues. ?SLP Short Term Goal 6 (Week 1): Pt will complete moderately problem solving tasks with min  A verbal and visual cues ? ?Skilled Therapeutic Interventions: Skilled ST treatment focused on cognitive and speech goals. Pt was alert and in good spirits. SLP facilitated session by providing education on speech intelligibility strategies using SLOP acronym (slow, loud, over articulate, pause). Patient participated in a verbal expression task at the sentence level with sup A verbal cues for use of strategies to reduce speech rate and over-articulate. Pt perceived as >90% intelligible at the conversation level. SLP sustained attention for duration of session with minimal redirection and adequate sustained and selective attention. SLP facilitated visual scanning task by identifying small-to-medium size pictures within large field with sup A cues. Pt identified pertinent information on weather forecast and  answered related questions with 95% accuracy with sup A cues. Pt completed basic calendar organization task with min A cues for organization and sup A for problem solving. Pt demonstrated good self monitoring and independently double checked his work for accuracy. 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 Spouse has been present for supervision. Patient was left in wheelchair with alarm activated and immediate needs within reach at end of session. Continue per current plan of care.   ?   ?Pain ?Pain Assessment ?Pain Scale: 0-10 ?Pain Score: 0-No pain ? ?Therapy/Group: Individual Therapy ? ?Kayven Aldaco T Mabrey Howland ?04/21/2021, 12:34 PM ?

## 2021-04-22 MED ORDER — SORBITOL 70 % SOLN: 30.0000 mL | Freq: Once | Status: DC

## 2021-04-22 NOTE — Progress Notes (Addendum)
?                                                       PROGRESS NOTE ? ? ?Subjective/Complaints: ? ?Pt reports and wife agrees no difference in sedation yesterday with amantadine yesterday- but did sleep much better overnight- with increase in trazodone.  ?LBM 3 daysa go.  ?BP better per wife- less checks overnight.  ?Appetite also better.  ? ?ROS:  ? ?Pt denies SOB, abd pain, CP, N/V/C/D, and vision changes ? ? ? ? ? ?Objective: ?  ?No results found. ?Recent Labs  ?  04/21/21 ?0603  ?WBC 9.5  ?HGB 13.7  ?HCT 39.8  ?PLT 236  ? ?Recent Labs  ?  04/21/21 ?0603  ?NA 138  ?K 3.6  ?CL 106  ?CO2 23  ?GLUCOSE 104*  ?BUN 28*  ?CREATININE 1.90*  ?CALCIUM 8.9  ? ? ?Intake/Output Summary (Last 24 hours) at 04/22/2021 0844 ?Last data filed at 04/22/2021 0800 ?Gross per 24 hour  ?Intake 580 ml  ?Output --  ?Net 580 ml  ?  ? ?  ? ?Physical Exam: ?Vital Signs ?Blood pressure (!) 155/96, pulse 63, temperature 98.9 ?F (37.2 ?C), temperature source Oral, resp. rate 18, height 5\' 4"  (1.626 m), weight 75.9 kg, SpO2 98 %. ? ? ? ?General: awake, alert, appropriate,glasses on ; wife at bedside;  NAD ?HENT: R gaze preference; oropharynx moist ?CV: regular rate; no JVD ?Pulmonary: CTA B/L; no W/R/R- good air movement ?GI: soft overall, NT, somewhat distended; firmer; hyperactive ?Psychiatric: appropriate ?Neurological: alert- less sleepy to me- didn't fall asleep in exam today  ?Ext: no clubbing, cyanosis, or edema ?Psych: pleasant and cooperative  ?Musculoskeletal:  ?   Cervical back: Neck supple. No tenderness.  ?  ?Skin: ?   General: Skin is warm and dry.  ?Neurological:  ?   Mental Status: He is alert and oriented to person, place, and time.  ?   Comments: Mild left facial weakness with left inattention and mild left hemifacial sensory loss.  Speech clear and able to follow commands without difficulty. Dense left hemiplegia with sensory deficits. LUE /LLE 0/5 except for trace at foot. RUE and RLE 5/5. No motor/sensory changes today ?  ?   ? ?Assessment/Plan: ?1. Functional deficits which require 3+ hours per day of interdisciplinary therapy in a comprehensive inpatient rehab setting. ?Physiatrist is providing close team supervision and 24 hour management of active medical problems listed below. ?Physiatrist and rehab team continue to assess barriers to discharge/monitor patient progress toward functional and medical goals ? ?Care Tool: ? ?Bathing ?   ?Body parts bathed by patient: Chest, Abdomen, Front perineal area, Right upper leg, Left upper leg, Face  ? Body parts bathed by helper: Right arm, Left arm, Buttocks, Right lower leg, Left lower leg ?  ?  ?Bathing assist Assist Level: 2 Helpers (sit<stand in [place] lift) ?  ?  ?Upper Body Dressing/Undressing ?Upper body dressing   ?What is the patient wearing?: Pull over shirt ?   ?Upper body assist Assist Level: Maximal Assistance - Patient 25 - 49% ?   ?Lower Body Dressing/Undressing ?Lower body dressing ? ? ?   ?What is the patient wearing?: Incontinence brief, Pants ? ?  ? ?Lower body assist Assist for lower body dressing: 2 Helpers ?   ? ?Toileting ?  Toileting Toileting Activity did not occur (Probation officer and hygiene only): N/A (no void or bm)  ?Toileting assist Assist for toileting: 2 Helpers ?  ?  ?Transfers ?Chair/bed transfer ? ?Transfers assist ?   ? ?Chair/bed transfer assist level: 2 Helpers ?  ?  ?Locomotion ?Ambulation ? ? ?Ambulation assist ? ?   ? ?Assist level: 2 helpers ?Assistive device: Hand held assist ?Max distance: 2 ft  ? ?Walk 10 feet activity ? ? ?Assist ? Walk 10 feet activity did not occur: Safety/medical concerns ? ?  ?   ? ?Walk 50 feet activity ? ? ?Assist Walk 50 feet with 2 turns activity did not occur: Safety/medical concerns ? ?  ?   ? ? ?Walk 150 feet activity ? ? ?Assist Walk 150 feet activity did not occur: Safety/medical concerns ? ?  ?  ?  ? ?Walk 10 feet on uneven surface  ?activity ? ? ?Assist Walk 10 feet on uneven surfaces activity did not occur:  Safety/medical concerns ? ? ?  ?   ? ?Wheelchair ? ? ? ? ?Assist Is the patient using a wheelchair?: Yes ?Type of Wheelchair: Manual ?  ? ?Wheelchair assist level: Total Assistance - Patient < 25% ?Max wheelchair distance: 5  ? ? ?Wheelchair 50 feet with 2 turns activity ? ? ? ?Assist ? ?  ?  ? ? ?Assist Level: Total Assistance - Patient < 25%  ? ?Wheelchair 150 feet activity  ? ? ? ?Assist ?   ? ? ?Assist Level: Total Assistance - Patient < 25%  ? ?Blood pressure (!) 155/96, pulse 63, temperature 98.9 ?F (37.2 ?C), temperature source Oral, resp. rate 18, height 5\' 4"  (1.626 m), weight 75.9 kg, SpO2 98 %. ? ?Medical Problem List and Plan: ?1. 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            -patient may  shower ?            -ELOS/Goals: 14-20 days- min A to supervision ? Team conference today to determine length of stay-  ? Con't CIR- PT, OT and SLP ?2.  Antithrombotics: ?-DVT/anticoagulation:  lovenox ? -dopplers reviewed and negative ?            -antiplatelet therapy: N/A due to bleed.  ?3. Pain Management:: On tylenol as needed ?4. Mood: LCSW to follow for evaluation an dsup ?            -antipsychotic agents: N/A ?5. Neuropsych: This patient is capable of making decisions on his own behalf. ?6. Skin/Wound Care: Routine pressure relief measures.  ?7. Fluids/Electrolytes/Nutrition: Monitor I/O. Check CMET in am.  ?8. HTN: goal to keep SBP< 160 ? -3/5 bp remains poorly controlled ?            -- Catapress 2 mg TID, metoprolol increased to 75 mg BID and Norvasc 10 qd. ?  -will begin scheduled hydralazine 50mg  q8 ? -will use addnl hydralazine prn  ? 3/6- just added hydralazine- BP 165/98 this AM- will monitor and follow trend  ?3/7- BP 155/96- coming down- will con't and change tomorrow if still need to.   ?9. Persistent hypokalemia: Start Kdur 20 meq daily. Check Mg level ? 3/3- Will replete 40 mEq x2 and recheck Monday- last Mg on 2/28 was 2.3 ? 3/6- K+ up to 3.6-  con't to monitor ?10.  Severe LVH: Has been referred to cardiology for work up. Continue metoprolol bid.  ?  11. 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  ?--Renal ultrasound with mild evidence of medical renal disease ?            --encourage fluid intake. ?3/3- down slightly to 2.21- will avoid nephrotoxic agents ?3/5 f/u renal fxn Monday ?3/6- renal u/s today- Cr down to 1.90 and BUN 28  ?3/7- will recheck Thursday ?12, Elevated AST: recheck in am. ?13. Hyperlipidemia: Continue Lipitor.  ?14. ABLA: Likely due to bleed with drop from 14.4-->12.6.  ?            --monitor for signs of bleeding. Recheck CBC Monday  ? 3/6- Hb up to 13.7- doing better even with improvement in BUN/Cr.  ?15. L neglect and lethargy-pt appears more alert ? -monitor for need of stimulant ?16. Noncompliance- hadn't taken BP meds in months per pt.  ?17/ Sedation during day with insomnia at night ? 3/6- will add trazodone 75 mg QHS and add Amantadine for wakefulness during day.  ? 3/7- slept better last night- might increase amantadine in 2-3 days.  ?18. Constipation ? 3/7- LBM 3 days ago- will give sorbitol if no BM by 3pm.  ? ?I spent a total of 43   minutes on total care today- >50% coordination of care- due to d/w wife and pt about amantadine- also team conference ? ? ?  ? ?LOS: ?5 days ?A FACE TO FACE EVALUATION WAS PERFORMED ? ?Miguel Mccullough ?04/22/2021, 8:44 AM  ? ? ? ?

## 2021-04-22 NOTE — Progress Notes (Addendum)
Patient ID: Miguel Mccullough, male   DOB: 20-Mar-1965, 56 y.o.   MRN: [id number] Met with pt and wife who was present in his room to update them regarding team conference goals of overall min assist level and target discharge at [place] 3/30. Wife wanting to know if will e able to do steps due to they have five steps into their home. She is addressing with Olivia-PT. Both pleased with a date and the goals set for him. Pt is hoping he will exceed these goals. Have set up PCP for him at discharge through [facility] ?

## 2021-04-22 NOTE — Plan of Care (Signed)
?  Problem: RH Problem Solving ?Goal: LTG Patient will demonstrate problem solving for (SLP) ?Description: LTG:  Patient will demonstrate problem solving for basic/complex daily situations with cues  (SLP) ?Flowsheets (Taken 04/22/2021 0755) ?LTG: Patient will demonstrate problem solving for (SLP): Complex daily situations ?LTG Patient will demonstrate problem solving for: Supervision ?  ?Problem: RH Memory ?Goal: LTG Patient will use memory compensatory aids to (SLP) ?Description: LTG:  Patient will use memory compensatory aids to recall biographical/new, daily complex information with cues (SLP) ?Flowsheets (Taken 04/22/2021 0755) ?LTG: Patient will use memory compensatory aids to (SLP): Modified Independent ?  ?Problem: RH Attention ?Goal: LTG Patient will demonstrate this level of attention during functional activites (SLP) ?Description: LTG:  Patient will will demonstrate this level of attention during functional activites (SLP) ?Flowsheets (Taken 04/22/2021 0755) ?Patient will demonstrate during cognitive/linguistic activities the attention type of: Alternating ?LTG: Patient will demonstrate this level of attention during cognitive/linguistic activities with assistance of (SLP): Supervision ?  ?

## 2021-04-22 NOTE — Progress Notes (Signed)
Speech Language Pathology Daily Session Note ? ?Patient Details  ?Name: Miguel Mccullough ?MRN: [id number] ?Date of Birth: [birth year] ? ?Today's Date: 04/22/2021 ?SLP Individual Time: 1430-1500 ?SLP Individual Time Calculation (min): 30 min ? ?Short Term Goals: ?Week 1: SLP Short Term Goal 1 (Week 1): Patient will participate in further cognitive-linguistic evaluation ?SLP Short Term Goal 1 - Progress (Week 1): Met ?SLP Short Term Goal 2 (Week 1): Patient will implement speech intelligibility strategies at the sentence level with min A verbal cues ?SLP Short Term Goal 3 (Week 1): Patient will consume current diet with implementation of safe swallowing compensatory strategies with sup A verbal cues ?SLP Short Term Goal 4 (Week 1): Pt will sustain attention to tasks for 5-6 min with min A verbal and visual cues for redirection. ?SLP Short Term Goal 5 (Week 1): Pt will recall 3-4 details of relevant information after a delay with min A verbal cues. ?SLP Short Term Goal 6 (Week 1): Pt will complete moderately problem solving tasks with min  A verbal and visual cues ? ?Skilled Therapeutic Interventions:Skilled ST services focused on cognitive skills. Pt was asleep upon entering room and fell asleep a few times during the session, pt was impacted by fatigue in today's session. SLP facilitated complex problem solving, recall and error awareness skills in account balancing task. Pt initially required max A fade to mod A verbal cues for error awareness with calculator. Taught, thinking aloud strategy was helpful in reducing errors. Pt was left in room with call bell within reach and chair alarm set. SLP recommends to continue skilled services. ?   ? ?Pain ?Pain Assessment ?Pain Score: 0-No pain ? ?Therapy/Group: Individual Therapy ? ?Kavan Devan ?04/22/2021, 4:54 PM ?

## 2021-04-22 NOTE — Progress Notes (Signed)
Occupational Therapy Session Note ? ?Patient Details  ?Name: Miguel Mccullough ?MRN: [id number] ?Date of Birth: 1965/11/19 ? ?Today's Date: 04/22/2021 ?OT Individual Time: 4268-3419 ?OT Individual Time Calculation (min): 54 min  ? ? ?Short Term Goals: ?Week 1:  OT Short Term Goal 1 (Week 1): Pt will thread Lt UE into shirt with Mod A and vcs to improve Lt attention ?OT Short Term Goal 2 (Week 1): Pt will verbalize 2 joint protection strategies to use during bed mobility and/or transfers to improve Lt attention ?OT Short Term Goal 3 (Week 1): Education will be provided to pts wife in regards to 2 hemi positioning strategies to implement at home for caregiver education ? ? ?Skilled Therapeutic Interventions/Progress Updates:  ?  Pt greeted at time of session sitting up in recliner with family present finishing eating lunch, given a few minutes at beginning of session to finish eating. No c/o pain. Discussion with family regarding ramp and stairs given their concerns and recommended ultimately to speak with PT and will be addressed closer to DC date. Pt performing squat pivot recliner > wheelchair toward R side with MOD A and performing in same manner throughout session toward both directions. Pt transported to 4th floor ortho gym and set up on SCIFIT level 2, ACE wrap on L hand and pt performed for 3 minute warm up while OT retrieved 1/2 lap tray for LUE. Pt and family educated on purpose and use. Squat pivot wheelchair <> mat with MOD A and focused on core strength, decreasing L lean while performing multiple weight shifting, lateral leans, ball rolls, and weight bearing all to provide input and improve body awareness. Pt transported back to room and set up alarm onc all bell in reach.  ? ?Therapy Documentation ?Precautions:  ?Precautions ?Precautions: Fall ?Precaution Comments: Flaccid Lt hemi, hx scoliosis, HTN ?Restrictions ?Weight Bearing Restrictions: No ? ? ? ?Therapy/Group: Individual Therapy ? ?[name] ?04/22/2021, 12:57 PM ?

## 2021-04-22 NOTE — Progress Notes (Signed)
Physical Therapy Session Note ? ?Patient Details  ?Name: Miguel Mccullough ?MRN: [id number] ?Date of Birth: 10/27/1965 ? ?Today's Date: 04/22/2021 ?PT Individual Time: 0900-1000, 5789-7847 ?PT Individual Time Calculation (min): 60 min, 39 min   ? ?Short Term Goals: ?Week 1:  PT Short Term Goal 1 (Week 1): Pt will perform bed to wc transfers to R w/min assist and light cues, to L w/mod to max assist of 1 ?PT Short Term Goal 2 (Week 1): Pt will sit statically w/feet supported w/cga ?PT Short Term Goal 3 (Week 1): Pt will ambulate up to 69ft w/LRAD and 2 person assist ?PT Short Term Goal 4 (Week 1): Pt will propel wc 41ft w/R exts and moderate cueing to attend to L environment. ? ?Skilled Therapeutic Interventions/Progress Updates:  ?  Session 1: ?Pt received in recliner and agreeable to therapy.  No complaint of pain. Squat pivot recliner>bed>w/c with mod A toward L side. Pt transported to therapy gym for time management and energy conservation. Pt ambulated with hall rail 3 x 50 ft with max A for LLE advancement and stance, and trunk control. Transitioned to //bars for standing activity. Started with static standing, giving high 5's over PT's shoulder, reaching slightly outside BOS. Therapist provided knee block and facilitated hip extension. Min A Sit to stand in bars throughout. With the same set up, pt moved clips on mirror in front of him for improved trunk control without UE support. At this time, pt noted difficulty with glute activation and hip extension. Transitioned to mat table with mod A squat pivot and CGA sit<>supine. During supine>sit, noted tricep activation with weightbearing on table. Pt then performed bridges 3 x 6 with tapping facilitation for improved glute activation in standing. During rest breaks, pt performed self directed stretching for tight legs and hips, therapist provided assist with stretching LLE. Pt also performed lower trunk rotations for tightness in low back and hips, with pt reporting  improvement. Pt returned to room and to recliner in same manner as above, was left with all needs in reach and alarm active.  ? ?Session 2: ?Pt seated in w/c on arrival and agreeable to therapy. No complaint of pain. Pt transported to [employer]	[place] bridge for time. Session focused on gait training in lite gait for improved postural stability from external support. Pt ambulated 2 x ~100 ft with lite gait and tot A for swing and stance control. Pt also ambulated ~ 10 ft backward in the same manner. Pt requested to return to recliner after session, mod A squat pivot. Pt positioned therapeutically with pillows and was left with all needs in reach and alarm active.  ? ?Therapy Documentation ?Precautions:  ?Precautions ?Precautions: Fall ?Precaution Comments: Flaccid Lt hemi, hx scoliosis, HTN ?Restrictions ?Weight Bearing Restrictions: No ?General: ?  ? ? ? ? ?Therapy/Group: Individual Therapy and Co-Treatment ? ?[place] ?04/22/2021, 3:51 PM  ?

## 2021-04-22 NOTE — Patient Care Conference (Signed)
Inpatient RehabilitationTeam Conference and Plan of Care Update ?Date: 04/22/2021   Time: 11:50 AM  ? ? ?Patient Name: Miguel Mccullough      ?Medical Record Number: [id number]  ?Date of Birth: [birth year] ?Sex: Male         ?Room/Bed: 5C07C/5C07C-01 ?Payor Info: Payor: /   ? ?Admit Date/Time:  04/17/2021  5:15 PM ? ?Primary Diagnosis:  ICH (intracerebral hemorrhage) ([place]) ? ?Hospital Problems: Principal Problem: ?  ICH (intracerebral hemorrhage) ([facility]) ? ? ? ?Expected Discharge Date: Expected Discharge Date: 05/15/21 ? ?Team Members Present: ?Physician leading conference: Dr. Courtney Heys ?Social Worker Present: Ovidio Kin, LCSW ?Nurse Present: Dorthula Nettles, RN ?PT Present: Ailene Rud, PT ?OT Present: Lillia Corporal, OT ?PPS Coordinator present : Gunnar Fusi, SLP ? ?   Current Status/Progress Goal Weekly Team Focus  ?Bowel/Bladder ? ? Cont. of B/B, lbm 04/18/21.  Remain cont. of B/B  Assess qshift and PRN   ?Swallow/Nutrition/ Hydration ? ? reg diet/thin liquids, sup A  mod I  monitoring pocketing, anterior spillage, safe swallowing precautions/strategies   ?ADL's ? ? based on [employer]: bathing/dressing from stedy, 2 helpers, stedy transfers thus far, LUE flaccid  Min A overall  sit > stands, standing balance/tolerance, squat pivot transfers, ADL retraining and hemitechniques, family ed/training   ?Mobility ? ? mod A squat pivot, +2 gait 40 ft, stedy transfers min A  min A, supervision w/c  gait, transfers, w/c mobility   ?Communication ? ? sup A for speech intelligibility, impacted by fatigue  mod I  speech intelligiblity strategies at the conversation level   ?Safety/Cognition/ Behavioral Observations ? sup-to-min A when alert  Mod I - Supervision A  alternating attention, functional recall with compensatory aids, complex problem solving   ?Pain ? ? Denies any pain or discomfort  Remain free of pain  Assess qshift and PRN   ?Skin ? ? No skin impairments  Remain free from skin impairments  Assess qshift and PRN    ? ? ?Discharge Planning:  ?HOme with wife who works from home and daughter who can be there in the afternoons. Wife here daily   ?Team Discussion: ?Started Amantadine yesterday. BP doing better. On lots of BP meds. Cr down, BUN good. No Ritalin due to BP issues.. continent B/B, reports 5/10 pain. Lives with wife and daughter. Fatigues easily. ?  ?Patient on target to meet rehab goals: ?yes, min assist goals. Mod I/supervision SLP goals. Currently bathing/dressing lower body with steady +2. LUE flaccid. Mod assist squat pivot, has good body awareness. Ambulated 50 ft, +2 in hallway with rails. Cognition supervision/min assist. Working on complex problem solving, and attention. Fatigued during SLP sessions.  ? ?*See Care Plan and progress notes for long and short-term goals.  ? ?Revisions to Treatment Plan:  ?Adjusting medications ?  ?Teaching Needs: ?Family education, medication/pain management, safety awareness, transfer/gait training, etc. ?  ?Current Barriers to Discharge: ?Decreased caregiver support, Medication compliance, and cognition. ? ?Possible Resolutions to Barriers: ?Family education ?Follow-up PT/OT/SLP ?Order recommended DME ?  ? ? Medical Summary ?Current Status: LBM this AM; continent B/B; c/o HA's; skin good; L neglect; ? Barriers to Discharge: Decreased family/caregiver support;Home enviroment access/layout;Medical stability;Weight bearing restrictions ? Barriers to Discharge Comments: wife works from home-is here frequently ?Possible Resolutions to [employer]: limitations- L neglect and hemiplegia; still flaccid; R gaze preference; goasl min A- progressing; was Physiological scientist; regular diet; pocketing; impacting by fatigue greatly- which impaoirs cognition- is S-min A for cognition- higher level issues; d/c 3/30- ? ? ?  Continued Need for Acute Rehabilitation Level of Care: The patient requires daily medical management by a physician with specialized training in physical medicine and  rehabilitation for the following reasons: ?Direction of a multidisciplinary physical rehabilitation program to maximize functional independence : Yes ?Medical management of patient stability for increased activity during participation in an intensive rehabilitation regime.: Yes ?Analysis of laboratory values and/or radiology reports with any subsequent need for medication adjustment and/or medical intervention. : Yes ? ? ?I attest that I was present, lead the team conference, and concur with the assessment and plan of the team. ? ? ?Dorthula Nettles G ?04/22/2021, 5:18 PM  ? ? ? ? ? ? ?

## 2021-04-23 NOTE — Progress Notes (Signed)
Speech Language Pathology Daily Session Note ? ?Patient Details  ?Name: Miguel Mccullough ?MRN: [id number] ?Date of Birth: Jun 14, 1965 ? ?Today's Date: 04/23/2021 ?SLP Individual Time: 0930-1000 ?SLP Individual Time Calculation (min): 30 min ? ?Short Term Goals: ?Week 1: SLP Short Term Goal 1 (Week 1): Patient will participate in further cognitive-linguistic evaluation ?SLP Short Term Goal 1 - Progress (Week 1): Met ?SLP Short Term Goal 2 (Week 1): Patient will implement speech intelligibility strategies at the sentence level with min A verbal cues ?SLP Short Term Goal 3 (Week 1): Patient will consume current diet with implementation of safe swallowing compensatory strategies with sup A verbal cues ?SLP Short Term Goal 4 (Week 1): Pt will sustain attention to tasks for 5-6 min with min A verbal and visual cues for redirection. ?SLP Short Term Goal 5 (Week 1): Pt will recall 3-4 details of relevant information after a delay with min A verbal cues. ?SLP Short Term Goal 6 (Week 1): Pt will complete moderately problem solving tasks with min  A verbal and visual cues ? ?Skilled Therapeutic Interventions: Skilled ST treatment focused on cognitive and speech goals. Patient was awake upon arrival but visibly drowsy which mildly impacted speech intelligibility requiring sup-to-min A cues to implement speech strategies at the conversation level. SLP facilitated session by providing overall mod A verbal and visual cues for alternating attention task and left visual scanning. Patient demonstrated good carry over of attention and organization techniques throughout task, and sustained attention to task for 25 minute duration with sup A verbal redirection. Patient was left in _ with alarm activated and immediate needs within reach at end of session. Continue per current plan of care.   ?   ? ?Pain ?Pain Assessment ?Pain Scale: 0-10 ?Pain Score: 0-No pain ? ?Therapy/Group: Individual Therapy ? ?Miguel Mccullough ?04/23/2021, 10:53 AM ?

## 2021-04-23 NOTE — Progress Notes (Signed)
Physical Therapy Session Note ? ?Patient Details  ?Name: Miguel Mccullough ?MRN: [id number] ?Date of Birth: Aug 27, 1965 ? ?Today's Date: 04/23/2021 ?PT Individual Time: 7510-2585 ?PT Individual Time Calculation (min): 70 min  ? ?Short Term Goals: ?Week 1:  PT Short Term Goal 1 (Week 1): Pt will perform bed to wc transfers to R w/min assist and light cues, to L w/mod to max assist of 1 ?PT Short Term Goal 2 (Week 1): Pt will sit statically w/feet supported w/cga ?PT Short Term Goal 3 (Week 1): Pt will ambulate up to 36ft w/LRAD and 2 person assist ?PT Short Term Goal 4 (Week 1): Pt will propel wc 61ft w/R exts and moderate cueing to attend to L environment. ? ?Skilled Therapeutic Interventions/Progress Updates:  ?  pt received in bed and agreeable to therapy. No complaint of pain. Supine>sit with min A toward L side. Squat pivot >w/c with min A. Pt transported to therapy gym for time management and energy conservation. Squat pivot w/c<>mat table, min A progressing to near CGA by last transfer of session, with cueing for placement and incr lateral displacement. Sit>supine with supervision. Pt requested passive hamstring and hip stretching to prevent LBP and increasing tightness in legs. Provided hamstring and internal/external rotation stretch. Transitioned to gait at hall rail, 4 x 8 ft forward and back wards walking with w/c follow and tot A for LLE motion and trunk support. Transitioned back to mat table and performed UE NMR in side lying for progression of bed mobility. Pt demoed active shoulder and tricep activation with weight bearing in semi-sidelying position. Progressed from propped on L elbow weight shift to mini push ups with elbow extended, to full supine>sit with min facilitation and VC. Pt then propelled back to room with min A hemi technique, difficulty reaching floor even with hemi height chair. Pt then performed squat pivot to recliner with min A and was left with all needs in reach and alarm active.   ? ?Therapy Documentation ?Precautions:  ?Precautions ?Precautions: Fall ?Precaution Comments: Flaccid Lt hemi, hx scoliosis, HTN ?Restrictions ?Weight Bearing Restrictions: No ?General: ?  ? ? ? ?Therapy/Group: Individual Therapy ? ?[place] ?04/23/2021, 3:58 PM  ?

## 2021-04-23 NOTE — Progress Notes (Signed)
?                                                       PROGRESS NOTE ? ? ?Subjective/Complaints: ? ?Pt reports LBM yesterday Am- didn't ever need sorbitol.  ?Slept well- less restless per wife; and was somewhat more alert yesterday until after therapy- when drowsy/tired. However, much more awake today.  ? ?HA's much less with BP better controlled- actually none for 2 days.  ? ?ROS:  ? ?Pt denies SOB, abd pain, CP, N/V/C/D, and vision changes ? ? ? ? ? ? ?Objective: ?  ?No results found. ?Recent Labs  ?  04/21/21 ?0603  ?WBC 9.5  ?HGB 13.7  ?HCT 39.8  ?PLT 236  ? ?Recent Labs  ?  04/21/21 ?0603  ?NA 138  ?K 3.6  ?CL 106  ?CO2 23  ?GLUCOSE 104*  ?BUN 28*  ?CREATININE 1.90*  ?CALCIUM 8.9  ? ? ?Intake/Output Summary (Last 24 hours) at 04/23/2021 1142 ?Last data filed at 04/22/2021 2328 ?Gross per 24 hour  ?Intake --  ?Output 900 ml  ?Net -900 ml  ?  ? ?  ? ?Physical Exam: ?Vital Signs ?Blood pressure (!) 149/91, pulse 60, temperature 98 ?F (36.7 ?C), resp. rate 18, height 5\' 4"  (1.626 m), weight 75.9 kg, SpO2 100 %. ? ? ? ?General: awake, alert, appropriate, sitting up in bed; wife at bedside; NAD ?HENT: conjugate gaze- R gaze preference, but did look left a few times; ; oropharynx moist ?CV: regular rate; no JVD ?Pulmonary: CTA B/L; no W/R/R- good air movement ?GI: soft, NT, ND, (+)BS ?Psychiatric: appropriate, but flat ?Neurological: much more alert, interactive; R gaze preference ?Ext: no clubbing, cyanosis, or edema ?Psych: pleasant and cooperative  ?Musculoskeletal:  ?   Cervical back: Neck supple. No tenderness.  ?  ?Skin: ?   General: Skin is warm and dry.  ?Neurological:  ?   Mental Status: He is alert and oriented to person, place, and time.  ?   Comments: Mild left facial weakness with left inattention and mild left hemifacial sensory loss.  Speech clear and able to follow commands without difficulty. Dense left hemiplegia with sensory deficits. LUE /LLE 0/5 except for trace at foot. RUE and RLE 5/5. No  motor/sensory changes today ?  ?  ? ?Assessment/Plan: ?1. Functional deficits which require 3+ hours per day of interdisciplinary therapy in a comprehensive inpatient rehab setting. ?Physiatrist is providing close team supervision and 24 hour management of active medical problems listed below. ?Physiatrist and rehab team continue to assess barriers to discharge/monitor patient progress toward functional and medical goals ? ?Care Tool: ? ?Bathing ?   ?Body parts bathed by patient: Chest, Abdomen, Front perineal area, Right upper leg, Left upper leg, Face  ? Body parts bathed by helper: Right arm, Left arm, Buttocks, Right lower leg, Left lower leg ?  ?  ?Bathing assist Assist Level: 2 Helpers (sit<stand in [place] lift) ?  ?  ?Upper Body Dressing/Undressing ?Upper body dressing   ?What is the patient wearing?: Pull over shirt ?   ?Upper body assist Assist Level: Maximal Assistance - Patient 25 - 49% ?   ?Lower Body Dressing/Undressing ?Lower body dressing ? ? ?   ?What is the patient wearing?: Incontinence brief, Pants ? ?  ? ?Lower body assist Assist for lower  body dressing: 2 Helpers ?   ? ?Toileting ?Toileting Toileting Activity did not occur (Probation officer and hygiene only): N/A (no void or bm)  ?Toileting assist Assist for toileting: 2 Helpers ?  ?  ?Transfers ?Chair/bed transfer ? ?Transfers assist ?   ? ?Chair/bed transfer assist level: 2 Helpers ?  ?  ?Locomotion ?Ambulation ? ? ?Ambulation assist ? ?   ? ?Assist level: 2 helpers ?Assistive device: Hand held assist ?Max distance: 2 ft  ? ?Walk 10 feet activity ? ? ?Assist ? Walk 10 feet activity did not occur: Safety/medical concerns ? ?  ?   ? ?Walk 50 feet activity ? ? ?Assist Walk 50 feet with 2 turns activity did not occur: Safety/medical concerns ? ?  ?   ? ? ?Walk 150 feet activity ? ? ?Assist Walk 150 feet activity did not occur: Safety/medical concerns ? ?  ?  ?  ? ?Walk 10 feet on uneven surface  ?activity ? ? ?Assist Walk 10 feet on uneven  surfaces activity did not occur: Safety/medical concerns ? ? ?  ?   ? ?Wheelchair ? ? ? ? ?Assist Is the patient using a wheelchair?: Yes ?Type of Wheelchair: Manual ?  ? ?Wheelchair assist level: Total Assistance - Patient < 25% ?Max wheelchair distance: 5  ? ? ?Wheelchair 50 feet with 2 turns activity ? ? ? ?Assist ? ?  ?  ? ? ?Assist Level: Total Assistance - Patient < 25%  ? ?Wheelchair 150 feet activity  ? ? ? ?Assist ?   ? ? ?Assist Level: Total Assistance - Patient < 25%  ? ?Blood pressure (!) 149/91, pulse 60, temperature 98 ?F (36.7 ?C), resp. rate 18, height 5\' 4"  (1.626 m), weight 75.9 kg, SpO2 100 %. ? ?Medical Problem List and Plan: ?1. 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            -patient may  shower ?            -ELOS/Goals: 14-20 days- min A to supervision ? D/c date 3/30 ? Con't CIR- PT, OT and SLP ?2.  Antithrombotics: ?-DVT/anticoagulation:  lovenox ? -dopplers reviewed and negative ?            -antiplatelet therapy: N/A due to bleed.  ?3. Pain Management:: On tylenol as needed ?4. Mood: LCSW to follow for evaluation an dsup ?            -antipsychotic agents: N/A ?5. Neuropsych: This patient is capable of making decisions on his own behalf. ?6. Skin/Wound Care: Routine pressure relief measures.  ?7. Fluids/Electrolytes/Nutrition: Monitor I/O. Check CMET in am.  ?8. HTN: goal to keep SBP< 160 ? -3/5 bp remains poorly controlled ?            -- Catapress 2 mg TID, metoprolol increased to 75 mg BID and Norvasc 10 qd. ?  -will begin scheduled hydralazine 50mg  q8 ? -will use addnl hydralazine prn  ? 3/6- just added hydralazine- BP 165/98 this AM- will monitor and follow trend  ?3/7- BP 155/96- coming down- will con't and change tomorrow if still need to.   ?3/8- BP 149/91- still coming down- will wait to increase meds again. If tomorrow it's stable, will increase; if sill coming down, will wait.  ?9. Persistent hypokalemia: Start Kdur 20 meq daily.  Check Mg level ? 3/3- Will replete 40 mEq x2 and recheck Monday- last Mg on 2/28 was 2.3 ?  3/6- K+ up to 3.6- con't to monitor ? 3/8- labs in AM ?10.  Severe LVH: Has been referred to cardiology for work up. Continue metoprolol bid.  ?11. 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  ?--Renal ultrasound with mild evidence of medical renal disease ?            --encourage fluid intake. ?3/3- down slightly to 2.21- will avoid nephrotoxic agents ?3/5 f/u renal fxn Monday ?3/6- renal u/s today- Cr down to 1.90 and BUN 28  ?3/7- will recheck Thursday ?12, Elevated AST: recheck in am. ?13. Hyperlipidemia: Continue Lipitor.  ?14. ABLA: Likely due to bleed with drop from 14.4-->12.6.  ?            --monitor for signs of bleeding. Recheck CBC Monday  ? 3/6- Hb up to 13.7- doing better even with improvement in BUN/Cr.  ?15. L neglect and lethargy-pt appears more alert ? -monitor for need of stimulant ?16. Noncompliance- hadn't taken BP meds in months per pt.  ?17/ Sedation during day with insomnia at night ? 3/6- will add trazodone 75 mg QHS and add Amantadine for wakefulness during day.  ? 3/7- slept better last night- might increase amantadine in 2-3 days.  ? 3/8- much more alert with amantadine and better sleep at night- con't regimen ?18. Constipation ? 3/7- LBM 3 days ago- will give sorbitol if no BM by 3pm. ?3/8- LBM yesterday Am- didn't get sorbitol.   ? ? ?  ? ?LOS: ?6 days ?A FACE TO FACE EVALUATION WAS PERFORMED ? ?Bryn Saline ?04/23/2021, 11:42 AM  ? ? ? ?

## 2021-04-23 NOTE — Progress Notes (Signed)
Occupational Therapy Session Note ? ?Patient Details  ?Name: Miguel Mccullough ?MRN: [id number] ?Date of Birth: Oct 27, 1965 ? ?Today's Date: 04/23/2021 ?OT Individual Time: 8871-9597 ?OT Individual Time Calculation (min): 72 min  ? ? ?Short Term Goals: ?Week 1:  OT Short Term Goal 1 (Week 1): Pt will thread Lt UE into shirt with Mod A and vcs to improve Lt attention ?OT Short Term Goal 2 (Week 1): Pt will verbalize 2 joint protection strategies to use during bed mobility and/or transfers to improve Lt attention ?OT Short Term Goal 3 (Week 1): Education will be provided to pts wife in regards to 2 hemi positioning strategies to implement at home for caregiver education ? ? ?Skilled Therapeutic Interventions/Progress Updates:  ?  Pt greeted at time of session sitting up in recliner finishing up lunch with family members present, agreeable to OT session and no pain at rest but requesting Tylenol at end of session and nursing notified, did not rate pain. Squat pivot recliner > wheelchair Min/Mod A toward R side. Pt agreeable to practice shower transfers but did not want to shower today. Transferred to bathroom and performed squat pivot to L side into shower with Mod A, 1 LOB to the L. Squat pivot back to wheelchair Min/Mod A toward R side. Pt self propelling approx 20 feet throughout hall toward elevator for additional practice with hemipropulsion, able to do so with Min/Mod 2/2 L innattention and running into the wall. Transported to 4th floor ADL apartment and performed sit <> stands at sink/countertop for additional support and to perform table top task for towel slides to promote PROM/AAROM for LUE in standing. Standing at kitchen counter top with Mod/Max A and frequent cues for truncal adjustment, shoulders back, and hips forward for upright posture and cues to decrease L lean. Returned to sitting and transferred to main gym, OT assisting with PROM for LUE starting at shoulder and working distally with passive hold at end  range. Back in room call bell in reach all needs met.  ? ? ?Therapy Documentation ?Precautions:  ?Precautions ?Precautions: Fall ?Precaution Comments: Flaccid Lt hemi, hx scoliosis, HTN ?Restrictions ?Weight Bearing Restrictions: No ? ? ? ? ?Therapy/Group: Individual Therapy ? ?[name] ?04/23/2021, 3:16 PM ?

## 2021-04-24 LAB — BASIC METABOLIC PANEL
Anion gap: 8 (ref 5–15)
BUN: 25 mg/dL — ABNORMAL HIGH (ref 6–20)
CO2: 23 mmol/L (ref 22–32)
Calcium: 8.7 mg/dL — ABNORMAL LOW (ref 8.9–10.3)
Chloride: 108 mmol/L (ref 98–111)
Creatinine, Ser: 1.99 mg/dL — ABNORMAL HIGH (ref 0.61–1.24)
GFR, Estimated: 39 mL/min — ABNORMAL LOW (ref >60)
Glucose, Bld: 94 mg/dL (ref 70–99)
Potassium: 3.5 mmol/L (ref 3.5–5.1)
Sodium: 139 mmol/L (ref 135–145)

## 2021-04-24 LAB — MAGNESIUM: Magnesium: 1.8 mg/dL (ref 1.7–2.4)

## 2021-04-24 MED ORDER — HYDRALAZINE HCL 50 MG PO TABS
100.0000 mg | ORAL_TABLET | Freq: Three times a day (TID) | ORAL | Status: DC
  Administered 2021-04-24 – 2021-05-15 (×62): 100 mg via ORAL
  Filled 2021-04-24 (×66): qty 2

## 2021-04-24 MED ORDER — CITALOPRAM HYDROBROMIDE 20 MG PO TABS
20.0000 mg | ORAL_TABLET | Freq: Every day | ORAL | Status: DC
Start: 2021-04-24 — End: 2021-05-15
  Administered 2021-04-24 – 2021-05-15 (×22): 20 mg via ORAL
  Filled 2021-04-24 (×22): qty 1

## 2021-04-24 NOTE — Progress Notes (Signed)
Physical Therapy Session Note ? ?Patient Details  ?Name: Miguel Mccullough ?MRN: [id number] ?Date of Birth: 1965/03/09 ? ?Today's Date: 04/24/2021 ?PT Individual Time: 1191-4782, 1445-1530 ?PT Individual Time Calculation (min): 58 min, 45 min  ? ?Short Term Goals: ?Week 1:  PT Short Term Goal 1 (Week 1): Pt will perform bed to wc transfers to R w/min assist and light cues, to L w/mod to max assist of 1 ?PT Short Term Goal 2 (Week 1): Pt will sit statically w/feet supported w/cga ?PT Short Term Goal 3 (Week 1): Pt will ambulate up to 25ft w/LRAD and 2 person assist ?PT Short Term Goal 4 (Week 1): Pt will propel wc 87ft w/R exts and moderate cueing to attend to L environment. ? ?Skilled Therapeutic Interventions/Progress Updates:  ?  Session 1: ?Pt received in recliner and agreeable to therapy.  No complaint of pain. Squat pivot with heavy min A to w/c toward L side. Throughout session, squat pivot noted to be heavy min-mod toward L side and min A to R side with pt able to direct transfer set up.  ? ?Sit>supine on mat with min A for trunk and LLE management.  ?Note-Pt independently moved LLE in gravity eliminated position (subconscious movement, didn't realize it had moved) ? ?NMR: ?Pt directed in bridging with manual resistance at L side to improve L glute activation and active hip extension, 4 x 6. Pt participated in self-guided stretching during rest breaks.  ? ?Supine<>sit on mat table with NDT style cueing and facilitation for functional mobility and weight bearing tolerance on LUE. Used gait belt to help lift LLE onto mat, able to perform with min cueing/facilitation before becoming fatigued. Transitioned to seated weight bearing on to LUE progressing from Slight elbow bent push ups to elbow on mat<>straight elbow x 2 bouts. Pt with fatigue but noting increasing LUE involvement in activity with repetition.  ? ?Squat pivot mat>w/c>recliner with min-mod A. Positioned LUE with pillows and pt was left with all needs in reach  and alarm active.  ? ?Session 2: ?Pt received in recliner and agreeable to therapy.  No complaint of pain.  ? ?Transfers: ?Squat pivot recliner>w/c<>mat table<> EOB with as little as CGA to R side, largely min A to R and mod A to L.  ? ?Pt propelled w/c with RUE/LE hemi technique with supervision x 150 ft (cushion removed) for endurance and functional mobility. Occ navigation issues bumping into items on L side, but pt reports he can see/attend to them, just with difficulty steering chair.  ? ?NMR: ?Session focused on sitting balance for improved postural support. Pt performed dynamic reaching outside BOS with CGA, LOB when reaching toward ~50% and recovered with min A, even catching himself on L elbow to push back to midline x1.Transitioned to modified sit ups on physioball 4 x 6 with tactile cueing for alignment, limited by premorbid scoliosis.  ? ?Pt returned to room after session and remained in w/c, was left with all needs in reach and alarm active.  ? ? ?Therapy Documentation ?Precautions:  ?Precautions ?Precautions: Fall ?Precaution Comments: Flaccid Lt hemi, hx scoliosis, HTN ?Restrictions ?Weight Bearing Restrictions: No ?General: ?  ? ? ? ?Therapy/Group: Individual Therapy ? ?[place] ?04/24/2021, 11:21 AM  ?

## 2021-04-24 NOTE — Progress Notes (Signed)
Occupational Therapy Weekly Progress Note ? ?Patient Details  ?Name: Miguel Mccullough ?MRN: [id number] ?Date of Birth: 08-Nov-1965 ? ?Beginning of progress report period: April 18, 2021 ?End of progress report period: April 24, 2021 ? ? ? ?Patient has met 1 of 3 short term goals.  Pt is making good progress toward OT goals only needing on average Min A for sitting balance during ADL tasks, performing shower level bathing with Max A, squat pivot transfers with Min/Mod A, and continues to have 0/5 in LUE with some tone developing. Wife is very involved and present for most OT sessions.  ? ?Patient continues to demonstrate the following deficits: muscle weakness, decreased cardiorespiratoy endurance, impaired timing and sequencing, abnormal tone, unbalanced muscle activation, decreased coordination, and decreased motor planning, decreased visual perceptual skills, decreased attention to left and decreased motor planning, decreased initiation, decreased attention, decreased awareness, decreased problem solving, decreased safety awareness, decreased memory, and delayed processing, and decreased sitting balance, decreased standing balance, decreased postural control, hemiplegia, and decreased balance strategies and therefore will continue to benefit from skilled OT intervention to enhance overall performance with BADL and Reduce care partner burden. ? ?Patient progressing toward long term goals..  Continue plan of care. ? ?OT Short Term Goals ?Week 1:  OT Short Term Goal 1 (Week 1): Pt will thread Lt UE into shirt with Mod A and vcs to improve Lt attention ?OT Short Term Goal 1 - Progress (Week 1): Progressing toward goal ?OT Short Term Goal 2 (Week 1): Pt will verbalize 2 joint protection strategies to use during bed mobility and/or transfers to improve Lt attention ?OT Short Term Goal 2 - Progress (Week 1): Progressing toward goal ?OT Short Term Goal 3 (Week 1): Education will be provided to pts wife in regards to 2 hemi  positioning strategies to implement at home for caregiver education ?OT Short Term Goal 3 - Progress (Week 1): Met ?Week 2:  OT Short Term Goal 1 (Week 2): Pt will verbalize 2 joint protection strategies to use during bed mobility and/or transfers to improve Lt attention ?OT Short Term Goal 2 (Week 2): Pt will thread Lt UE into shirt with Mod A and vcs to improve Lt attention ?OT Short Term Goal 3 (Week 2): Pt will perform AAROM/PROM HEP for LUE with no more than MIN verbal cues ?OT Short Term Goal 4 (Week 2): Pt will perform sit <> stand with LRAD with Mod A of 1 in prep for LB ADL ? ? ? ?Therapy Documentation ?Precautions:  ?Precautions ?Precautions: Fall ?Precaution Comments: Flaccid Lt hemi, hx scoliosis, HTN ?Restrictions ?Weight Bearing Restrictions: No ? ? ? ?Therapy/Group: Individual Therapy ? ?[name] ?04/24/2021, 7:12 AM  ?

## 2021-04-24 NOTE — Progress Notes (Signed)
?                                                       PROGRESS NOTE ? ? ?Subjective/Complaints: ? ?Had another dull HA last night- tylenol helped.  ?Up at 2am - was also what occurred at home.  ?LBM yesterday ? ?Admits is depressed.   ? ?ROS:  ? ?Pt denies SOB, abd pain, CP, N/V/C/D, and vision changes ? ? ? ? ? ? ?Objective: ?  ?No results found. ?No results for input(s): WBC, HGB, HCT, PLT in the last 72 hours. ? ?Recent Labs  ?  04/24/21 ?0623  ?NA 139  ?K 3.5  ?CL 108  ?CO2 23  ?GLUCOSE 94  ?BUN 25*  ?CREATININE 1.99*  ?CALCIUM 8.7*  ? ? ?Intake/Output Summary (Last 24 hours) at 04/24/2021 0820 ?Last data filed at 04/24/2021 0700 ?Gross per 24 hour  ?Intake 504 ml  ?Output --  ?Net 504 ml  ?  ? ?  ? ?Physical Exam: ?Vital Signs ?Blood pressure (!) 156/99, pulse 67, temperature 98.1 ?F (36.7 ?C), resp. rate 16, height 5\' 4"  (1.626 m), weight 75.9 kg, SpO2 100 %. ? ? ? ? ?General: awake, alert, appropriate, leaning left- wife at bedside;  NAD ?HENT: conjugate gaze; oropharynx moist ?CV: regular rate; no JVD ?Pulmonary: CTA B/L; no W/R/R- good air movement ?GI: soft, NT, ND, (+)BS ?Psychiatric: appropriate; flat ?Neurological: alert; leaning to Left- but when asked, pulled to midline ?Ext: no clubbing, cyanosis, or edema ?Musculoskeletal:  ?   Cervical back: Neck supple. No tenderness.  ? 0/5 on L side still ?Skin: ?   General: Skin is warm and dry.  ?Neurological:  ?   Mental Status: He is alert and oriented to person, place, and time.  ?   Comments: Mild left facial weakness with left inattention and mild left hemifacial sensory loss.  Speech clear and able to follow commands without difficulty. Dense left hemiplegia with sensory deficits. LUE /LLE 0/5 except for trace at foot. RUE and RLE 5/5. No motor/sensory changes today ?  ?  ? ?Assessment/Plan: ?1. Functional deficits which require 3+ hours per day of interdisciplinary therapy in a comprehensive inpatient rehab setting. ?Physiatrist is providing close team  supervision and 24 hour management of active medical problems listed below. ?Physiatrist and rehab team continue to assess barriers to discharge/monitor patient progress toward functional and medical goals ? ?Care Tool: ? ?Bathing ?   ?Body parts bathed by patient: Chest, Abdomen, Front perineal area, Right upper leg, Left upper leg, Face  ? Body parts bathed by helper: Right arm, Left arm, Buttocks, Right lower leg, Left lower leg ?  ?  ?Bathing assist Assist Level: 2 Helpers (sit<stand in [place] lift) ?  ?  ?Upper Body Dressing/Undressing ?Upper body dressing   ?What is the patient wearing?: Pull over shirt ?   ?Upper body assist Assist Level: Maximal Assistance - Patient 25 - 49% ?   ?Lower Body Dressing/Undressing ?Lower body dressing ? ? ?   ?What is the patient wearing?: Incontinence brief, Pants ? ?  ? ?Lower body assist Assist for lower body dressing: 2 Helpers ?   ? ?Toileting ?Toileting Toileting Activity did not occur (Probation officer and hygiene only): N/A (no void or bm)  ?Toileting assist Assist for toileting: 2 Helpers ?  ?  ?  Transfers ?Chair/bed transfer ? ?Transfers assist ?   ? ?Chair/bed transfer assist level: 2 Helpers ?  ?  ?Locomotion ?Ambulation ? ? ?Ambulation assist ? ?   ? ?Assist level: 2 helpers ?Assistive device: Hand held assist ?Max distance: 2 ft  ? ?Walk 10 feet activity ? ? ?Assist ? Walk 10 feet activity did not occur: Safety/medical concerns ? ?  ?   ? ?Walk 50 feet activity ? ? ?Assist Walk 50 feet with 2 turns activity did not occur: Safety/medical concerns ? ?  ?   ? ? ?Walk 150 feet activity ? ? ?Assist Walk 150 feet activity did not occur: Safety/medical concerns ? ?  ?  ?  ? ?Walk 10 feet on uneven surface  ?activity ? ? ?Assist Walk 10 feet on uneven surfaces activity did not occur: Safety/medical concerns ? ? ?  ?   ? ?Wheelchair ? ? ? ? ?Assist Is the patient using a wheelchair?: Yes ?Type of Wheelchair: Manual ?  ? ?Wheelchair assist level: Total Assistance - Patient  < 25% ?Max wheelchair distance: 5  ? ? ?Wheelchair 50 feet with 2 turns activity ? ? ? ?Assist ? ?  ?  ? ? ?Assist Level: Total Assistance - Patient < 25%  ? ?Wheelchair 150 feet activity  ? ? ? ?Assist ?   ? ? ?Assist Level: Total Assistance - Patient < 25%  ? ?Blood pressure (!) 156/99, pulse 67, temperature 98.1 ?F (36.7 ?C), resp. rate 16, height 5\' 4"  (1.626 m), weight 75.9 kg, SpO2 100 %. ? ?Medical Problem List and Plan: ?1. 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            -patient may  shower ?            -ELOS/Goals: 14-20 days- min A to supervision ? D/c date 3/30 ? Con't CIR- PT, OT and SLP ?2.  Antithrombotics: ?-DVT/anticoagulation:  lovenox ? -dopplers reviewed and negative ?            -antiplatelet therapy: N/A due to bleed.  ?3. Pain Management:: On tylenol as needed ?4. Mood: LCSW to follow for evaluation an dsup ?            -antipsychotic agents: N/A ?5. Neuropsych: This patient is capable of making decisions on his own behalf. ?6. Skin/Wound Care: Routine pressure relief measures.  ?7. Fluids/Electrolytes/Nutrition: Monitor I/O. Check CMET in am.  ?8. HTN: goal to keep SBP< 160 ? -3/5 bp remains poorly controlled ?            -- Catapress 2 mg TID, metoprolol increased to 75 mg BID and Norvasc 10 qd. ?  -will begin scheduled hydralazine 50mg  q8 ? -will use addnl hydralazine prn  ? 3/6- just added hydralazine- BP 165/98 this AM- will monitor and follow trend  ?3/9- BP 150s/99- will increase hydralazine to 100 mg TID ?9. Persistent hypokalemia: Start Kdur 20 meq daily. Check Mg level ? 3/3- Will replete 40 mEq x2 and recheck Monday- last Mg on 2/28 was 2.3 ? 3/6- K+ up to 3.6- con't to monitor ? 3/8- labs in AM ? 3/9- Mg 1.8 and K+ 3.5- will con't to monitor ?10.  Severe LVH: Has been referred to cardiology for work up. Continue metoprolol bid.  ?11. 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  ?--Renal  ultrasound with mild evidence of medical renal disease ?            --  encourage fluid intake. ?3/3- down slightly to 2.21- will avoid nephrotoxic agents ?3/9- Renal U/S normal- Cr up slightly to 1.99 and BUN 25- think it's leveling out- will con't to monitor ?12, Elevated AST: recheck in am. ?13. Hyperlipidemia: Continue Lipitor.  ?14. ABLA: Likely due to bleed with drop from 14.4-->12.6.  ?            --monitor for signs of bleeding. Recheck CBC Monday  ? 3/6- Hb up to 13.7- doing better even with improvement in BUN/Cr.  ?15. L neglect and lethargy-pt appears more alert ? -monitor for need of stimulant ?16. Noncompliance- hadn't taken BP meds in months per pt.  ?17/ Sedation during day with insomnia at night ? 3/6- will add trazodone 75 mg QHS and add Amantadine for wakefulness during day.  ? 3/7- slept better last night- might increase amantadine in 2-3 days.  ? 3/8- much more alert with amantadine and better sleep at night- con't regimen ? 3/9- much more awake ?18. Constipation ? 3/7- LBM 3 days ago- will give sorbitol if no BM by 3pm. ?3/9- LBM yesterday   ?19. Depression ? 3/9- will add Celexa 20 mg daily for mood- since works faster.  ?20. Headaches ? 3/9- tylneol helps- but if continues til next week, will try adding Elavil QHS for HA prevention.  ? ? ?I spent a total of  39  minutes on total care today- >50% coordination of care- due to prolonged d/w pt about mood and HA's. Also BP mgmt ? ? ?  ? ?LOS: ?7 days ?A FACE TO FACE EVALUATION WAS PERFORMED ? ?Airyonna Franklyn ?04/24/2021, 8:20 AM  ? ? ? ?

## 2021-04-24 NOTE — Progress Notes (Signed)
Occupational Therapy Session Note ? ?Patient Details  ?Name: Miguel Mccullough ?MRN: [id number] ?Date of Birth: August 29, 1965 ? ?Today's Date: 04/24/2021 ?OT Individual Time: 3646-8032 ?OT Individual Time Calculation (min): 56 min  ? ? ?Short Term Goals: ?Week 1:  OT Short Term Goal 1 (Week 1): Pt will thread Lt UE into shirt with Mod A and vcs to improve Lt attention ?OT Short Term Goal 2 (Week 1): Pt will verbalize 2 joint protection strategies to use during bed mobility and/or transfers to improve Lt attention ?OT Short Term Goal 3 (Week 1): Education will be provided to pts wife in regards to 2 hemi positioning strategies to implement at home for caregiver education ? ? ?Skilled Therapeutic Interventions/Progress Updates:  ?  Pt greeted at time of session bed level resting with wife [name] present who remained throughout session. Pt agreeable to shower level bathing and performed supine > sit MIN A, squat pivot bed > wheelchair > shower bench Min/Mod and transferred back out of shower later fatigued with Mod A and hemi techniques. Doffed clothing with leans on bench and performed UB/LB bathing with Max A overall 2/2 needing at minimum Min A up to Mod/Max at times when closing eyes and using RUE to bathe instead of for sitting balance. Dried off seated with Max A and transferred back to wheelchair. Hand over hand for bathing and dressing tasks for LUE NMR. Set up at sink for dressing tasks, Max A for pull over shirt with hemitechniques and total/Max for pants, donning underwear and pants at same time for energy conservation, sit > stand with knee block at sink, mirror for feedback for body awareness. Squat pivot to recliner Min A. Alarm on call bell iun reach ? ? ? ?Therapy Documentation ?Precautions:  ?Precautions ?Precautions: Fall ?Precaution Comments: Flaccid Lt hemi, hx scoliosis, HTN ?Restrictions ?Weight Bearing Restrictions: No ? ? ? ? ?Therapy/Group: Individual Therapy ? ?[name] ?04/24/2021, 7:11 AM ?

## 2021-04-25 NOTE — Progress Notes (Signed)
?                                                       PROGRESS NOTE ? ? ?Subjective/Complaints: ? ?Having another HA this Am- dull- taking tylenol.  ?Working on midline with PT.  ?LBM yesterday ? ?Wants to wait on HA prevention meds.  ? ?ROS:  ? ?Pt denies SOB, abd pain, CP, N/V/C/D, and vision changes ? ? ? ? ? ? ?Objective: ?  ?No results found. ?No results for input(s): WBC, HGB, HCT, PLT in the last 72 hours. ? ?Recent Labs  ?  04/24/21 ?0623  ?NA 139  ?K 3.5  ?CL 108  ?CO2 23  ?GLUCOSE 94  ?BUN 25*  ?CREATININE 1.99*  ?CALCIUM 8.7*  ? ? ?Intake/Output Summary (Last 24 hours) at 04/25/2021 0846 ?Last data filed at 04/25/2021 0700 ?Gross per 24 hour  ?Intake 960 ml  ?Output --  ?Net 960 ml  ?  ? ?  ? ?Physical Exam: ?Vital Signs ?Blood pressure (!) 158/86, pulse 67, temperature 97.8 ?F (36.6 ?C), resp. rate 16, height 5\' 4"  (1.626 m), weight 75.9 kg, SpO2 98 %. ? ? ? ? ? ?General: awake, alert, appropriate,sitting EOB with OT_ working on midline;  NAD ?HENT: conjugate gaze; oropharynx moist- R gaze preference ?CV: regular rate; no JVD ?Pulmonary: CTA B/L; no W/R/R- good air movement ?GI: soft, NT, ND, (+)BS ?Psychiatric: appropriate- depressed affect, but smiled x1! ?Neurological: alert, flat ?Ext: no clubbing, cyanosis, or edema ?Musculoskeletal:  ?   Cervical back: Neck supple. No tenderness.  ? 0/5 on L side still ?Skin: ?   General: Skin is warm and dry.  ?Neurological:  ?   Mental Status: He is alert and oriented to person, place, and time.  ?   Comments: Mild left facial weakness with left inattention and mild left hemifacial sensory loss.  Speech clear and able to follow commands without difficulty. Dense left hemiplegia with sensory deficits. LUE /LLE 0/5 except for trace at foot. RUE and RLE 5/5. No motor/sensory changes today ?  ?  ? ?Assessment/Plan: ?1. Functional deficits which require 3+ hours per day of interdisciplinary therapy in a comprehensive inpatient rehab setting. ?Physiatrist is providing  close team supervision and 24 hour management of active medical problems listed below. ?Physiatrist and rehab team continue to assess barriers to discharge/monitor patient progress toward functional and medical goals ? ?Care Tool: ? ?Bathing ?   ?Body parts bathed by patient: Chest, Abdomen, Front perineal area, Right upper leg, Left upper leg, Face, Left arm, Right lower leg, Left lower leg  ? Body parts bathed by helper: Buttocks, Right arm, Right lower leg, Left lower leg ?  ?  ?Bathing assist Assist Level: Maximal Assistance - Patient 24 - 49% ?  ?  ?Upper Body Dressing/Undressing ?Upper body dressing   ?What is the patient wearing?: Pull over shirt ?   ?Upper body assist Assist Level: Maximal Assistance - Patient 25 - 49% ?   ?Lower Body Dressing/Undressing ?Lower body dressing ? ? ?   ?What is the patient wearing?: Pants, Underwear/pull up ? ?  ? ?Lower body assist Assist for lower body dressing: Total Assistance - Patient < 25% ?   ? ?Toileting ?Toileting Toileting Activity did not occur (Probation officer and hygiene only): N/A (no void or bm)  ?  Toileting assist Assist for toileting: 2 Helpers ?  ?  ?Transfers ?Chair/bed transfer ? ?Transfers assist ?   ? ?Chair/bed transfer assist level: 2 Helpers ?  ?  ?Locomotion ?Ambulation ? ? ?Ambulation assist ? ?   ? ?Assist level: 2 helpers ?Assistive device: Hand held assist ?Max distance: 2 ft  ? ?Walk 10 feet activity ? ? ?Assist ? Walk 10 feet activity did not occur: Safety/medical concerns ? ?  ?   ? ?Walk 50 feet activity ? ? ?Assist Walk 50 feet with 2 turns activity did not occur: Safety/medical concerns ? ?  ?   ? ? ?Walk 150 feet activity ? ? ?Assist Walk 150 feet activity did not occur: Safety/medical concerns ? ?  ?  ?  ? ?Walk 10 feet on uneven surface  ?activity ? ? ?Assist Walk 10 feet on uneven surfaces activity did not occur: Safety/medical concerns ? ? ?  ?   ? ?Wheelchair ? ? ? ? ?Assist Is the patient using a wheelchair?: Yes ?Type of  Wheelchair: Manual ?  ? ?Wheelchair assist level: Total Assistance - Patient < 25% ?Max wheelchair distance: 5  ? ? ?Wheelchair 50 feet with 2 turns activity ? ? ? ?Assist ? ?  ?  ? ? ?Assist Level: Total Assistance - Patient < 25%  ? ?Wheelchair 150 feet activity  ? ? ? ?Assist ?   ? ? ?Assist Level: Total Assistance - Patient < 25%  ? ?Blood pressure (!) 158/86, pulse 67, temperature 97.8 ?F (36.6 ?C), resp. rate 16, height 5\' 4"  (1.626 m), weight 75.9 kg, SpO2 98 %. ? ?Medical Problem List and Plan: ?1. 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            -patient may  shower ?            -ELOS/Goals: 14-20 days- min A to supervision ? D/c date 3/30 ? Working on midline- cannot stay from L lean- con't CIR- PT, OT and SLP ?2.  Antithrombotics: ?-DVT/anticoagulation:  lovenox ? -dopplers reviewed and negative ?            -antiplatelet therapy: N/A due to bleed.  ?3. Pain Management:: On tylenol as needed ?4. Mood: LCSW to follow for evaluation an dsup ?            -antipsychotic agents: N/A ?5. Neuropsych: This patient is capable of making decisions on his own behalf. ?6. Skin/Wound Care: Routine pressure relief measures.  ?7. Fluids/Electrolytes/Nutrition: Monitor I/O. Check CMET in am.  ?8. HTN: goal to keep SBP< 160 ? -3/5 bp remains poorly controlled ?            -- Catapress 2 mg TID, metoprolol increased to 75 mg BID and Norvasc 10 qd. ?  -will begin scheduled hydralazine 50mg  q8 ? -will use addnl hydralazine prn  ? 3/6- just added hydralazine- BP 165/98 this AM- will monitor and follow trend  ?3/9- BP 150s/99- will increase hydralazine to 100 mg TID ? 3/10- just increased-hydralazine-  BP 150s/90s- wait to let it kick in ?9. Persistent hypokalemia: Start Kdur 20 meq daily. Check Mg level ? 3/3- Will replete 40 mEq x2 and recheck Monday- last Mg on 2/28 was 2.3 ? 3/6- K+ up to 3.6- con't to monitor ? 3/8- labs in AM ? 3/9- Mg 1.8 and K+ 3.5- will con't to  monitor ?10.  Severe LVH: Has been referred to cardiology for work  up. Continue metoprolol bid.  ?11. 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  ?--Renal ultrasound with mild evidence of medical renal disease ?            --encourage fluid intake. ?3/3- down slightly to 2.21- will avoid nephrotoxic agents ?3/9- Renal U/S normal- Cr up slightly to 1.99 and BUN 25- think it's leveling out- will con't to monitor ?12, Elevated AST: recheck in am. ?13. Hyperlipidemia: Continue Lipitor.  ?14. ABLA: Likely due to bleed with drop from 14.4-->12.6.  ?            --monitor for signs of bleeding. Recheck CBC Monday  ? 3/6- Hb up to 13.7- doing better even with improvement in BUN/Cr.  ?15. L neglect and lethargy-pt appears more alert ? -monitor for need of stimulant ?16. Noncompliance- hadn't taken BP meds in months per pt.  ?17/ Sedation during day with insomnia at night ? 3/6- will add trazodone 75 mg QHS and add Amantadine for wakefulness during day.  ? 3/7- slept better last night- might increase amantadine in 2-3 days.  ? 3/8- much more alert with amantadine and better sleep at night- con't regimen ? 3/9- much more awake ?18. Constipation ? 3/7- LBM 3 days ago- will give sorbitol if no BM by 3pm. ?3/10 - LBM yesterday  ?19. Depression ? 3/9- will add Celexa 20 mg daily for mood- since works faster.  ?20. Headaches ? 3/9- tylneol helps- but if continues til next week, will try adding Elavil QHS for HA prevention.  ? 3/10- pt wants ot wait on Elavil for now ? ? ? ? ?LOS: ?8 days ?A FACE TO FACE EVALUATION WAS PERFORMED ? ?Miguel Mccullough ?04/25/2021, 8:46 AM  ? ? ? ?

## 2021-04-25 NOTE — Progress Notes (Signed)
Occupational Therapy Session Note ? ?Patient Details  ?Name: Miguel Mccullough ?MRN: [id number] ?Date of Birth: Mar 10, 1965 ? ?Today's Date: 04/25/2021 ?OT Individual Time: 210 [id number] 7221 ?OT Individual Time Calculation (min): 70 min  ? ? ?Short Term Goals: ?Week 1:  OT Short Term Goal 1 (Week 1): Pt will thread Lt UE into shirt with Mod A and vcs to improve Lt attention ?OT Short Term Goal 1 - Progress (Week 1): Progressing toward goal ?OT Short Term Goal 2 (Week 1): Pt will verbalize 2 joint protection strategies to use during bed mobility and/or transfers to improve Lt attention ?OT Short Term Goal 2 - Progress (Week 1): Progressing toward goal ?OT Short Term Goal 3 (Week 1): Education will be provided to pts wife in regards to 2 hemi positioning strategies to implement at home for caregiver education ?OT Short Term Goal 3 - Progress (Week 1): Met ?Week 2:  OT Short Term Goal 1 (Week 2): Pt will verbalize 2 joint protection strategies to use during bed mobility and/or transfers to improve Lt attention ?OT Short Term Goal 2 (Week 2): Pt will thread Lt UE into shirt with Mod A and vcs to improve Lt attention ?OT Short Term Goal 3 (Week 2): Pt will perform AAROM/PROM HEP for LUE with no more than MIN verbal cues ?OT Short Term Goal 4 (Week 2): Pt will perform sit <> stand with LRAD with Mod A of 1 in prep for LB ADL ? ? ?Skilled Therapeutic Interventions/Progress Updates:  ?Patient met lying supine in bed in agreement with OT treatment session. Mild headache reported at start of session. RN made aware of patient request for tylenol. Supine to EOB with cues for hemi technique and light assist at trunk. Static sitting balance at EOB with multimodal cues for attention to midline. Able to doff/don overhead shirt with cues for hemi technique and Mod A. Several squat-pivot transfers to R with heavy Min A and to L with Mod A. Wc mobility short distance to sink surface with Min A and cues for hemi technique. Max A for incorporation of  LUE during oral hygiene task seated EOB with added challenge of completing task in unsupported sitting position with trunk off wc. Total A for wc transport to and from rehab gym on 4th floor. Seated on mat table, patient initiated LUE joint approximation and weight bearing with lateral leans and visual feedback from mirror to improve trunk control and orientation to midline. Shoe strings exchanged for elastic shoe laces to improve independence with donning footwear. Able to don R shoe with set-up assist and Max A to don L shoe 2/2 continued dense hemiplegia. Sit to stand from mat table with +2 assist and heavy assist for position of LLE including L knee block. Session concluded with patient seated in recliner with call bell within reach and all needs met. Wife present at bedside.  ? ?Therapy Documentation ?Precautions:  ?Precautions ?Precautions: Fall ?Precaution Comments: Flaccid Lt hemi, hx scoliosis, HTN ?Restrictions ?Weight Bearing Restrictions: No ?General: ?  ? ?Therapy/Group: Individual Therapy ? ?Miguel Mccullough ?04/25/2021, 6:39 AM ?

## 2021-04-25 NOTE — Progress Notes (Signed)
Speech Language Pathology Weekly Progress and Session Note ? ?Patient Details  ?Name: Miguel Mccullough ?MRN: [id number] ?Date of Birth: 1965/05/13 ? ?Beginning of progress report period: April 18, 2021 ?End of progress report period: April 25, 2021 ? ?Today's Date: 04/25/2021 ?SLP Individual Time: 1100-1200 ?SLP Individual Time Calculation (min): 60 min ? ?Short Term Goals: ?Week 1: SLP Short Term Goal 1 (Week 1): Patient will participate in further cognitive-linguistic evaluation ?SLP Short Term Goal 1 - Progress (Week 1): Met ?SLP Short Term Goal 2 (Week 1): Patient will implement speech intelligibility strategies at the sentence level with min A verbal cues ?SLP Short Term Goal 2 - Progress (Week 1): Met ?SLP Short Term Goal 3 (Week 1): Patient will consume current diet with implementation of safe swallowing compensatory strategies with sup A verbal cues ?SLP Short Term Goal 3 - Progress (Week 1): Met ?SLP Short Term Goal 4 (Week 1): Pt will sustain attention to tasks for 5-6 min with min A verbal and visual cues for redirection. ?SLP Short Term Goal 4 - Progress (Week 1): Met ?SLP Short Term Goal 5 (Week 1): Pt will recall 3-4 details of relevant information after a delay with min A verbal cues. ?SLP Short Term Goal 5 - Progress (Week 1): Met ?SLP Short Term Goal 6 (Week 1): Pt will complete moderately problem solving tasks with min  A verbal and visual cues ?SLP Short Term Goal 6 - Progress (Week 1): Met ? ?  ?New Short Term Goals: ?Week 2: SLP Short Term Goal 1 (Week 2): Patient will implement speech intelligibility strategies at the sentence level with sup A verbal cues when fatigued ?SLP Short Term Goal 2 (Week 2): Patient will consume current diet with implementation of safe swallowing compensatory strategies with mod I verbal cues ?SLP Short Term Goal 3 (Week 2): Pt will alternate attention between two tasks/concepts with min A verbal redirection ?SLP Short Term Goal 4 (Week 2): Pt will recall 3-4 details of  relevant information after a delay with sup A verbal cues for use of memory strategies ?SLP Short Term Goal 5 (Week 2): Pt will complete complex problem solving tasks with min A verbal and visual cues ? ?Weekly Progress Updates: Patient has made functional gains and has met 5  of 5 STGs this reporting period. Participation has fluctuated secondary to drowsiness which has impacted functional participation and carry over of cognitive and speech strategies. Patient is currently completing cognitive-liguistic tasks in regards to attention, mild-to-moderately complex problem solving, and functional recall with min A verbal/visual cues. When alert, pt is able to implement speech intelligibility strategies with mod I-to-sup A cues, but more min A when fatigued. Patient is consuming a regular texture diet and thin liquids with sup A for monitoring trace-to-mild anterior spillage and L pocketing. Patient and family education is ongoing. Patient would benefit from continued skilled SLP intervention to maximize cognitive, speech, and swallow functioning and overall functional independence prior to discharge.  ? ?Intensity: Minumum of 1-2 x/day, 30 to 90 minutes ?Frequency: 3 to 5 out of 7 days ?Duration/Length of Stay: 3/30 ?Treatment/Interventions: Patient/family education;Dysphagia/aspiration precaution training;Cognitive remediation/compensation;Speech/Language facilitation;Functional tasks;Therapeutic Activities ? ?Daily Session ?Skilled Therapeutic Interventions: Skilled ST treatment focused on cognitive goals. Pt was accompanied by spouse, daughter, and newborn granddaughter this date. Pt in good spirits from being able to hold his granddaughter. SLP facilitated session by providing overall supervision A verbal cues for organizing BID pillbox. Patient had errors x2 which were not initially recognized, however he was  able to repair with 100% accuracy after they were identified for him. SLP educated pt and spouse on  various pillboxes and techniques/modifications to support independence considering pt is limited to using one hand at this time. SLP also educated on organization strategies to improve accuracy. SLP then facilitated problem solving skills, visual scanning, and reasoning with novel card game "Blink" with sup A cues for error awareness and benefited from additional processing time.  Patient was left in wheelchair with alarm activated and immediate needs within reach at end of session. Continue per current plan of care.     ? ?General  ?  ?Pain ? None ? ?Therapy/Group: Individual Therapy ? ?Miski Feldpausch T Nanette Wirsing ?04/25/2021, 7:43 AM ? ? ? ? ? ? ?

## 2021-04-25 NOTE — Progress Notes (Signed)
Physical Therapy Weekly Progress Note ? ?Patient Details  ?Name: Miguel Mccullough ?MRN: [id number] ?Date of Birth: 03-12-1965 ? ?Beginning of progress report period: April 18, 2021 ?End of progress report period: April 25, 2021 ? ?Today's Date: 04/25/2021 ?PT Individual Time: 2633-3545 ?PT Individual Time Calculation (min): 54 min  ? ?Patient has met 3 of 3 short term goals.  Pt performing bed mobility on mat table with min-CGA and gait belt as leg lifter. Squat pivots to R side with min A and to L side with mod A. Ambulating with mod-max A for LLE advancement and postural support.  ? ?Patient continues to demonstrate the following deficits muscle weakness, decreased cardiorespiratoy endurance, impaired timing and sequencing, abnormal tone, ataxia, decreased coordination, and decreased motor planning, and decreased sitting balance, decreased standing balance, decreased postural control, hemiplegia, and decreased balance strategies and therefore will continue to benefit from skilled PT intervention to increase functional independence with mobility. ? ?Patient progressing toward long term goals..  Continue plan of care. ? ?PT Short Term Goals ?Week 1:  PT Short Term Goal 1 (Week 1): Pt will perform bed to wc transfers to R w/min assist and light cues, to L w/mod to max assist of 1 ?PT Short Term Goal 1 - Progress (Week 1): Met ?PT Short Term Goal 2 (Week 1): Pt will sit statically w/feet supported w/cga ?PT Short Term Goal 2 - Progress (Week 1): Met ?PT Short Term Goal 3 (Week 1): Pt will ambulate up to 44f w/LRAD and 2 person assist ?PT Short Term Goal 3 - Progress (Week 1): Met ?PT Short Term Goal 4 (Week 1): Pt will propel wc 232fw/R exts and moderate cueing to attend to L environment. ?PT Short Term Goal 4 - Progress (Week 1): Met ?Week 2:  PT Short Term Goal 1 (Week 2): Pt will self propel w/c with hemi technique with supervision and no cues ?PT Short Term Goal 2 (Week 2): Pt will initiate standing transfer  training ?PT Short Term Goal 3 (Week 2): Pt will complete bed mobility with supervision ? ?Skilled Therapeutic Interventions/Progress Updates:  ?Pt seated in w/c on arrival and agreeable to therapy. No complaint of pain. Pt transported to therapy gym for time management and energy conservation. Session focused on weight bearing and postural control in // bars. Therapist provided LLE knee block and manual facilitation for weight shifting and hip extension, verbal and visual feedback for upright posture and weight distribution. Pt performed squats 3 x 6 for improved glute activation in standing to achieve upright trunk. Then progressed to 4 bouts of pre gait stepping with RLE for forced use of LLE, weight shifting for stepping, and endurance. Noted increased quad activation during weight bearing with repetition. Pt returned to room and performed mod A squat pivot to recliner and was left with all needs in reach and alarm active.  ? ?Therapy Documentation ?Precautions:  ?Precautions ?Precautions: Fall ?Precaution Comments: Flaccid Lt hemi, hx scoliosis, HTN ?Restrictions ?Weight Bearing Restrictions: No ?General: ?  ? ? ? ?Therapy/Group: Individual Therapy ? ?OlSt. Anthony3/11/2021, 3:46 PM  ?

## 2021-04-26 NOTE — Progress Notes (Signed)
Speech Language Pathology Daily Session Note ? ?Patient Details  ?Name: MINDY BEHNKEN ?MRN: [id number] ?Date of Birth: 1965-03-04 ? ?Today's Date: 04/26/2021 ?SLP Individual Time: 0800-0900 ?SLP Individual Time Calculation (min): 60 min ? ?Short Term Goals: ?Week 2: SLP Short Term Goal 1 (Week 2): Patient will implement speech intelligibility strategies at the sentence level with sup A verbal cues when fatigued ?SLP Short Term Goal 2 (Week 2): Patient will consume current diet with implementation of safe swallowing compensatory strategies with mod I verbal cues ?SLP Short Term Goal 3 (Week 2): Pt will alternate attention between two tasks/concepts with min A verbal redirection ?SLP Short Term Goal 4 (Week 2): Pt will recall 3-4 details of relevant information after a delay with sup A verbal cues for use of memory strategies ?SLP Short Term Goal 5 (Week 2): Pt will complete complex problem solving tasks with min A verbal and visual cues ? ?Skilled Therapeutic Interventions: Skilled ST treatment focused on cognitive goals. SLP facilitated session by providing min-to-mod A cues for complex problem solving, deductive reasoning, and organization skills during higher level problem solving tasks. Patient benefited from sup A verbal cues for carry over of organization strategies and extended processing time. Speech was perceived as 100% intelligible at the conversation level with mod I for implementation of strategies. Patient was left in recliner with alarm activated and immediate needs within reach at end of session. Continue per current plan of care.   ?   ?Pain ?  ? ?Therapy/Group: Individual Therapy ? ?Atiyana Welte T Ruford Dudzinski ?04/26/2021, 12:00 PM ?

## 2021-04-27 NOTE — Progress Notes (Signed)
?                                                       PROGRESS NOTE ? ? ?Subjective/Complaints: ? ?Pt hasn't had another HA since Friday AM. Doesn't want prevention meds.  ?Sleeping better.  ?Not waking up at 2-3am anymore.  ?More alert per wife- ? ? ?ROS:  ? ?Pt denies SOB, abd pain, CP, N/V/C/D, and vision changes ? ? ? ? ? ? ?Objective: ?  ?No results found. ?No results for input(s): WBC, HGB, HCT, PLT in the last 72 hours. ? ?No results for input(s): NA, K, CL, CO2, GLUCOSE, BUN, CREATININE, CALCIUM in the last 72 hours. ? ? ?Intake/Output Summary (Last 24 hours) at 04/27/2021 1453 ?Last data filed at 04/27/2021 1108 ?Gross per 24 hour  ?Intake 712 ml  ?Output 250 ml  ?Net 462 ml  ?  ? ?  ? ?Physical Exam: ?Vital Signs ?Blood pressure (!) 122/57, pulse 69, temperature 98.1 ?F (36.7 ?C), resp. rate 18, height 5\' 4"  (1.626 m), weight 75.9 kg, SpO2 100 %. ? ? ? ? ? ? ?General: awake, alert, appropriate, sitting up in bed; scanning room better; wife at bedside; NAD ?HENT: less R  gaze preference; oropharynx moist- wearing EGs ?CV: regular rate; no JVD ?Pulmonary: CTA B/L; no W/R/R- good air movement ?GI: soft, NT, ND, (+)BS ?Psychiatric: appropriate- flat, but more interactive ?Neurological: alert ? ?Ext: no clubbing, cyanosis, or edema ?Musculoskeletal:  ?   Cervical back: Neck supple. No tenderness.  ? 0/5 on L side still ?Skin: ?   General: Skin is warm and dry.  ?Neurological:  ?   Mental Status: He is alert and oriented to person, place, and time.  ?   Comments: Mild left facial weakness with left inattention and mild left hemifacial sensory loss.  Speech clear and able to follow commands without difficulty. Dense left hemiplegia with sensory deficits. LUE /LLE 0/5 except for trace at foot. RUE and RLE 5/5. No motor/sensory changes today ?  ?  ? ?Assessment/Plan: ?1. Functional deficits which require 3+ hours per day of interdisciplinary therapy in a comprehensive inpatient rehab setting. ?Physiatrist is  providing close team supervision and 24 hour management of active medical problems listed below. ?Physiatrist and rehab team continue to assess barriers to discharge/monitor patient progress toward functional and medical goals ? ?Care Tool: ? ?Bathing ?   ?Body parts bathed by patient: Chest, Abdomen, Front perineal area, Right upper leg, Left upper leg, Face, Left arm, Right lower leg, Left lower leg  ? Body parts bathed by helper: Buttocks, Right arm, Right lower leg, Left lower leg ?  ?  ?Bathing assist Assist Level: Maximal Assistance - Patient 24 - 49% ?  ?  ?Upper Body Dressing/Undressing ?Upper body dressing   ?What is the patient wearing?: Pull over shirt ?   ?Upper body assist Assist Level: Maximal Assistance - Patient 25 - 49% ?   ?Lower Body Dressing/Undressing ?Lower body dressing ? ? ?   ?What is the patient wearing?: Pants, Underwear/pull up ? ?  ? ?Lower body assist Assist for lower body dressing: Total Assistance - Patient < 25% ?   ? ?Toileting ?Toileting Toileting Activity did not occur (Probation officer and hygiene only): N/A (no void or bm)  ?Toileting assist Assist for toileting: 2 Helpers ?  ?  ?  Transfers ?Chair/bed transfer ? ?Transfers assist ?   ? ?Chair/bed transfer assist level: Minimal Assistance - Patient > 75% ?  ?  ?Locomotion ?Ambulation ? ? ?Ambulation assist ? ?   ? ?Assist level: 2 helpers ?Assistive device: Lite Gait ?Max distance: 100 ft  ? ?Walk 10 feet activity ? ? ?Assist ? Walk 10 feet activity did not occur: Safety/medical concerns ? ?Assist level: 2 helpers ?Assistive device: Lite Gait  ? ?Walk 50 feet activity ? ? ?Assist Walk 50 feet with 2 turns activity did not occur: Safety/medical concerns ? ?Assist level: 2 helpers ?Assistive device: Lite Gait  ? ? ?Walk 150 feet activity ? ? ?Assist Walk 150 feet activity did not occur: Safety/medical concerns ? ?  ?  ?  ? ?Walk 10 feet on uneven surface  ?activity ? ? ?Assist Walk 10 feet on uneven surfaces activity did not  occur: Safety/medical concerns ? ? ?  ?   ? ?Wheelchair ? ? ? ? ?Assist Is the patient using a wheelchair?: Yes ?Type of Wheelchair: Manual ?  ? ?Wheelchair assist level: Supervision/Verbal cueing ?Max wheelchair distance: 150 ft  ? ? ?Wheelchair 50 feet with 2 turns activity ? ? ? ?Assist ? ?  ?  ? ? ?Assist Level: Supervision/Verbal cueing  ? ?Wheelchair 150 feet activity  ? ? ? ?Assist ?   ? ? ?Assist Level: Supervision/Verbal cueing  ? ?Blood pressure (!) 122/57, pulse 69, temperature 98.1 ?F (36.7 ?C), resp. rate 18, height 5\' 4"  (1.626 m), weight 75.9 kg, SpO2 100 %. ? ?Medical Problem List and Plan: ?1. 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            -patient may  shower ?            -ELOS/Goals: 14-20 days- min A to supervision ? D/c date 3/30 ? Con't CIR_ PT, OT and SLP ?2.  Antithrombotics: ?-DVT/anticoagulation:  lovenox ? -dopplers reviewed and negative ?            -antiplatelet therapy: N/A due to bleed.  ?3. Pain Management:: On tylenol as needed ?4. Mood: LCSW to follow for evaluation an dsup ?            -antipsychotic agents: N/A ?5. Neuropsych: This patient is capable of making decisions on his own behalf. ?6. Skin/Wound Care: Routine pressure relief measures.  ?7. Fluids/Electrolytes/Nutrition: Monitor I/O. Check CMET in am.  ?8. HTN: goal to keep SBP< 160 ? -3/5 bp remains poorly controlled ?            -- Catapress 2 mg TID, metoprolol increased to 75 mg BID and Norvasc 10 qd. ?  -will begin scheduled hydralazine 50mg  q8 ? -will use addnl hydralazine prn  ? 3/6- just added hydralazine- BP 165/98 this AM- will monitor and follow trend  ?3/9- BP 150s/99- will increase hydralazine to 100 mg TID ? 3/10- just increased-hydralazine-  BP 150s/90s- wait to let it kick in ?3/12- BP 122/57 this AM- con't regimen ?9. Persistent hypokalemia: Start Kdur 20 meq daily. Check Mg level ? 3/3- Will replete 40 mEq x2 and recheck Monday- last Mg on 2/28 was  2.3 ? 3/6- K+ up to 3.6- con't to monitor ? 3/8- labs in AM ? 3/9- Mg 1.8 and K+ 3.5- will con't to monitor ? 3/12- labs Monday  ?10.  Severe LVH: Has been referred to cardiology for work up. Continue metoprolol bid.  ?11. CKD  related to untreated HTN?: SCr-2.05 at admission-->2.41 with rise in BUN to 28.  ?--Renal ultrasound with mild evidence of medical renal disease ?            --encourage fluid intake. ?3/3- down slightly to 2.21- will avoid nephrotoxic agents ?3/9- Renal U/S normal- Cr up slightly to 1.99 and BUN 25- think it's leveling out- will con't to monitor ?12, Elevated AST: recheck in am. ?13. Hyperlipidemia: Continue Lipitor.  ?14. ABLA: Likely due to bleed with drop from 14.4-->12.6.  ?            --monitor for signs of bleeding. Recheck CBC Monday  ? 3/6- Hb up to 13.7- doing better even with improvement in BUN/Cr.  ?15. L neglect and lethargy-pt appears more alert ? -monitor for need of stimulant ?16. Noncompliance- hadn't taken BP meds in months per pt.  ?17/ Sedation during day with insomnia at night ? 3/6- will add trazodone 75 mg QHS and add Amantadine for wakefulness during day.  ? 3/7- slept better last night- might increase amantadine in 2-3 days.  ? 3/8- much more alert with amantadine and better sleep at night- con't regimen ? 3/9- much more awake ?18. Constipation ? 3/7- LBM 3 days ago- will give sorbitol if no BM by 3pm. ?3/10 - LBM yesterday ?3/12- going more regularly per wife  ?19. Depression ? 3/9- will add Celexa 20 mg daily for mood- since works faster.  ?20. Headaches ? 3/9- tylneol helps- but if continues til next week, will try adding Elavil QHS for HA prevention.  ? 3/10- pt wants ot wait on Elavil for now ? 3/12- no more HA's since Friday ? ?I spent a total of 36   minutes on total care today- >50% coordination of care- due to d/w pt about possible HA prevention meds vs not; and discussion about mood.  ? ? ? ? ? ?LOS: ?10 days ?A FACE TO FACE EVALUATION WAS PERFORMED ? ?Miguel Mccullough ?04/27/2021, 2:53 PM  ? ? ? ?

## 2021-04-27 NOTE — Progress Notes (Signed)
Physical Therapy Session Note ? ?Patient Details  ?Name: Miguel Mccullough ?MRN: [id number] ?Date of Birth: 02-Oct-1965 ? ?Today's Date: 04/27/2021 ?PT Individual Time: 1102-1200 ?PT Individual Time Calculation (min): 58 min  ? ?Short Term Goals: ?Week 2:  PT Short Term Goal 1 (Week 2): Pt will self propel w/c with hemi technique with supervision and no cues ?PT Short Term Goal 2 (Week 2): Pt will initiate standing transfer training ?PT Short Term Goal 3 (Week 2): Pt will complete bed mobility with supervision ? ?Skilled Therapeutic Interventions/Progress Updates:  ?Chart reviewed and pt agreeable to therapy. Pt received in toilet with family assist using STEDY for transfer and no c/o pain. Session focused on functional transfers to promote independence and exercises for NMR. Family returned pt to [facility], and pt was transferred to therapy gym for time conservation. Pt initiated session with dynamic sit balance testing in all 4 directions to assess safety, with pt only requiring CGA for L trunk lean. Pt then received education on slide board transfer. Pt was able to verbalize and demonstrate safe WC placement and safety for preparation of transfer. Pt then complete R slide board transfer with total A for slideboard placement + CGA to therapy mat. On mat, pt completed 2x8 glute bridges with AA on L side. Pt then completed ~45deg AROM of L hip abd between -10 to 30deg while in supine with assistance for leg placement and tactile feedback at L knee. Pt repeated exercise for hip add, with noted maximum of isometric contraction. Pt then returned to [facility] using slideboard + total A for slideboard placement + SBA, again repeating set up and safety instructions. Pt returned to room where transfer and bed exercises were demonstrated for family with same assist levels. Pt completed transfer in/out of bed to return to [facility] using slideboard + total A for slideboard placement + supervision for transfer. Family educated on need to practice with  therapy present before attempting to avoid risk of injury, and family agreeable.. At end of session, pt was left seated in [facility] with alarm engaged, call bell, and all needs in reach ? ?Therapy Documentation ?Precautions:  ?Precautions ?Precautions: Fall ?Precaution Comments: Flaccid Lt hemi, hx scoliosis, HTN ?Restrictions ?Weight Bearing Restrictions: No ? ? ? ?Therapy/Group: Individual Therapy ? ?Marquette Saa, PT, DPT ?04/27/2021, 12:04 PM  ?

## 2021-04-28 LAB — BASIC METABOLIC PANEL
Anion gap: 10 (ref 5–15)
BUN: 23 mg/dL — ABNORMAL HIGH (ref 6–20)
CO2: 22 mmol/L (ref 22–32)
Calcium: 9.1 mg/dL (ref 8.9–10.3)
Chloride: 107 mmol/L (ref 98–111)
Creatinine, Ser: 2.08 mg/dL — ABNORMAL HIGH (ref 0.61–1.24)
GFR, Estimated: 37 mL/min — ABNORMAL LOW (ref >60)
Glucose, Bld: 127 mg/dL — ABNORMAL HIGH (ref 70–99)
Potassium: 3.5 mmol/L (ref 3.5–5.1)
Sodium: 139 mmol/L (ref 135–145)

## 2021-04-28 LAB — CBC
HCT: 38.9 % — ABNORMAL LOW (ref 39.0–52.0)
Hemoglobin: 13.2 g/dL (ref 13.0–17.0)
MCH: 29.4 pg (ref 26.0–34.0)
MCHC: 33.9 g/dL (ref 30.0–36.0)
MCV: 86.6 fL (ref 80.0–100.0)
Platelets: 301 10*3/uL (ref 150–400)
RBC: 4.49 MIL/uL (ref 4.22–5.81)
RDW: 13 % (ref 11.5–15.5)
WBC: 11.2 10*3/uL — ABNORMAL HIGH (ref 4.0–10.5)
nRBC: 0 % (ref 0.0–0.2)

## 2021-04-28 LAB — URINALYSIS, ROUTINE W REFLEX MICROSCOPIC
Bilirubin Urine: NEGATIVE
Glucose, UA: NEGATIVE mg/dL
Hgb urine dipstick: NEGATIVE
Ketones, ur: NEGATIVE mg/dL
Leukocytes,Ua: NEGATIVE
Nitrite: NEGATIVE
Protein, ur: NEGATIVE mg/dL
Specific Gravity, Urine: 1.014 (ref 1.005–1.030)
pH: 5 (ref 5.0–8.0)

## 2021-04-28 MED ORDER — SORBITOL 70 % SOLN: 30.0000 mL | Freq: Once | Status: DC

## 2021-04-28 NOTE — Progress Notes (Signed)
Speech Language Pathology Daily Session Note ? ?Patient Details  ?Name: Miguel Mccullough ?MRN: [id number] ?Date of Birth: [birth year] ? ?Today's Date: 04/28/2021 ?SLP Individual Time: 3086-5784 ?SLP Individual Time Calculation (min): 40 min ? ?Short Term Goals: ?Week 2: SLP Short Term Goal 1 (Week 2): Patient will implement speech intelligibility strategies at the sentence level with sup A verbal cues when fatigued ?SLP Short Term Goal 2 (Week 2): Patient will consume current diet with implementation of safe swallowing compensatory strategies with mod I verbal cues ?SLP Short Term Goal 3 (Week 2): Pt will alternate attention between two tasks/concepts with min A verbal redirection ?SLP Short Term Goal 4 (Week 2): Pt will recall 3-4 details of relevant information after a delay with sup A verbal cues for use of memory strategies ?SLP Short Term Goal 5 (Week 2): Pt will complete complex problem solving tasks with min A verbal and visual cues ? ?Skilled Therapeutic Interventions: Skilled treatment session focused on cognitive goals. SLP facilitated session by providing extra time with overall Max A verbal cues for problem solving and error awareness and Min verbal cues for selective attention during a complex scheduling task in a mildly distracting environment.  Throughout session, patient was 100% intelligible and recalled events from previous therapy sessions with Min verbal cues. Patient left upright in recliner with alarm on and all needs within reach. Continue with current plan of care.  ?   ? ?Pain ?No/Denies Pain  ? ?Therapy/Group: Individual Therapy ? ?Arizbeth Cawthorn ?04/28/2021, 3:14 PM ?

## 2021-04-28 NOTE — Progress Notes (Signed)
Physical Therapy Session Note ? ?Patient Details  ?Name: Miguel Mccullough ?MRN: [id number] ?Date of Birth: 12-Aug-1965 ? ?Today's Date: 04/28/2021 ?PT Individual Time: 8403-3533 ?PT Individual Time Calculation (min): 88 min  ? ?Short Term Goals: ?Week 1:  PT Short Term Goal 1 (Week 1): Pt will perform bed to wc transfers to R w/min assist and light cues, to L w/mod to max assist of 1 ?PT Short Term Goal 1 - Progress (Week 1): Met ?PT Short Term Goal 2 (Week 1): Pt will sit statically w/feet supported w/cga ?PT Short Term Goal 2 - Progress (Week 1): Met ?PT Short Term Goal 3 (Week 1): Pt will ambulate up to 110f w/LRAD and 2 person assist ?PT Short Term Goal 3 - Progress (Week 1): Met ?PT Short Term Goal 4 (Week 1): Pt will propel wc 235fw/R exts and moderate cueing to attend to L environment. ?PT Short Term Goal 4 - Progress (Week 1): Met ? ?Skilled Therapeutic Interventions/Progress Updates:  ?  Pt received in recliner and agreeable to therapy.  No complaint of pain. slideboard transfer to w/c with min A to place board and complete transfer. Pt transported to therapy gym for time management and energy conservation.  ? ?Squat pivot to L side with CGA. Discussed benefits of slideboard vs squat pivot.  ?Pt performed 3 laps of long side of mat table ~8 ft scooting back and forth with assist to move LLE. VC to perform "mini stand" for more effective scooting with good carryover of instructions.  ? ?Pt then performed 4 x ~6 stands from high mat perched position to focus on terminal hip and knee extension. LLE knee block and multimodal cueing, UE support on therapists shoulder. Pt transitioning to using momentum independently for improved standing and mechanics. ? ?Transitioned to gait training with lite gait. Donned harness with stand in the same manner as above. Pt ambulated x 50 ff before donning Ace wrap DF assist, then 2 x 170 ft with it. Tot A for limb advancement, mod-max A for stance stability.  ? ?Pt returned to room  and performed mod A slideboard transfer d/t fatigue to return to recliner, was left with all needs in reach and alarm active.  ? ? ?Therapy Documentation ?Precautions:  ?Precautions ?Precautions: Fall ?Precaution Comments: Flaccid Lt hemi, hx scoliosis, HTN ?Restrictions ?Weight Bearing Restrictions: No ?General: ?  ? ? ? ? ?Therapy/Group: Individual Therapy ? ?OlMedicine Bow3/13/2023, 10:19 AM  ?

## 2021-04-28 NOTE — Progress Notes (Signed)
PROGRESS NOTE   Subjective/Complaints:  Pt reports slept fair due to racing thoughts.  LBM 3 days ago- wife asked for FMLA paperwork to be filled out.   No issues otherwise, except having some leukocytosis Is afebrile     ROS:   Pt denies SOB, abd pain, CP, N/V/(+) C/D, and vision changes       Objective:   No results found. Recent Labs    04/28/21 0826  WBC 11.2*  HGB 13.2  HCT 38.9*  PLT 301    Recent Labs    04/28/21 0826  NA 139  K 3.5  CL 107  CO2 22  GLUCOSE 127*  BUN 23*  CREATININE 2.08*  CALCIUM 9.1     Intake/Output Summary (Last 24 hours) at 04/28/2021 1113 Last data filed at 04/27/2021 1800 Gross per 24 hour  Intake 476 ml  Output --  Net 476 ml        Physical Exam: Vital Signs Blood pressure (!) 158/85, pulse 65, temperature 98.1 F (36.7 C), resp. rate 15, height 5\' 4"  (1.626 m), weight 75.9 kg, SpO2 94 %.        General: awake, alert, appropriate, in w/c heading to gym with PT;  NAD HENT: R gaze preference slightly improved;  oropharynx moist CV: regular rate; no JVD Pulmonary: CTA B/L; no W/R/R- good air movement GI: soft, NT, ND, (+)BS- hypoactive- protuberant not distended Psychiatric: appropriate- still flat Neurological: alert; more interactive Ext: no clubbing, cyanosis, or edema Musculoskeletal:     Cervical back: Neck supple. No tenderness.   0/5 on L side still Skin:    General: Skin is warm and dry.  Neurological:     Mental Status: He is alert and oriented to person, place, and time.     Comments: Mild left facial weakness with left inattention and mild left hemifacial sensory loss.  Speech clear and able to follow commands without difficulty. Dense left hemiplegia with sensory deficits. LUE /LLE 0/5 except for trace at foot. RUE and RLE 5/5. No motor/sensory changes today      Assessment/Plan: 1. Functional deficits which require 3+ hours per day  of interdisciplinary therapy in a comprehensive inpatient rehab setting. Physiatrist is providing close team supervision and 24 hour management of active medical problems listed below. Physiatrist and rehab team continue to assess barriers to discharge/monitor patient progress toward functional and medical goals  Care Tool:  Bathing    Body parts bathed by patient: Chest, Abdomen, Front perineal area, Right upper leg, Left upper leg, Face, Left arm, Right lower leg, Left lower leg   Body parts bathed by helper: Buttocks, Right arm, Right lower leg, Left lower leg     Bathing assist Assist Level: Maximal Assistance - Patient 24 - 49%     Upper Body Dressing/Undressing Upper body dressing   What is the patient wearing?: Pull over shirt    Upper body assist Assist Level: Maximal Assistance - Patient 25 - 49%    Lower Body Dressing/Undressing Lower body dressing      What is the patient wearing?: Pants, Underwear/pull up     Lower body assist Assist for lower body dressing: Total Assistance -  Patient < 25%     Toileting Toileting Toileting Activity did not occur Landscape architect and hygiene only): N/A (no void or bm)  Toileting assist Assist for toileting: 2 Helpers     Transfers Chair/bed transfer  Transfers assist     Chair/bed transfer assist level: Minimal Assistance - Patient > 75%     Locomotion Ambulation   Ambulation assist      Assist level: 2 helpers Assistive device: Lite Gait Max distance: 100 ft   Walk 10 feet activity   Assist  Walk 10 feet activity did not occur: Safety/medical concerns  Assist level: 2 helpers Assistive device: Lite Gait   Walk 50 feet activity   Assist Walk 50 feet with 2 turns activity did not occur: Safety/medical concerns  Assist level: 2 helpers Assistive device: Lite Gait    Walk 150 feet activity   Assist Walk 150 feet activity did not occur: Safety/medical concerns         Walk 10 feet on  uneven surface  activity   Assist Walk 10 feet on uneven surfaces activity did not occur: Safety/medical concerns         Wheelchair     Assist Is the patient using a wheelchair?: Yes Type of Wheelchair: Manual    Wheelchair assist level: Supervision/Verbal cueing Max wheelchair distance: 150 ft    Wheelchair 50 feet with 2 turns activity    Assist        Assist Level: Supervision/Verbal cueing   Wheelchair 150 feet activity     Assist      Assist Level: Supervision/Verbal cueing   Blood pressure (!) 158/85, pulse 65, temperature 98.1 F (36.7 C), resp. rate 15, height 5\' 4"  (1.626 m), weight 75.9 kg, SpO2 94 %.  Medical Problem List and Plan: 1. 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  Antithrombotics: -DVT/anticoagulation:  lovenox  -dopplers reviewed and negative             -antiplatelet therapy: N/A due to bleed.  3. Pain Management:: On tylenol as needed 4. Mood: LCSW to follow for evaluation an dsup             -antipsychotic agents: N/A 5. Neuropsych: This patient is capable of making decisions on his own behalf. 6. Skin/Wound Care: Routine pressure relief measures.  7. Fluids/Electrolytes/Nutrition: Monitor I/O. Check CMET in am.  8. HTN: goal to keep SBP< 160  -3/5 bp remains poorly controlled             -- Catapress 2 mg TID, metoprolol increased to 75 mg BID and Norvasc 10 qd.   -will begin scheduled hydralazine 50mg  q8  -will use addnl hydralazine prn   3/6- just added hydralazine- BP 165/98 this AM- will monitor and follow trend  3/9- BP 150s/99- will increase hydralazine to 100 mg TID  3/10- just increased-hydralazine-  BP 150s/90s- wait to let it kick in 3/13- BP a little elevated, but better- con't regimen 9.  Persistent hypokalemia: Start Kdur 20 meq daily. Check Mg level  3/3- Will replete 40 mEq x2 and recheck Monday- last Mg on 2/28 was 2.3  3/6- K+  up to 3.6- con't to monitor  3/8- labs in AM  3/9- Mg 1.8 and K+ 3.5- will con't to monitor  3/12- labs Monday   3/16- K+ 3.5- low normal, will con't to follow/monitor 10.  Severe LVH: Has been referred to cardiology for work up. Continue metoprolol bid.  11. 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  --Renal ultrasound with mild evidence of medical renal disease             --encourage fluid intake. 3/3- down slightly to 2.21- will avoid nephrotoxic agents 3/9- Renal U/S normal- Cr up slightly to 1.99 and BUN 25- think it's leveling out- will con't to monitor 3/13- Cr up slightly more to 2.08- and BUN 23- will recheck in Am and push fluids- also, might have infection/UTI which could affect Cr slightly? 12, Elevated AST: recheck in am. 13. Hyperlipidemia: Continue Lipitor.  14. ABLA: Likely due to bleed with drop from 14.4-->12.6.              --monitor for signs of bleeding. Recheck CBC Monday   3/6- Hb up to 13.7- doing better even with improvement in BUN/Cr.  15. L neglect and lethargy-pt appears more alert  -monitor for need of stimulant 16. Noncompliance- hadn't taken BP meds in months per pt.  17/ Sedation during day with insomnia at night  3/6- will add trazodone 75 mg QHS and add Amantadine for wakefulness during day.   3/7- slept better last night- might increase amantadine in 2-3 days.   3/8- much more alert with amantadine and better sleep at night- con't regimen  3/9- much more awake 18. Constipation  3/7- LBM 3 days ago- will give sorbitol if no BM by 3pm. 3/13- Sorbitol today since has been 3 days 19. Depression  3/9- will add Celexa 20 mg daily for mood- since works faster.  20. Headaches  3/9- tylneol helps- but if continues til next week, will try adding Elavil QHS for HA  prevention.   3/10- pt wants ot wait on Elavil for now  3/12- no more HA's since Friday 21. Leukocytosis  3/13- WBC 11.2- slightly elevate-d no fever- will check U/A and Cx to make sure no UTI- if negative, and labs tomorrow are still up, will con't more work up  I spent a total of  51  minutes on total care today- >50% coordination of care- due to d/w nursing as well as filling out FMLA paperwork with wife.         LOS: 11 days A FACE TO FACE EVALUATION WAS PERFORMED  Meta Kroenke 04/28/2021, 11:13 AM

## 2021-04-28 NOTE — Progress Notes (Signed)
Occupational Therapy Session Note ? ?Patient Details  ?Name: Miguel Mccullough ?MRN: [id number] ?Date of Birth: 04-24-1965 ? ?Today's Date: 04/28/2021 ?OT Individual Time: 8828-0034 ?OT Individual Time Calculation (min): 43 min  ? ? ?Short Term Goals: ?Week 1:  OT Short Term Goal 1 (Week 1): Pt will thread Lt UE into shirt with Mod A and vcs to improve Lt attention ?OT Short Term Goal 1 - Progress (Week 1): Progressing toward goal ?OT Short Term Goal 2 (Week 1): Pt will verbalize 2 joint protection strategies to use during bed mobility and/or transfers to improve Lt attention ?OT Short Term Goal 2 - Progress (Week 1): Progressing toward goal ?OT Short Term Goal 3 (Week 1): Education will be provided to pts wife in regards to 2 hemi positioning strategies to implement at home for caregiver education ?OT Short Term Goal 3 - Progress (Week 1): Met ?Week 2:  OT Short Term Goal 1 (Week 2): Pt will verbalize 2 joint protection strategies to use during bed mobility and/or transfers to improve Lt attention ?OT Short Term Goal 2 (Week 2): Pt will thread Lt UE into shirt with Mod A and vcs to improve Lt attention ?OT Short Term Goal 3 (Week 2): Pt will perform AAROM/PROM HEP for LUE with no more than MIN verbal cues ?OT Short Term Goal 4 (Week 2): Pt will perform sit <> stand with LRAD with Mod A of 1 in prep for LB ADL ? ?Skilled Therapeutic Interventions/Progress Updates:  ?Patient met seated in recliner in agreement with OT treatment session. 0/10 pain reported at rest and with activity. Slideboard transfer to wc with Min-Mod A. 2/3 grooming tasks seated at sink level with Max A for incorporation of affected LUE with cues for attention to L. Wc mobility to nurses station with cues for hemi technique and attention to L. In [facility] rehab gym focus shifted to functional transfers including slideboard transfer to mat table, sit to stand transfers with +2 assist and L knee block and lateral scoots with focus on orientation to midline and L  attention. Attempted to get patient in quadruped but unable. Wc transport back to room and slideboard transfer to recliner in same manner as noted above. Call bell within reach,  all needs met and wife present at bedside.  ? ? ?Therapy Documentation ?Precautions:  ?Precautions ?Precautions: Fall ?Precaution Comments: Flaccid Lt hemi, hx scoliosis, HTN ?Restrictions ?Weight Bearing Restrictions: No ?General: ?  ? ?Therapy/Group: Individual Therapy ? ?Maximus Hoffert R Howerton-Davis ?04/28/2021, 6:51 AM ?

## 2021-04-29 LAB — BASIC METABOLIC PANEL
Anion gap: 6 (ref 5–15)
BUN: 23 mg/dL — ABNORMAL HIGH (ref 6–20)
CO2: 25 mmol/L (ref 22–32)
Calcium: 8.8 mg/dL — ABNORMAL LOW (ref 8.9–10.3)
Chloride: 109 mmol/L (ref 98–111)
Creatinine, Ser: 2.12 mg/dL — ABNORMAL HIGH (ref 0.61–1.24)
GFR, Estimated: 37 mL/min — ABNORMAL LOW (ref >60)
Glucose, Bld: 83 mg/dL (ref 70–99)
Potassium: 3.7 mmol/L (ref 3.5–5.1)
Sodium: 140 mmol/L (ref 135–145)

## 2021-04-29 LAB — CBC WITH DIFFERENTIAL/PLATELET
Abs Immature Granulocytes: 0.04 10*3/uL (ref 0.00–0.07)
Basophils Absolute: 0.1 10*3/uL (ref 0.0–0.1)
Basophils Relative: 1 %
Eosinophils Absolute: 0.4 10*3/uL (ref 0.0–0.5)
Eosinophils Relative: 4 %
HCT: 35.1 % — ABNORMAL LOW (ref 39.0–52.0)
Hemoglobin: 11.7 g/dL — ABNORMAL LOW (ref 13.0–17.0)
Immature Granulocytes: 0 %
Lymphocytes Relative: 19 %
Lymphs Abs: 1.8 10*3/uL (ref 0.7–4.0)
MCH: 28.9 pg (ref 26.0–34.0)
MCHC: 33.3 g/dL (ref 30.0–36.0)
MCV: 86.7 fL (ref 80.0–100.0)
Monocytes Absolute: 0.6 10*3/uL (ref 0.1–1.0)
Monocytes Relative: 6 %
Neutro Abs: 6.6 10*3/uL (ref 1.7–7.7)
Neutrophils Relative %: 70 %
Platelets: 261 10*3/uL (ref 150–400)
RBC: 4.05 MIL/uL — ABNORMAL LOW (ref 4.22–5.81)
RDW: 13.1 % (ref 11.5–15.5)
WBC: 9.5 10*3/uL (ref 4.0–10.5)
nRBC: 0 % (ref 0.0–0.2)

## 2021-04-29 MED ORDER — AMANTADINE HCL 100 MG PO CAPS
200.0000 mg | ORAL_CAPSULE | Freq: Every day | ORAL | Status: DC
Start: 2021-04-30 — End: 2021-04-29

## 2021-04-29 MED ORDER — AMANTADINE HCL 100 MG PO CAPS
100.0000 mg | ORAL_CAPSULE | Freq: Every day | ORAL | Status: DC
  Administered 2021-04-30 – 2021-05-15 (×16): 100 mg via ORAL
  Filled 2021-04-29 (×16): qty 1

## 2021-04-29 NOTE — Progress Notes (Signed)
Speech Language Pathology Daily Session Note ? ?Patient Details  ?Name: Miguel Mccullough ?MRN: [id number] ?Date of Birth: 24-Feb-1965 ? ?Today's Date: 04/29/2021 ?SLP Individual Time: 4707-6151 ?SLP Individual Time Calculation (min): 41 min ? ?Short Term Goals: ?Week 2: SLP Short Term Goal 1 (Week 2): Patient will implement speech intelligibility strategies at the sentence level with sup A verbal cues when fatigued ?SLP Short Term Goal 2 (Week 2): Patient will consume current diet with implementation of safe swallowing compensatory strategies with mod I verbal cues ?SLP Short Term Goal 3 (Week 2): Pt will alternate attention between two tasks/concepts with min A verbal redirection ?SLP Short Term Goal 4 (Week 2): Pt will recall 3-4 details of relevant information after a delay with sup A verbal cues for use of memory strategies ?SLP Short Term Goal 5 (Week 2): Pt will complete complex problem solving tasks with min A verbal and visual cues ? ?Skilled Therapeutic Interventions:Skilled ST services focused on cognitive skills. Pt was greatly impacted by fatigue and drifting off to sleep (eye fluttering closed.) SLP provided H20 recall sheet, to record daily H2) consumption. Pt completed cancellation task to address left side neglect, pt required min A fade to supervision A verbal cues for error awareness with majority of errors made on the left side verse right side. However, errors appeared more due to cognition than left side neglect. SLP facilitated familiar complex problem solving, error awareness, sustained attention and recall in scheduling task previously completed by pt. Pt required max A verbal cues due to fading alertness, during periods of alertness pt required mod A verbal cues. Pt was left in room with call bell within reach and chair alarm set. SLP recommends to continue skilled services. ?   ? ?Pain ?Pain Assessment ?Pain Score: 0-No pain ? ?Therapy/Group: Individual Therapy ? ?Caron Tardif ?04/29/2021, 3:33  PM ?

## 2021-04-29 NOTE — Progress Notes (Signed)
Physical Therapy Session Note ? ?Patient Details  ?Name: Miguel Mccullough ?MRN: [id number] ?Date of Birth: [birth year] ? ?Today's Date: 04/29/2021 ?PT Individual Time: 6387-5643 ?PT Individual Time Calculation (min): 82 min  ? ?Short Term Goals: ?Week 1:  PT Short Term Goal 1 (Week 1): Pt will perform bed to wc transfers to R w/min assist and light cues, to L w/mod to max assist of 1 ?PT Short Term Goal 1 - Progress (Week 1): Met ?PT Short Term Goal 2 (Week 1): Pt will sit statically w/feet supported w/cga ?PT Short Term Goal 2 - Progress (Week 1): Met ?PT Short Term Goal 3 (Week 1): Pt will ambulate up to 22f w/LRAD and 2 person assist ?PT Short Term Goal 3 - Progress (Week 1): Met ?PT Short Term Goal 4 (Week 1): Pt will propel wc 262fw/R exts and moderate cueing to attend to L environment. ?PT Short Term Goal 4 - Progress (Week 1): Met ? ?Skilled Therapeutic Interventions/Progress Updates:  ?  Pt received in recliner and agreeable to therapy.  No complaint of pain.  ? ?Pt propelled w/c with hemi technique 2 x 50-100 ft with assist to navigate elevator. Pt transported remaining distance for time management.  ? ?Transfers: ?Pt performed squat pivot recliner<>w/c with light min A, clearing buttocks completely.  ?Sit to stand with min A and knee block to don/doff lite gait harness ? ?Gait training: ?Pt performed gait training with lite gait 2 x 300 ft and x 150 ft with +2 assist for managing equipment Givmohr sling and DF assist wrap in place. Tot A for limb advancement and stance stability. Noted inconsistent hip extension stability.  ? ?During last bout of gait, noted worsening clonus and spasticity. Consulted MD, who discussed medication options with plan to follow up during morning rounds. Returned to room and provided education on spasticity to pt's wife, and informed her of planned follow up. Pt returned to recliner and remained with his wife present, was left with all needs in reach and alarm active.  ? ?Therapy  Documentation ?Precautions:  ?Precautions ?Precautions: Fall ?Precaution Comments: Flaccid Lt hemi, hx scoliosis, HTN ?Restrictions ?Weight Bearing Restrictions: No ? ? ? ? ?Therapy/Group: Individual Therapy ? ?OlSugarloaf3/14/2023, 3:04 PM  ?

## 2021-04-29 NOTE — Progress Notes (Addendum)
Occupational Therapy Session Note ? ?Patient Details  ?Name: Miguel Mccullough ?MRN: [id number] ?Date of Birth: January 30, 1966 ? ?Today's Date: 04/29/2021 ?OT Individual Time: 4782-9562 ?OT Individual Time Calculation (min): 73 min  ? ? ?Short Term Goals: ?Week 1:  OT Short Term Goal 1 (Week 1): Pt will thread Lt UE into shirt with Mod A and vcs to improve Lt attention ?OT Short Term Goal 1 - Progress (Week 1): Progressing toward goal ?OT Short Term Goal 2 (Week 1): Pt will verbalize 2 joint protection strategies to use during bed mobility and/or transfers to improve Lt attention ?OT Short Term Goal 2 - Progress (Week 1): Progressing toward goal ?OT Short Term Goal 3 (Week 1): Education will be provided to pts wife in regards to 2 hemi positioning strategies to implement at home for caregiver education ?OT Short Term Goal 3 - Progress (Week 1): Met ?Week 2:  OT Short Term Goal 1 (Week 2): Pt will verbalize 2 joint protection strategies to use during bed mobility and/or transfers to improve Lt attention ?OT Short Term Goal 2 (Week 2): Pt will thread Lt UE into shirt with Mod A and vcs to improve Lt attention ?OT Short Term Goal 3 (Week 2): Pt will perform AAROM/PROM HEP for LUE with no more than MIN verbal cues ?OT Short Term Goal 4 (Week 2): Pt will perform sit <> stand with LRAD with Mod A of 1 in prep for LB ADL ? ?Skilled Therapeutic Interventions/Progress Updates:  ?Patient met seated in recliner in agreement with OT treatment session. 0/10 pain reported at rest and with activity. Session with focus on self-care re-education, functional transfers and NMR as detailed below. Able to self-propel to rehab gym with cues for attention to L. Patient bumping into walls on L several times with decreased awareness. Several slideboard transfers with Min-CGA throughout session. Once seated on mat table focus shifted to LUE NMR with NMES. Tolerated 10 minutes on  wrist extensors and 10 minutes on wrist flexors with incorporation of  functional activity during both (grasping/releasing cones while NMES on flexors with wrist block and wrist extensors with 1lb weighted dowel while NMES on extensors). Focus shifted to sit to stand transfers with givmohr sling and Min-Mod A and use of hemi walker. Attempted lateral steps with Max A at LLE and visual feedback from mirror. Returned to room in same manner as indicated above. Session concluded with patient seated in wc with call bell within reach, chair alarm activated and all needs met.  ? ?NMES: No pain reported prior to start or at end of session. Skin intact before and after without s/s redness/skin breakdown.  ? ?Ratio 1:3 ?Rate 35 pps ?Waveform- Asymmetric ?Ramp 1.0 ?Pulse 300 ?Intensity- 41 ?Duration -   10 minutes ? ? ?Therapy Documentation ?Precautions:  ?Precautions ?Precautions: Fall ?Precaution Comments: Flaccid Lt hemi, hx scoliosis, HTN ?Restrictions ?Weight Bearing Restrictions: No ?General: ?  ?Therapy/Group: Individual Therapy ? ?Michaelle Bottomley R Howerton-Davis ?04/29/2021, 6:49 AM ?

## 2021-04-29 NOTE — Progress Notes (Signed)
Patient ID: Miguel Mccullough, male   DOB: May 17, 1965, 56 y.o.   MRN: [id number]  Met with to and wife who is present to discuss team conference progress this week in therapies and target still 3/30. He is very pleased with his progress and hopeful this will continue. His wife is here daily and participates in his therapies when team feels appropriate. MD has completed wife's FMLA papers and wife has them. Pt aware needs to drink more for kidney function. Will continue to work on discharge needs. ?

## 2021-04-29 NOTE — Progress Notes (Signed)
?                                                       PROGRESS NOTE ? ? ?Subjective/Complaints: ? ?Pt reports feeling good;  ?Slept well- Admits only drinking ~ 3-4 cups of water/day- explained needs to drink 6-8 cups/day- ?Progressing in therapy per pt/wife.  ?No Sx's of UTI- U/A (-).  ? ? ?ROS:  ? ?Pt denies SOB, abd pain, CP, N/V/C/D, and vision changes ? ? ? ? ? ? ?Objective: ?  ?No results found. ?Recent Labs  ?  04/28/21 ?0826 04/29/21 ?7253  ?WBC 11.2* 9.5  ?HGB 13.2 11.7*  ?HCT 38.9* 35.1*  ?PLT 301 261  ? ? ?Recent Labs  ?  04/28/21 ?0826 04/29/21 ?6644  ?NA 139 140  ?K 3.5 3.7  ?CL 107 109  ?CO2 22 25  ?GLUCOSE 127* 83  ?BUN 23* 23*  ?CREATININE 2.08* 2.12*  ?CALCIUM 9.1 8.8*  ? ? ? ?Intake/Output Summary (Last 24 hours) at 04/29/2021 0850 ?Last data filed at 04/29/2021 0758 ?Gross per 24 hour  ?Intake 720 ml  ?Output 600 ml  ?Net 120 ml  ?  ? ?  ? ?Physical Exam: ?Vital Signs ?Blood pressure 138/83, pulse 65, temperature 97.9 ?F (36.6 ?C), temperature source Oral, resp. rate 16, height 5\' 4"  (1.626 m), weight 75.9 kg, SpO2 99 %. ? ? ? ? ? ? ? ? ?General: awake, alert, appropriate, sitting up in bed; wife at bedside; wearing EG's;  NAD ?HENT: conjugate gaze; oropharynx dry ?CV: regular rate; no JVD ?Pulmonary: CTA B/L; no W/R/R- good air movement ?GI: soft, NT, ND, (+)BS ?Psychiatric: appropriate- less flat; more interactive ?Neurological: alert- more eye contact ?Ext: no clubbing, cyanosis, or edema- has (+) tenting! ?Musculoskeletal:  ?   Cervical back: Neck supple. No tenderness.  ? 0/5 on L side still ?Skin: ?   General: Skin is warm and dry.  ?Neurological:  ?   Mental Status: He is alert and oriented to person, place, and time.  ?   Comments: Mild left facial weakness with left inattention and mild left hemifacial sensory loss.  Speech clear and able to follow commands without difficulty. Dense left hemiplegia with sensory deficits. LUE /LLE 0/5 except for trace at foot. RUE and RLE 5/5. No  motor/sensory changes today ?  ?  ? ?Assessment/Plan: ?1. Functional deficits which require 3+ hours per day of interdisciplinary therapy in a comprehensive inpatient rehab setting. ?Physiatrist is providing close team supervision and 24 hour management of active medical problems listed below. ?Physiatrist and rehab team continue to assess barriers to discharge/monitor patient progress toward functional and medical goals ? ?Care Tool: ? ?Bathing ?   ?Body parts bathed by patient: Chest, Abdomen, Front perineal area, Right upper leg, Left upper leg, Face, Left arm, Right lower leg, Left lower leg  ? Body parts bathed by helper: Buttocks, Right arm, Right lower leg, Left lower leg ?  ?  ?Bathing assist Assist Level: Maximal Assistance - Patient 24 - 49% ?  ?  ?Upper Body Dressing/Undressing ?Upper body dressing   ?What is the patient wearing?: Pull over shirt ?   ?Upper body assist Assist Level: Maximal Assistance - Patient 25 - 49% ?   ?Lower Body Dressing/Undressing ?Lower body dressing ? ? ?   ?What is the patient  wearing?: Pants, Underwear/pull up ? ?  ? ?Lower body assist Assist for lower body dressing: Total Assistance - Patient < 25% ?   ? ?Toileting ?Toileting Toileting Activity did not occur (Probation officer and hygiene only): N/A (no void or bm)  ?Toileting assist Assist for toileting: 2 Helpers ?  ?  ?Transfers ?Chair/bed transfer ? ?Transfers assist ?   ? ?Chair/bed transfer assist level: Minimal Assistance - Patient > 75% ?  ?  ?Locomotion ?Ambulation ? ? ?Ambulation assist ? ?   ? ?Assist level: 2 helpers ?Assistive device: Lite Gait ?Max distance: 100 ft  ? ?Walk 10 feet activity ? ? ?Assist ? Walk 10 feet activity did not occur: Safety/medical concerns ? ?Assist level: 2 helpers ?Assistive device: Lite Gait  ? ?Walk 50 feet activity ? ? ?Assist Walk 50 feet with 2 turns activity did not occur: Safety/medical concerns ? ?Assist level: 2 helpers ?Assistive device: Lite Gait  ? ? ?Walk 150 feet  activity ? ? ?Assist Walk 150 feet activity did not occur: Safety/medical concerns ? ?  ?  ?  ? ?Walk 10 feet on uneven surface  ?activity ? ? ?Assist Walk 10 feet on uneven surfaces activity did not occur: Safety/medical concerns ? ? ?  ?   ? ?Wheelchair ? ? ? ? ?Assist Is the patient using a wheelchair?: Yes ?Type of Wheelchair: Manual ?  ? ?Wheelchair assist level: Supervision/Verbal cueing ?Max wheelchair distance: 150 ft  ? ? ?Wheelchair 50 feet with 2 turns activity ? ? ? ?Assist ? ?  ?  ? ? ?Assist Level: Supervision/Verbal cueing  ? ?Wheelchair 150 feet activity  ? ? ? ?Assist ?   ? ? ?Assist Level: Supervision/Verbal cueing  ? ?Blood pressure 138/83, pulse 65, temperature 97.9 ?F (36.6 ?C), temperature source Oral, resp. rate 16, height 5\' 4"  (1.626 m), weight 75.9 kg, SpO2 99 %. ? ?Medical Problem List and Plan: ?1. 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            -patient may  shower ?            -ELOS/Goals: 14-20 days- min A to supervision ? D/c date 3/30 ? Con't CIR- PT, OT and SLP- team conference today to check on progress ?2.  Antithrombotics: ?-DVT/anticoagulation:  lovenox ? -dopplers reviewed and negative ?            -antiplatelet therapy: N/A due to bleed.  ?3. Pain Management:: On tylenol as needed ?4. Mood: LCSW to follow for evaluation an dsup ?            -antipsychotic agents: N/A ?5. Neuropsych: This patient is capable of making decisions on his own behalf. ?6. Skin/Wound Care: Routine pressure relief measures.  ?7. Fluids/Electrolytes/Nutrition: Monitor I/O. Check CMET in am.  ?8. HTN: goal to keep SBP< 160 ? -3/5 bp remains poorly controlled ?            -- Catapress 2 mg TID, metoprolol increased to 75 mg BID and Norvasc 10 qd. ?  -will begin scheduled hydralazine 50mg  q8 ? -will use addnl hydralazine prn  ? 3/6- just added hydralazine- BP 165/98 this AM- will monitor and follow trend  ?3/9- BP 150s/99- will increase hydralazine to 100  mg TID ? 3/10- just increased-hydralazine-  BP 150s/90s- wait to let it kick in ?3/14- BP controlled- con't regimen ?9. Persistent hypokalemia: Start Kdur 20 meq daily. Check Mg level ? 3/3-  Will replete 40 mEq x2 and recheck Monday- last Mg on 2/28 was 2.3 ? 3/6- K+ up to 3.6- con't to monitor ? 3/8- labs in AM ? 3/9- Mg 1.8 and K+ 3.5- will con't to monitor ? 3/12- labs Monday  ? 3/14- K+ 3.7- con't to monitor ?10.  Severe LVH: Has been referred to cardiology for work up. Continue metoprolol bid.  ?11. 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  ?--Renal ultrasound with mild evidence of medical renal disease ?            --encourage fluid intake. ?3/3- down slightly to 2.21- will avoid nephrotoxic agents ?3/9- Renal U/S normal- Cr up slightly to 1.99 and BUN 25- think it's leveling out- will con't to monitor ?3/13- Cr up slightly more to 2.08- and BUN 23- will recheck in Am and push fluids- also, might have infection/UTI which could affect Cr slightly? ? 3/14- U/A (-) and Cr up again to 2.12 and BUN 23- has (+) tenting- asked pt to drink 6-8 cups/day and will recheck Thursday ?12, Elevated AST: recheck in am. ?13. Hyperlipidemia: Continue Lipitor.  ?14. ABLA: Likely due to bleed with drop from 14.4-->12.6.  ?            --monitor for signs of bleeding. Recheck CBC Monday  ? 3/6- Hb up to 13.7- doing better even with improvement in BUN/Cr.  ?15. L neglect and lethargy-pt appears more alert ? -monitor for need of stimulant ?16. Noncompliance- hadn't taken BP meds in months per pt.  ?17/ Sedation during day with insomnia at night ? 3/6- will add trazodone 75 mg QHS and add Amantadine for wakefulness during day.  ? 3/7- slept better last night- might increase amantadine in 2-3 days.  ? 3/8- much more alert with amantadine and better sleep at night- con't regimen ? 3/9- much more awake ?18. Constipation ? 3/7- LBM 3 days ago- will give sorbitol if no BM by 3pm. ?3/13- Sorbitol  today since has been 3 days ? 3/14- LBM yesterday Am- before sorbitol ?19. Depression ? 3/9- will add Celexa 20 mg daily for mood- since works faster.  ?20. Headaches ? 3/9- tylneol helps- but if continues til next w

## 2021-04-29 NOTE — Patient Care Conference (Signed)
Inpatient RehabilitationTeam Conference and Plan of Care Update ?Date: 04/29/2021   Time: 11:51 AM  ? ? ?Patient Name: Miguel Mccullough      ?Medical Record Number: [id number]  ?Date of Birth: 11-24-1965 ?Sex: Male         ?Room/Bed: 5C07C/5C07C-01 ?Payor Info: Payor: /   ? ?Admit Date/Time:  04/17/2021  5:15 PM ? ?Primary Diagnosis:  ICH (intracerebral hemorrhage) ([place]) ? ?Hospital Problems: Principal Problem: ?  ICH (intracerebral hemorrhage) ([place]) ? ? ? ?Expected Discharge Date: Expected Discharge Date: 05/15/21 ? ?Team Members Present: ?Physician leading conference: Dr. Courtney Heys ?Social Worker Present: Ovidio Kin, LCSW ?Nurse Present: Dorthula Nettles, RN ?PT Present: Ailene Rud, PT ?OT Present: Turner Daniels, OT ?SLP Present: Charolett Bumpers, SLP ?PPS Coordinator present : Gunnar Fusi, SLP ? ?   Current Status/Progress Goal Weekly Team Focus  ?Bowel/Bladder ? ? cont of b/b, lbm 04/28/2021  remain cont of b/b  assess q shift & prn   ?Swallow/Nutrition/ Hydration ? ? regular textures and liquids, supervision A - mod I  mod I  swallow strategies   ?ADL's ? ? Wife assists with bathing/dressing likely Mod-Max A overall; CGA slideboard transfers, Min A STS with givmohr sling and hemi-walker  Min A overall  Functional transfers, sitting/standing balance, slideboard transfers, self-ROM ed, family ed.   ?Mobility ? ? min-CGA squat pivot, +2 gait 170 ft,  min A, supervision w/c  gait training, transfers, standing, w/c mobility   ?Communication ? ? supervision A - Mod I  mod I  speech intelligibility strategies in conversation   ?Safety/Cognition/ Behavioral Observations ? min-supervision A recall, mod-max A higher level cognitive tasks  Mod I - Supervision A  alternating attention, recall strategies and complex problem solving   ?Pain ? ? 0 out of 10  remain 0/10  assess q shift & prn   ?Skin ? ? no skin impairments  remain free from skin impairments  assess q shift & prn   ? ? ?Discharge Planning:  ?Wife  here daily and has observed husband in therapies, very supportive and involved. Pt making progress in therapies. Gotten new PCP appointment for follow up   ?Team Discussion: ?Cr rising, WBC's elevated. Push fluids. More alert. Recheck labs Thursday. Awaiting UC results. Continent B/B, no reported pain, sleeping better. Has scoliosis at baseline. Wife here daily and very supportive. New PCP appointment for follow-up. ? ?Patient on target to meet rehab goals: ?yes, min assist goals. Ambulated 170 ft in [place]. Pain in shoulder joint. Requesting Givmohr sling. Shoulder may be beginning to sublex. Will begin Estim. Wife completing B&D at night. Total assist to left. On-going Neuromuscular education. Much improvement with SLP. Min assist to supervision with cognition. SLP will assess for left neglect. ? ?*See Care Plan and progress notes for long and short-term goals.  ? ?Revisions to Treatment Plan:  ?Adjusting medications, monitoring labs. ?  ?Teaching Needs: ?Family education, medication/pain management, safety awareness, transfer/gait training, etc. ?  ?Current Barriers to Discharge: ?Decreased caregiver support and Lack of/limited family support ? ?Possible Resolutions to Barriers: ?Family education ?Follow-up OT/PT/SLP ?Order recommended DME ?  ? ? Medical Summary ?Current Status: continent- sleeping better- no constipation- no skin issues- alertness better with amantadine- ? Barriers to Discharge: Home enviroment access/layout;Medical stability;Weight;Weight bearing restrictions ? Barriers to Discharge Comments: L hemiplegia; L neglect- a little pain with L shoulder- subluxation? uninsured- ?Possible Resolutions to [employer]: will do estim for L shoulder issues; Givmohr sling? see if can get one for pt-super  motivated- NM education- to do estim- - stil mod=-max complex probl solving- L neglect- still notable- focus on that- has scoliosis at baseline- d/c 3/30 ? ? ?Continued Need for Acute  Rehabilitation Level of Care: The patient requires daily medical management by a physician with specialized training in physical medicine and rehabilitation for the following reasons: ?Direction of a multidisciplinary physical rehabilitation program to maximize functional independence : Yes ?Medical management of patient stability for increased activity during participation in an intensive rehabilitation regime.: Yes ?Analysis of laboratory values and/or radiology reports with any subsequent need for medication adjustment and/or medical intervention. : Yes ? ? ?I attest that I was present, lead the team conference, and concur with the assessment and plan of the team. ? ? ?Dorthula Nettles G ?04/29/2021, 4:35 PM  ? ? ? ? ? ? ?

## 2021-04-30 LAB — URINE CULTURE: Culture: <10000 — AB

## 2021-04-30 MED ORDER — BACLOFEN 5 MG HALF TABLET
5.0000 mg | ORAL_TABLET | Freq: Three times a day (TID) | ORAL | Status: DC
  Administered 2021-04-30 – 2021-05-09 (×28): 5 mg via ORAL
  Filled 2021-04-30 (×29): qty 1

## 2021-04-30 MED ORDER — METHYLPHENIDATE HCL 5 MG PO TABS
2.5000 mg | ORAL_TABLET | Freq: Every day | ORAL | Status: DC
  Administered 2021-04-30 – 2021-05-14 (×15): 2.5 mg via ORAL
  Filled 2021-04-30 (×15): qty 1

## 2021-04-30 NOTE — Progress Notes (Signed)
Occupational Therapy Session Note ? ?Patient Details  ?Name: Miguel Mccullough ?MRN: [id number] ?Date of Birth: 13-Oct-1965 ? ?Today's Date: 04/30/2021 ?OT Individual Time: 5883-2549 ?OT Individual Time Calculation (min): 58 min  ? ? ?Short Term Goals: ?Week 1:  OT Short Term Goal 1 (Week 1): Pt will thread Lt UE into shirt with Mod A and vcs to improve Lt attention ?OT Short Term Goal 1 - Progress (Week 1): Progressing toward goal ?OT Short Term Goal 2 (Week 1): Pt will verbalize 2 joint protection strategies to use during bed mobility and/or transfers to improve Lt attention ?OT Short Term Goal 2 - Progress (Week 1): Progressing toward goal ?OT Short Term Goal 3 (Week 1): Education will be provided to pts wife in regards to 2 hemi positioning strategies to implement at home for caregiver education ?OT Short Term Goal 3 - Progress (Week 1): Met ?Week 2:  OT Short Term Goal 1 (Week 2): Pt will verbalize 2 joint protection strategies to use during bed mobility and/or transfers to improve Lt attention ?OT Short Term Goal 2 (Week 2): Pt will thread Lt UE into shirt with Mod A and vcs to improve Lt attention ?OT Short Term Goal 3 (Week 2): Pt will perform AAROM/PROM HEP for LUE with no more than MIN verbal cues ?OT Short Term Goal 4 (Week 2): Pt will perform sit <> stand with LRAD with Mod A of 1 in prep for LB ADL ? ?Skilled Therapeutic Interventions/Progress Updates:  ?Patient met seated in wc in agreement with OT treatment session. 0/10 pain reported at rest and with activity. Slideboard transfer to wc from recliner with CGA after placement of board. Wc mobility to [facility] elevators with improved attention to L. Total A for wc transport the rest of the way to rehab gym on 4th floor. Slideboard transfer to mat table in same manner as listed above. Focus shifted to LUE NMR with NMES. Please refer below for additional information. RLE HEP with 4lb ankle weight for 3 sets x10 reps each of LAQ's, straight leg raises and seated marches.  Blocked practice for sit to stands x5 trials with R knee block and assist to extend L hip/knee. Session concluded with patient seated in wc with call bell within reach and all needs met. Wife present at bedside.  ? ?1:1 NMES applied to supraspinatus and middle deltoid to help approximate shoulder joint to reduce risk of subluxation.  ? ?Ratio 1:3 ?Rate 35 pps ?Waveform- Asymmetric ?Ramp 1.0 ?Pulse 300 ?Intensity- 35 ?Duration -   20 minutes ? ?Therapy Documentation ?Precautions:  ?Precautions ?Precautions: Fall ?Precaution Comments: Flaccid Lt hemi, hx scoliosis, HTN ?Restrictions ?Weight Bearing Restrictions: No ?General: ?  ? ?Therapy/Group: Individual Therapy ? ?Zarek Relph R Howerton-Davis ?04/30/2021, 6:42 AM ?

## 2021-04-30 NOTE — Progress Notes (Signed)
Physical Therapy Session Note ? ?Patient Details  ?Name: Miguel Mccullough ?MRN: [id number] ?Date of Birth: [birth year] ? ?Today's Date: 04/30/2021 ?PT Individual Time: 3729-0211 ?PT Individual Time Calculation (min): 83 min  ? ?Short Term Goals: ?Week 1:  PT Short Term Goal 1 (Week 1): Pt will perform bed to wc transfers to R w/min assist and light cues, to L w/mod to max assist of 1 ?PT Short Term Goal 1 - Progress (Week 1): Met ?PT Short Term Goal 2 (Week 1): Pt will sit statically w/feet supported w/cga ?PT Short Term Goal 2 - Progress (Week 1): Met ?PT Short Term Goal 3 (Week 1): Pt will ambulate up to 73f w/LRAD and 2 person assist ?PT Short Term Goal 3 - Progress (Week 1): Met ?PT Short Term Goal 4 (Week 1): Pt will propel wc 229fw/R exts and moderate cueing to attend to L environment. ?PT Short Term Goal 4 - Progress (Week 1): Met ?Week 2:  PT Short Term Goal 1 (Week 2): Pt will self propel w/c with hemi technique with supervision and no cues ?PT Short Term Goal 2 (Week 2): Pt will initiate standing transfer training ?PT Short Term Goal 3 (Week 2): Pt will complete bed mobility with supervision ? ?Skilled Therapeutic Interventions/Progress Updates:  ?  Pt seated in w/c on arrival and agreeable to therapy. No complaint of pain. Pt propelled w/c with R hemi technique x 200 ft, including navigating elevator with min cueing while backing up. Pt then transported remaining distance for energy conservation. Pt ambulated 2 x 150 ft with leg lifter, DF assist wrap, Givmohr sling, and lite gait. Pt required max A to advance limb, but was able to maintain single leg stance on LLE first ~80 ft of each bout, tot A to maintain after this. Pt fatigued quickly this AM, possibly d/t increased activity during gait. Pt then performed squat pivot >mat table 2" taller than chair with CGA. Pt performed squats with knee block and RUE support on chair, with visual feed back from mirror for upright trunk, 3 x 5. Transitioned to UE work d/t  fatigue. Sit<>sidelying with min A for LLE, then returned to sitting and performed lateral push ups with assist to maintain LUE position, 3 x 6. Squat pivot mat>w/c>recliner with CGA toward R side. Pt returned to room and to recliner, was left with all needs in reach and alarm active.  ? ?Therapy Documentation ?Precautions:  ?Precautions ?Precautions: Fall ?Precaution Comments: Flaccid Lt hemi, hx scoliosis, HTN ?Restrictions ?Weight Bearing Restrictions: No ?General: ?  ? ? ? ? ?Therapy/Group: Individual Therapy ? ?[place]3/15/2023, 12:23 PM  ?

## 2021-04-30 NOTE — Progress Notes (Addendum)
Physical Therapy Session Note ? ?Patient Details  ?Name: Miguel Mccullough ?MRN: [id number] ?Date of Birth: April 23, 1965 ? ?Today's Date: 04/30/2021 ?PT Individual Time: 1924-3836 ?PT Individual Time Calculation (min): 63 min  ? ?Short Term Goals: ?Week 2:  PT Short Term Goal 1 (Week 2): Pt will self propel w/c with hemi technique with supervision and no cues ?PT Short Term Goal 2 (Week 2): Pt will initiate standing transfer training ?PT Short Term Goal 3 (Week 2): Pt will complete bed mobility with supervision ? ?Skilled Therapeutic Interventions/Progress Updates: Pt presented in w/c with wife present agreeable to therapy. Pt denies pain during session. Pt propelled to elevators with supervision via hemi technique. PTA transported pt remaining distance for energy conservation. Pt then participated in gait training at wall 30 ft x2. PTA placed ace bandage and leg loops for LLE management. Pt was able to perform stand with minA although mostly used RLE and RUE. Pt was able to initially advance LLE with adduction noted and PTA able to correct footing and block L knee. Pt was able to initiate quad activation to extend knee however due to only trace hamstring some hyperextension noted. On second bout pt with increased fatigue requiring PTA to advance LLE 100% however pt was able to activate quad in stance phase. PTA also facilitated weight shifting to allow advancement of LE and increase weight bearing on L side. Pt then transported to mat and performed squat pivot to mat with minA. Performed sit to supine via sidelying with modA. In supine PTA placed bolster behind knees and pt performed modified bridge 2 x 10 for glute activation. Pt encouraged to push into bolster through knees to encourage increased recruitment of L glute. Pt then rolled to R with minA and use of powder board for AA hip flexion, hip extension, knee flexion/extension x 10 ea. Pt returned to sitting with min/modA and performed squat pivot to return to w/c in  same manner as prior. Pt transported back to room at end of session and remained in w/c with wife present  and needs met.  ?   ? ?Therapy Documentation ?Precautions:  ?Precautions ?Precautions: Fall ?Precaution Comments: Flaccid Lt hemi, hx scoliosis, HTN ?Restrictions ?Weight Bearing Restrictions: No ?General: ?  ?Vital Signs: ? ?Pain: ?  ?Mobility: ?  ?Locomotion : ?   ?Trunk/Postural Assessment : ?   ?Balance: ?  ?Exercises: ?  ?Other Treatments:   ? ? ? ?Therapy/Group: Individual Therapy ? ?Mickey Hebel ?04/30/2021, 4:02 PM  ?

## 2021-04-30 NOTE — Plan of Care (Signed)
?  Problem: Consults ?Goal: RH STROKE PATIENT EDUCATION ?Description: See Patient Education module for education specifics  ?Outcome: Progressing ?  ?Problem: RH SAFETY ?Goal: RH STG ADHERE TO SAFETY PRECAUTIONS W/ASSISTANCE/DEVICE ?Description: STG Adhere to Safety Precautions With Cues and Reminders. ?Outcome: Progressing ?Goal: RH STG DECREASED RISK OF FALL WITH ASSISTANCE ?Description: STG Decreased Risk of Fall With [employer]. ?Outcome: Progressing ?  ?Problem: RH COGNITION-NURSING ?Goal: RH STG ANTICIPATES NEEDS/CALLS FOR ASSIST W/ASSIST/CUES ?Description: STG Anticipates Needs/Calls for Assist With Cues and Reminders. ?Outcome: Progressing ?  ?Problem: RH KNOWLEDGE DEFICIT ?Goal: RH STG INCREASE KNOWLEDGE OF HYPERTENSION ?Description: Patient will demonstrate knowledge of HTN medications, dietary restrictions, and BP parameters with educational materials and handouts provided by staff independently at discharge. ?Outcome: Progressing ?Goal: RH STG INCREASE KNOWLEGDE OF HYPERLIPIDEMIA ?Description: Patient will demonstrate knowledge of HLD medications, dietary restrictions, and important lab values with educational materials and handouts provided by staff independently at discharge. ?Outcome: Progressing ?  ?

## 2021-04-30 NOTE — Progress Notes (Signed)
?                                                       PROGRESS NOTE ? ? ?Subjective/Complaints: ? ?Pt reports is awake in AM, but by the time SLP is to be done, very lethargic/sleepy- tired by end of day.  ? ?Notes spasticity- is bothersome but not painful.  ? ?Explained cannot increase Amantadine due to renal function- we discussed options of Dantrolene vs baclofen for spasticity-  ?Also will try Tiny dose of Ritalin in afternoon to help with sedation at end of day.  ? ?ROS:  ? ?Pt denies SOB, abd pain, CP, N/V/C/D, and vision changes ? ? ? ? ? ? ? ?Objective: ?  ?No results found. ?Recent Labs  ?  04/28/21 ?0826 04/29/21 ?1610  ?WBC 11.2* 9.5  ?HGB 13.2 11.7*  ?HCT 38.9* 35.1*  ?PLT 301 261  ? ? ?Recent Labs  ?  04/28/21 ?0826 04/29/21 ?9604  ?NA 139 140  ?K 3.5 3.7  ?CL 107 109  ?CO2 22 25  ?GLUCOSE 127* 83  ?BUN 23* 23*  ?CREATININE 2.08* 2.12*  ?CALCIUM 9.1 8.8*  ? ? ? ?Intake/Output Summary (Last 24 hours) at 04/30/2021 0825 ?Last data filed at 04/30/2021 0749 ?Gross per 24 hour  ?Intake 580 ml  ?Output --  ?Net 580 ml  ?  ? ?  ? ?Physical Exam: ?Vital Signs ?Blood pressure (!) 146/81, pulse 71, temperature 98.6 ?F (37 ?C), resp. rate 16, height 5\' 4"  (1.626 m), weight 75.9 kg, SpO2 94 %. ? ? ? ? ? ? ? ? ? ?General: awake, alert, appropriate, sitting up in bedside chair; wife at bedside; NAD ?HENT: conjugate gaze; oropharynx moist ?CV: regular rate; no JVD ?Pulmonary: CTA B/L; no W/R/R- good air movement ?GI: soft, NT, ND, (+)BS ?Psychiatric: appropriate ?Neurological: more alert- higher level cognition slowing- but better ?MAS of 1+ to 2 in LUE- and MAS of 1 to 1+ in LLE- hoffman's (+) LUE and sustained clonus in LLE ?Ext: no clubbing, cyanosis, or edema- has (+) tenting! ?Musculoskeletal:  ?   Cervical back: Neck supple. No tenderness.  ? 0/5 on L side still ?Skin: ?   General: Skin is warm and dry.  ?Neurological:  ?   Mental Status: He is alert and oriented to person, place, and time.  ?   Comments: Mild  left facial weakness with left inattention and mild left hemifacial sensory loss.  Speech clear and able to follow commands without difficulty. Dense left hemiplegia with sensory deficits. LUE /LLE 0/5 except for trace at foot. RUE and RLE 5/5. No motor/sensory changes today ?  ?  ? ?Assessment/Plan: ?1. Functional deficits which require 3+ hours per day of interdisciplinary therapy in a comprehensive inpatient rehab setting. ?Physiatrist is providing close team supervision and 24 hour management of active medical problems listed below. ?Physiatrist and rehab team continue to assess barriers to discharge/monitor patient progress toward functional and medical goals ? ?Care Tool: ? ?Bathing ?   ?Body parts bathed by patient: Chest, Abdomen, Front perineal area, Right upper leg, Left upper leg, Face, Left arm, Right lower leg, Left lower leg  ? Body parts bathed by helper: Buttocks, Right arm, Right lower leg, Left lower leg ?  ?  ?Bathing assist Assist Level: Maximal Assistance - Patient 24 -  49% ?  ?  ?Upper Body Dressing/Undressing ?Upper body dressing   ?What is the patient wearing?: Pull over shirt ?   ?Upper body assist Assist Level: Maximal Assistance - Patient 25 - 49% ?   ?Lower Body Dressing/Undressing ?Lower body dressing ? ? ?   ?What is the patient wearing?: Pants, Underwear/pull up ? ?  ? ?Lower body assist Assist for lower body dressing: Total Assistance - Patient < 25% ?   ? ?Toileting ?Toileting Toileting Activity did not occur (Probation officer and hygiene only): N/A (no void or bm)  ?Toileting assist Assist for toileting: 2 Helpers ?  ?  ?Transfers ?Chair/bed transfer ? ?Transfers assist ?   ? ?Chair/bed transfer assist level: Minimal Assistance - Patient > 75% ?  ?  ?Locomotion ?Ambulation ? ? ?Ambulation assist ? ?   ? ?Assist level: 2 helpers ?Assistive device: Lite Gait ?Max distance: 100 ft  ? ?Walk 10 feet activity ? ? ?Assist ? Walk 10 feet activity did not occur: Safety/medical  concerns ? ?Assist level: 2 helpers ?Assistive device: Lite Gait  ? ?Walk 50 feet activity ? ? ?Assist Walk 50 feet with 2 turns activity did not occur: Safety/medical concerns ? ?Assist level: 2 helpers ?Assistive device: Lite Gait  ? ? ?Walk 150 feet activity ? ? ?Assist Walk 150 feet activity did not occur: Safety/medical concerns ? ?  ?  ?  ? ?Walk 10 feet on uneven surface  ?activity ? ? ?Assist Walk 10 feet on uneven surfaces activity did not occur: Safety/medical concerns ? ? ?  ?   ? ?Wheelchair ? ? ? ? ?Assist Is the patient using a wheelchair?: Yes ?Type of Wheelchair: Manual ?  ? ?Wheelchair assist level: Supervision/Verbal cueing ?Max wheelchair distance: 150 ft  ? ? ?Wheelchair 50 feet with 2 turns activity ? ? ? ?Assist ? ?  ?  ? ? ?Assist Level: Supervision/Verbal cueing  ? ?Wheelchair 150 feet activity  ? ? ? ?Assist ?   ? ? ?Assist Level: Supervision/Verbal cueing  ? ?Blood pressure (!) 146/81, pulse 71, temperature 98.6 ?F (37 ?C), resp. rate 16, height 5\' 4"  (1.626 m), weight 75.9 kg, SpO2 94 %. ? ?Medical Problem List and Plan: ?1. 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            -patient may  shower ?            -ELOS/Goals: 14-20 days- min A to supervision ? D/c date 3/30 ? Con't CIR- PT, OT and SLP- get Givmohr brace? ?2.  Antithrombotics: ?-DVT/anticoagulation:  lovenox ? -dopplers reviewed and negative ?            -antiplatelet therapy: N/A due to bleed.  ?3. Pain Management:: On tylenol as needed ?4. Mood: LCSW to follow for evaluation an dsup ?            -antipsychotic agents: N/A ?5. Neuropsych: This patient is capable of making decisions on his own behalf. ?6. Skin/Wound Care: Routine pressure relief measures.  ?7. Fluids/Electrolytes/Nutrition: Monitor I/O. Check CMET in am.  ?8. HTN: goal to keep SBP< 160 ? -3/5 bp remains poorly controlled ?            -- Catapress 2 mg TID, metoprolol increased to 75 mg BID and Norvasc 10 qd. ?   -will begin scheduled hydralazine 50mg  q8 ? -will use addnl hydralazine prn  ? 3/6- just added hydralazine- BP 165/98 this  AM- will monitor and follow trend  ?3/9- BP 150s/99- will increase hydralazine to 100 mg TID ? 3/10- just increased-hydralazine-  BP 150s/90s- wait to let it kick in ?3/15- BP slightly elevated, but doing much better- con't regimen ?9. Persistent hypokalemia: Start Kdur 20 meq daily. Check Mg level ? 3/3- Will replete 40 mEq x2 and recheck Monday- last Mg on 2/28 was 2.3 ? 3/6- K+ up to 3.6- con't to monitor ? 3/8- labs in AM ? 3/9- Mg 1.8 and K+ 3.5- will con't to monitor ? 3/12- labs Monday  ? 3/14- K+ 3.7- con't to monitor ?10.  Severe LVH: Has been referred to cardiology for work up. Continue metoprolol bid.  ?11. 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  ?--Renal ultrasound with mild evidence of medical renal disease ?            --encourage fluid intake. ?3/3- down slightly to 2.21- will avoid nephrotoxic agents ?3/9- Renal U/S normal- Cr up slightly to 1.99 and BUN 25- think it's leveling out- will con't to monitor ?3/13- Cr up slightly more to 2.08- and BUN 23- will recheck in Am and push fluids- also, might have infection/UTI which could affect Cr slightly? ? 3/14- U/A (-) and Cr up again to 2.12 and BUN 23- has (+) tenting- asked pt to drink 6-8 cups/day and will recheck Thursday ?12, Elevated AST: recheck in am. ?13. Hyperlipidemia: Continue Lipitor.  ?14. ABLA: Likely due to bleed with drop from 14.4-->12.6.  ?            --monitor for signs of bleeding. Recheck CBC Monday  ? 3/6- Hb up to 13.7- doing better even with improvement in BUN/Cr.  ?15. L neglect and lethargy-pt appears more alert ? -monitor for need of stimulant ? 3/15- will add Ritalin 2.5 mg qnoon to help with PM sedation- due to BP issues, don't want high dose- cannot increase Amantadine due to renal function.  ?16. Noncompliance- hadn't taken BP meds in months per pt.  ?17/  Sedation during day with insomnia at night ? 3/6- will add trazodone 75 mg QHS and add Amantadine for wakefulness during day.  ? 3/7- slept better last night- might increase amantadine in 2-3 days.  ? 3/8- much more a

## 2021-05-01 LAB — BASIC METABOLIC PANEL
Anion gap: 7 (ref 5–15)
BUN: 21 mg/dL — ABNORMAL HIGH (ref 6–20)
CO2: 26 mmol/L (ref 22–32)
Calcium: 8.9 mg/dL (ref 8.9–10.3)
Chloride: 106 mmol/L (ref 98–111)
Creatinine, Ser: 1.95 mg/dL — ABNORMAL HIGH (ref 0.61–1.24)
GFR, Estimated: 40 mL/min — ABNORMAL LOW (ref >60)
Glucose, Bld: 84 mg/dL (ref 70–99)
Potassium: 3.5 mmol/L (ref 3.5–5.1)
Sodium: 139 mmol/L (ref 135–145)

## 2021-05-01 NOTE — Progress Notes (Signed)
Speech Language Pathology Daily Session Note ? ?Patient Details  ?Name: Miguel Mccullough ?MRN: [id number] ?Date of Birth: 07/21/1965 ? ?Today's Date: 05/01/2021 ?SLP Individual Time: 4765-4650 ?SLP Individual Time Calculation (min): 45 min ? ?Short Term Goals: ?Week 2: SLP Short Term Goal 1 (Week 2): Patient will implement speech intelligibility strategies at the sentence level with sup A verbal cues when fatigued ?SLP Short Term Goal 2 (Week 2): Patient will consume current diet with implementation of safe swallowing compensatory strategies with mod I verbal cues ?SLP Short Term Goal 3 (Week 2): Pt will alternate attention between two tasks/concepts with min A verbal redirection ?SLP Short Term Goal 4 (Week 2): Pt will recall 3-4 details of relevant information after a delay with sup A verbal cues for use of memory strategies ?SLP Short Term Goal 5 (Week 2): Pt will complete complex problem solving tasks with min A verbal and visual cues ? ?Skilled Therapeutic Interventions: ?Pt seen this date for skilled ST intervention targeting cognitive-linguistic goals outlined above. Pt alert/awake and OOB in recliner chair. Spouse present and then left when ST session began. Pt agreeable to ST intervention at the bedside. ? ?SLP facilitated today's session by provided verbal and written education and functional examples of compensatory (internal) memory strategies. Pt provided his own scenarios of instances when he could implement each strategy (e.g. WARM strategies) following verbal model from SLP. During conversational task, SLP provided model prompts for implementation of memory strategies during functional task. Following education and prompting, pt successfully recalled 5 details from the conversation after a five-minute delay with distraction. Additionally, pt completed a functional written sequencing task + self-correction with 100% accuracy without prompts.   ? ?At the end of the session, pt left in recliner chair with  chair alarm donned. RN came in at this time to provide medications. Call bell and all needs met. Continue to recommend ST intervention. ? ?Pain ?Pain Assessment ?Pain Scale: 0-10 ?Pain Score: 0-No pain ? ?Therapy/Group: Individual Therapy ? ?Kierah Goatley A Razia Screws ?05/01/2021, 1:00 PM ?

## 2021-05-01 NOTE — Progress Notes (Signed)
?                                                       PROGRESS NOTE ? ? ?Subjective/Complaints: ? ? ?Pt reports spasms/tightness were worse yesterday, however just started baclofen. No side effects so far.  ?Also, more awake til ~ 5-6pm yesterday afternoon with Ritalin.  ? ?Also discussed brain breaks. And how they help brain recover better and function better ? ?ROS:  ? ? ?Pt denies SOB, abd pain, CP, N/V/C/D, and vision changes ? ? ? ? ? ? ? ? ?Objective: ?  ?No results found. ?Recent Labs  ?  04/29/21 ?6644  ?WBC 9.5  ?HGB 11.7*  ?HCT 35.1*  ?PLT 261  ? ? ?Recent Labs  ?  04/29/21 ?0347 05/01/21 ?4259  ?NA 140 139  ?K 3.7 3.5  ?CL 109 106  ?CO2 25 26  ?GLUCOSE 83 84  ?BUN 23* 21*  ?CREATININE 2.12* 1.95*  ?CALCIUM 8.8* 8.9  ? ? ? ?Intake/Output Summary (Last 24 hours) at 05/01/2021 0912 ?Last data filed at 05/01/2021 5638 ?Gross per 24 hour  ?Intake 960 ml  ?Output --  ?Net 960 ml  ?  ? ?  ? ?Physical Exam: ?Vital Signs ?Blood pressure (!) 144/85, pulse 65, temperature 97.9 ?F (36.6 ?C), temperature source Oral, resp. rate 18, height 5\' 4"  (1.626 m), weight 75.9 kg, SpO2 100 %. ? ? ? ? ? ? ? ? ? ? ?General: awake, alert, appropriate, sitting up in bedside chair; wife in room; NAD ?HENT: conjugate gaze; oropharynx moist ?CV: regular rate; no JVD ?Pulmonary: CTA B/L; no W/R/R- good air movement ?GI: soft, NT, ND, (+)BS ?Psychiatric: appropriate ?Neurological: alert- R gaze preference is MUCH better- can look left without cues; hoffmans' (+) ?Ext: no clubbing, cyanosis, or edema- has (+) tenting! ?Musculoskeletal:  ?   Cervical back: Neck supple. No tenderness.  ? 0/5 on L side still ?Skin: ?   General: Skin is warm and dry.  ?Neurological:  ?   Mental Status: He is alert and oriented to person, place, and time.  ?   Comments: Mild left facial weakness with left inattention and mild left hemifacial sensory loss.  Speech clear and able to follow commands without difficulty. Dense left hemiplegia with sensory  deficits. LUE /LLE 0/5 except for trace at foot. RUE and RLE 5/5. No motor/sensory changes today ?  ?  ? ?Assessment/Plan: ?1. Functional deficits which require 3+ hours per day of interdisciplinary therapy in a comprehensive inpatient rehab setting. ?Physiatrist is providing close team supervision and 24 hour management of active medical problems listed below. ?Physiatrist and rehab team continue to assess barriers to discharge/monitor patient progress toward functional and medical goals ? ?Care Tool: ? ?Bathing ?   ?Body parts bathed by patient: Chest, Abdomen, Front perineal area, Right upper leg, Left upper leg, Face, Left arm, Right lower leg, Left lower leg  ? Body parts bathed by helper: Buttocks, Right arm, Right lower leg, Left lower leg ?  ?  ?Bathing assist Assist Level: Maximal Assistance - Patient 24 - 49% ?  ?  ?Upper Body Dressing/Undressing ?Upper body dressing   ?What is the patient wearing?: Pull over shirt ?   ?Upper body assist Assist Level: Maximal Assistance - Patient 25 - 49% ?   ?Lower Body Dressing/Undressing ?Lower  body dressing ? ? ?   ?What is the patient wearing?: Pants, Underwear/pull up ? ?  ? ?Lower body assist Assist for lower body dressing: Total Assistance - Patient < 25% ?   ? ?Toileting ?Toileting Toileting Activity did not occur (Probation officer and hygiene only): N/A (no void or bm)  ?Toileting assist Assist for toileting: 2 Helpers ?  ?  ?Transfers ?Chair/bed transfer ? ?Transfers assist ?   ? ?Chair/bed transfer assist level: Minimal Assistance - Patient > 75% ?  ?  ?Locomotion ?Ambulation ? ? ?Ambulation assist ? ?   ? ?Assist level: 2 helpers ?Assistive device: Lite Gait ?Max distance: 100 ft  ? ?Walk 10 feet activity ? ? ?Assist ? Walk 10 feet activity did not occur: Safety/medical concerns ? ?Assist level: 2 helpers ?Assistive device: Lite Gait  ? ?Walk 50 feet activity ? ? ?Assist Walk 50 feet with 2 turns activity did not occur: Safety/medical concerns ? ?Assist  level: 2 helpers ?Assistive device: Lite Gait  ? ? ?Walk 150 feet activity ? ? ?Assist Walk 150 feet activity did not occur: Safety/medical concerns ? ?  ?  ?  ? ?Walk 10 feet on uneven surface  ?activity ? ? ?Assist Walk 10 feet on uneven surfaces activity did not occur: Safety/medical concerns ? ? ?  ?   ? ?Wheelchair ? ? ? ? ?Assist Is the patient using a wheelchair?: Yes ?Type of Wheelchair: Manual ?  ? ?Wheelchair assist level: Supervision/Verbal cueing ?Max wheelchair distance: 150 ft  ? ? ?Wheelchair 50 feet with 2 turns activity ? ? ? ?Assist ? ?  ?  ? ? ?Assist Level: Supervision/Verbal cueing  ? ?Wheelchair 150 feet activity  ? ? ? ?Assist ?   ? ? ?Assist Level: Supervision/Verbal cueing  ? ?Blood pressure (!) 144/85, pulse 65, temperature 97.9 ?F (36.6 ?C), temperature source Oral, resp. rate 18, height 5\' 4"  (1.626 m), weight 75.9 kg, SpO2 100 %. ? ?Medical Problem List and Plan: ?1. 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            -patient may  shower ?            -ELOS/Goals: 14-20 days- min A to supervision ? D/c date 3/30 ? Con't CIR- PT OT and SLP ?2.  Antithrombotics: ?-DVT/anticoagulation:  lovenox ? -dopplers reviewed and negative ?            -antiplatelet therapy: N/A due to bleed.  ?3. Pain Management:: On tylenol as needed ?4. Mood: LCSW to follow for evaluation an dsup ?            -antipsychotic agents: N/A ?5. Neuropsych: This patient is capable of making decisions on his own behalf. ?6. Skin/Wound Care: Routine pressure relief measures.  ?7. Fluids/Electrolytes/Nutrition: Monitor I/O. Check CMET in am.  ?8. HTN: goal to keep SBP< 160 ? -3/5 bp remains poorly controlled ?            -- Catapress 2 mg TID, metoprolol increased to 75 mg BID and Norvasc 10 qd. ?  -will begin scheduled hydralazine 50mg  q8 ? -will use addnl hydralazine prn  ? 3/6- just added hydralazine- BP 165/98 this AM- will monitor and follow trend  ?3/9- BP 150s/99- will  increase hydralazine to 100 mg TID ? 3/10- just increased-hydralazine-  BP 150s/90s- wait to let it kick in ?3/15- BP slightly elevated, but doing much better- con't regimen ? 3/16- BP  running 140s/80s- pulse in 60s so cannot increase B blocker-  ?9. Persistent hypokalemia: Start Kdur 20 meq daily. Check Mg level ? 3/3- Will replete 40 mEq x2 and recheck Monday- last Mg on 2/28 was 2.3 ? 3/6- K+ up to 3.6- con't to monitor ? 3/8- labs in AM ? 3/9- Mg 1.8 and K+ 3.5- will con't to monitor ? 3/12- labs Monday  ? 3/14- K+ 3.7- con't to monitor ? 3/16- K+ 3.5- still in normal range, although low- will ask pt to eat bananas, etc ?10.  Severe LVH: Has been referred to cardiology for work up. Continue metoprolol bid.  ?11. 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  ?--Renal ultrasound with mild evidence of medical renal disease ?            --encourage fluid intake. ?3/3- down slightly to 2.21- will avoid nephrotoxic agents ?3/9- Renal U/S normal- Cr up slightly to 1.99 and BUN 25- think it's leveling out- will con't to monitor ?3/13- Cr up slightly more to 2.08- and BUN 23- will recheck in Am and push fluids- also, might have infection/UTI which could affect Cr slightly? ? 3/14- U/A (-) and Cr up again to 2.12 and BUN 23- has (+) tenting- asked pt to drink 6-8 cups/day and will recheck Thursday ?3/16- Cr up to 1.95 and BUN 21- doing much better when drinks- con't regimen ?12, Elevated AST: recheck in am. ?13. Hyperlipidemia: Continue Lipitor.  ?14. ABLA: Likely due to bleed with drop from 14.4-->12.6.  ?            --monitor for signs of bleeding. Recheck CBC Monday  ? 3/6- Hb up to 13.7- doing better even with improvement in BUN/Cr.  ?15. L neglect and lethargy-pt appears more alert ? -monitor for need of stimulant ? 3/15- will add Ritalin 2.5 mg qnoon to help with PM sedation- due to BP issues, don't want high dose- cannot increase Amantadine due to renal function.  ? 3/16- much  improved yesterday afternoon- will con't ritalin daily here, however at home; will have pt use prn in afternoon- also educate don brain breaks 15-60 minutes resting- no TV ?16. Noncompliance- hadn't taken BP meds

## 2021-05-01 NOTE — Progress Notes (Signed)
Physical Therapy Session Note ? ?Patient Details  ?Name: Miguel Mccullough ?MRN: [id number] ?Date of Birth: 1966-02-13 ? ?Today's Date: 05/01/2021 ?PT Individual Time: 0910-1005 ?PT Individual Time Calculation (min): 55 min  ? ?Short Term Goals: ? ?Week 2:  PT Short Term Goal 1 (Week 2): Pt will self propel w/c with hemi technique with supervision and no cues ?PT Short Term Goal 2 (Week 2): Pt will initiate standing transfer training ?PT Short Term Goal 3 (Week 2): Pt will complete bed mobility with supervision ? ? ?Skilled Therapeutic Interventions/Progress Updates:  ? ?Pt received sitting in WC and agreeable to PT ? ?Slide board transfers with min assist once board in place to and from recliner with cues for improved LLE position.   ? ?Gait training at rail in hall + 2 for safety and DF wrap x 29ft with mod assist in standing for improved step width. No knee instability noted on this trrial.  ? ?Gait training with [place] walker 48ft +56ft with DF wrap and +2 for safety mod-moax assist from PT for improved step length/width on the LLE as well as max cues for activation of knee extension with WB through the LLE.  ? ?Sit<>stand with min assist from PT throughout session x 5 with cues for improved RUE placement to push from [facility]  ? ?Kinetron reciprocal movement training with min assist on the LLE to improve hip flexion throughout 3 x 21min with therapeutic rest break between bouts.  ? ? ?   ? ?Therapy Documentation ?Precautions:  ?Precautions ?Precautions: Fall ?Precaution Comments: Flaccid Lt hemi, hx scoliosis, HTN ?Restrictions ?Weight Bearing Restrictions: No ? ?Vital Signs: ?Oxygen Therapy ?O2 Device: Room Air ?Pain: ?Pain Assessment ?Pain Scale: 0-10 ?Pain Score: 0-No pain ? ? ?Therapy/Group: Individual Therapy ? ?Lorie Phenix ?05/01/2021, 10:46 AM  ?

## 2021-05-01 NOTE — Plan of Care (Signed)
?  Problem: Consults ?Goal: RH STROKE PATIENT EDUCATION ?Description: See Patient Education module for education specifics  ?Outcome: Progressing ?  ?Problem: RH SAFETY ?Goal: RH STG ADHERE TO SAFETY PRECAUTIONS W/ASSISTANCE/DEVICE ?Description: STG Adhere to Safety Precautions With Cues and Reminders. ?Outcome: Progressing ?Goal: RH STG DECREASED RISK OF FALL WITH ASSISTANCE ?Description: STG Decreased Risk of Fall With [employer]. ?Outcome: Progressing ?  ?Problem: RH COGNITION-NURSING ?Goal: RH STG ANTICIPATES NEEDS/CALLS FOR ASSIST W/ASSIST/CUES ?Description: STG Anticipates Needs/Calls for Assist With Cues and Reminders. ?Outcome: Progressing ?  ?Problem: RH KNOWLEDGE DEFICIT ?Goal: RH STG INCREASE KNOWLEDGE OF HYPERTENSION ?Description: Patient will demonstrate knowledge of HTN medications, dietary restrictions, and BP parameters with educational materials and handouts provided by staff independently at discharge. ?Outcome: Progressing ?Goal: RH STG INCREASE KNOWLEGDE OF HYPERLIPIDEMIA ?Description: Patient will demonstrate knowledge of HLD medications, dietary restrictions, and important lab values with educational materials and handouts provided by staff independently at discharge. ?Outcome: Progressing ?  ?

## 2021-05-01 NOTE — Progress Notes (Signed)
Physical Therapy Session Note ? ?Patient Details  ?Name: Miguel Mccullough ?MRN: [id number] ?Date of Birth: [birth year] ? ?Today's Date: 05/01/2021 ?PT Individual Time: 4098-1191 ?PT Individual Time Calculation (min): 58 min  ? ?Short Term Goals: ?Week 2:  PT Short Term Goal 1 (Week 2): Pt will self propel w/c with hemi technique with supervision and no cues ?PT Short Term Goal 2 (Week 2): Pt will initiate standing transfer training ?PT Short Term Goal 3 (Week 2): Pt will complete bed mobility with supervision ? ?Skilled Therapeutic Interventions/Progress Updates: Pt presents asleep semi-reclined in recliner.  Pt agreeable to therapy.  Pt performed squat pivot recliner to w/c to L w/ min A.  Pt wheeled to dayroom for use of Lite Gait.  Pt stood w/ min A and HHA for placement of harness.  L hand ace  wrapped to handle.  Pt amb x 35' w/ mod A for weight shift to R for LLE advancement.  Ace wrap utilized for DF assist on L foot, but decreased foot clearance.  Pt sat in w/c and leg lifter retrieved for use w/ gait for LLE.  Pt amb 150' w/ Lite Gait and mod to min A w/ improved weight shift and min A for advancing LLE.  Pt required blocking of L knee w/ weight acceptance to LLE.  Pt returned to room and performed step-pivot w/c > recliner to R and PT blocking of L knee.  Pt reclined in recliner w/ seat alarm on and all needs in reach. ?   ? ?Therapy Documentation ?Precautions:  ?Precautions ?Precautions: Fall ?Precaution Comments: Flaccid Lt hemi, hx scoliosis, HTN ?Restrictions ?Weight Bearing Restrictions: No ?General: ?  ?Vital Signs: ?Therapy Vitals ?Temp: 97.8 ?F (36.6 ?C) ?Temp Source: Oral ?Pulse Rate: (!) 59 ?Resp: 18 ?BP: 138/79 ?Patient Position (if appropriate): Sitting ?Oxygen Therapy ?SpO2: 100 % ?O2 Device: Room Air ?Pain:0/10 ?  ? ? ? ?Therapy/Group: Individual Therapy ? ?[name] ?05/01/2021, 3:52 PM  ?

## 2021-05-01 NOTE — Progress Notes (Signed)
Occupational Therapy Session Note ? ?Patient Details  ?Name: Miguel Mccullough ?MRN: [id number] ?Date of Birth: 09-17-1965 ? ?Today's Date: 05/01/2021 ?OT Individual Time: 1443-1540 ?OT Individual Time Calculation (min): 50 min  ? ? ?Short Term Goals: ?Week 1:  OT Short Term Goal 1 (Week 1): Pt will thread Lt UE into shirt with Mod A and vcs to improve Lt attention ?OT Short Term Goal 1 - Progress (Week 1): Progressing toward goal ?OT Short Term Goal 2 (Week 1): Pt will verbalize 2 joint protection strategies to use during bed mobility and/or transfers to improve Lt attention ?OT Short Term Goal 2 - Progress (Week 1): Progressing toward goal ?OT Short Term Goal 3 (Week 1): Education will be provided to pts wife in regards to 2 hemi positioning strategies to implement at home for caregiver education ?OT Short Term Goal 3 - Progress (Week 1): Met ?Week 2:  OT Short Term Goal 1 (Week 2): Pt will verbalize 2 joint protection strategies to use during bed mobility and/or transfers to improve Lt attention ?OT Short Term Goal 2 (Week 2): Pt will thread Lt UE into shirt with Mod A and vcs to improve Lt attention ?OT Short Term Goal 3 (Week 2): Pt will perform AAROM/PROM HEP for LUE with no more than MIN verbal cues ?OT Short Term Goal 4 (Week 2): Pt will perform sit <> stand with LRAD with Mod A of 1 in prep for LB ADL ? ?Skilled Therapeutic Interventions/Progress Updates:  ?Patient met seated in recliner in agreement with OT treatment session. 0/10 pain reported at rest and with activity. Completed several slideboard transfers throughout session with CGA overall after placement of bard. Total A for wc transport to rehab gym for time management. Sit to stand from wc with Min A +2 for safety without AD. Attempted to get patient in tall kneeling position on mat table but patient reporting stretching like pain in L hamstring. Returned to [employer] with Min A + 2 for safety. NMES applied to L triceps to promote elbow extension to improve bed  mobility and functional transfers. Please refer below for additional details. NMES combined with elbow extension with patient transitioning from weight on L elbow to pushing trunk upright. External assist required to maintain position of LUE. Returned to room in same manner as detailed above. Session concluded with patient seated in recliner with call bell within reach, belt alarm activated and all needs met.  ? ?1:1 NMES ?Ratio 1:3 ?Rate 35 pps ?Waveform- Asymmetric ?Ramp 1.0 ?Pulse 300 ?Intensity- 35 ?Duration -   20 min ? ? ?Therapy Documentation ?Precautions:  ?Precautions ?Precautions: Fall ?Precaution Comments: Flaccid Lt hemi, hx scoliosis, HTN ?Restrictions ?Weight Bearing Restrictions: No ?General: ?  ?Therapy/Group: Individual Therapy ? ?Miguel Mccullough ?05/01/2021, 12:39 PM ?

## 2021-05-02 NOTE — Progress Notes (Signed)
Occupational Therapy Session Note ? ?Patient Details  ?Name: Miguel Mccullough ?MRN: [id number] ?Date of Birth: 05/16/1965 ? ?Today's Date: 05/02/2021 ?OT Individual Time: 5436-0677 ?OT Individual Time Calculation (min): 48 min  ? ? ?Short Term Goals: ?Week 2:  OT Short Term Goal 1 (Week 2): Pt will verbalize 2 joint protection strategies to use during bed mobility and/or transfers to improve Lt attention ?OT Short Term Goal 2 (Week 2): Pt will thread Lt UE into shirt with Mod A and vcs to improve Lt attention ?OT Short Term Goal 3 (Week 2): Pt will perform AAROM/PROM HEP for LUE with no more than MIN verbal cues ?OT Short Term Goal 4 (Week 2): Pt will perform sit <> stand with LRAD with Mod A of 1 in prep for LB ADL ? ?Skilled Therapeutic Interventions/Progress Updates:  ?Skilled OT intervention completed with focus on LUE NMR, self-ROM, and functional transfers. Pt received seated in w/c agreeable to session. Pt used hemi-technique to self-propel to gym with supervision. Completed squat pivot with CGA > R for all transfers during session. At [facility], pt participated in LUE NMR tasks with L hand on mat as stabilizer during 2x12 lateral leans for tricep push ups with CGA needed for postural balance > L, as well as during cone retrieving and returning task towards L side. Pt needed min cues for postural midline as fatigue set in. Therapist applied hand over hand assist to L wrist and elbow stabilization during 2x12 bicep curls with 1 pound dowel to promote AAROM and NMR of LUE. Education/handout provided to pt on shoulder integrity after stroke to prevent sublux/dislocation as well as self-ROM exercises including shoulder external rotation and "cradling." Pt was left in recliner with wife present, chair alarm on and all needs in reach at end of session. ? ? ?Therapy Documentation ?Precautions:  ?Precautions ?Precautions: Fall ?Precaution Comments: Flaccid Lt hemi, hx scoliosis, HTN ?Restrictions ?Weight Bearing Restrictions:  No ? ?Pain: ?No c/o pain  ? ? ?Therapy/Group: Individual Therapy ? ?[place] ?05/02/2021, 7:43 AM ?

## 2021-05-02 NOTE — Progress Notes (Signed)
Physical Therapy Weekly Progress Note ? ?Patient Details  ?Name: Miguel Mccullough ?MRN: [id number] ?Date of Birth: March 26, 1965 ? ?Beginning of progress report period: April 25, 2021 ?End of progress report period: May 02, 2021 ? ?Today's Date: 05/02/2021 ?PT Individual Time: 3734-2876 ?PT Individual Time Calculation (min): 73 min  and Today's Date: 05/02/2021 ?PT Missed Time: 15 Minutes ?Missed Time Reason: Patient fatigue (Pt willing to continue, but muscles completely worn out) ? ?Patient has met 2 of 3 short term goals.  Pt has initiated standing transfers, but is progressing with gait and squat pivot transfers.  ? ?Patient continues to demonstrate the following deficits muscle weakness, impaired timing and sequencing, abnormal tone, unbalanced muscle activation, decreased coordination, and decreased motor planning, decreased attention to left, and decreased standing balance, decreased postural control, hemiplegia, and decreased balance strategies and therefore will continue to benefit from skilled PT intervention to increase functional independence with mobility. ? ?Patient progressing toward long term goals..  Continue plan of care. ? ?PT Short Term Goals ?Week 2:  PT Short Term Goal 1 (Week 2): Pt will self propel w/c with hemi technique with supervision and no cues ?PT Short Term Goal 1 - Progress (Week 2): Met ?PT Short Term Goal 2 (Week 2): Pt will initiate standing transfer training ?PT Short Term Goal 2 - Progress (Week 2): Progressing toward goal ?PT Short Term Goal 3 (Week 2): Pt will complete bed mobility with supervision ?PT Short Term Goal 3 - Progress (Week 2): Met ?Week 3:  PT Short Term Goal 1 (Week 3): Pt will ambulate with LRAD with mod A or better x 50 ft ?PT Short Term Goal 2 (Week 3): Pt will perform squat pivot transfer with supervision ?PT Short Term Goal 3 (Week 3): Pt will initiate standing transfer with LRAD ? ?Skilled Therapeutic Interventions/Progress Updates:  ?Pt seated in w/c on arrival  and agreeable to therapy. Pt with exceptionally bright affect this morning. No complaint of pain. Pt propelled w/c with hemi technique x >200 ft to 4th floor gym with supervision and no cues. Pt then ambulated 5 x 10 steps forward and back with max A for LLE management at hall way rail. Able to manage trunk with cues, except one LOB with max A to recover. Required tot A for LLE management when stepping backward, as well as facilitation for hip extension throughout. Required sufficient rest breaks to maintain safety with no +2. Pt then set up in modified plantigrade with knee block from wedge and yoga blocks to maintain foot placement. Pt performed 2 sets of push ups and scapular protraction/retraction with manual facilitation at scapula and tot A to keep L hand in place. Progressed to walking hands forward/back x 2 bouts on table to encourage weightbearing on L side and pt able to move with LUE with max A and maintain weightbearing position with mod A. After this, attempted further standing activity, but pt too tired to achieve standing position, so returned to room, pt propelling ~50 ft before being transported remaining distance d/t fatigue. Pt performed CGA squat pivot with therapist managing paretic arm (mimicking sling). Pt without difficulty toward L side. Pt then positioned therapeutically with pillows and legs elevated, was left with all needs in reach and alarm active.  ? ?Therapy Documentation ?Precautions:  ?Precautions ?Precautions: Fall ?Precaution Comments: Flaccid Lt hemi, hx scoliosis, HTN ?Restrictions ?Weight Bearing Restrictions: No ?General: ?PT Amount of Missed Time (min): 15 Minutes ?PT Missed Treatment Reason: Patient fatigue (Pt willing to continue, but  muscles completely worn out) ? ? ? ?Therapy/Group: Individual Therapy ? ?[place] ?05/02/2021, 10:36 AM  ?

## 2021-05-02 NOTE — Progress Notes (Signed)
Speech Language Pathology Weekly Progress and Session Note ? ?Patient Details  ?Name: Miguel Mccullough ?MRN: [id number] ?Date of Birth: Jul 16, 1965 ? ?Beginning of progress report period: April 25, 2021 ?End of progress report period: May 02, 2021 ? ?Today's Date: 05/02/2021 ?SLP Individual Time: 1105-1200 ?SLP Individual Time Calculation (min): 55 min ? ?Short Term Goals: ?Week 2: SLP Short Term Goal 1 (Week 2): Patient will implement speech intelligibility strategies at the sentence level with sup A verbal cues when fatigued ?SLP Short Term Goal 1 - Progress (Week 2): Met ?SLP Short Term Goal 2 (Week 2): Patient will consume current diet with implementation of safe swallowing compensatory strategies with mod I verbal cues ?SLP Short Term Goal 2 - Progress (Week 2): Met ?SLP Short Term Goal 3 (Week 2): Pt will alternate attention between two tasks/concepts with min A verbal redirection ?SLP Short Term Goal 3 - Progress (Week 2): Met ?SLP Short Term Goal 4 (Week 2): Pt will recall 3-4 details of relevant information after a delay with sup A verbal cues for use of memory strategies ?SLP Short Term Goal 4 - Progress (Week 2): Met ?SLP Short Term Goal 5 (Week 2): Pt will complete complex problem solving tasks with min A verbal and visual cues ?SLP Short Term Goal 5 - Progress (Week 2): Met ?  ?New Short Term Goals: ?Week 3: SLP Short Term Goal 1 (Week 3): Pt will complete complex problem solving tasks with Supervision A verbal and visual cues ?SLP Short Term Goal 2 (Week 3): Pt will complete higher level cognitive tasks (med management, money management, etc) with min A cues ?SLP Short Term Goal 3 (Week 3): Pt will complete moderately complex alternating attention tasks with min A cues ?SLP Short Term Goal 4 (Week 3): Patient will implement speech intelligibility strategies at the sentence level with Mod I verbal cues when fatigued ?SLP Short Term Goal 5 (Week 3): Pt will utilize compensatory memory strategies as trained to  recall specific, functional information with Supervision A cues ? ?Weekly Progress Updates: ?Pt has made excellent progress and met 5 out of 5 short term goals this reporting period. Pt is currently tolerating a regular/thin diet with mod I verbal cues for compensatory swallow strategies to reduce overt s/s aspiration. Pt speech in 100% intelligible at conversation level with Supervision A cues for use of speech intelligibility strategies as trained. Pt is able to complete functional memory and problem solving tasks with min A cues at this time. Pt demonstrates decreased awareness of current physical and cognitive impairments and how these will impact his daily routine in current and discharge environment. Pt will benefit from ongoing training and education. Pt wife has been present for some tx sessions and family education is also ongoing.  ? ?Intensity: Minumum of 1-2 x/day, 30 to 90 minutes ?Frequency: 3 to 5 out of 7 days ?Duration/Length of Stay: 3/30 ?Treatment/Interventions: Patient/family education;Dysphagia/aspiration precaution training;Cognitive remediation/compensation;Speech/Language facilitation;Functional tasks;Therapeutic Activities ? ?Daily Session ? ?Skilled Therapeutic Interventions: Pt seen for skilled ST with focus on cognitive goals, up in recliner and agreeable to therapeutic tasks. Pt demonstrates decreased awareness into current physical/cognitive changes, denies vision changes, left neglect or memory deficits. SLP facilitating alternating attention task by providing overall Supervision A verbal cues for 90% accuracy. Pt completing visual scanning activity (mall map) requiring extra time and min A cues, this task did increase patient's awareness of visual impairments, states "I'm just now noticing a change". Pt provided education regarding effective compensatory strategies to increase safety  and independence with ADLs at this time. Pt left in recliner with alarm set and all needs within  reach. Cont ST POC.      ? ?General  ?  ?Pain ?Pain Assessment ?Pain Scale: 0-10 ?Pain Score: 0-No pain ? ?Therapy/Group: Individual Therapy ? ?Miguel Mccullough ?05/02/2021, 12:03 PM ? ? ? ? ? ? ?

## 2021-05-02 NOTE — Progress Notes (Signed)
?                                                       PROGRESS NOTE ? ? ?Subjective/Complaints: ? ?Pt reports spasms/tightness better today/in last 24 hours- ?Slept well- still having LLE edema- of note.  ?No issues otherwise- more awake in afternoon per pt.  ? ?ROS:  ? ? ?Pt denies SOB, abd pain, CP, N/V/C/D, and vision changes ? ? ? ? ? ? ? ? ?Objective: ?  ?No results found. ?No results for input(s): WBC, HGB, HCT, PLT in the last 72 hours. ? ? ?Recent Labs  ?  05/01/21 ?5102  ?NA 139  ?K 3.5  ?CL 106  ?CO2 26  ?GLUCOSE 84  ?BUN 21*  ?CREATININE 1.95*  ?CALCIUM 8.9  ? ? ? ?Intake/Output Summary (Last 24 hours) at 05/02/2021 1257 ?Last data filed at 05/02/2021 0744 ?Gross per 24 hour  ?Intake 360 ml  ?Output --  ?Net 360 ml  ?  ? ?  ? ?Physical Exam: ?Vital Signs ?Blood pressure (!) 152/85, pulse 63, temperature 98.5 ?F (36.9 ?C), resp. rate 18, height 5\' 4"  (1.626 m), weight 75.9 kg, SpO2 99 %. ? ? ? ? ? ? ? ? ? ? ? ?General: awake, alert, appropriate, sititng up in bedside chair;  NAD ?HENT: conjugate gaze; oropharynx moist ?CV: regular rate; no JVD ?Pulmonary: CTA B/L; no W/R/R- good air movement ?GI: soft, NT, ND, (+)BS ?Psychiatric: appropriate ?Neurological: Ox3- better awareness of L side ?No conus this AM- barely has Hoffman's on LUE ? MAS of 1 L Side ? ?Ext: no clubbing, cyanosis, 1-2+ edema of LLE -ankle/foot ?Musculoskeletal:  ?   Cervical back: Neck supple. No tenderness.  ? 0/5 on L side still ?Skin: ?   General: Skin is warm and dry.  ?Neurological:  ?   Mental Status: He is alert and oriented to person, place, and time.  ?   Comments: Mild left facial weakness with left inattention and mild left hemifacial sensory loss.  Speech clear and able to follow commands without difficulty. Dense left hemiplegia with sensory deficits. LUE /LLE 0/5 except for trace at foot. RUE and RLE 5/5. No motor/sensory changes today ?  ?  ? ?Assessment/Plan: ?1. Functional deficits which require 3+ hours per day of  interdisciplinary therapy in a comprehensive inpatient rehab setting. ?Physiatrist is providing close team supervision and 24 hour management of active medical problems listed below. ?Physiatrist and rehab team continue to assess barriers to discharge/monitor patient progress toward functional and medical goals ? ?Care Tool: ? ?Bathing ?   ?Body parts bathed by patient: Chest, Abdomen, Front perineal area, Right upper leg, Left upper leg, Face, Left arm, Right lower leg, Left lower leg  ? Body parts bathed by helper: Buttocks, Right arm, Right lower leg, Left lower leg ?  ?  ?Bathing assist Assist Level: Maximal Assistance - Patient 24 - 49% ?  ?  ?Upper Body Dressing/Undressing ?Upper body dressing   ?What is the patient wearing?: Pull over shirt ?   ?Upper body assist Assist Level: Maximal Assistance - Patient 25 - 49% ?   ?Lower Body Dressing/Undressing ?Lower body dressing ? ? ?   ?What is the patient wearing?: Pants, Underwear/pull up ? ?  ? ?Lower body assist Assist for lower body dressing: Total Assistance -  Patient < 25% ?   ? ?Toileting ?Toileting Toileting Activity did not occur (Probation officer and hygiene only): N/A (no void or bm)  ?Toileting assist Assist for toileting: 2 Helpers ?  ?  ?Transfers ?Chair/bed transfer ? ?Transfers assist ?   ? ?Chair/bed transfer assist level: Minimal Assistance - Patient > 75% ?  ?  ?Locomotion ?Ambulation ? ? ?Ambulation assist ? ?   ? ?Assist level: 2 helpers ?Assistive device: Lite Gait ?Max distance: 150  ? ?Walk 10 feet activity ? ? ?Assist ? Walk 10 feet activity did not occur: Safety/medical concerns ? ?Assist level: 2 helpers ?Assistive device: Lite Gait  ? ?Walk 50 feet activity ? ? ?Assist Walk 50 feet with 2 turns activity did not occur: Safety/medical concerns ? ?Assist level: 2 helpers ?Assistive device: Lite Gait  ? ? ?Walk 150 feet activity ? ? ?Assist Walk 150 feet activity did not occur: Safety/medical concerns ? ?Assist level: 2 helpers ?Assistive  device: Lite Gait ?  ? ?Walk 10 feet on uneven surface  ?activity ? ? ?Assist Walk 10 feet on uneven surfaces activity did not occur: Safety/medical concerns ? ? ?  ?   ? ?Wheelchair ? ? ? ? ?Assist Is the patient using a wheelchair?: Yes ?Type of Wheelchair: Manual ?  ? ?Wheelchair assist level: Supervision/Verbal cueing ?Max wheelchair distance: 150 ft  ? ? ?Wheelchair 50 feet with 2 turns activity ? ? ? ?Assist ? ?  ?  ? ? ?Assist Level: Supervision/Verbal cueing  ? ?Wheelchair 150 feet activity  ? ? ? ?Assist ?   ? ? ?Assist Level: Supervision/Verbal cueing  ? ?Blood pressure (!) 152/85, pulse 63, temperature 98.5 ?F (36.9 ?C), resp. rate 18, height 5\' 4"  (1.626 m), weight 75.9 kg, SpO2 99 %. ? ?Medical Problem List and Plan: ?1. 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            -patient may  shower ?            -ELOS/Goals: 14-20 days- min A to supervision ? D/c date 3/30 ? Con't CIR- PT, OT and SLP ?2.  Antithrombotics: ?-DVT/anticoagulation:  lovenox ? -dopplers reviewed and negative ?            -antiplatelet therapy: N/A due to bleed.  ?3. Pain Management:: On tylenol as needed ?4. Mood: LCSW to follow for evaluation an dsup ?            -antipsychotic agents: N/A ?5. Neuropsych: This patient is capable of making decisions on his own behalf. ?6. Skin/Wound Care: Routine pressure relief measures.  ?7. Fluids/Electrolytes/Nutrition: Monitor I/O. Check CMET in am.  ?8. HTN: goal to keep SBP< 160 ? -3/5 bp remains poorly controlled ?            -- Catapress 2 mg TID, metoprolol increased to 75 mg BID and Norvasc 10 qd. ?  -will begin scheduled hydralazine 50mg  q8 ? -will use addnl hydralazine prn  ? 3/6- just added hydralazine- BP 165/98 this AM- will monitor and follow trend  ?3/9- BP 150s/99- will increase hydralazine to 100 mg TID ? 3/10- just increased-hydralazine-  BP 150s/90s- wait to let it kick in ?3/15- BP slightly elevated, but doing much better- con't  regimen ? 3/16- BP running 140s/80s- pulse in 60s so cannot increase B blocker-  ?3/55- BP 732K systolic- on norvasc max dose and Clonidine 0.2 mg TID- and Hydralazine 100 mg TID- and  Metoprolol 75 mg BID- but cannot increase due to borderline bradycardia. Will con't to monitor ?9. Persistent hypokalemia: Start Kdur 20 meq daily. Check Mg level ? 3/3- Will replete 40 mEq x2 and recheck Monday- last Mg on 2/28 was 2.3 ? 3/6- K+ up to 3.6- con't to monitor ? 3/8- labs in AM ? 3/9- Mg 1.8 and K+ 3.5- will con't to monitor ? 3/12- labs Monday  ? 3/14- K+ 3.7- con't to monitor ? 3/16- K+ 3.5- still in normal range, although low- will ask pt to eat bananas, etc ?10.  Severe LVH: Has been referred to cardiology for work up. Continue metoprolol bid.  ?11. 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  ?--Renal ultrasound with mild evidence of medical renal disease ?            --encourage fluid intake. ?3/3- down slightly to 2.21- will avoid nephrotoxic agents ?3/9- Renal U/S normal- Cr up slightly to 1.99 and BUN 25- think it's leveling out- will con't to monitor ?3/13- Cr up slightly more to 2.08- and BUN 23- will recheck in Am and push fluids- also, might have infection/UTI which could affect Cr slightly? ? 3/14- U/A (-) and Cr up again to 2.12 and BUN 23- has (+) tenting- asked pt to drink 6-8 cups/day and will recheck Thursday ?3/16- Cr up to 1.95 and BUN 21- doing much better when drinks- con't regimen ? 3/17- will recheck Monday ?12, Elevated AST: recheck in am. ?13. Hyperlipidemia: Continue Lipitor.  ?14. ABLA: Likely due to bleed with drop from 14.4-->12.6.  ?            --monitor for signs of bleeding. Recheck CBC Monday  ? 3/6- Hb up to 13.7- doing better even with improvement in BUN/Cr.  ?15. L neglect and lethargy-pt appears more alert ? -monitor for need of stimulant ? 3/15- will add Ritalin 2.5 mg qnoon to help with PM sedation- due to BP issues, don't want high dose-  cannot increase Amantadine due to renal function.  ? 3/16- much improved yesterday afternoon- will con't ritalin daily here, however at home; will have pt use prn in afternoon- also educate don brain breaks 15

## 2021-05-03 NOTE — Progress Notes (Signed)
Physical Therapy Session Note ? ?Patient Details  ?Name: [name] ?MRN: [id number] ?Date of Birth: Jun 09, 1965 ? ?Today's Date: 05/03/2021 ?PT Individual Time: 0901-1000 ?PT Individual Time Calculation (min): 59 min  ? ?Short Term Goals: ?Week 2:  PT Short Term Goal 1 (Week 2): Pt will self propel w/c with hemi technique with supervision and no cues ?PT Short Term Goal 1 - Progress (Week 2): Met ?PT Short Term Goal 2 (Week 2): Pt will initiate standing transfer training ?PT Short Term Goal 2 - Progress (Week 2): Progressing toward goal ?PT Short Term Goal 3 (Week 2): Pt will complete bed mobility with supervision ?PT Short Term Goal 3 - Progress (Week 2): Met ? ?Skilled Therapeutic Interventions/Progress Updates: Pt presents sitting in w/c and eager for therapy.  Pt wheeled self w/ R extremities to elevator w supervision.  PT completed w/c mobility to main gym for energy conservation.  Pt performed squat pivot w/c > mat table w min A for LLE.  PT doffed Givmor sling for participation w/ WB to left UE via hand as well as down to L elbow/FA, including down laterally and posteriorly.  Pt required max A for pushing back to midline sitting.  Donned Givmor sling for modified Step pivot to w/c on Right, including full turn to w/c.  Pt wheeled into // bars for stepping forward and back w/ R LE, min A for stabilization of L knee.  Pt educated on scooting forward on seat and pushing up from seat surface vs pulling on bars.  Pt amb forward and back in  bars x 2 w/ min A for advancing LLE but improved weight shift R.  Pt taken to hallway and amb using R hand rail w/ min A for stabilizing L knee, although HS and Quad firing felt w/ weight acceptance to left.  Pt educated on sequencing and allowing for weight acceptance on L prior to stepping w/ R LE to increase step length R.  Verbal cues for upright posture and use of R UE.  Pt returned to room and performed stepping transfer to recliner w/ min A for advancement LLE.  LEs  elevated.  Spouse present in room.  All needs in reach. ?   ? ?Therapy Documentation ?Precautions:  ?Precautions ?Precautions: Fall ?Precaution Comments: Flaccid Lt hemi, hx scoliosis, HTN ?Restrictions ?Weight Bearing Restrictions: No ?General: ?  ?Vital Signs: ? ?Pain:0/10 ?Pain Assessment ?Pain Scale: 0-10 ?Pain Score: 0-No pain ?Mobility: ? ? ? ? ?Therapy/Group: Individual Therapy ? ?[name] ?05/03/2021, 10:51 AM  ?

## 2021-05-03 NOTE — Consult Note (Signed)
Neuropsychological Consultation ? ? ?Patient:   Miguel Mccullough  ? ?DOB:   [birth year] ? ?MR Number:  [id number] ? ?Location:  [facility] [address] ?[id number] [facility] ?[place] [address] ?Dept: [id number] ?Loc: [phone] ?          ?Date of Service:   05/02/2021 ? ?Start Time:   8 AM ?End Time:   9 AM ? ?Provider/Observer:  Ilean Skill, Psy.D.   ?    Clinical Neuropsychologist ?     ? ?Billing Code/Service: [id number] ? ?Chief Complaint:    Miguel Mccullough is a 56 year old male with a past medical history including hypertension but was not taking any blood pressure medications at the time.  Patient was stopped taking his medications and not followed up with PCP.  Patient was admitted on 04/13/2021 with sudden onset of slurred speech with left-sided numbness followed by weakness.  Patient had blood pressure noted in route to the hospital into 70s.  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  Patient with resulting left-sided weakness with numbness and facial droop and slurred speech affecting overall functional status and admitted to CIR for functional decline and rehab interventions. ? ?Reason for Service:  Patient was referred for neuropsychological consultation due to coping and adjustment issues and screening for any cognitive changes.  Below is the HPI for the current admission. ? ?HPI: [name] is a 56 year old R handed  male with history of HTN but no medications (had stopped meds and not followed up with PCP) who was admitted on 04/13/21 with sudden onset of slurred speech with left-sided numbness followed by weakness and BP noted to be elevated in 270s in route to the hospital.  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  CTA head/neck was negative for aneurysm, dissection, occlusion or significant stenosis.  He was  started on Cleviprex for BP control with follow-up CT head showing no change in IPH.  2D echo done showing EF 70 to 75% with severe LVH and grade 2 diastolic dysfunction as well as mild to moderate LA dilatation. ?  ?He with question of CKD and had worsening of renal status with follow-up renal ultrasound done 03/02 showing minimal increase in echogenicity.  He also developed significant drop in potassium requiring multiple runs of K as well as oral supplementation.  He was weaned off Cleviprex yesterday and BP medications being titrated for SBP goal < 160.  He was noted to have cognitive deficits with poor safety awareness as well as mild oropharyngeal dysphagia with premature spillage and currently tolerating regular diet.  Patient with resultant left-sided weakness with numbness, left facial droop with slurred speech as well as mild dysphagia affecting overall functional status.  CIR was recommended due to functional decline. ? ?Current Status:  Patient was awake and alert as I entered the room sitting in his bedside chair with wife present.  He acknowledged ongoing left-sided weakness but described good motivation for therapeutic interventions.  Patient described improving status.  He was generally aware of overall hemorrhagic stroke event.  Mental status and cognition appeared to be adequate with no significant cognitive deficits noted and good receptive and expressive language skills. ? ?Behavioral Observation: [name]  presents as a 55 y.o.-year-old Right handed African American Male who appeared his stated age. his dress was Appropriate and he was Well Groomed and his manners were  Appropriate to the situation.  his participation was indicative of Appropriate behaviors.  There were physical disabilities noted.  he displayed an appropriate level of cooperation and motivation.   ? ? ?Interactions:    Active Appropriate ? ?Attention:   within normal limits and attention span and concentration were age  appropriate ? ?Memory:   within normal limits; recent and remote memory intact ? ?Visuo-spatial:  not examined ? ?Speech (Volume):  normal ? ?Speech:   normal; slurred ? ?Thought Process:  Coherent and Relevant ? ?Though Content:  WNL; not suicidal and not homicidal ? ?Orientation:   person, place, time/date, and situation ? ?Judgment:   Good ? ?Planning:   Fair ? ?Affect:    Appropriate ? ?Mood:    Dysphoric ? ?Insight:   Good ? ?Intelligence:   normal ? ? ?Medical History:   ?Past Medical History:  ?Diagnosis Date  ? COVID-19 2021  ? continues to have increase in suptum production.  ? Hypertension   ? ? ?     ?Patient Active Problem List  ? Diagnosis Date Noted  ? ICH (intracerebral hemorrhage) ([place]) 04/13/2021  ? ?     ? ?Psychiatric History:  No prior psychiatric history ? ?Family Med/Psych History:  ?Family History  ?Problem Relation Age of Onset  ? Hypertension Mother   ? Hypertension Father   ? Hypertension Sister   ? Hypertension Brother   ? ? ?Impression/DX:  Miguel Mccullough is a 56 year old male with a past medical history including hypertension but was not taking any blood pressure medications at the time.  Patient was stopped taking his medications and not followed up with PCP.  Patient was admitted on 04/13/2021 with sudden onset of slurred speech with left-sided numbness followed by weakness.  Patient had blood pressure noted in route to the hospital into 70s.  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  Patient with resulting left-sided weakness with numbness and facial droop and slurred speech affecting overall functional status and admitted to CIR for functional decline and rehab interventions. ? ?Patient was awake and alert as I entered the room sitting in his bedside chair with wife present.  He acknowledged ongoing left-sided weakness but described good motivation for therapeutic interventions.  Patient described improving status.  He was  generally aware of overall hemorrhagic stroke event.  Mental status and cognition appeared to be adequate with no significant cognitive deficits noted and good receptive and expressive language skills. ? ?Disposition/Plan:  Today we worked on coping and adjustment issues with extended hospital stay and improving but ongoing functional decline due to motor deficits secondary to a basal ganglia hemorrhagic bleed. ? ?Diagnosis:    Nontraumatic subcortical hemorrhagic bleed in the area of the right side basal ganglia. ? ?      ? ?Electronically Signed ? ? ?_______________________ ?Ilean Skill, Psy.D. ?Clinical Neuropsychologist ? ? ?      ? ?

## 2021-05-04 MED ORDER — DIPHENHYDRAMINE HCL 12.5 MG/5ML PO ELIX: 12.5000 mg | ORAL_SOLUTION | Freq: Every evening | ORAL | Status: DC | PRN

## 2021-05-04 MED ORDER — HYDROCORTISONE 1 % EX CREA
TOPICAL_CREAM | Freq: Three times a day (TID) | CUTANEOUS | Status: DC
  Filled 2021-05-04: qty 28

## 2021-05-04 MED ORDER — AMLODIPINE BESYLATE 10 MG PO TABS
10.0000 mg | ORAL_TABLET | Freq: Every day | ORAL | Status: DC
Start: 2021-05-05 — End: 2021-05-15
  Administered 2021-05-05 – 2021-05-14 (×10): 10 mg via ORAL
  Filled 2021-05-04 (×10): qty 1

## 2021-05-04 NOTE — Progress Notes (Signed)
Physical Therapy Session Note ? ?Patient Details  ?Name: Miguel Mccullough ?MRN: [id number] ?Date of Birth: Oct 09, 1965 ? ?Today's Date: 05/04/2021 ?PT Individual Time: 3419-3790 ?PT Individual Time Calculation (min): 38 min  ? ?Short Term Goals: ?Week 2:  PT Short Term Goal 1 (Week 2): Pt will self propel w/c with hemi technique with supervision and no cues ?PT Short Term Goal 1 - Progress (Week 2): Met ?PT Short Term Goal 2 (Week 2): Pt will initiate standing transfer training ?PT Short Term Goal 2 - Progress (Week 2): Progressing toward goal ?PT Short Term Goal 3 (Week 2): Pt will complete bed mobility with supervision ?PT Short Term Goal 3 - Progress (Week 2): Met ?Week 3:  PT Short Term Goal 1 (Week 3): Pt will ambulate with LRAD with mod A or better x 50 ft ?PT Short Term Goal 2 (Week 3): Pt will perform squat pivot transfer with supervision ?PT Short Term Goal 3 (Week 3): Pt will initiate standing transfer with LRAD ? ?Skilled Therapeutic Interventions/Progress Updates:  ? Received pt sitting in recliner with Givmor sling donned, pt agreeable to PT treatment, and denied any pain during session. Session with emphasis on functional mobility/transfers, generalized strengthening and endurance, NMR, and dynamic standing balance/coordination. Pt transferred recliner<>WC squat<>pivot to L with light mod A and manual facilitation to advance and position LLE. Pt transported to/from room in [facility] dependently for time management purposes. Pt performed x 3 additional squat<>pivot transfers to R with min A throughout session. Sit<>stand with min A blocking L knee and worked on upright posture, hip extension, and glute activation while maintaining midline (pt with tendency to lean R) - pt with difficulty with glute activation and weight shifting to LLE. Placed WC in front of pt and pt able to achieve greater hip extension with external assist; worked on standing mini-squats 1x5 and 2x10 holding onto WC handle with RUE and blocking L  knee from buckling. Transitioned to pre-gait stepping with RLE 2x8 reps, blocking L knee, with emphasis on weight shifting to L but pt with difficulty taking adequate size step with RLE. Pt requested to return to recliner at end of session and  left with all needs within reach.  ? ?Therapy Documentation ?Precautions:  ?Precautions ?Precautions: Fall ?Precaution Comments: Flaccid Lt hemi, hx scoliosis, HTN ?Restrictions ?Weight Bearing Restrictions: No ? ?Therapy/Group: Individual Therapy ?Blenda Nicely ?Becky Sax PT, DPT  ?05/04/2021, 7:39 AM  ?

## 2021-05-04 NOTE — Progress Notes (Signed)
?                                                       PROGRESS NOTE ? ? ?Subjective/Complaints: ? ?Pt reports new onset burning sensation on L shin- started last night- no trauma/bruising.  ?Shower yesterday- was lotioned up, but still itching all over- no rash per wife.  ?Started in last 3-4 days- wondering if dry skin vs a new medicine? ? ?ROS:  ? ?Pt denies SOB, abd pain, CP, N/V/C/D, and vision changes ? ? ? ? ? ? ? ? ?Objective: ?  ?No results found. ?No results for input(s): WBC, HGB, HCT, PLT in the last 72 hours. ? ? ?No results for input(s): NA, K, CL, CO2, GLUCOSE, BUN, CREATININE, CALCIUM in the last 72 hours. ? ? ? ?Intake/Output Summary (Last 24 hours) at 05/04/2021 0944 ?Last data filed at 05/04/2021 7425 ?Gross per 24 hour  ?Intake 714 ml  ?Output --  ?Net 714 ml  ?  ? ?  ? ?Physical Exam: ?Vital Signs ?Blood pressure (!) 152/95, pulse 64, temperature 98.5 ?F (36.9 ?C), temperature source Oral, resp. rate 18, height 5\' 4"  (1.626 m), weight 75.9 kg, SpO2 96 %. ? ? ? ? ? ? ? ? ? ? ? ? ?General: awake, alert, appropriate, sitting up in bedside chair; wife in room; NAD ?HENT: conjugate gaze; looking around room with no cues; oropharynx moist ?CV: regular rate; no JVD ?Pulmonary: CTA B/L; no W/R/R- good air movement ?GI: soft, NT, ND, (+)BS ?Psychiatric: appropriate- still slightly flat, but better ?Neurological: Ox3 ?No clonus this AM- barely has Hoffman's on LUE ? MAS of 1 L Side-  ? ?Ext: no clubbing, cyanosis, 1-2+ edema of LLE -ankle/foot ?Musculoskeletal:  ?   Cervical back: Neck supple. No tenderness.  ? 0/5 on L side still ?Skin: ?   General: Skin is warm and dry.  ?Neurological:  ?   Mental Status: He is alert and oriented to person, place, and time.  ?   Comments: Mild left facial weakness with left inattention and mild left hemifacial sensory loss.  Speech clear and able to follow commands without difficulty. Dense left hemiplegia with sensory deficits. LUE /LLE 0/5 except for trace at foot.  RUE and RLE 5/5. No motor/sensory changes today ?  ?  ? ?Assessment/Plan: ?1. Functional deficits which require 3+ hours per day of interdisciplinary therapy in a comprehensive inpatient rehab setting. ?Physiatrist is providing close team supervision and 24 hour management of active medical problems listed below. ?Physiatrist and rehab team continue to assess barriers to discharge/monitor patient progress toward functional and medical goals ? ?Care Tool: ? ?Bathing ?   ?Body parts bathed by patient: Chest, Abdomen, Front perineal area, Right upper leg, Left upper leg, Face, Left arm, Right lower leg, Left lower leg  ? Body parts bathed by helper: Buttocks, Right arm, Right lower leg, Left lower leg ?  ?  ?Bathing assist Assist Level: Maximal Assistance - Patient 24 - 49% ?  ?  ?Upper Body Dressing/Undressing ?Upper body dressing   ?What is the patient wearing?: Pull over shirt ?   ?Upper body assist Assist Level: Maximal Assistance - Patient 25 - 49% ?   ?Lower Body Dressing/Undressing ?Lower body dressing ? ? ?   ?What is the patient wearing?: Pants, Underwear/pull up ? ?  ? ?  Lower body assist Assist for lower body dressing: Total Assistance - Patient < 25% ?   ? ?Toileting ?Toileting Toileting Activity did not occur (Probation officer and hygiene only): N/A (no void or bm)  ?Toileting assist Assist for toileting: 2 Helpers ?  ?  ?Transfers ?Chair/bed transfer ? ?Transfers assist ?   ? ?Chair/bed transfer assist level: Minimal Assistance - Patient > 75% ?  ?  ?Locomotion ?Ambulation ? ? ?Ambulation assist ? ?   ? ?Assist level: Minimal Assistance - Patient > 75% ?Assistive device: Other (comment) (hall rail) ?Max distance: 30  ? ?Walk 10 feet activity ? ? ?Assist ? Walk 10 feet activity did not occur: Safety/medical concerns ? ?Assist level: Minimal Assistance - Patient > 75% ?Assistive device: Other (comment) (hall rail)  ? ?Walk 50 feet activity ? ? ?Assist Walk 50 feet with 2 turns activity did not occur:  Safety/medical concerns ? ?Assist level: 2 helpers ?Assistive device: Lite Gait  ? ? ?Walk 150 feet activity ? ? ?Assist Walk 150 feet activity did not occur: Safety/medical concerns ? ?Assist level: 2 helpers ?Assistive device: Lite Gait ?  ? ?Walk 10 feet on uneven surface  ?activity ? ? ?Assist Walk 10 feet on uneven surfaces activity did not occur: Safety/medical concerns ? ? ?  ?   ? ?Wheelchair ? ? ? ? ?Assist Is the patient using a wheelchair?: Yes ?Type of Wheelchair: Manual ?  ? ?Wheelchair assist level: Supervision/Verbal cueing ?Max wheelchair distance: 90  ? ? ?Wheelchair 50 feet with 2 turns activity ? ? ? ?Assist ? ?  ?  ? ? ?Assist Level: Supervision/Verbal cueing  ? ?Wheelchair 150 feet activity  ? ? ? ?Assist ?   ? ? ?Assist Level: Supervision/Verbal cueing  ? ?Blood pressure (!) 152/95, pulse 64, temperature 98.5 ?F (36.9 ?C), temperature source Oral, resp. rate 18, height 5\' 4"  (1.626 m), weight 75.9 kg, SpO2 96 %. ? ?Medical Problem List and Plan: ?1. 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            -patient may  shower ?            -ELOS/Goals: 14-20 days- min A to supervision ? D/c date 3/30 ? Continue CIR- PT, OT and SLP ? ?2.  Antithrombotics: ?-DVT/anticoagulation:  lovenox ? -dopplers reviewed and negative ?            -antiplatelet therapy: N/A due to bleed.  ?3. Pain Management:: On tylenol as needed ?4. Mood: LCSW to follow for evaluation an dsup ?            -antipsychotic agents: N/A ?5. Neuropsych: This patient is capable of making decisions on his own behalf. ?6. Skin/Wound Care: Routine pressure relief measures.  ?7. Fluids/Electrolytes/Nutrition: Monitor I/O. Check CMET in am.  ?8. HTN: goal to keep SBP< 160 ? -3/5 bp remains poorly controlled ?            -- Catapress 2 mg TID, metoprolol increased to 75 mg BID and Norvasc 10 qd. ?  -will begin scheduled hydralazine 50mg  q8 ? -will use addnl hydralazine prn  ? 3/6- just added  hydralazine- BP 165/98 this AM- will monitor and follow trend  ?3/9- BP 150s/99- will increase hydralazine to 100 mg TID ? 3/10- just increased-hydralazine-  BP 150s/90s- wait to let it kick in ?3/15- BP slightly elevated, but doing much better- con't regimen ? 3/16- BP running 140s/80s- pulse in  60s so cannot increase B blocker-  ?0/35- BP 465K systolic- on norvasc max dose and Clonidine 0.2 mg TID- and Hydralazine 100 mg TID- and Metoprolol 75 mg BID- but cannot increase due to borderline bradycardia. Will con't to monitor ?3/19- BP 152/95 this AM but 133/85 last night- change Norvasc to bedtime to try and help with AM increase in BP.  ?9. Persistent hypokalemia: Start Kdur 20 meq daily. Check Mg level ? 3/3- Will replete 40 mEq x2 and recheck Monday- last Mg on 2/28 was 2.3 ? 3/6- K+ up to 3.6- con't to monitor ? 3/8- labs in AM ? 3/9- Mg 1.8 and K+ 3.5- will con't to monitor ? 3/12- labs Monday  ? 3/14- K+ 3.7- con't to monitor ? 3/16- K+ 3.5- still in normal range, although low- will ask pt to eat bananas, etc ?10.  Severe LVH: Has been referred to cardiology for work up. Continue metoprolol bid.  ?11. 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  ?--Renal ultrasound with mild evidence of medical renal disease ?            --encourage fluid intake. ?3/3- down slightly to 2.21- will avoid nephrotoxic agents ?3/9- Renal U/S normal- Cr up slightly to 1.99 and BUN 25- think it's leveling out- will con't to monitor ?3/13- Cr up slightly more to 2.08- and BUN 23- will recheck in Am and push fluids- also, might have infection/UTI which could affect Cr slightly? ? 3/14- U/A (-) and Cr up again to 2.12 and BUN 23- has (+) tenting- asked pt to drink 6-8 cups/day and will recheck Thursday ?3/16- Cr up to 1.95 and BUN 21- doing much better when drinks- con't regimen ? 3/17- will recheck Monday ?12, Elevated AST: recheck in am. ?13. Hyperlipidemia: Continue Lipitor.  ?14. ABLA: Likely  due to bleed with drop from 14.4-->12.6.  ?            --monitor for signs of bleeding. Recheck CBC Monday  ? 3/6- Hb up to 13.7- doing better even with improvement in BUN/Cr.  ?15. L neglect and lethargy-pt appe

## 2021-05-05 LAB — CBC
HCT: 36.9 % — ABNORMAL LOW (ref 39.0–52.0)
Hemoglobin: 12.3 g/dL — ABNORMAL LOW (ref 13.0–17.0)
MCH: 29 pg (ref 26.0–34.0)
MCHC: 33.3 g/dL (ref 30.0–36.0)
MCV: 87 fL (ref 80.0–100.0)
Platelets: 228 10*3/uL (ref 150–400)
RBC: 4.24 MIL/uL (ref 4.22–5.81)
RDW: 13.1 % (ref 11.5–15.5)
WBC: 7.8 10*3/uL (ref 4.0–10.5)
nRBC: 0 % (ref 0.0–0.2)

## 2021-05-05 LAB — BASIC METABOLIC PANEL
Anion gap: 11 (ref 5–15)
BUN: 17 mg/dL (ref 6–20)
CO2: 23 mmol/L (ref 22–32)
Calcium: 9.2 mg/dL (ref 8.9–10.3)
Chloride: 105 mmol/L (ref 98–111)
Creatinine, Ser: 2.07 mg/dL — ABNORMAL HIGH (ref 0.61–1.24)
GFR, Estimated: 38 mL/min — ABNORMAL LOW (ref >60)
Glucose, Bld: 153 mg/dL — ABNORMAL HIGH (ref 70–99)
Potassium: 3.5 mmol/L (ref 3.5–5.1)
Sodium: 139 mmol/L (ref 135–145)

## 2021-05-05 NOTE — Progress Notes (Signed)
Physical Therapy Session Note ? ?Patient Details  ?Name: Miguel Mccullough ?MRN: [id number] ?Date of Birth: 09-Dec-1965 ? ?Today's Date: 05/05/2021 ?PT Individual Time: 7829-5621 ?PT Individual Time Calculation (min): 90 min  ? ?Short Term Goals: ?Week 1:  PT Short Term Goal 1 (Week 1): Pt will perform bed to wc transfers to R w/min assist and light cues, to L w/mod to max assist of 1 ?PT Short Term Goal 1 - Progress (Week 1): Met ?PT Short Term Goal 2 (Week 1): Pt will sit statically w/feet supported w/cga ?PT Short Term Goal 2 - Progress (Week 1): Met ?PT Short Term Goal 3 (Week 1): Pt will ambulate up to 65f w/LRAD and 2 person assist ?PT Short Term Goal 3 - Progress (Week 1): Met ?PT Short Term Goal 4 (Week 1): Pt will propel wc 282fw/R exts and moderate cueing to attend to L environment. ?PT Short Term Goal 4 - Progress (Week 1): Met ?Week 2:  PT Short Term Goal 1 (Week 2): Pt will self propel w/c with hemi technique with supervision and no cues ?PT Short Term Goal 1 - Progress (Week 2): Met ?PT Short Term Goal 2 (Week 2): Pt will initiate standing transfer training ?PT Short Term Goal 2 - Progress (Week 2): Progressing toward goal ?PT Short Term Goal 3 (Week 2): Pt will complete bed mobility with supervision ?PT Short Term Goal 3 - Progress (Week 2): Met ? ?Skilled Therapeutic Interventions/Progress Updates:  ?  Pt received in recliner and agreeable to therapy.  No complaint of pain.  ? ?Transfers: ?Pt performed squat pivot recliner<>w/c<>mat table both directions with CGA and VC. During first attempt, required cueing to use more than one squat to traverse greater distance out of recliner.  ?Sit to stand EOM and w/c to RW with min A for LLE knee block and positioning hand on walker splint. Noted easier stand with LLE starting on RW, but hand shifting on splint, so transitioned to standing first and therapist placing on splint. ? ?Gait training: ?Pt ambulated x55 ft total in 4 bouts, with greatest bout being 19 ft. Pt  with improving ability to support weight on LLE, mod A for limb advancement. Pt demoed one instance of knee buckling requiring tot A x 2 to recover. Pt utilized RW throughout gait training, standing with min A to support LLE and manage LUE on RW. ? ?Neuromuscular Reeducation: ?Pt then got into quadruped from standing position for tot A x 2 for managing L extremities, Once in Quadruped, pt able to support self and rock forward and back with mod A to maintain position. Attempted to transition to transition to tall kneeling with UE on bench, but knees slid out, so shifted onto L hip and then to sitting with +2 assist.  ? ?Session focused on standing with RW with mirror set up for visual feed back and self correction of posture. Pt performed 2 x 6 squats with cueing for even weight distribution between BLE, tapping facilitation at quad and glute. As LLE fatigued, transitioned to letting L hip sink into rotated and flexed position and then pulling up to full extension to fatigue x 2 bouts. Pt demoed improving fine motor control at hip with this activity. Progressed back to squats with emphasis on finer motor control at hip, pt able to better control weight distribution after working smaller movement. ? ?Shifted to working LLE limb advancement with pre gait stepping/marching x 2 bouts with max A to advance LLE into split stance  position. Multimodal cuing for hip and knee extension, and hamstring activation. No active hamstring palpated at this time, but pt with improved ability to move LLE when cued to think about using hamstring to bend knee.  ? ?Pt noted to put pressure through LUE on RW at times to assist with upright posture. ? ?Pt then propelled w/c with R extremities back to room for strength and functional mobility and performed squat pivot to recliner. Pt positioned therapeutically with pillows and was left with all needs in reach and alarm active.  ? ? ? ?Therapy Documentation ?Precautions:   ?Precautions ?Precautions: Fall ?Precaution Comments: Flaccid Lt hemi, hx scoliosis, HTN ?Restrictions ?Weight Bearing Restrictions: No ?General: ?  ? ? ? ? ?Therapy/Group: Individual Therapy ? ?[place] ?05/05/2021, 10:04 AM  ?

## 2021-05-05 NOTE — Progress Notes (Signed)
Speech Language Pathology Daily Session Note ? ?Patient Details  ?Name: Miguel Mccullough ?MRN: [id number] ?Date of Birth: Jan 19, 1966 ? ?Today's Date: 05/05/2021 ?SLP Individual Time: 0388-8280 ?SLP Individual Time Calculation (min): 25 min ? ?Short Term Goals: ?Week 3: SLP Short Term Goal 1 (Week 3): Pt will complete complex problem solving tasks with Supervision A verbal and visual cues ?SLP Short Term Goal 2 (Week 3): Pt will complete higher level cognitive tasks (med management, money management, etc) with min A cues ?SLP Short Term Goal 3 (Week 3): Pt will complete moderately complex alternating attention tasks with min A cues ?SLP Short Term Goal 4 (Week 3): Patient will implement speech intelligibility strategies at the sentence level with Mod I verbal cues when fatigued ?SLP Short Term Goal 5 (Week 3): Pt will utilize compensatory memory strategies as trained to recall specific, functional information with Supervision A cues ? ?Skilled Therapeutic Interventions: Skilled ST treatment focused on cognitive goals. SLP facilitated session by providing min A for completion of moderately complex scheduling/organization task. Pt required increased support for visual scanning, error awareness and repair. Patient demonstrated carry over of organization techniques with sup A verbal cues. Patient was left in wheelchair with alarm activated and immediate needs within reach at end of session. Continue per current plan of care.   ?   ?Pain ?  ? ?Therapy/Group: Individual Therapy ? ?Emmalou Hunger T Kennesha Brewbaker ?05/05/2021, 12:48 PM ?

## 2021-05-05 NOTE — Progress Notes (Signed)
Occupational Therapy Weekly Progress Note ? ?Patient Details  ?Name: Miguel Mccullough ?MRN: [id number] ?Date of Birth: 1965-10-24 ? ?Beginning of progress report period: April 24, 2021 ?End of progress report period: May 05, 2021 ? ?Today's Date: 05/05/2021 ?OT Individual Time: 4098-1191 ?OT Individual Time Calculation (min): 71 min  ? ? ?Patient has met 4 of 4 short term goals. Patient making steady progress toward goals. Currently requiring Min A for UB ADLs and Mod A for LB ADLs. Wife assists with bathing/dressing at night. Continues to require Max A for incorporation of affected LUE (flaccid paralysis with increased flexor tone at elbow). Patient currently completing ADL transfers with Min A and cues for attention to L. Requires assist to position LLE prior to transfers.  ? ?Patient continues to demonstrate the following deficits: muscle joint tightness and muscle paralysis, impaired timing and sequencing, abnormal tone, unbalanced muscle activation, and decreased coordination, decreased midline orientation and decreased attention to left, and decreased sitting balance, decreased standing balance, decreased postural control, hemiplegia, and decreased balance strategies and therefore will continue to benefit from skilled OT intervention to enhance overall performance with BADL and Reduce care partner burden. ? ?Patient progressing toward long term goals..  Continue plan of care. ? ?OT Short Term Goals ?Week 2:  OT Short Term Goal 1 (Week 2): Pt will verbalize 2 joint protection strategies to use during bed mobility and/or transfers to improve Lt attention ?OT Short Term Goal 1 - Progress (Week 2): Met ?OT Short Term Goal 2 (Week 2): Pt will thread Lt UE into shirt with Mod A and vcs to improve Lt attention ?OT Short Term Goal 2 - Progress (Week 2): Met ?OT Short Term Goal 3 (Week 2): Pt will perform AAROM/PROM HEP for LUE with no more than MIN verbal cues ?OT Short Term Goal 3 - Progress (Week 2): Met ?OT Short Term  Goal 4 (Week 2): Pt will perform sit <> stand with LRAD with Mod A of 1 in prep for LB ADL ?OT Short Term Goal 4 - Progress (Week 2): Met ?Week 3:  OT Short Term Goal 2 (Week 3): STG=LTG 2/2 ELOS ? ?Patient met seated in recliner in agreement with OT treatment session. 0/10 pain reported at rest and with activity. Session with focus on therapeutic activity, NMR and patient/family education as detailed below. Patient able to self-propel wc to and from ADL apartment with cues for attention to LUE and L side of hallway. Patient drifting to L often. In ADL apartment focus shifted to simulation of ADL and bed transfers with patient completing squat-pivot to EOB, Min A for sit to supine and Min A for supine to EOB (in standard bed). Wife to order bed rail from [place] prior to return home. Patient then transferred to recliner with Min A via [place] transfer from wc. Wife actively participating in transfers cueing patient appropriately for attention to LUE and positioning LLE with assist from patient. Patient often attempts to complete transfer before spouse is ready requiring cues to increase communication prior to transfers. Transfer to drop-arm BSC with light Min A. Patient then able to stand from [facility] simulating hygiene/clothing management with non-affected RUE on simulated sink surface vs RW. Focus then shifted to patient/family education on LUE self-ROM to facilitate soft tissue elongation and decrease risk of contracture. Cues for pacing/form. Upon return to hospital room, spouse assisted patient back to recliner via squat-pivot transfer. Session concluded with patient seated in recliner with call bell within reach and all needs met.  ? ?  Therapy Documentation ?Precautions:  ?Precautions ?Precautions: Fall ?Precaution Comments: Flaccid Lt hemi, hx scoliosis, HTN ?Restrictions ?Weight Bearing Restrictions: No ?General: ?  ?Therapy/Group: Individual Therapy ? ?Hatsue Sime R Howerton-Davis ?05/05/2021, 6:58 AM  ?

## 2021-05-05 NOTE — Progress Notes (Signed)
Speech Language Pathology Daily Session Note ? ?Patient Details  ?Name: Miguel Mccullough ?MRN: [id number] ?Date of Birth: April 20, 1965 ? ?Today's Date: 05/05/2021 ?SLP Individual Time: 7824-2353 ?SLP Individual Time Calculation (min): 40 min ? ?Short Term Goals: ?Week 3: SLP Short Term Goal 1 (Week 3): Pt will complete complex problem solving tasks with Supervision A verbal and visual cues ?SLP Short Term Goal 2 (Week 3): Pt will complete higher level cognitive tasks (med management, money management, etc) with min A cues ?SLP Short Term Goal 3 (Week 3): Pt will complete moderately complex alternating attention tasks with min A cues ?SLP Short Term Goal 4 (Week 3): Patient will implement speech intelligibility strategies at the sentence level with Mod I verbal cues when fatigued ?SLP Short Term Goal 5 (Week 3): Pt will utilize compensatory memory strategies as trained to recall specific, functional information with Supervision A cues ? ?Skilled Therapeutic Interventions:Skilled ST services focused on cognitive skills. SLP facilitated reassessment of cognitive linguistic skills, utilizing CLQT. Pt scored moderate deficits in executive function, mild deficits in attention and visuospatial skills and [facility] for memory. SLP educated pt on improved memory and in general attention and visuospatial awareness, with need to focus on executive function skills (organizing, planning and executing.) pt agreed to targeted skills. Pt was left in room with call bell within reach and chair alarm set. SLP recommends to continue skilled services. ?   ? ?Pain ?Pain Assessment ?Pain Score: 0-No pain ? ?Therapy/Group: Individual Therapy ? ?Philisha Weinel ?05/05/2021, 2:55 PM ?

## 2021-05-05 NOTE — Progress Notes (Signed)
?                                                       PROGRESS NOTE ? ? ?Subjective/Complaints: ? ?No pain c/os , wife at bedside  ?DIscussed BP and medications as well as labs  ? ?ROS:  ? ?Pt denies SOB, abd pain, CP, N/V/C/D, and vision changes ? ? ? ? ? ? ? ? ?Objective: ?  ?No results found. ?Recent Labs  ?  05/05/21 ?0822  ?WBC 7.8  ?HGB 12.3*  ?HCT 36.9*  ?PLT 228  ? ? ? ?Recent Labs  ?  05/05/21 ?0822  ?NA 139  ?K 3.5  ?CL 105  ?CO2 23  ?GLUCOSE 153*  ?BUN 17  ?CREATININE 2.07*  ?CALCIUM 9.2  ? ? ? ? ?Intake/Output Summary (Last 24 hours) at 05/05/2021 1047 ?Last data filed at 05/05/2021 0700 ?Gross per 24 hour  ?Intake 960 ml  ?Output --  ?Net 960 ml  ? ?  ? ?  ? ?Physical Exam: ?Vital Signs ?Blood pressure (!) 153/94, pulse 68, temperature 98.6 ?F (37 ?C), resp. rate 16, height 5\' 4"  (1.626 m), weight 75.9 kg, SpO2 98 %. ? ? ?General: No acute distress ?Mood and affect are appropriate ?Heart: Regular rate and rhythm no rubs murmurs or extra sounds ?Lungs: Clear to auscultation, breathing unlabored, no rales or wheezes ?Abdomen: Positive bowel sounds, soft nontender to palpation, nondistended ?Extremities: No clubbing, cyanosis, or edema ?Skin: No evidence of breakdown, no evidence of rash ? ? ?Ext: no clubbing, cyanosis, 1-2+ edema of LLE -ankle/foot ?Musculoskeletal:  ?   Cervical back: Neck supple. No tenderness.  ? 0/5 on L side still ?Skin: ?   General: Skin is warm and dry.  ?Neurological:  ?   Mental Status: He is alert and oriented to person, place, and time.  ?   Comments: Mild left facial weakness with left inattention and mild left hemifacial sensory loss.  Speech clear and able to follow commands without difficulty. Dense left hemiplegia with sensory deficits. LUE /LLE 0/5 except for trace at foot. RUE and RLE 5/5. No motor/sensory changes today ?  ?  ? ?Assessment/Plan: ?1. Functional deficits which require 3+ hours per day of interdisciplinary therapy in a comprehensive inpatient rehab  setting. ?Physiatrist is providing close team supervision and 24 hour management of active medical problems listed below. ?Physiatrist and rehab team continue to assess barriers to discharge/monitor patient progress toward functional and medical goals ? ?Care Tool: ? ?Bathing ?   ?Body parts bathed by patient: Chest, Abdomen, Front perineal area, Right upper leg, Left upper leg, Face, Left arm, Right lower leg, Left lower leg  ? Body parts bathed by helper: Buttocks, Right arm, Right lower leg, Left lower leg ?  ?  ?Bathing assist Assist Level: Moderate Assistance - Patient 50 - 74% (Per family report; wife prefers to assist patient with bathing) ?  ?  ?Upper Body Dressing/Undressing ?Upper body dressing   ?What is the patient wearing?: Pull over shirt ?   ?Upper body assist Assist Level: Minimal Assistance - Patient > 75% ?   ?Lower Body Dressing/Undressing ?Lower body dressing ? ? ?   ?What is the patient wearing?: Pants, Underwear/pull up ? ?  ? ?Lower body assist Assist for lower body dressing: Moderate Assistance - Patient 50 -  74% ?   ? ?Toileting ?Toileting Toileting Activity did not occur (Probation officer and hygiene only): N/A (no void or bm)  ?Toileting assist Assist for toileting: Moderate Assistance - Patient 50 - 74% ?  ?  ?Transfers ?Chair/bed transfer ? ?Transfers assist ?   ? ?Chair/bed transfer assist level: Minimal Assistance - Patient > 75% ?  ?  ?Locomotion ?Ambulation ? ? ?Ambulation assist ? ?   ? ?Assist level: Minimal Assistance - Patient > 75% ?Assistive device: Other (comment) (hall rail) ?Max distance: 30  ? ?Walk 10 feet activity ? ? ?Assist ? Walk 10 feet activity did not occur: Safety/medical concerns ? ?Assist level: Minimal Assistance - Patient > 75% ?Assistive device: Other (comment) (hall rail)  ? ?Walk 50 feet activity ? ? ?Assist Walk 50 feet with 2 turns activity did not occur: Safety/medical concerns ? ?Assist level: 2 helpers ?Assistive device: Lite Gait  ? ? ?Walk 150 feet  activity ? ? ?Assist Walk 150 feet activity did not occur: Safety/medical concerns ? ?Assist level: 2 helpers ?Assistive device: Lite Gait ?  ? ?Walk 10 feet on uneven surface  ?activity ? ? ?Assist Walk 10 feet on uneven surfaces activity did not occur: Safety/medical concerns ? ? ?  ?   ? ?Wheelchair ? ? ? ? ?Assist Is the patient using a wheelchair?: Yes ?Type of Wheelchair: Manual ?  ? ?Wheelchair assist level: Supervision/Verbal cueing ?Max wheelchair distance: 90  ? ? ?Wheelchair 50 feet with 2 turns activity ? ? ? ?Assist ? ?  ?  ? ? ?Assist Level: Supervision/Verbal cueing  ? ?Wheelchair 150 feet activity  ? ? ? ?Assist ?   ? ? ?Assist Level: Supervision/Verbal cueing  ? ?Blood pressure (!) 153/94, pulse 68, temperature 98.6 ?F (37 ?C), resp. rate 16, height 5\' 4"  (1.626 m), weight 75.9 kg, SpO2 98 %. ? ?Medical Problem List and Plan: ?1. 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            -patient may  shower ?            -ELOS/Goals: 14-20 days- min A to supervision ? D/c date 3/30 ? Continue CIR- PT, OT and SLP ? ?2.  Antithrombotics: ?-DVT/anticoagulation:  lovenox ? -dopplers reviewed and negative ?            -antiplatelet therapy: N/A due to bleed.  ?3. Pain Management:: On tylenol as needed ?4. Mood: LCSW to follow for evaluation an dsup ?            -antipsychotic agents: N/A ?5. Neuropsych: This patient is capable of making decisions on his own behalf. ?6. Skin/Wound Care: Routine pressure relief measures.  ?7. Fluids/Electrolytes/Nutrition: Monitor I/O. Check CMET in am.  ?8. HTN: goal to keep SBP< 160 ? -3/5 bp remains poorly controlled ?            -- Catapress 2 mg TID, metoprolol increased to 75 mg BID and Norvasc 10 qd. ?  -will begin scheduled hydralazine 50mg  q8 ? -will use addnl hydralazine prn  ? 3/6- just added hydralazine- BP 165/98 this AM- will monitor and follow trend  ?3/9- BP 150s/99- will increase hydralazine to 100 mg TID ? 3/10- just  increased-hydralazine-  BP 150s/90s- wait to let it kick in ?3/15- BP slightly elevated, but doing much better- con't regimen ? 3/16- BP running 140s/80s- pulse in 60s so cannot increase B blocker-  ?2/59- BP 563O systolic-  on norvasc max dose and Clonidine 0.2 mg TID- and Hydralazine 100 mg TID- and Metoprolol 75 mg BID- but cannot increase due to borderline bradycardia. Will con't to monitor ?3/19- BP 152/95 this AM but 133/85 last night- change Norvasc to bedtime to try and help with AM increase in BP.  ?Vitals:  ? 05/04/21 2034 05/05/21 0605  ?BP:  (!) 153/94  ?Pulse: 60 68  ?Resp:  16  ?Temp:  98.6 ?F (37 ?C)  ?SpO2:  98%  ?Still elevated in am , Norvasc just changed yesterday , consider alpha blocker at night if am systolic elevation persists  ? ?9. Persistent hypokalemia: Start Kdur 20 meq daily. Check Mg level ? 3/3- Will replete 40 mEq x2 and recheck Monday- last Mg on 2/28 was 2.3 ? 3/6- K+ up to 3.6- con't to monitor ? 3/8- labs in AM ? 3/9- Mg 1.8 and K+ 3.5- will con't to monitor ? 3/12- labs Monday  ? 3/14- K+ 3.7- con't to monitor ? 3/16- K+ 3.5- still in normal range, although low- will ask pt to eat bananas, etc ?10.  Severe LVH: Has been referred to cardiology for work up. Continue metoprolol bid.  ?11. 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  ?--Renal ultrasound with mild evidence of medical renal disease ?            --encourage fluid intake. ?3/3- down slightly to 2.21- will avoid nephrotoxic agents ?3/9- Renal U/S normal- Cr up slightly to 1.99 and BUN 25- think it's leveling out- will con't to monitor ?3/13- Cr up slightly more to 2.08- and BUN 23- will recheck in Am and push fluids- also, might have infection/UTI which could affect Cr slightly? ? 3/14- U/A (-) and Cr up again to 2.12 and BUN 23- has (+) tenting- asked pt to drink 6-8 cups/day and will recheck Thursday ?3/16- Cr up to 1.95 and BUN 21- doing much better when drinks- con't  regimen ?  ?BMP Latest Ref Rng & Units 05/05/2021 05/01/2021 04/29/2021  ?Glucose 70 - 99 mg/dL 153(H) 84 83  ?BUN 6 - 20 mg/dL 17 21(H) 23(H)  ?Creatinine 0.61 - 1.24 mg/dL 2.07(H) 1.95(H) 2.12(H)  ?Sodium 135 - 145 mmo

## 2021-05-05 NOTE — Plan of Care (Signed)
?  Problem: Consults ?Goal: RH STROKE PATIENT EDUCATION ?Description: See Patient Education module for education specifics  ?Outcome: Progressing ?  ?Problem: RH SAFETY ?Goal: RH STG ADHERE TO SAFETY PRECAUTIONS W/ASSISTANCE/DEVICE ?Description: STG Adhere to Safety Precautions With Cues and Reminders. ?Outcome: Progressing ?Goal: RH STG DECREASED RISK OF FALL WITH ASSISTANCE ?Description: STG Decreased Risk of Fall With [employer]. ?Outcome: Progressing ?  ?Problem: RH SAFETY ?Goal: RH STG DECREASED RISK OF FALL WITH ASSISTANCE ?Description: STG Decreased Risk of Fall With [employer]. ?Outcome: Progressing ?  ?Problem: RH COGNITION-NURSING ?Goal: RH STG ANTICIPATES NEEDS/CALLS FOR ASSIST W/ASSIST/CUES ?Description: STG Anticipates Needs/Calls for Assist With Cues and Reminders. ?Outcome: Progressing ?  ?Problem: RH KNOWLEDGE DEFICIT ?Goal: RH STG INCREASE KNOWLEDGE OF HYPERTENSION ?Description: Patient will demonstrate knowledge of HTN medications, dietary restrictions, and BP parameters with educational materials and handouts provided by staff independently at discharge. ?Outcome: Progressing ?Goal: RH STG INCREASE KNOWLEGDE OF HYPERLIPIDEMIA ?Description: Patient will demonstrate knowledge of HLD medications, dietary restrictions, and important lab values with educational materials and handouts provided by staff independently at discharge. ?Outcome: Progressing ?  ?

## 2021-05-05 NOTE — Progress Notes (Signed)
Physical Therapy Session Note ? ?Patient Details  ?Name: Miguel Mccullough ?MRN: [id number] ?Date of Birth: [birth year] ? ?Today's Date: 05/05/2021 ?PT Individual Time: 2863-8177 ?PT Individual Time Calculation (min): 48 min  ? ?Short Term Goals: ?Week 1:  PT Short Term Goal 1 (Week 1): Pt will perform bed to wc transfers to R w/min assist and light cues, to L w/mod to max assist of 1 ?PT Short Term Goal 1 - Progress (Week 1): Met ?PT Short Term Goal 2 (Week 1): Pt will sit statically w/feet supported w/cga ?PT Short Term Goal 2 - Progress (Week 1): Met ?PT Short Term Goal 3 (Week 1): Pt will ambulate up to 1f w/LRAD and 2 person assist ?PT Short Term Goal 3 - Progress (Week 1): Met ?PT Short Term Goal 4 (Week 1): Pt will propel wc 248fw/R exts and moderate cueing to attend to L environment. ?PT Short Term Goal 4 - Progress (Week 1): Met ?Week 2:  PT Short Term Goal 1 (Week 2): Pt will self propel w/c with hemi technique with supervision and no cues ?PT Short Term Goal 1 - Progress (Week 2): Met ?PT Short Term Goal 2 (Week 2): Pt will initiate standing transfer training ?PT Short Term Goal 2 - Progress (Week 2): Progressing toward goal ?PT Short Term Goal 3 (Week 2): Pt will complete bed mobility with supervision ?PT Short Term Goal 3 - Progress (Week 2): Met ? ?Skilled Therapeutic Interventions/Progress Updates:  ?  Pt received in recliner and agreeable to unscheduled therapy.  No complaint of pain. Pt reports his legs are extremely tired from previous sessions, so session focused on w/c mobility and community reintegration. Squat pivot recliner>w/c with recliner armrest in place to mimic home chair, min A. Pt propelled w/c with R hemi technique to [employer] [facility]/atrium, >500 ft. Pt propelled w/c around gift shop, navigating tight spaces, only occ misjudging distances and bumping into items on L side. Pt located 4 x 3 items in gift shop for cognitive memory and visual scanning task. Recalled all items and was able to  locate quickly in store.  Pt also reached outside BOS with R hand to tap each item. Pt then propelled to sunny location in atrium for holistic benefit of sunshine and different environment. Pt conversed freely about his children and work and demoed improved mood after session. Pt then returned to room in the same manner and set up transfer with supervision. Squat pivot back to recliner with min A and pt was positioned therapeutically with pillows and was left with all needs in reach and alarm active.  ? ?Therapy Documentation ?Precautions:  ?Precautions ?Precautions: Fall ?Precaution Comments: Flaccid Lt hemi, hx scoliosis, HTN ?Restrictions ?Weight Bearing Restrictions: No ?General: ?  ?  ? ? ? ?Therapy/Group: Individual Therapy ? ?OlHampton3/20/2023, 3:34 PM  ?

## 2021-05-06 MED ORDER — CLONIDINE HCL 0.3 MG/24HR TD PTWK
0.3000 mg | MEDICATED_PATCH | TRANSDERMAL | Status: DC
  Administered 2021-05-06 – 2021-05-13 (×2): 0.3 mg via TRANSDERMAL
  Filled 2021-05-06 (×2): qty 1

## 2021-05-06 NOTE — Progress Notes (Signed)
Speech Language Pathology Daily Session Note ? ?Patient Details  ?Name: Miguel Mccullough ?MRN: [id number] ?Date of Birth: 05/23/1965 ? ?Today's Date: 05/06/2021 ?SLP Individual Time: 0900-1000 ?SLP Individual Time Calculation (min): 60 min ? ?Short Term Goals: ?Week 3: SLP Short Term Goal 1 (Week 3): Pt will complete complex problem solving tasks with Supervision A verbal and visual cues ?SLP Short Term Goal 2 (Week 3): Pt will complete higher level cognitive tasks (med management, money management, etc) with min A cues ?SLP Short Term Goal 3 (Week 3): Pt will complete moderately complex alternating attention tasks with min A cues ?SLP Short Term Goal 4 (Week 3): Patient will implement speech intelligibility strategies at the sentence level with Mod I verbal cues when fatigued ?SLP Short Term Goal 5 (Week 3): Pt will utilize compensatory memory strategies as trained to recall specific, functional information with Supervision A cues ? ?Skilled Therapeutic Interventions:Skilled ST services focused on cognitive skills. SLP facilitated executive function skills utilizing structured deductive reasoning puzzles x4 and reasoning paragraphs from [place]. Pt required min A fade to supervision A verbal cues for structured deductive reasoning and reasoning paragraph, however when SLP reduced the structure (prioritizing where to start, note taking...etc.) pt required min-supervision A verbal cues. Pt was left in room with call bell within reach and chair alarm set. SLP recommends to continue skilled services. ? ?   ? ?Pain ?Pain Assessment ?Pain Score: 0-No pain ? ?Therapy/Group: Individual Therapy ? ?[name] ?05/06/2021, 11:58 AM ?

## 2021-05-06 NOTE — Progress Notes (Signed)
Physical Therapy Session Note ? ?Patient Details  ?Name: Miguel Mccullough ?MRN: [id number] ?Date of Birth: 10-31-1965 ? ?Today's Date: 05/06/2021 ?PT Individual Time: 1638-4536 ?PT Individual Time Calculation (min): 72 min  ? ?Short Term Goals: ?Week 1:  PT Short Term Goal 1 (Week 1): Pt will perform bed to wc transfers to R w/min assist and light cues, to L w/mod to max assist of 1 ?PT Short Term Goal 1 - Progress (Week 1): Met ?PT Short Term Goal 2 (Week 1): Pt will sit statically w/feet supported w/cga ?PT Short Term Goal 2 - Progress (Week 1): Met ?PT Short Term Goal 3 (Week 1): Pt will ambulate up to 32f w/LRAD and 2 person assist ?PT Short Term Goal 3 - Progress (Week 1): Met ?PT Short Term Goal 4 (Week 1): Pt will propel wc 234fw/R exts and moderate cueing to attend to L environment. ?PT Short Term Goal 4 - Progress (Week 1): Met ?Week 2:  PT Short Term Goal 1 (Week 2): Pt will self propel w/c with hemi technique with supervision and no cues ?PT Short Term Goal 1 - Progress (Week 2): Met ?PT Short Term Goal 2 (Week 2): Pt will initiate standing transfer training ?PT Short Term Goal 2 - Progress (Week 2): Progressing toward goal ?PT Short Term Goal 3 (Week 2): Pt will complete bed mobility with supervision ?PT Short Term Goal 3 - Progress (Week 2): Met ? ?Skilled Therapeutic Interventions/Progress Updates:  ?  Pt seated in w/c on arrival and agreeable to therapy. No complaint of pain.  ?Pt propelled chair with R hemi technique to 4th floor ortho gym. Occ bumping objects on L side, possibly d/t to lap tray.  ?Pt performed side steps 3 x 8 ft, mod-max A to advance LLE and min A knee block, facilitation for hip extension. Occ small LOB with min A to recover. During last bout, pt able to maintain L knee position while stepping without buckling with CGA only. ?Transitioned to gait training with RW x 4 bouts, total of 110 feet in 4 bouts of 20-35 feet. Mod-max A for LLE advancement, facilitation for weight shift and RW  management, VC for posture, sequencing, and increasing R step length.  ?Pt then propelled chair back to room and set up for transfer. Pt then performed Stand pivot transfer with RW to recliner with mod Max A for LLE advancement and balance. Sit to stand throughout session with min or better to RW. Pt set up therapeutically with pillows and was left with all needs in reach and alarm active.  ? ? ?Therapy Documentation ?Precautions:  ?Precautions ?Precautions: Fall ?Precaution Comments: Flaccid Lt hemi, hx scoliosis, HTN ?Restrictions ?Weight Bearing Restrictions: No ?General: ?  ? ? ? ? ?Therapy/Group: Individual Therapy ? ?OlSan Lorenzo3/21/2023, 10:36 AM  ?

## 2021-05-06 NOTE — Progress Notes (Signed)
Occupational Therapy Session Note ? ?Patient Details  ?Name: Miguel Mccullough ?MRN: [id number] ?Date of Birth: 1965-12-15 ? ?Today's Date: 05/06/2021 ?OT Individual Time: 7425-9563 ?OT Individual Time Calculation (min): 59 min  ? ?OT Individual Time: 8756-4332  ?OT Individual Time Calculation (min): 29 min  ? ? ?Short Term Goals: ?Week 1:  OT Short Term Goal 1 (Week 1): Pt will thread Lt UE into shirt with Mod A and vcs to improve Lt attention ?OT Short Term Goal 1 - Progress (Week 1): Progressing toward goal ?OT Short Term Goal 2 (Week 1): Pt will verbalize 2 joint protection strategies to use during bed mobility and/or transfers to improve Lt attention ?OT Short Term Goal 2 - Progress (Week 1): Progressing toward goal ?OT Short Term Goal 3 (Week 1): Education will be provided to pts wife in regards to 2 hemi positioning strategies to implement at home for caregiver education ?OT Short Term Goal 3 - Progress (Week 1): Met ?Week 2:  OT Short Term Goal 1 (Week 2): Pt will verbalize 2 joint protection strategies to use during bed mobility and/or transfers to improve Lt attention ?OT Short Term Goal 1 - Progress (Week 2): Met ?OT Short Term Goal 2 (Week 2): Pt will thread Lt UE into shirt with Mod A and vcs to improve Lt attention ?OT Short Term Goal 2 - Progress (Week 2): Met ?OT Short Term Goal 3 (Week 2): Pt will perform AAROM/PROM HEP for LUE with no more than MIN verbal cues ?OT Short Term Goal 3 - Progress (Week 2): Met ?OT Short Term Goal 4 (Week 2): Pt will perform sit <> stand with LRAD with Mod A of 1 in prep for LB ADL ?OT Short Term Goal 4 - Progress (Week 2): Met ?Week 3:  OT Short Term Goal 2 (Week 3): STG=LTG 2/2 ELOS ? ?Skilled Therapeutic Interventions/Progress Updates:  ?Session 1: Patient met seated in wc in agreement with OT treatment session. 0/10 pain reported at rest and with activity. Patient able to self-propel wc to rehab gym on 4th floor with Min cues for attention to L. Squat-pivot to mat table  with CGA. Focus shifted to LUE NMR with NMES on triceps to improve static standing in prep for ADLs. Please refer below for additional details. After 10 min, e-stim placed on wrist and digit flexors with gross grasp. Max A to grasp/release cones and move from R side of tray table to L. Wife provided measurements for bed at home reporting 21 1/2 inches from floor to top of mattress. Min A for safety for squat pivot from mat table (21 1/2 inches off ground) to wc. Total A for wc transport back to room for time management. Session concluded with patient seated in wc with call bell within reach, belt alarm activated and all needs met.  ? ?Session 2: Patient met seated in wc in agreement with OT treatment session. 0/10 pain reported at rest and with activity. Short treatment session with focus on patient/family ed on LUE NMR with written HEP. Patient able to complete self-ROM exercises with cues for pacing/form and Min cues for attention to task. Wife, daughter and granddaughter present at beside. Session concluded with patient seated in wc with call bell within reach and all needs met.  ? ?Therapy Documentation ?Precautions:  ?Precautions ?Precautions: Fall ?Precaution Comments: Flaccid Lt hemi, hx scoliosis, HTN ?Restrictions ?Weight Bearing Restrictions: No ?General: ?  ?Therapy/Group: Individual Therapy ? ?Edy Mcbane R [place] ?05/06/2021, 1:26 PM ?

## 2021-05-06 NOTE — Patient Care Conference (Signed)
Inpatient RehabilitationTeam Conference and Plan of Care Update ?Date: 05/06/2021   Time: 11:42 AM  ? ? ?Patient Name: Miguel Mccullough      ?Medical Record Number: [id number]  ?Date of Birth: 1965-10-18 ?Sex: Male         ?Room/Bed: 5C07C/5C07C-01 ?Payor Info: Payor: /   ? ?Admit Date/Time:  04/17/2021  5:15 PM ? ?Primary Diagnosis:  ICH (intracerebral hemorrhage) ([place]) ? ?Hospital Problems: Principal Problem: ?  ICH (intracerebral hemorrhage) ([place]) ? ? ? ?Expected Discharge Date: Expected Discharge Date: 05/15/21 ? ?Team Members Present: ?Physician leading conference: Dr. Courtney Heys ?Social Worker Present: Ovidio Kin, LCSW ?Nurse Present: Dorthula Nettles, RN ?PT Present: Ailene Rud, PT ?OT Present: Turner Daniels, OT ?SLP Present: Charolett Bumpers, SLP ?PPS Coordinator present : Gunnar Fusi, SLP ? ?   Current Status/Progress Goal Weekly Team Focus  ?Bowel/Bladder ? ? continent B/B LBM 3/19  Remain continent B/B  Assess q shift and PRN   ?Swallow/Nutrition/ Hydration ? ? mod I  mod I - goal met      ?ADL's ? ? Per wife report patient requiring Mod A overall for bathing and LB dressing. Min A to don UB clothing. Completes squat-pivot transers with CGA.  Min A overall  Functional transfers, sitting/standing balnace, patient/family ed., self-ROM ed, hemi technique   ?Mobility ? ? CGA squat pivot, mod A short distance gait with RW  min A, supervision w/c  gait training, side steps, w/c mobility   ?Communication ? ? supervision A-mod I  mod I  education and carryover of strategies when fatigued   ?Safety/Cognition/ Behavioral Observations ? min-supervision A  Mod I - Supervision A  education, alternating attention, complex problem solving, left side awareness and functional recall   ?Pain ? ? denies pain  Remain 0 out of 10  Assess q shift and treat PRN   ?Skin ? ? Skin intact  remain free of skin breakdown  Assess skin q shift and PRN   ? ? ?Discharge Planning:  ?Wife participating with his ADL's and aware of  his current level. Working on DME and follow up due to no insurance   ?Team Discussion: ?Experiencing more spasticity, issues with external rotation. Clonidine patch added, BP still an issue. Cr 2.07. added Baclofen, started Ritalin, seems to help. Continent B/B, no reported pain, sleeping well. LUE remains flaccid. Discharging home with spouse and daughter. E-stem to left shoulder. Able to shrug left shoulder. Will need RW, WC with lap tray, and BSC. ? ?Patient on target to meet rehab goals: ?yes, min assist. LE spasticity improving more than upper. Needs lots of assist to move leg. Mod assist lower body, min assist upper body. Working on standing and squat pivot transfers. Medications have helped with SLP. Mod assist with executive function. Problem solving improving. Left inattention improving. ? ?*See Care Plan and progress notes for long and short-term goals.  ? ?Revisions to Treatment Plan:  ?Adjusting medications, monitoring labs. ?  ?Teaching Needs: ?Family education, medication management, safety awareness, transfer/gait training, etc. ?  ?Current Barriers to Discharge: ?Decreased caregiver support, Home enviroment access/layout, Lack of/limited family support, and insurance. ? ?Possible Resolutions to Barriers: ?Family education ?Order recommended DME ?Follow-up HH/Outpatient ?  ? ? Medical Summary ?Current Status: ritalin in afternoon for attention- BP still elevated- continent B/B; sleeping OK- spasticity getting worse. skin ok ? Barriers to Discharge: Home enviroment access/layout;Weight bearing restrictions;Medical stability ? Barriers to Discharge Comments: no insurance- need to get w/c with lap tray and RW, BSC;  size 18? ?Possible Resolutions to [employer]: walking- return in leg is occuring!-increase in CLonidine to patch 0.3 mg- no return in LUE- will see if can afford Clonidine patch?- Safer to use- to reduce risk of rebound HTN if misses a dose- estim in L shoulder- added baclofen for  spasticity- will likely need Botox- flexor tone- moderate deficits in executive function- but with tasks, min A- problem solving much better- occ L negl3ect noted on tasks- d/c 3/30 ? ? ?Continued Need for Acute Rehabilitation Level of Care: The patient requires daily medical management by a physician with specialized training in physical medicine and rehabilitation for the following reasons: ?Direction of a multidisciplinary physical rehabilitation program to maximize functional independence : Yes ?Medical management of patient stability for increased activity during participation in an intensive rehabilitation regime.: Yes ?Analysis of laboratory values and/or radiology reports with any subsequent need for medication adjustment and/or medical intervention. : Yes ? ? ?I attest that I was present, lead the team conference, and concur with the assessment and plan of the team. ? ? ?Dorthula Nettles G ?05/06/2021, 5:21 PM  ? ? ? ? ? ? ?

## 2021-05-06 NOTE — Progress Notes (Signed)
?                                                       PROGRESS NOTE ? ? ?Subjective/Complaints: ? ? ?Denies pain ?LBM 2 days ago- ? ? ?ROS:  ? ? ?Pt denies SOB, abd pain, CP, N/V/C/D, and vision changes ? ? ? ? ? ? ? ? ?Objective: ?  ?No results found. ?Recent Labs  ?  05/05/21 ?0822  ?WBC 7.8  ?HGB 12.3*  ?HCT 36.9*  ?PLT 228  ? ? ? ?Recent Labs  ?  05/05/21 ?0822  ?NA 139  ?K 3.5  ?CL 105  ?CO2 23  ?GLUCOSE 153*  ?BUN 17  ?CREATININE 2.07*  ?CALCIUM 9.2  ? ? ? ? ?Intake/Output Summary (Last 24 hours) at 05/06/2021 3329 ?Last data filed at 05/06/2021 0700 ?Gross per 24 hour  ?Intake 240 ml  ?Output --  ?Net 240 ml  ?  ? ?  ? ?Physical Exam: ?Vital Signs ?Blood pressure (!) 155/95, pulse 67, temperature 98.5 ?F (36.9 ?C), temperature source Oral, resp. rate 18, height 5\' 4"  (1.626 m), weight 75.9 kg, SpO2 98 %. ? ? ? ?General: awake, alert, appropriate, sitting up in bedside chair; wife at bedside;  NAD ?HENT: conjugate gaze; oropharynx moist ?CV: regular rate; no JVD ?Pulmonary: CTA B/L; no W/R/R- good air movement ?GI: soft, NT, ND, (+)BS ?Psychiatric: appropriate ?Neurological: Ox3 ?Sustained clonus LLE- Hoffman's LUE- ?MAS of 1+ in L elbow 1+ to 2 in L shoulder- with some mild <5 degrees hard to do full L elbow extension and external rotation of L shoulder ?Ext: no clubbing, cyanosis, 1-2+ edema of LLE -ankle/foot ?Musculoskeletal:  ?   Cervical back: Neck supple. No tenderness.  ? 0/5 on L side still ?Skin: ?   General: Skin is warm and dry.  ?Neurological:  ?   Mental Status: He is alert and oriented to person, place, and time.  ?   Comments: Mild left facial weakness with left inattention and mild left hemifacial sensory loss.  Speech clear and able to follow commands without difficulty. Dense left hemiplegia with sensory deficits. LUE /LLE 0/5 except for trace at foot. RUE and RLE 5/5. No motor/sensory changes today ?  ?  ? ?Assessment/Plan: ?1. Functional deficits which require 3+ hours per day of  interdisciplinary therapy in a comprehensive inpatient rehab setting. ?Physiatrist is providing close team supervision and 24 hour management of active medical problems listed below. ?Physiatrist and rehab team continue to assess barriers to discharge/monitor patient progress toward functional and medical goals ? ?Care Tool: ? ?Bathing ?   ?Body parts bathed by patient: Chest, Abdomen, Front perineal area, Right upper leg, Left upper leg, Face, Left arm, Right lower leg, Left lower leg  ? Body parts bathed by helper: Buttocks, Right arm, Right lower leg, Left lower leg ?  ?  ?Bathing assist Assist Level: Moderate Assistance - Patient 50 - 74% (Per family report; wife prefers to assist patient with bathing) ?  ?  ?Upper Body Dressing/Undressing ?Upper body dressing   ?What is the patient wearing?: Pull over shirt ?   ?Upper body assist Assist Level: Minimal Assistance - Patient > 75% ?   ?Lower Body Dressing/Undressing ?Lower body dressing ? ? ?   ?What is the patient wearing?: Pants, Underwear/pull up ? ?  ? ?  Lower body assist Assist for lower body dressing: Moderate Assistance - Patient 50 - 74% ?   ? ?Toileting ?Toileting Toileting Activity did not occur (Probation officer and hygiene only): N/A (no void or bm)  ?Toileting assist Assist for toileting: Moderate Assistance - Patient 50 - 74% ?  ?  ?Transfers ?Chair/bed transfer ? ?Transfers assist ?   ? ?Chair/bed transfer assist level: Minimal Assistance - Patient > 75% ?  ?  ?Locomotion ?Ambulation ? ? ?Ambulation assist ? ?   ? ?Assist level: Minimal Assistance - Patient > 75% ?Assistive device: Other (comment) (hall rail) ?Max distance: 30  ? ?Walk 10 feet activity ? ? ?Assist ? Walk 10 feet activity did not occur: Safety/medical concerns ? ?Assist level: Minimal Assistance - Patient > 75% ?Assistive device: Other (comment) (hall rail)  ? ?Walk 50 feet activity ? ? ?Assist Walk 50 feet with 2 turns activity did not occur: Safety/medical concerns ? ?Assist  level: 2 helpers ?Assistive device: Lite Gait  ? ? ?Walk 150 feet activity ? ? ?Assist Walk 150 feet activity did not occur: Safety/medical concerns ? ?Assist level: 2 helpers ?Assistive device: Lite Gait ?  ? ?Walk 10 feet on uneven surface  ?activity ? ? ?Assist Walk 10 feet on uneven surfaces activity did not occur: Safety/medical concerns ? ? ?  ?   ? ?Wheelchair ? ? ? ? ?Assist Is the patient using a wheelchair?: Yes ?Type of Wheelchair: Manual ?  ? ?Wheelchair assist level: Supervision/Verbal cueing ?Max wheelchair distance: 90  ? ? ?Wheelchair 50 feet with 2 turns activity ? ? ? ?Assist ? ?  ?  ? ? ?Assist Level: Supervision/Verbal cueing  ? ?Wheelchair 150 feet activity  ? ? ? ?Assist ?   ? ? ?Assist Level: Supervision/Verbal cueing  ? ?Blood pressure (!) 155/95, pulse 67, temperature 98.5 ?F (36.9 ?C), temperature source Oral, resp. rate 18, height 5\' 4"  (1.626 m), weight 75.9 kg, SpO2 98 %. ? ?Medical Problem List and Plan: ?1. 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            -patient may  shower ?            -ELOS/Goals: 14-20 days- min A to supervision ? D/c date 3/30 ? Continue CIR- PT, OT and SLP ? Team conference to f/u on progress ?2.  Antithrombotics: ?-DVT/anticoagulation:  lovenox ? -dopplers reviewed and negative ?            -antiplatelet therapy: N/A due to bleed.  ?3. Pain Management:: On tylenol as needed ?4. Mood: LCSW to follow for evaluation an dsup ?            -antipsychotic agents: N/A ?5. Neuropsych: This patient is capable of making decisions on his own behalf. ?6. Skin/Wound Care: Routine pressure relief measures.  ?7. Fluids/Electrolytes/Nutrition: Monitor I/O. Check CMET in am.  ?8. HTN: goal to keep SBP< 160 ? -3/5 bp remains poorly controlled ?            -- Catapress 2 mg TID, metoprolol increased to 75 mg BID and Norvasc 10 qd. ?  -will begin scheduled hydralazine 50mg  q8 ? -will use addnl hydralazine prn  ? 3/6- just added  hydralazine- BP 165/98 this AM- will monitor and follow trend  ?3/9- BP 150s/99- will increase hydralazine to 100 mg TID ? 3/10- just increased-hydralazine-  BP 150s/90s- wait to let it kick in ?3/15- BP slightly elevated, but  doing much better- con't regimen ? 3/16- BP running 140s/80s- pulse in 60s so cannot increase B blocker-  ?7/90- BP 240X systolic- on norvasc max dose and Clonidine 0.2 mg TID- and Hydralazine 100 mg TID- and Metoprolol 75 mg BID- but cannot increase due to borderline bradycardia. Will con't to monitor ?3/19- BP 152/95 this AM but 133/85 last night- change Norvasc to bedtime to try and help with AM increase in BP.  ?Vitals:  ? 05/05/21 1946 05/06/21 0610  ?BP: 140/82 (!) 155/95  ?Pulse: 63 67  ?Resp: 18 18  ?Temp: 97.7 ?F (36.5 ?C) 98.5 ?F (36.9 ?C)  ?SpO2: 99% 98%  ?Still elevated in am , Norvasc just changed yesterday , consider alpha blocker at night if am systolic elevation persists  ?3/21- change clonidine to 0.3 mg patch q 7 days and stop oral clonidine.  ?9. Persistent hypokalemia: Start Kdur 20 meq daily. Check Mg level ? 3/3- Will replete 40 mEq x2 and recheck Monday- last Mg on 2/28 was 2.3 ? 3/6- K+ up to 3.6- con't to monitor ? 3/8- labs in AM ? 3/9- Mg 1.8 and K+ 3.5- will con't to monitor ? 3/12- labs Monday  ? 3/14- K+ 3.7- con't to monitor ? 3/16- K+ 3.5- still in normal range, although low- will ask pt to eat bananas, etc ?10.  Severe LVH: Has been referred to cardiology for work up. Continue metoprolol bid.  ?11. 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  ?--Renal ultrasound with mild evidence of medical renal disease ?            --encourage fluid intake. ?3/3- down slightly to 2.21- will avoid nephrotoxic agents ?3/9- Renal U/S normal- Cr up slightly to 1.99 and BUN 25- think it's leveling out- will con't to monitor ?3/13- Cr up slightly more to 2.08- and BUN 23- will recheck in Am and push fluids- also, might have infection/UTI  which could affect Cr slightly? ? 3/14- U/A (-) and Cr up again to 2.12 and BUN 23- has (+) tenting- asked pt to drink 6-8 cups/day and will recheck Thursday ?3/16- Cr up to 1.95 and BUN 21- doing much better when drinks

## 2021-05-07 NOTE — Progress Notes (Signed)
Occupational Therapy Session Note ? ?Patient Details  ?Name: Miguel Mccullough ?MRN: [id number] ?Date of Birth: 1966-01-03 ? ?Today's Date: 05/07/2021 ?OT Individual Time: 9672-2773 ?OT Individual Time Calculation (min): 55 min  ? ? ?Short Term Goals: ?Week 1:  OT Short Term Goal 1 (Week 1): Pt will thread Lt UE into shirt with Mod A and vcs to improve Lt attention ?OT Short Term Goal 1 - Progress (Week 1): Progressing toward goal ?OT Short Term Goal 2 (Week 1): Pt will verbalize 2 joint protection strategies to use during bed mobility and/or transfers to improve Lt attention ?OT Short Term Goal 2 - Progress (Week 1): Progressing toward goal ?OT Short Term Goal 3 (Week 1): Education will be provided to pts wife in regards to 2 hemi positioning strategies to implement at home for caregiver education ?OT Short Term Goal 3 - Progress (Week 1): Met ? ?Skilled Therapeutic Interventions/Progress Updates:  ?   ?Pt received in w.c ready to go and no pain. Pt propels w/c with supervision to elevator with no cuing needed for L attention this date, and then transported total A for time management rest of session. ?Therapeutic activity ?Supine, seated and sidelying therex with focus/facilitation of scap mobility in all directions while completing AAROM to find activation. Best activation of LUE in supine with UE at 80-90* flexion at shoulder supported pt able to activate pec, bicep and tricep. Pt able to stop hand falling towards face with triceps quivering after ~45* and hold isometrically 2-5sec before falling again slowly towards face. Pt able ot "do a pec fly" with pec and ant delt activating in end ROM. Pt barrier L attention to UE impacting strength and sustaining contraction. ? ?Pt left at end of session in recliner with wife in room , call light in reach and all needs met ? ? ?Therapy Documentation ?Precautions:  ?Precautions ?Precautions: Fall ?Precaution Comments: Flaccid Lt hemi, hx scoliosis, HTN ?Restrictions ?Weight  Bearing Restrictions: No ?General: ?  ? ?Therapy/Group: Individual Therapy ? ?Lowella Dell Jennel Mara ?05/07/2021, 6:52 AM ?

## 2021-05-07 NOTE — Progress Notes (Signed)
?                                                       PROGRESS NOTE ? ? ?Subjective/Complaints: ? ?Pt reports did ROM this Am and yesterday- therapy showed him how to do ROM.  ? ?Ate almost all tray.  ?No other issues- no pain; bowels going daily.  ? ? ?ROS:  ? ? ?Pt denies SOB, abd pain, CP, N/V/C/D, and vision changes ? ? ? ? ? ? ? ? ?Objective: ?  ?No results found. ?Recent Labs  ?  05/05/21 ?0822  ?WBC 7.8  ?HGB 12.3*  ?HCT 36.9*  ?PLT 228  ? ? ? ?Recent Labs  ?  05/05/21 ?0822  ?NA 139  ?K 3.5  ?CL 105  ?CO2 23  ?GLUCOSE 153*  ?BUN 17  ?CREATININE 2.07*  ?CALCIUM 9.2  ? ? ? ? ?Intake/Output Summary (Last 24 hours) at 05/07/2021 0801 ?Last data filed at 05/06/2021 2101 ?Gross per 24 hour  ?Intake 480 ml  ?Output --  ?Net 480 ml  ?  ? ?  ? ?Physical Exam: ?Vital Signs ?Blood pressure (!) 148/81, pulse 71, temperature 98.5 ?F (36.9 ?C), resp. rate 17, height 5\' 4"  (1.626 m), weight 75.9 kg, SpO2 98 %. ? ? ? ? ?General: awake, alert, appropriate, sitting up in bedside chair; wife at bedside;  NAD ?HENT: conjugate gaze; oropharynx moist ?CV: regular rate; no JVD ?Pulmonary: CTA B/L; no W/R/R- good air movement ?GI: soft, NT, ND, (+)BS ?Psychiatric: appropriate ?Neurological: Ox3- just higher level cognition ?MAS of 2 in L shoulder and 1+ in L elbow ?Ext: no clubbing, cyanosis, 1-2+ edema of LLE -ankle/foot ?Musculoskeletal:  ?   Cervical back: Neck supple. No tenderness.  ? 0/5 on L side still ?Skin: ?   General: Skin is warm and dry.  ?Neurological:  ?   Mental Status: He is alert and oriented to person, place, and time.  ?   Comments: Mild left facial weakness with left inattention and mild left hemifacial sensory loss.  Speech clear and able to follow commands without difficulty. Dense left hemiplegia with sensory deficits. LUE /LLE 0/5 except for trace at foot. RUE and RLE 5/5. No motor/sensory changes today ?  ?  ? ?Assessment/Plan: ?1. Functional deficits which require 3+ hours per day of interdisciplinary  therapy in a comprehensive inpatient rehab setting. ?Physiatrist is providing close team supervision and 24 hour management of active medical problems listed below. ?Physiatrist and rehab team continue to assess barriers to discharge/monitor patient progress toward functional and medical goals ? ?Care Tool: ? ?Bathing ?   ?Body parts bathed by patient: Chest, Abdomen, Front perineal area, Right upper leg, Left upper leg, Face, Left arm, Right lower leg, Left lower leg  ? Body parts bathed by helper: Buttocks, Right arm, Right lower leg, Left lower leg ?  ?  ?Bathing assist Assist Level: Moderate Assistance - Patient 50 - 74% (Per family report; wife prefers to assist patient with bathing) ?  ?  ?Upper Body Dressing/Undressing ?Upper body dressing   ?What is the patient wearing?: Pull over shirt ?   ?Upper body assist Assist Level: Minimal Assistance - Patient > 75% ?   ?Lower Body Dressing/Undressing ?Lower body dressing ? ? ?   ?What is the patient wearing?: Pants, Underwear/pull up ? ?  ? ?  Lower body assist Assist for lower body dressing: Moderate Assistance - Patient 50 - 74% ?   ? ?Toileting ?Toileting Toileting Activity did not occur (Probation officer and hygiene only): N/A (no void or bm)  ?Toileting assist Assist for toileting: Moderate Assistance - Patient 50 - 74% ?  ?  ?Transfers ?Chair/bed transfer ? ?Transfers assist ?   ? ?Chair/bed transfer assist level: Minimal Assistance - Patient > 75% ?  ?  ?Locomotion ?Ambulation ? ? ?Ambulation assist ? ?   ? ?Assist level: Minimal Assistance - Patient > 75% ?Assistive device: Other (comment) (hall rail) ?Max distance: 30  ? ?Walk 10 feet activity ? ? ?Assist ? Walk 10 feet activity did not occur: Safety/medical concerns ? ?Assist level: Minimal Assistance - Patient > 75% ?Assistive device: Other (comment) (hall rail)  ? ?Walk 50 feet activity ? ? ?Assist Walk 50 feet with 2 turns activity did not occur: Safety/medical concerns ? ?Assist level: 2  helpers ?Assistive device: Lite Gait  ? ? ?Walk 150 feet activity ? ? ?Assist Walk 150 feet activity did not occur: Safety/medical concerns ? ?Assist level: 2 helpers ?Assistive device: Lite Gait ?  ? ?Walk 10 feet on uneven surface  ?activity ? ? ?Assist Walk 10 feet on uneven surfaces activity did not occur: Safety/medical concerns ? ? ?  ?   ? ?Wheelchair ? ? ? ? ?Assist Is the patient using a wheelchair?: Yes ?Type of Wheelchair: Manual ?  ? ?Wheelchair assist level: Supervision/Verbal cueing ?Max wheelchair distance: 90  ? ? ?Wheelchair 50 feet with 2 turns activity ? ? ? ?Assist ? ?  ?  ? ? ?Assist Level: Supervision/Verbal cueing  ? ?Wheelchair 150 feet activity  ? ? ? ?Assist ?   ? ? ?Assist Level: Supervision/Verbal cueing  ? ?Blood pressure (!) 148/81, pulse 71, temperature 98.5 ?F (36.9 ?C), resp. rate 17, height 5\' 4"  (1.626 m), weight 75.9 kg, SpO2 98 %. ? ?Medical Problem List and Plan: ?1. 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            -patient may  shower ?            -ELOS/Goals: 14-20 days- min A to supervision ? D/c date 3/30 ? Con't CIR- PT, OT and SLP ?2.  Antithrombotics: ?-DVT/anticoagulation:  lovenox ? -dopplers reviewed and negative ?            -antiplatelet therapy: N/A due to bleed.  ?3. Pain Management:: On tylenol as needed ?4. Mood: LCSW to follow for evaluation an dsup ?            -antipsychotic agents: N/A ?5. Neuropsych: This patient is capable of making decisions on his own behalf. ?6. Skin/Wound Care: Routine pressure relief measures.  ?7. Fluids/Electrolytes/Nutrition: Monitor I/O. Check CMET in am.  ?8. HTN: goal to keep SBP< 160 ? -3/5 bp remains poorly controlled ?            -- Catapress 2 mg TID, metoprolol increased to 75 mg BID and Norvasc 10 qd. ?  -will begin scheduled hydralazine 50mg  q8 ? -will use addnl hydralazine prn  ? 3/6- just added hydralazine- BP 165/98 this AM- will monitor and follow trend  ?3/9- BP  150s/99- will increase hydralazine to 100 mg TID ? 3/10- just increased-hydralazine-  BP 150s/90s- wait to let it kick in ?3/15- BP slightly elevated, but doing much better- con't regimen ? 3/16- BP running 140s/80s-  pulse in 60s so cannot increase B blocker-  ?3/29- BP 924Q systolic- on norvasc max dose and Clonidine 0.2 mg TID- and Hydralazine 100 mg TID- and Metoprolol 75 mg BID- but cannot increase due to borderline bradycardia. Will con't to monitor ?3/19- BP 152/95 this AM but 133/85 last night- change Norvasc to bedtime to try and help with AM increase in BP.  ?Vitals:  ? 05/06/21 2039 05/07/21 0555  ?BP: (!) 159/98 (!) 148/81  ?Pulse: 65 71  ?Resp: 18 17  ?Temp: 98.4 ?F (36.9 ?C) 98.5 ?F (36.9 ?C)  ?SpO2: 99% 98%  ?Still elevated in am , Norvasc just changed yesterday , consider alpha blocker at night if am systolic elevation persists  ?3/21- change clonidine to 0.3 mg patch q 7 days and stop oral clonidine.  ? 3/22- educated about clonidine patch and rebound HTN which he's at risk for- so reduce chance of another CVA.  ?9. Persistent hypokalemia: Start Kdur 20 meq daily. Check Mg level ? 3/3- Will replete 40 mEq x2 and recheck Monday- last Mg on 2/28 was 2.3 ? 3/6- K+ up to 3.6- con't to monitor ? 3/8- labs in AM ? 3/9- Mg 1.8 and K+ 3.5- will con't to monitor ? 3/12- labs Monday  ? 3/14- K+ 3.7- con't to monitor ? 3/16- K+ 3.5- still in normal range, although low- will ask pt to eat bananas, etc ?10.  Severe LVH: Has been referred to cardiology for work up. Continue metoprolol bid.  ?11. 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  ?--Renal ultrasound with mild evidence of medical renal disease ?            --encourage fluid intake. ?3/3- down slightly to 2.21- will avoid nephrotoxic agents ?3/9- Renal U/S normal- Cr up slightly to 1.99 and BUN 25- think it's leveling out- will con't to monitor ?3/13- Cr up slightly more to 2.08- and BUN 23- will recheck in Am and  push fluids- also, might have infection/UTI which could affect Cr slightly? ? 3/14- U/A (-) and Cr up again to 2.12 and BUN 23- has (+) tenting- asked pt to drink 6-8 cups/day and will recheck Thursday ?3/16- Cr up to 1.95 a

## 2021-05-07 NOTE — Progress Notes (Signed)
Physical Therapy Session Note ? ?Patient Details  ?Name: Miguel Mccullough ?MRN: [id number] ?Date of Birth: 11-Aug-1965 ? ?Today's Date: 05/07/2021 ?PT Individual Time: 5427-0623 ?PT Individual Time Calculation (min): 86 min  ? ?Short Term Goals: ?Week 1:  PT Short Term Goal 1 (Week 1): Pt will perform bed to wc transfers to R w/min assist and light cues, to L w/mod to max assist of 1 ?PT Short Term Goal 1 - Progress (Week 1): Met ?PT Short Term Goal 2 (Week 1): Pt will sit statically w/feet supported w/cga ?PT Short Term Goal 2 - Progress (Week 1): Met ?PT Short Term Goal 3 (Week 1): Pt will ambulate up to 37f w/LRAD and 2 person assist ?PT Short Term Goal 3 - Progress (Week 1): Met ?PT Short Term Goal 4 (Week 1): Pt will propel wc 210fw/R exts and moderate cueing to attend to L environment. ?PT Short Term Goal 4 - Progress (Week 1): Met ?Week 2:  PT Short Term Goal 1 (Week 2): Pt will self propel w/c with hemi technique with supervision and no cues ?PT Short Term Goal 1 - Progress (Week 2): Met ?PT Short Term Goal 2 (Week 2): Pt will initiate standing transfer training ?PT Short Term Goal 2 - Progress (Week 2): Progressing toward goal ?PT Short Term Goal 3 (Week 2): Pt will complete bed mobility with supervision ?PT Short Term Goal 3 - Progress (Week 2): Met ? ?Skilled Therapeutic Interventions/Progress Updates:  ?  Pt seated in w/c on arrival and agreeable to therapy. No complaint of pain.  ?Propelled w/c with hemi technique to 4th floor hall way for gait training. Pt's wife present to assist with gait and for family education.  ?Gait training 4 x 55 ft for total of 220 ft (double previous day!) with RW. Mod-max A to advance LLE, needing only min A knee block throughout. Pt with min LOB at times, improved with multimodal cues for upright posture, RW proximity, increased step length, and facilitation for weight shift. ?Transitioned to car transfer to problem solve with pt's vehicles in mind. Performed modified squat  pivot with use of overhead grab bar with min A for transfer and LE management. Attempted stand step transfer with RW with max A to move LLE.  ?Pt performed 2 x 8 side steps both directions with seated rest breaks, max A to advance LLE fading to tot A with fatigue. Pt transported back to room d/t fatigue and then performed CGA squat pivot to recliner, was left with all needs in reach and alarm active.  ? ? ?Therapy Documentation ?Precautions:  ?Precautions ?Precautions: Fall ?Precaution Comments: Flaccid Lt hemi, hx scoliosis, HTN ?Restrictions ?Weight Bearing Restrictions: No ?General: ? ? ? ?Therapy/Group: Individual Therapy ? ?[place]3/22/2023, 2:30 PM  ?

## 2021-05-07 NOTE — Progress Notes (Signed)
Patient ID: Miguel Mccullough, male   DOB: 27-Jan-1966, 56 y.o.   MRN: [id number]  Met with pt and wife who is present in his room to update them regarding team conference yesterday and the progress he is making. Both are pleased with how well he is doing and continue to be hopeful he will continue to progress while here and even after discharge. Aware still working toward discharge of 3/30. Will begin equipment search and wife is aware of the list of equipment husband will need at home. Wife is here daily and participating in his therapies. Work toward discharge next Thursday  ?

## 2021-05-07 NOTE — Progress Notes (Signed)
Speech Language Pathology Daily Session Note ? ?Patient Details  ?Name: Miguel Mccullough ?MRN: [id number] ?Date of Birth: July 18, 1965 ? ?Today's Date: 05/07/2021 ?SLP Individual Time: 1638-4665 ?SLP Individual Time Calculation (min): 57 min ? ?Short Term Goals: ?Week 3: SLP Short Term Goal 1 (Week 3): Pt will complete complex problem solving tasks with Supervision A verbal and visual cues ?SLP Short Term Goal 2 (Week 3): Pt will complete higher level cognitive tasks (med management, money management, etc) with min A cues ?SLP Short Term Goal 3 (Week 3): Pt will complete moderately complex alternating attention tasks with min A cues ?SLP Short Term Goal 4 (Week 3): Patient will implement speech intelligibility strategies at the sentence level with Mod I verbal cues when fatigued ?SLP Short Term Goal 5 (Week 3): Pt will utilize compensatory memory strategies as trained to recall specific, functional information with Supervision A cues ? ?Skilled Therapeutic Interventions:Skilled ST services focused on education and cognitive skills. SLP facilitated executive function skills in familiar but novel deductive reasoning puzzles. Pt was greatly impacted by attention when in a distracting environment, NT changing bedding, requiring max A fading to mod A verbal cues to problem solve during this time. However, when distractions were eliminated, pt required only min A fading to supervision A verbal cues. Pt demonstrated good carryover of note taking skills from yesterday's session, which aided in working memory and organization. Pt required continued encouragement to execute "think aloud" strategy to aid in prioritization and working memory. SLP provided education to pt and pt's wife pertaining to sourcing executive function/deductive reasoning tasks online, cuing pt for tasks such as "thinking aloud", note taking and stating next steps, as well as allowing pt more processing time. All questions were answered to satisfaction, education  is ongoing.Pt was left in room with call bell within reach and bed/chair alarm set. SLP recommends to continue skilled services. ?   ? ?Pain ?Pain Assessment ?Pain Score: 0-No pain ? ?Therapy/Group: Individual Therapy ? ?Miguel Mccullough ?05/07/2021, 9:32 AM ?

## 2021-05-08 NOTE — Progress Notes (Signed)
Physical Therapy Session Note ? ?Patient Details  ?Name: Miguel Mccullough ?MRN: [id number] ?Date of Birth: 1965/08/10 ? ?Today's Date: 05/08/2021 ?PT Individual Time: 1130-1200 and 1405-1530 ?PT Individual Time Calculation (min): 30 min and 70mn ? ?Short Term Goals: ?Week 1:  PT Short Term Goal 1 (Week 1): Pt will perform bed to wc transfers to R w/min assist and light cues, to L w/mod to max assist of 1 ?PT Short Term Goal 1 - Progress (Week 1): Met ?PT Short Term Goal 2 (Week 1): Pt will sit statically w/feet supported w/cga ?PT Short Term Goal 2 - Progress (Week 1): Met ?PT Short Term Goal 3 (Week 1): Pt will ambulate up to 147fw/LRAD and 2 person assist ?PT Short Term Goal 3 - Progress (Week 1): Met ?PT Short Term Goal 4 (Week 1): Pt will propel wc 2532f/R exts and moderate cueing to attend to L environment. ?PT Short Term Goal 4 - Progress (Week 1): Met ?Week 2:  PT Short Term Goal 1 (Week 2): Pt will self propel w/c with hemi technique with supervision and no cues ?PT Short Term Goal 1 - Progress (Week 2): Met ?PT Short Term Goal 2 (Week 2): Pt will initiate standing transfer training ?PT Short Term Goal 2 - Progress (Week 2): Progressing toward goal ?PT Short Term Goal 3 (Week 2): Pt will complete bed mobility with supervision ?PT Short Term Goal 3 - Progress (Week 2): Met ?Week 3:  PT Short Term Goal 1 (Week 3): Pt will ambulate with LRAD with mod A or better x 50 ft ?PT Short Term Goal 2 (Week 3): Pt will perform squat pivot transfer with supervision ?PT Short Term Goal 3 (Week 3): Pt will initiate standing transfer with LRAD ? ?Skilled Therapeutic Interventions/Progress Updates:  ?  AM SESSION - denies pain ? ?Pt oob in wc and awaiting PT session.   ?Wc propulsion x [id number], attends to l environment when navigating out of room without cueing. ? ?Wc to mat squat pivot w/min to mod assist to R side, cues for set up of Les ? ?Sit to stand repeated from mat w/cues for midline orientation to promote  wbing/use of LLE w/transition, mod assist. ? ?Standing - worked on quad acitvation in standing w/tactile/verbal cues , much difficulty isolating without retracting L hip/rotating trunk L. ? ?Worked on single step forward/back w/LLE/pregait.activity. ? ?Sit to stand + sidestep x 1 + stand to sit, then repeated in opposite direction total of 5 rounds to promote wt shifting, wbing thru LLE, hip activation.   ? ?stand pivot transfer to wc to R side w/min assist overall, cues for sequencing, posture, attendtion to L. ? ?Pt left oob in wc w/alarm belt set and needs in reach ? ? ?PM SESSION ?Pt transported to 4th dayroom for session w/focus on Litegait training.  Daughter Trin[employer] traderteering Litegait, retrieval of wc when needed, etc throughout session. ? ?Harness donned by therapist in sitting/standing.  LUE acewrapped to secure to handle.  LLE DF acewrap assist applied by therapist. ? ?In LiteBethunenimal bodywt support provided. ?Gait trials as follows: ?Trial 1:  60ft31ft able to inititate swing L but heavy ER and inconsistent step length, mod assist for wt shifting, assist 75% to complete step, downward gaze, poor advancement of L hip from initial contact to terminal stance, retraction of L hip and shoulder, step to gait, pt w/R glut fatigue limiting gait. ? ?Therapist demonstrated above deviations and demonstrated hip advancement and  upright posture during stance, pt instructed to focus on this. ? ?Trial 2:  78f w/therapist advancing/placing LLE and pt focusing on R stance mechanics w/improvement in hip advancement/posture demonstrated w/heavy verbal, visual, and tactile cueing. ? ?Trial 3:  1075fw/therapist advancing/placing LLE thru swing, pt focusing on R stance mechanics w/significantly improved mechanics/improved posture/pt advancing L hip over limb thru midstance/terminal stance when cued w/step thru gait. ?Therapist occasionally instructing pt to advance LLE himself, mod assist due to  fatigue, pt w/difficulty focusing on this and above stance mechanics resulting in reverting to original stance deviations.  ? ? ?Standing w/foot on towel to decrease friction and handrail for balance support, worked on IR of L hip w/max verbal cues/encouragement to maximize AROM.  Repeated x 8 reps. ? ?stand pivot transfer wc to recliner w/max cues for upright posture/mechanics, min assist. ?Pt left oob in recliner w/family supervising pt/decline use of alarm belt and needs in reach. ? ? ? ? ?Therapy Documentation ?Precautions:  ?Precautions ?Precautions: Fall ?Precaution Comments: Flaccid Lt hemi, hx scoliosis, HTN ?Restrictions ?Weight Bearing Restrictions: No ? ? ? ? ?Therapy/Group: Individual Therapy ?BaCallie FieldingPT ? ? ?BaJerrilyn [place]3/23/2023, 12:05 PM  ?

## 2021-05-08 NOTE — Progress Notes (Signed)
?                                                       PROGRESS NOTE ? ? ?Subjective/Complaints: ? ?Feeling good today. No pain- did ROM HEP last night and for leg this AM. Spasticity stable.  ? ?Has new PCP- appt- 4/4/  ? ?ROS:  ? ?Pt denies SOB, abd pain, CP, N/V/C/D, and vision changes ? ? ? ? ? ? ? ? ? ?Objective: ?  ?No results found. ?No results for input(s): WBC, HGB, HCT, PLT in the last 72 hours. ? ? ? ?No results for input(s): NA, K, CL, CO2, GLUCOSE, BUN, CREATININE, CALCIUM in the last 72 hours. ? ? ? ? ?Intake/Output Summary (Last 24 hours) at 05/08/2021 0915 ?Last data filed at 05/08/2021 0700 ?Gross per 24 hour  ?Intake 658 ml  ?Output --  ?Net 658 ml  ?  ? ?  ? ?Physical Exam: ?Vital Signs ?Blood pressure 139/85, pulse 62, temperature 98.8 ?F (37.1 ?C), resp. rate 18, height 5\' 4"  (1.626 m), weight 75.9 kg, SpO2 97 %. ? ? ? ? ?General: awake, alert, appropriate, sitting up in bedside chair; wife at bedside; NAD ?HENT: conjugate gaze; oropharynx moist ?CV: regular rate; no JVD ?Pulmonary: CTA B/L; no W/R/R- good air movement ?GI: soft, NT, ND, (+)BS ?Psychiatric: appropriate ?Neurological: Ox3 ?MAS of 2 in L shoulder still but better in L elbow MAS of 1  ?Ext: no clubbing, cyanosis, 1-2+ edema of LLE -ankle/foot ?Musculoskeletal:  ?   Cervical back: Neck supple. No tenderness.  ? 0/5 on L side still ?Skin: ?   General: Skin is warm and dry.  ?Neurological:  ?   Mental Status: He is alert and oriented to person, place, and time.  ?   Comments: Mild left facial weakness with left inattention and mild left hemifacial sensory loss.  Speech clear and able to follow commands without difficulty. Dense left hemiplegia with sensory deficits. LUE /LLE 0/5 except for trace at foot. RUE and RLE 5/5. No motor/sensory changes today ?  ?  ? ?Assessment/Plan: ?1. Functional deficits which require 3+ hours per day of interdisciplinary therapy in a comprehensive inpatient rehab setting. ?Physiatrist is providing close  team supervision and 24 hour management of active medical problems listed below. ?Physiatrist and rehab team continue to assess barriers to discharge/monitor patient progress toward functional and medical goals ? ?Care Tool: ? ?Bathing ?   ?Body parts bathed by patient: Chest, Abdomen, Front perineal area, Right upper leg, Left upper leg, Face, Left arm, Right lower leg, Left lower leg  ? Body parts bathed by helper: Buttocks, Right arm, Right lower leg, Left lower leg ?  ?  ?Bathing assist Assist Level: Moderate Assistance - Patient 50 - 74% (Per family report; wife prefers to assist patient with bathing) ?  ?  ?Upper Body Dressing/Undressing ?Upper body dressing   ?What is the patient wearing?: Pull over shirt ?   ?Upper body assist Assist Level: Minimal Assistance - Patient > 75% ?   ?Lower Body Dressing/Undressing ?Lower body dressing ? ? ?   ?What is the patient wearing?: Pants, Underwear/pull up ? ?  ? ?Lower body assist Assist for lower body dressing: Moderate Assistance - Patient 50 - 74% ?   ? ?Toileting ?Toileting Toileting Activity did not occur Arboriculturist  management and hygiene only): N/A (no void or bm)  ?Toileting assist Assist for toileting: Moderate Assistance - Patient 50 - 74% ?  ?  ?Transfers ?Chair/bed transfer ? ?Transfers assist ?   ? ?Chair/bed transfer assist level: Minimal Assistance - Patient > 75% ?  ?  ?Locomotion ?Ambulation ? ? ?Ambulation assist ? ?   ? ?Assist level: Minimal Assistance - Patient > 75% ?Assistive device: Other (comment) (hall rail) ?Max distance: 30  ? ?Walk 10 feet activity ? ? ?Assist ? Walk 10 feet activity did not occur: Safety/medical concerns ? ?Assist level: Minimal Assistance - Patient > 75% ?Assistive device: Other (comment) (hall rail)  ? ?Walk 50 feet activity ? ? ?Assist Walk 50 feet with 2 turns activity did not occur: Safety/medical concerns ? ?Assist level: 2 helpers ?Assistive device: Lite Gait  ? ? ?Walk 150 feet activity ? ? ?Assist Walk 150 feet  activity did not occur: Safety/medical concerns ? ?Assist level: 2 helpers ?Assistive device: Lite Gait ?  ? ?Walk 10 feet on uneven surface  ?activity ? ? ?Assist Walk 10 feet on uneven surfaces activity did not occur: Safety/medical concerns ? ? ?  ?   ? ?Wheelchair ? ? ? ? ?Assist Is the patient using a wheelchair?: Yes ?Type of Wheelchair: Manual ?  ? ?Wheelchair assist level: Supervision/Verbal cueing ?Max wheelchair distance: 90  ? ? ?Wheelchair 50 feet with 2 turns activity ? ? ? ?Assist ? ?  ?  ? ? ?Assist Level: Supervision/Verbal cueing  ? ?Wheelchair 150 feet activity  ? ? ? ?Assist ?   ? ? ?Assist Level: Supervision/Verbal cueing  ? ?Blood pressure 139/85, pulse 62, temperature 98.8 ?F (37.1 ?C), resp. rate 18, height 5\' 4"  (1.626 m), weight 75.9 kg, SpO2 97 %. ? ?Medical Problem List and Plan: ?1. 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            -patient may  shower ?            -ELOS/Goals: 14-20 days- min A to supervision ? D/c date 3/30 ? Con't CIR- PT, OT and SLP ?2.  Antithrombotics: ?-DVT/anticoagulation:  lovenox ? -dopplers reviewed and negative ?            -antiplatelet therapy: N/A due to bleed.  ?3. Pain Management:: On tylenol as needed ?4. Mood: LCSW to follow for evaluation an dsup ?            -antipsychotic agents: N/A ?5. Neuropsych: This patient is capable of making decisions on his own behalf. ?6. Skin/Wound Care: Routine pressure relief measures.  ?7. Fluids/Electrolytes/Nutrition: Monitor I/O. Check CMET in am.  ?8. HTN: goal to keep SBP< 160 ? -3/5 bp remains poorly controlled ?            -- Catapress 2 mg TID, metoprolol increased to 75 mg BID and Norvasc 10 qd. ?  -will begin scheduled hydralazine 50mg  q8 ? -will use addnl hydralazine prn  ? 3/6- just added hydralazine- BP 165/98 this AM- will monitor and follow trend  ?3/9- BP 150s/99- will increase hydralazine to 100 mg TID ? 3/10- just increased-hydralazine-  BP 150s/90s- wait  to let it kick in ?3/15- BP slightly elevated, but doing much better- con't regimen ? 3/16- BP running 140s/80s- pulse in 60s so cannot increase B blocker-  ?2/37- BP 628B systolic- on norvasc max dose and Clonidine 0.2 mg TID- and Hydralazine 100 mg TID- and  Metoprolol 75 mg BID- but cannot increase due to borderline bradycardia. Will con't to monitor ?3/19- BP 152/95 this AM but 133/85 last night- change Norvasc to bedtime to try and help with AM increase in BP.  ?Vitals:  ? 05/07/21 2000 05/08/21 8921  ?BP: (!) 155/77 139/85  ?Pulse: 63 62  ?Resp: 18 18  ?Temp: 99 ?F (37.2 ?C) 98.8 ?F (37.1 ?C)  ?SpO2: 100% 97%  ?Still elevated in am , Norvasc just changed yesterday , consider alpha blocker at night if am systolic elevation persists  ?3/21- change clonidine to 0.3 mg patch q 7 days and stop oral clonidine.  ?3/23- BP little better- Con't regimen ?9. Persistent hypokalemia: Start Kdur 20 meq daily. Check Mg level ? 3/3- Will replete 40 mEq x2 and recheck Monday- last Mg on 2/28 was 2.3 ? 3/6- K+ up to 3.6- con't to monitor ? 3/8- labs in AM ? 3/9- Mg 1.8 and K+ 3.5- will con't to monitor ? 3/12- labs Monday  ? 3/14- K+ 3.7- con't to monitor ? 3/16- K+ 3.5- still in normal range, although low- will ask pt to eat bananas, etc ?10.  Severe LVH: Has been referred to cardiology for work up. Continue metoprolol bid.  ?11. 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  ?--Renal ultrasound with mild evidence of medical renal disease ?            --encourage fluid intake. ?3/3- down slightly to 2.21- will avoid nephrotoxic agents ?3/9- Renal U/S normal- Cr up slightly to 1.99 and BUN 25- think it's leveling out- will con't to monitor ?3/13- Cr up slightly more to 2.08- and BUN 23- will recheck in Am and push fluids- also, might have infection/UTI which could affect Cr slightly? ? 3/14- U/A (-) and Cr up again to 2.12 and BUN 23- has (+) tenting- asked pt to drink 6-8 cups/day and will  recheck Thursday ?3/16- Cr up to 1.95 and BUN 21- doing much better when drinks- con't regimen ?  ? ?  Latest Ref Rng & Units 05/05/2021  ?  8:22 AM 05/01/2021  ?  5:24 AM 04/29/2021  ?  5:37 AM  ?BMP  ?Gluc

## 2021-05-08 NOTE — Progress Notes (Signed)
Occupational Therapy Session Note ? ?Patient Details  ?Name: Miguel Mccullough ?MRN: [id number] ?Date of Birth: [birth year] ? ?Today's Date: 05/08/2021 ?OT Individual Time: 6073-7106 ?OT Individual Time Calculation (min): 59 min  ? ? ?Short Term Goals: ?Week 1:  OT Short Term Goal 1 (Week 1): Pt will thread Lt UE into shirt with Mod A and vcs to improve Lt attention ?OT Short Term Goal 1 - Progress (Week 1): Progressing toward goal ?OT Short Term Goal 2 (Week 1): Pt will verbalize 2 joint protection strategies to use during bed mobility and/or transfers to improve Lt attention ?OT Short Term Goal 2 - Progress (Week 1): Progressing toward goal ?OT Short Term Goal 3 (Week 1): Education will be provided to pts wife in regards to 2 hemi positioning strategies to implement at home for caregiver education ?OT Short Term Goal 3 - Progress (Week 1): Met ?Week 2:  OT Short Term Goal 1 (Week 2): Pt will verbalize 2 joint protection strategies to use during bed mobility and/or transfers to improve Lt attention ?OT Short Term Goal 1 - Progress (Week 2): Met ?OT Short Term Goal 2 (Week 2): Pt will thread Lt UE into shirt with Mod A and vcs to improve Lt attention ?OT Short Term Goal 2 - Progress (Week 2): Met ?OT Short Term Goal 3 (Week 2): Pt will perform AAROM/PROM HEP for LUE with no more than MIN verbal cues ?OT Short Term Goal 3 - Progress (Week 2): Met ?OT Short Term Goal 4 (Week 2): Pt will perform sit <> stand with LRAD with Mod A of 1 in prep for LB ADL ?OT Short Term Goal 4 - Progress (Week 2): Met ?Week 3:  OT Short Term Goal 2 (Week 3): STG=LTG 2/2 ELOS ? ?Skilled Therapeutic Interventions/Progress Updates:  ?Patient met seated in agreement with OT treatment session. 0/10 pain reported at rest and with activity. Patient able to self-propel wc to rehab gym on 4th floor. Improved attention to L this date. Squat-pivot to mat table on R with CGA. NMR at shoulder then triceps to improve functional transfers with RW. Please refer  below for additional details. With e-stim on LUE, patient completed blocked practice for sit to stands and lateral steps with RW and L walker splint and L leg loop in prep for toilet transfers. Returned to room in same manner as indicated above. Session concluded with patient seated in wc with call bell within reach and all needs met. Wife assisting with transfer to recliner.  ? ?1:1 NMES applied to supraspinatus and middle deltoid to help approximate shoulder joint to reduce risk of subluxation. NMES then applied to triceps to improve functional transfers and self-care tasks. No s/s skin breakdown and no pain reported during or after e-stim.  ? ?Ratio 1:3 ?Rate 35 pps ?Waveform- Asymmetric ?Ramp 1.0 ?Pulse 300 ?Intensity- 35-41 ?Duration -   10 min ? ? ?Therapy Documentation ?Precautions:  ?Precautions ?Precautions: Fall ?Precaution Comments: Flaccid Lt hemi, hx scoliosis, HTN ?Restrictions ?Weight Bearing Restrictions: No ?General: ?  ?Therapy/Group: Individual Therapy ? ?Jayesh Marbach R [place] ?05/08/2021, 10:00 AM ?

## 2021-05-09 MED ORDER — BACLOFEN 10 MG PO TABS
10.0000 mg | ORAL_TABLET | Freq: Three times a day (TID) | ORAL | Status: DC
  Administered 2021-05-09 – 2021-05-15 (×18): 10 mg via ORAL
  Filled 2021-05-09 (×18): qty 1

## 2021-05-09 NOTE — Progress Notes (Signed)
Physical Therapy Weekly Progress Note ? ?Patient Details  ?Name: JDEN WANT ?MRN: [id number] ?Date of Birth: [birth year] ? ?Beginning of progress report period: May 02, 2021 ?End of progress report period: May 09, 2021 ? ?Today's Date: 05/09/2021 ?PT Individual Time: 1015-1100 ?PT Individual Time Calculation (min): 45 min  ? ?Patient has met 3 of 3 short term goals.  Pt able to transfer with CGA-supervision. Supervision-mod I w/c mobility. Gait with RW with mod A for LLE advancement up to 50 ft.  ? ?Patient continues to demonstrate the following deficits muscle weakness, impaired timing and sequencing and unbalanced muscle activation, and decreased standing balance, decreased postural control, and hemiplegia and therefore will continue to benefit from skilled PT intervention to increase functional independence with mobility. ? ?Patient progressing toward long term goals..  Continue plan of care. ? ?PT Short Term Goals ?Week 3:  PT Short Term Goal 1 (Week 3): Pt will ambulate with LRAD with mod A or better x 50 ft ?PT Short Term Goal 1 - Progress (Week 3): Met ?PT Short Term Goal 2 (Week 3): Pt will perform squat pivot transfer with supervision ?PT Short Term Goal 2 - Progress (Week 3): Met ?PT Short Term Goal 3 (Week 3): Pt will initiate standing transfer with LRAD ?PT Short Term Goal 3 - Progress (Week 3): Met ?Week 4:  PT Short Term Goal 1 (Week 4): =LTGs d/t ELOS ? ?Skilled Therapeutic Interventions/Progress Updates:  ?Pt seated in w/c on arrival and agreeable to therapy. Pt transported to therapy gym for time management and energy conservation. Pt instructed on and performed stair navigation x 4 with shower chair technique. Pt required assist to lift LLE onto stair and CGA for standing to R rail. Pt feels comfortable with this, but would like to practice with his wife. Pt then performed side steps at mat table 2 x 8 both directions with seated rest breaks. Pt required min-mod A for LLE advancement, CGA for  knee block! Noted active lateral motion both directions with assist to minimize friction/gravity. Pt transported back to room, ambulated x 4 ft, including turn to sit in recliner, with +1 assist, RW and ace wrap. Mod A overall, min A for LLE advancement when stepping forward. Pt then set up in recliner and was left with all needs in reach and alarm active.  ? ?Therapy Documentation ?Precautions:  ?Precautions ?Precautions: Fall ?Precaution Comments: Flaccid Lt hemi, hx scoliosis, HTN ?Restrictions ?Weight Bearing Restrictions: No ?General: ?  ? ? ? ?Therapy/Group: Individual Therapy ? ?[place] ?05/09/2021, 12:52 PM  ?

## 2021-05-09 NOTE — Progress Notes (Addendum)
Speech Language Pathology Weekly Progress and Session Note ? ?Patient Details  ?Name: OSBORN PULLIN ?MRN: [id number] ?Date of Birth: [birth year] ? ?Beginning of progress report period: May 02, 2021 ?End of progress report period: May 09, 2021 ? ?Today's Date: 05/09/2021 ?SLP Individual Time: 8638-1771 ?SLP Individual Time Calculation (min): 55 min ? ?Short Term Goals: ?Week 3: SLP Short Term Goal 1 (Week 3): Pt will complete complex problem solving tasks with Supervision A verbal and visual cues ?SLP Short Term Goal 1 - Progress (Week 3): Met ?SLP Short Term Goal 2 (Week 3): Pt will complete higher level cognitive tasks (med management, money management, etc) with min A cues ?SLP Short Term Goal 2 - Progress (Week 3): Met ?SLP Short Term Goal 3 (Week 3): Pt will complete moderately complex alternating attention tasks with min A cues ?SLP Short Term Goal 3 - Progress (Week 3): Met ?SLP Short Term Goal 4 (Week 3): Patient will implement speech intelligibility strategies at the sentence level with Mod I verbal cues when fatigued ?SLP Short Term Goal 4 - Progress (Week 3): Met ?SLP Short Term Goal 5 (Week 3): Pt will utilize compensatory memory strategies as trained to recall specific, functional information with Supervision A cues ?SLP Short Term Goal 5 - Progress (Week 3): Met ? ?  ?New Short Term Goals: ?Week 4: SLP Short Term Goal 1 (Week 4): STG = LTG d/t ELOS ? ?Weekly Progress Updates: ?Pt has made functional gains this reporting period and met 5 out of 5 short term goals. Pt demonstrates improvement in alternating attention during functional tasks and ability to recall functional information. Pt continues to benefit from verbal cues for complex problem solving and executive function tasks, ongoing recommendations for supervision at home. Pt is demonstrating increased awareness of current physical and cognitive deficits however education and training in ongoing for how these impairments will impact daily living  tasks at discharge. Wife has been present for many ST tx sessions for education, is able to provide cognitive and physical assist as needed.  ? ?Intensity: Minumum of 1-2 x/day, 30 to 90 minutes ?Frequency: 3 to 5 out of 7 days ?Duration/Length of Stay: 3/30 ?Treatment/Interventions: Patient/family education;Dysphagia/aspiration precaution training;Cognitive remediation/compensation;Speech/Language facilitation;Functional tasks;Therapeutic Activities ? ? ?Daily Session ? ?Skilled Therapeutic Interventions: Pt seen for skilled ST with focus on cognitive goals, pt upright in recliner with wife initially present. SLP facilitating executive function task by providing overall Supervision level verbal cues, pt able to alternate attention between 2 worksheet concepts and have simple conversation with therapist effectively. Pt completing moderately complex problem solving/following direction task requiring min A cues for 80% accuracy. Pt continues to benefit from cues for error awareness during structured tasks as well as verbal cues to "think aloud".  Pt left in recliner with alarm set and all needs within reach, cont ST POC.    ? ?General  ?  ?Pain ?Pain Assessment ?Pain Scale: 0-10 ?Pain Score: 0-No pain ? ?Therapy/Group: Individual Therapy ? ?Dewaine Conger ?05/09/2021, 1:59 PM ? ? ? ? ? ? ?

## 2021-05-09 NOTE — Progress Notes (Signed)
?                                                       PROGRESS NOTE ? ? ?Subjective/Complaints: ? ?Pt reports feeling good this Am- did ROM of LUE/LLE this Am already.  ? ? ?ROS:  ? ?Pt denies SOB, abd pain, CP, N/V/C/D, and vision changes ? ? ? ? ? ? ? ? ?Objective: ?  ?No results found. ?No results for input(s): WBC, HGB, HCT, PLT in the last 72 hours. ? ? ? ?No results for input(s): NA, K, CL, CO2, GLUCOSE, BUN, CREATININE, CALCIUM in the last 72 hours. ? ? ? ? ?Intake/Output Summary (Last 24 hours) at 05/09/2021 0911 ?Last data filed at 05/09/2021 0700 ?Gross per 24 hour  ?Intake 718 ml  ?Output --  ?Net 718 ml  ?  ? ?  ? ?Physical Exam: ?Vital Signs ?Blood pressure (!) 144/84, pulse 64, temperature 98.1 ?F (36.7 ?C), temperature source Oral, resp. rate 18, height 5\' 4"  (1.626 m), weight 76.2 kg, SpO2 99 %. ? ? ? ? ? ?General: awake, alert, appropriate, sitting up in bedside chair; wife at bedside  NAD ?HENT: conjugate gaze; oropharynx moist- R gaze preference almost resolved ?CV: regular rate; no JVD ?Pulmonary: CTA B/L; no W/R/R- good air movement ?GI: soft, NT, ND, (+)BS ?Psychiatric: appropriate ?Neurological: Ox3 ?MAS of 2 in L shoulder almost 3 even after ROM this AM- MAS of 1+ to 2 in L elbow  ?Ext: no clubbing, cyanosis, 1-2+ edema of LLE -ankle/foot ?Musculoskeletal:  ?   Cervical back: Neck supple. No tenderness.  ? 0/5 on L side still ?Skin: ?   General: Skin is warm and dry.  ?Neurological:  ?   Mental Status: He is alert and oriented to person, place, and time.  ?   Comments: Mild left facial weakness with left inattention and mild left hemifacial sensory loss.  Speech clear and able to follow commands without difficulty. Dense left hemiplegia with sensory deficits. LUE /LLE 0/5 except for trace at foot. RUE and RLE 5/5. No motor/sensory changes today ?  ?  ? ?Assessment/Plan: ?1. Functional deficits which require 3+ hours per day of interdisciplinary therapy in a comprehensive inpatient rehab  setting. ?Physiatrist is providing close team supervision and 24 hour management of active medical problems listed below. ?Physiatrist and rehab team continue to assess barriers to discharge/monitor patient progress toward functional and medical goals ? ?Care Tool: ? ?Bathing ?   ?Body parts bathed by patient: Chest, Abdomen, Front perineal area, Right upper leg, Left upper leg, Face, Left arm, Right lower leg, Left lower leg  ? Body parts bathed by helper: Buttocks, Right arm, Right lower leg, Left lower leg ?  ?  ?Bathing assist Assist Level: Moderate Assistance - Patient 50 - 74% (Per family report; wife prefers to assist patient with bathing) ?  ?  ?Upper Body Dressing/Undressing ?Upper body dressing   ?What is the patient wearing?: Pull over shirt ?   ?Upper body assist Assist Level: Minimal Assistance - Patient > 75% ?   ?Lower Body Dressing/Undressing ?Lower body dressing ? ? ?   ?What is the patient wearing?: Pants, Underwear/pull up ? ?  ? ?Lower body assist Assist for lower body dressing: Moderate Assistance - Patient 50 - 74% ?   ? ?Toileting ?Toileting  Toileting Activity did not occur (Clothing management and hygiene only): N/A (no void or bm)  ?Toileting assist Assist for toileting: Moderate Assistance - Patient 50 - 74% ?  ?  ?Transfers ?Chair/bed transfer ? ?Transfers assist ?   ? ?Chair/bed transfer assist level: Minimal Assistance - Patient > 75% ?  ?  ?Locomotion ?Ambulation ? ? ?Ambulation assist ? ?   ? ?Assist level: Minimal Assistance - Patient > 75% ?Assistive device: Other (comment) (hall rail) ?Max distance: 30  ? ?Walk 10 feet activity ? ? ?Assist ? Walk 10 feet activity did not occur: Safety/medical concerns ? ?Assist level: Minimal Assistance - Patient > 75% ?Assistive device: Other (comment) (hall rail)  ? ?Walk 50 feet activity ? ? ?Assist Walk 50 feet with 2 turns activity did not occur: Safety/medical concerns ? ?Assist level: 2 helpers ?Assistive device: Lite Gait  ? ? ?Walk 150 feet  activity ? ? ?Assist Walk 150 feet activity did not occur: Safety/medical concerns ? ?Assist level: 2 helpers ?Assistive device: Lite Gait ?  ? ?Walk 10 feet on uneven surface  ?activity ? ? ?Assist Walk 10 feet on uneven surfaces activity did not occur: Safety/medical concerns ? ? ?  ?   ? ?Wheelchair ? ? ? ? ?Assist Is the patient using a wheelchair?: Yes ?Type of Wheelchair: Manual ?  ? ?Wheelchair assist level: Supervision/Verbal cueing ?Max wheelchair distance: 90  ? ? ?Wheelchair 50 feet with 2 turns activity ? ? ? ?Assist ? ?  ?  ? ? ?Assist Level: Supervision/Verbal cueing  ? ?Wheelchair 150 feet activity  ? ? ? ?Assist ?   ? ? ?Assist Level: Supervision/Verbal cueing  ? ?Blood pressure (!) 144/84, pulse 64, temperature 98.1 ?F (36.7 ?C), temperature source Oral, resp. rate 18, height 5\' 4"  (1.626 m), weight 76.2 kg, SpO2 99 %. ? ?Medical Problem List and Plan: ?1. 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            -patient may  shower ?            -ELOS/Goals: 14-20 days- min A to supervision ? D/c date 3/30 ? Continue CIR- PT, OT and SLP- doing ROM but spasticity still an issue ?2.  Antithrombotics: ?-DVT/anticoagulation:  lovenox ? -dopplers reviewed and negative ?            -antiplatelet therapy: N/A due to bleed.  ?3. Pain Management:: On tylenol as needed ?4. Mood: LCSW to follow for evaluation an dsup ?            -antipsychotic agents: N/A ?5. Neuropsych: This patient is capable of making decisions on his own behalf. ?6. Skin/Wound Care: Routine pressure relief measures.  ?7. Fluids/Electrolytes/Nutrition: Monitor I/O. Check CMET in am.  ?8. HTN: goal to keep SBP< 160 ? -3/5 bp remains poorly controlled ?            -- Catapress 2 mg TID, metoprolol increased to 75 mg BID and Norvasc 10 qd. ?  -will begin scheduled hydralazine 50mg  q8 ? -will use addnl hydralazine prn  ? On Norvasc; Hydralazine 100 mg q8 hours and Clonidine patch 0.3 and metoprolol 75  mg BID ?Vitals:  ? 05/08/21 1405 05/09/21 0603  ?BP: (!) 149/94 (!) 144/84  ?Pulse: 61 64  ?Resp: 18 18  ?Temp: (!) 97.4 ?F (36.3 ?C) 98.1 ?F (36.7 ?C)  ?SpO2: 100% 99%  ? ?3/24- BP a little better- last change was increasing  Clonidine to 0.3 mg/patch this week ?9. Persistent hypokalemia: Start Kdur 20 meq daily. Check Mg level ? 3/3- Will replete 40 mEq x2 and recheck Monday- last Mg on 2/28 was 2.3 ? 3/6- K+ up to 3.6- con't to monitor ? 3/8- labs in AM ? 3/9- Mg 1.8 and K+ 3.5- will con't to monitor ? 3/12- labs Monday  ? 3/14- K+ 3.7- con't to monitor ? 3/16- K+ 3.5- still in normal range, although low- will ask pt to eat bananas, etc ?10.  Severe LVH: Has been referred to cardiology for work up. Continue metoprolol bid.  ?11. 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  ?--Renal ultrasound with mild evidence of medical renal disease ?            --encourage fluid intake. ?3/3- down slightly to 2.21- will avoid nephrotoxic agents ?3/9- Renal U/S normal- Cr up slightly to 1.99 and BUN 25- think it's leveling out- will con't to monitor ?3/13- Cr up slightly more to 2.08- and BUN 23- will recheck in Am and push fluids- also, might have infection/UTI which could affect Cr slightly? ? 3/14- U/A (-) and Cr up again to 2.12 and BUN 23- has (+) tenting- asked pt to drink 6-8 cups/day and will recheck Thursday ?3/16- Cr up to 1.95 and BUN 21- doing much better when drinks- con't regimen ?  ? ?  Latest Ref Rng & Units 05/05/2021  ?  8:22 AM 05/01/2021  ?  5:24 AM 04/29/2021  ?  5:37 AM  ?BMP  ?Glucose 70 - 99 mg/dL 153   84   83    ?BUN 6 - 20 mg/dL 17   21   23     ?Creatinine 0.61 - 1.24 mg/dL 2.07   1.95   2.12    ?Sodium 135 - 145 mmol/L 139   139   140    ?Potassium 3.5 - 5.1 mmol/L 3.5   3.5   3.7    ?Chloride 98 - 111 mmol/L 105   106   109    ?CO2 22 - 32 mmol/L 23   26   25     ?Calcium 8.9 - 10.3 mg/dL 9.2   8.9   8.8    ? Stable 3/20- enc po ?3/24- will check labs MOnday  ?12,  Elevated AST: recheck in am. ?13. Hyperlipidemia: Continue Lipitor.  ?14. ABLA: Likely due to bleed with drop from 14.4-->12.6.  ?            --monitor for signs of bleeding. Recheck CBC Monday  ? 3/6- H

## 2021-05-09 NOTE — Plan of Care (Signed)
?  Problem: RH Swallowing Goal: LTG Patient will consume least restrictive diet using compensatory strategies with assistance (SLP) Description: LTG:  Patient will consume least restrictive diet using compensatory strategies with assistance (SLP) Outcome: Completed/Met   

## 2021-05-09 NOTE — Progress Notes (Signed)
Occupational Therapy Session Note ? ?Patient Details  ?Name: Miguel Mccullough ?MRN: [id number] ?Date of Birth: Mar 13, 1965 ? ?Today's Date: 05/09/2021 ?OT Individual Time: 7510-2585 ?OT Individual Time Calculation (min): 58 min  ? ? ?Short Term Goals: ?Week 1:  OT Short Term Goal 1 (Week 1): Pt will thread Lt UE into shirt with Mod A and vcs to improve Lt attention ?OT Short Term Goal 1 - Progress (Week 1): Progressing toward goal ?OT Short Term Goal 2 (Week 1): Pt will verbalize 2 joint protection strategies to use during bed mobility and/or transfers to improve Lt attention ?OT Short Term Goal 2 - Progress (Week 1): Progressing toward goal ?OT Short Term Goal 3 (Week 1): Education will be provided to pts wife in regards to 2 hemi positioning strategies to implement at home for caregiver education ?OT Short Term Goal 3 - Progress (Week 1): Met ?Week 2:  OT Short Term Goal 1 (Week 2): Pt will verbalize 2 joint protection strategies to use during bed mobility and/or transfers to improve Lt attention ?OT Short Term Goal 1 - Progress (Week 2): Met ?OT Short Term Goal 2 (Week 2): Pt will thread Lt UE into shirt with Mod A and vcs to improve Lt attention ?OT Short Term Goal 2 - Progress (Week 2): Met ?OT Short Term Goal 3 (Week 2): Pt will perform AAROM/PROM HEP for LUE with no more than MIN verbal cues ?OT Short Term Goal 3 - Progress (Week 2): Met ?OT Short Term Goal 4 (Week 2): Pt will perform sit <> stand with LRAD with Mod A of 1 in prep for LB ADL ?OT Short Term Goal 4 - Progress (Week 2): Met ?Week 3:  OT Short Term Goal 2 (Week 3): STG=LTG 2/2 ELOS ? ?Patient met seated in wc in agreement with OT treatment session. 0/10 pain reported at rest and with activity. Patient able to self-propel wc to gym on 4th floor navigating in hallways with increased attention to L without need for cues this date. Able to position wc near mat table with Min A in prep for squat-pivot transfer. Completed all transfers with CGA this date.  Focus shifted to LUE NMR with scapular mobilization, e-stim on shoulder (please refer below for additional details) and AAROM in gravity minimized plane. Patient with noted MMT 1/5 at wrist flexors, elbow flexors, elbow extensors and internal rotators. Chest press x10 reps with LUE ace wrapped to weighted dowel. Noted increased flexor tone in LUE and increased extensor tone in LLE. All other muscle groups 0/5. Bed mobility with mat table elevated to simulate height of bed at home with patient able to transition from supine to EOB several times with good recall for hemi technique and only CGA. Session concluded with patient seated in wc with call bell within reach and all needs met.  ? ?1:1 NMES applied to supraspinatus and middle deltoid to help approximate shoulder joint to reduce risk of sublux. No pain reported in shoulder at rest. No adverse reactions after treatment and is skin intact.  ? ?Ratio 1:3 ?Rate 35 pps ?Waveform- Asymmetric ?Ramp 1.0 ?Pulse 300 ?Intensity- 25 ?Duration - 10 min ? ? ? ?Therapy Documentation ?Precautions:  ?Precautions ?Precautions: Fall ?Precaution Comments: Flaccid Lt hemi, hx scoliosis, HTN ?Restrictions ?Weight Bearing Restrictions: No ?General: ?  ?Therapy/Group: Individual Therapy ? ?[place]ab Sachse R [place] ?05/09/2021, 6:52 AM ?

## 2021-05-10 DIAGNOSIS — R252 Cramp and spasm: Secondary | ICD-10-CM

## 2021-05-10 NOTE — Progress Notes (Signed)
?                                                       PROGRESS NOTE ? ? ?Subjective/Complaints: ?Pt up with wife. No new issues. Sleeping well. Happy with rehab progress ? ?ROS: Patient denies fever, rash, sore throat, blurred vision, dizziness, nausea, vomiting, diarrhea, cough, shortness of breath or chest pain, joint or back/neck pain, headache, or mood change.  ? ?Objective: ?  ?No results found. ?No results for input(s): WBC, HGB, HCT, PLT in the last 72 hours. ? ? ? ?No results for input(s): NA, K, CL, CO2, GLUCOSE, BUN, CREATININE, CALCIUM in the last 72 hours. ? ? ? ? ?Intake/Output Summary (Last 24 hours) at 05/10/2021 1522 ?Last data filed at 05/10/2021 0700 ?Gross per 24 hour  ?Intake 356 ml  ?Output --  ?Net 356 ml  ?  ? ?  ? ?Physical Exam: ?Vital Signs ?Blood pressure (!) 147/87, pulse 64, temperature 98.6 ?F (37 ?C), temperature source Oral, resp. rate 16, height 5\' 4"  (1.626 m), weight 76.2 kg, SpO2 96 %. ? ? ?Constitutional: No distress . Vital signs reviewed. ?HEENT: NCAT, EOMI, oral membranes moist ?Neck: supple ?Cardiovascular: RRR without murmur. No JVD    ?Respiratory/Chest: CTA Bilaterally without wheezes or rales. Normal effort    ?GI/Abdomen: BS +, non-tender, non-distended ?Ext: no clubbing, cyanosis, 1+ LLE edema ?Psych: pleasant and cooperative  ?  ?Musculoskeletal:  ?   Cervical back: Neck supple. No tenderness.  ? 0/5 on L side still ?Skin: ?   General: Skin is warm and dry.  ?Neurological:  ?   Mental Status: He is alert and oriented to person, place, and time.  ?   Comments: Mild left facial weakness with left inattention and mild left hemifacial sensory loss.  Speech clear and able to follow commands without difficulty. Dense left hemiplegia with sensory deficits. LUE /LLE 0/5 except for trace at foot. RUE and RLE 5/5. MAS 1+ Left shoulder/elbow ?  ?  ? ?Assessment/Plan: ?1. Functional deficits which require 3+ hours per day of interdisciplinary therapy in a comprehensive inpatient  rehab setting. ?Physiatrist is providing close team supervision and 24 hour management of active medical problems listed below. ?Physiatrist and rehab team continue to assess barriers to discharge/monitor patient progress toward functional and medical goals ? ?Care Tool: ? ?Bathing ?   ?Body parts bathed by patient: Chest, Abdomen, Front perineal area, Right upper leg, Left upper leg, Face, Left arm, Right lower leg, Left lower leg  ? Body parts bathed by helper: Buttocks, Right arm, Right lower leg, Left lower leg ?  ?  ?Bathing assist Assist Level: Moderate Assistance - Patient 50 - 74% (Per family report; wife prefers to assist patient with bathing) ?  ?  ?Upper Body Dressing/Undressing ?Upper body dressing   ?What is the patient wearing?: Pull over shirt ?   ?Upper body assist Assist Level: Minimal Assistance - Patient > 75% ?   ?Lower Body Dressing/Undressing ?Lower body dressing ? ? ?   ?What is the patient wearing?: Pants, Underwear/pull up ? ?  ? ?Lower body assist Assist for lower body dressing: Moderate Assistance - Patient 50 - 74% ?   ? ?Toileting ?Toileting Toileting Activity did not occur (Probation officer and hygiene only): N/A (no void or bm)  ?Toileting assist Assist  for toileting: Moderate Assistance - Patient 50 - 74% ?  ?  ?Transfers ?Chair/bed transfer ? ?Transfers assist ?   ? ?Chair/bed transfer assist level: Minimal Assistance - Patient > 75% ?  ?  ?Locomotion ?Ambulation ? ? ?Ambulation assist ? ?   ? ?Assist level: Moderate Assistance - Patient 50 - 74% ?Assistive device: Walker-rolling ?Max distance: 8  ? ?Walk 10 feet activity ? ? ?Assist ? Walk 10 feet activity did not occur: Safety/medical concerns ? ?Assist level: Minimal Assistance - Patient > 75% ?Assistive device: Other (comment) (hall rail)  ? ?Walk 50 feet activity ? ? ?Assist Walk 50 feet with 2 turns activity did not occur: Safety/medical concerns ? ?Assist level: 2 helpers ?Assistive device: Lite Gait  ? ? ?Walk 150 feet  activity ? ? ?Assist Walk 150 feet activity did not occur: Safety/medical concerns ? ?Assist level: 2 helpers ?Assistive device: Lite Gait ?  ? ?Walk 10 feet on uneven surface  ?activity ? ? ?Assist Walk 10 feet on uneven surfaces activity did not occur: Safety/medical concerns ? ? ?  ?   ? ?Wheelchair ? ? ? ? ?Assist Is the patient using a wheelchair?: Yes ?Type of Wheelchair: Manual ?  ? ?Wheelchair assist level: Supervision/Verbal cueing ?Max wheelchair distance: 90  ? ? ?Wheelchair 50 feet with 2 turns activity ? ? ? ?Assist ? ?  ?  ? ? ?Assist Level: Supervision/Verbal cueing  ? ?Wheelchair 150 feet activity  ? ? ? ?Assist ?   ? ? ?Assist Level: Supervision/Verbal cueing  ? ?Blood pressure (!) 147/87, pulse 64, temperature 98.6 ?F (37 ?C), temperature source Oral, resp. rate 16, height 5\' 4"  (1.626 m), weight 76.2 kg, SpO2 96 %. ? ?Medical Problem List and Plan: ?1. 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            -patient may  shower ?            -ELOS/Goals: 14-20 days- min A to supervision ? D/c date 3/30 ? -Continue CIR therapies including PT, OT, and SLP  ?2.  Antithrombotics: ?-DVT/anticoagulation:  lovenox ? -dopplers reviewed and negative ?            -antiplatelet therapy: N/A due to bleed.  ?3. Pain Management:: On tylenol as needed ?4. Mood: LCSW to follow for evaluation an dsup ?            -antipsychotic agents: N/A ?5. Neuropsych: This patient is capable of making decisions on his own behalf. ?6. Skin/Wound Care: Routine pressure relief measures.  ?7. Fluids/Electrolytes/Nutrition: Monitor I/O. Check CMET in am.  ?8. HTN: goal to keep SBP< 160 ? -3/5 bp remains poorly controlled ?            -- Catapress 2 mg TID, metoprolol increased to 75 mg BID and Norvasc 10 qd. ?  -will begin scheduled hydralazine 50mg  q8 ? -will use addnl hydralazine prn  ? On Norvasc; Hydralazine 100 mg q8 hours and Clonidine patch 0.3 and metoprolol 75 mg BID ?Vitals:  ?  05/09/21 2000 05/10/21 0603  ?BP:  (!) 147/87  ?Pulse: 65 64  ?Resp:  16  ?Temp:  98.6 ?F (37 ?C)  ?SpO2:  96%  ? ?3/25 borderline but control overall much improved.  ?9. Persistent hypokalemia: Start Kdur 20 meq daily. Check Mg level ? 3/3- Will replete 40 mEq x2 and recheck Monday- last Mg on 2/28 was 2.3 ? 3/6- K+ up  to 3.6- con't to monitor ? 3/8- labs in AM ? 3/9- Mg 1.8 and K+ 3.5- will con't to monitor ? 3/12- labs Monday  ? 3/14- K+ 3.7- con't to monitor ? 3/16- K+ 3.5- still in normal range, although low- will ask pt to eat bananas, etc ?10.  Severe LVH: Has been referred to cardiology for work up. Continue metoprolol bid.  ?11. 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  ?--Renal ultrasound with mild evidence of medical renal disease ?            --encourage fluid intake. ?3/3- down slightly to 2.21- will avoid nephrotoxic agents ?3/9- Renal U/S normal- Cr up slightly to 1.99 and BUN 25- think it's leveling out- will con't to monitor ?3/13- Cr up slightly more to 2.08- and BUN 23- will recheck in Am and push fluids- also, might have infection/UTI which could affect Cr slightly? ? 3/14- U/A (-) and Cr up again to 2.12 and BUN 23- has (+) tenting- asked pt to drink 6-8 cups/day and will recheck Thursday ?3/16- Cr up to 1.95 and BUN 21- doing much better when drinks- con't regimen ?  ? ?  Latest Ref Rng & Units 05/05/2021  ?  8:22 AM 05/01/2021  ?  5:24 AM 04/29/2021  ?  5:37 AM  ?BMP  ?Glucose 70 - 99 mg/dL 153   84   83    ?BUN 6 - 20 mg/dL 17   21   23     ?Creatinine 0.61 - 1.24 mg/dL 2.07   1.95   2.12    ?Sodium 135 - 145 mmol/L 139   139   140    ?Potassium 3.5 - 5.1 mmol/L 3.5   3.5   3.7    ?Chloride 98 - 111 mmol/L 105   106   109    ?CO2 22 - 32 mmol/L 23   26   25     ?Calcium 8.9 - 10.3 mg/dL 9.2   8.9   8.8    ? Stable 3/20- enc po ?3/25 labs pending for Monday ?13. Hyperlipidemia: Continue Lipitor.  ?14. ABLA: Likely due to bleed with drop from 14.4-->12.6.   ?            --monitor for signs of bleeding. Recheck CBC Monday  ? 3/6- Hb up to 13.7- doing better even with improvement in BUN/Cr.  ?15. L neglect and lethargy-pt appears more alert ? -monitor for need o

## 2021-05-10 NOTE — Progress Notes (Signed)
Physical Therapy Session Note ? ?Patient Details  ?Name: Miguel Mccullough ?MRN: [id number] ?Date of Birth: 09-26-1965 ? ?Today's Date: 05/10/2021 ?PT Individual Time: 1015-1100 ?PT Individual Time Calculation (min): 45 min  ? ?Short Term Goals: ?Week 3:  PT Short Term Goal 1 (Week 3): Pt will ambulate with LRAD with mod A or better x 50 ft ?PT Short Term Goal 1 - Progress (Week 3): Met ?PT Short Term Goal 2 (Week 3): Pt will perform squat pivot transfer with supervision ?PT Short Term Goal 2 - Progress (Week 3): Met ?PT Short Term Goal 3 (Week 3): Pt will initiate standing transfer with LRAD ?PT Short Term Goal 3 - Progress (Week 3): Met ? ?Skilled Therapeutic Interventions/Progress Updates: Pt wheeled self w/ hemi technique to 5th floor gym w/ supervision.  Ace wrap applied to L foot for DF assist.  Pt amb w/ RW and mod A x 8' w/c to mat table.  Pt still requires mod A for LLE advancement especially w/ placement of fot, tends to be ER.  Pt performed scooting on mat table w/ min A and verbal cues for clearing hips from table and then scooting LLE.  Pt amb 8' to w/c w/ mod A, pt performing placement of LUE into ortho aide during standing.  Pt transferred sit to stand transfers w/ min A throughout session.  Pt amb x 4' forward and then backward to w/c w/ mod A and verbal cues for sequencing, manual assist for RW.  Pt returned to room w/ supervision and then transferred w/c>recliner w/ min A from side.  Elevated leg rests and all needs in reach. ?   ? ?Therapy Documentation ?Precautions:  ?Precautions ?Precautions: Fall ?Precaution Comments: Flaccid Lt hemi, hx scoliosis, HTN ?Restrictions ?Weight Bearing Restrictions: No ?General: ?  ?Vital Signs: ? ?Pain:0/10 ?  ? ? ? ? ?Therapy/Group: Individual Therapy ? ?[name] ?05/10/2021, 12:24 PM  ?

## 2021-05-11 NOTE — Progress Notes (Signed)
Occupational Therapy Session Note ? ?Patient Details  ?Name: Miguel Mccullough ?MRN: [id number] ?Date of Birth: 1966/01/22 ? ?Today's Date: 05/11/2021 ?OT Individual Time: [id number] ?OT Individual Time Calculation (min): 60 min  ? ? ?Short Term Goals: ?Week 1:  OT Short Term Goal 1 (Week 1): Pt will thread Lt UE into shirt with Mod A and vcs to improve Lt attention ?OT Short Term Goal 1 - Progress (Week 1): Progressing toward goal ?OT Short Term Goal 2 (Week 1): Pt will verbalize 2 joint protection strategies to use during bed mobility and/or transfers to improve Lt attention ?OT Short Term Goal 2 - Progress (Week 1): Progressing toward goal ?OT Short Term Goal 3 (Week 1): Education will be provided to pts wife in regards to 2 hemi positioning strategies to implement at home for caregiver education ?OT Short Term Goal 3 - Progress (Week 1): Met ?Week 2:  OT Short Term Goal 1 (Week 2): Pt will verbalize 2 joint protection strategies to use during bed mobility and/or transfers to improve Lt attention ?OT Short Term Goal 1 - Progress (Week 2): Met ?OT Short Term Goal 2 (Week 2): Pt will thread Lt UE into shirt with Mod A and vcs to improve Lt attention ?OT Short Term Goal 2 - Progress (Week 2): Met ?OT Short Term Goal 3 (Week 2): Pt will perform AAROM/PROM HEP for LUE with no more than MIN verbal cues ?OT Short Term Goal 3 - Progress (Week 2): Met ?OT Short Term Goal 4 (Week 2): Pt will perform sit <> stand with LRAD with Mod A of 1 in prep for LB ADL ?OT Short Term Goal 4 - Progress (Week 2): Met ?Week 3:  OT Short Term Goal 2 (Week 3): STG=LTG 2/2 ELOS ? ?Skilled Therapeutic Interventions/Progress Updates:  ?Patient met seated in wc in agreement with OT treatment session. 0/10 pain reported at rest and with activity. Session with focus on therapeutic activity and LUE NMR as detailed below. Patient able to self-propel wc to rehab gym on 4th floor without cues for attention to L. Completed squat-pivot wc <> mat table with CGA.  Placed LUE in gait belt to maintain integrity of shoulder joint. NMES to biceps, triceps, and wrist extensors. Please refer below for additional details. Sit <> supine with light Min A to CGA with Min cues for hemi technique. Patient then completed mobility 1f x2 trials with RW and assist to advance LLE simulating ambulatory toilet transfer. Will continue patient/family education on ambulatory toilet transfers at time of next treatment session. Total A for wc transport back to hospital room for time management. Wc > recliner via squat-pivot with CGA at LLE. Session concluded with patient seated in recliner with call bell within reach and all needs met.  ? ?1:1 NMES applied to triceps initially. Initiated e-stim biceps then wrist extensors. No c/o pain or s/s of skin breakdown after treatment.  ? ?Ratio 1:3 ?Rate 35 pps ?Waveform- Asymmetric ?Ramp 1.0 ?Pulse 300 ?Intensity- 30-35 ?Duration -  5-8 min ? ? ?Therapy Documentation ?Precautions:  ?Precautions ?Precautions: Fall ?Precaution Comments: Flaccid Lt hemi, hx scoliosis, HTN ?Restrictions ?Weight Bearing Restrictions: No ?General: ?  ?Therapy/Group: Individual Therapy ? ?Dracen Reigle R How[place]-Davis ?05/11/2021, 7:04 AM ?

## 2021-05-12 LAB — BASIC METABOLIC PANEL
Anion gap: 8 (ref 5–15)
BUN: 17 mg/dL (ref 6–20)
CO2: 28 mmol/L (ref 22–32)
Calcium: 8.9 mg/dL (ref 8.9–10.3)
Chloride: 106 mmol/L (ref 98–111)
Creatinine, Ser: 1.86 mg/dL — ABNORMAL HIGH (ref 0.61–1.24)
GFR, Estimated: 43 mL/min — ABNORMAL LOW (ref >60)
Glucose, Bld: 84 mg/dL (ref 70–99)
Potassium: 3.2 mmol/L — ABNORMAL LOW (ref 3.5–5.1)
Sodium: 142 mmol/L (ref 135–145)

## 2021-05-12 LAB — CBC
HCT: 33.9 % — ABNORMAL LOW (ref 39.0–52.0)
Hemoglobin: 11.4 g/dL — ABNORMAL LOW (ref 13.0–17.0)
MCH: 29 pg (ref 26.0–34.0)
MCHC: 33.6 g/dL (ref 30.0–36.0)
MCV: 86.3 fL (ref 80.0–100.0)
Platelets: 210 10*3/uL (ref 150–400)
RBC: 3.93 MIL/uL — ABNORMAL LOW (ref 4.22–5.81)
RDW: 13 % (ref 11.5–15.5)
WBC: 7.6 10*3/uL (ref 4.0–10.5)
nRBC: 0 % (ref 0.0–0.2)

## 2021-05-12 MED ORDER — POTASSIUM CHLORIDE CRYS ER 20 MEQ PO TBCR
40.0000 meq | EXTENDED_RELEASE_TABLET | Freq: Once | ORAL | Status: AC
  Administered 2021-05-12: 40 meq via ORAL
  Filled 2021-05-12: qty 2

## 2021-05-12 MED ORDER — POTASSIUM CHLORIDE CRYS ER 20 MEQ PO TBCR: 20.0000 meq | EXTENDED_RELEASE_TABLET | Freq: Two times a day (BID) | ORAL | Status: DC

## 2021-05-12 MED ORDER — POTASSIUM CHLORIDE CRYS ER 20 MEQ PO TBCR: 20.0000 meq | EXTENDED_RELEASE_TABLET | Freq: Once | ORAL | Status: DC

## 2021-05-12 MED ORDER — POTASSIUM CHLORIDE CRYS ER 20 MEQ PO TBCR
20.0000 meq | EXTENDED_RELEASE_TABLET | Freq: Two times a day (BID) | ORAL | Status: DC
  Administered 2021-05-13 – 2021-05-15 (×5): 20 meq via ORAL
  Filled 2021-05-12 (×5): qty 1

## 2021-05-12 NOTE — Progress Notes (Signed)
?                                                       PROGRESS NOTE ? ? ?Subjective/Complaints: ?Pt sitting up with wife- no issues.  ?Spasticity slightly better with no side effects from increase in Baclofen.  ? ? ? ? ?ROS:  ?Pt denies SOB, abd pain, CP, N/V/C/D, and vision changes ? ?Objective: ?  ?No results found. ?Recent Labs  ?  05/12/21 ?0613  ?WBC 7.6  ?HGB 11.4*  ?HCT 33.9*  ?PLT 210  ? ? ? ? ?Recent Labs  ?  05/12/21 ?0613  ?NA 142  ?K 3.2*  ?CL 106  ?CO2 28  ?GLUCOSE 84  ?BUN 17  ?CREATININE 1.86*  ?CALCIUM 8.9  ? ? ? ? ? ?Intake/Output Summary (Last 24 hours) at 05/12/2021 1909 ?Last data filed at 05/12/2021 1313 ?Gross per 24 hour  ?Intake 840 ml  ?Output --  ?Net 840 ml  ?  ? ?  ? ?Physical Exam: ?Vital Signs ?Blood pressure (!) 147/85, pulse (!) 57, temperature 98.2 ?F (36.8 ?C), temperature source Oral, resp. rate 14, height 5\' 4"  (1.626 m), weight 76.2 kg, SpO2 100 %. ? ? ?General: awake, alert, appropriate, sitting in bedside chair; wife at [place] ?HENT: conjugate gaze; oropharynx moist ?CV: regular rate; no JVD ?Pulmonary: CTA B/L; no W/R/R- good air movement ?GI: soft, NT, ND, (+)BS ?Psychiatric: appropriate ?Neurological: very alert; appropriate- MAS of 2 in L shoulder esp with external rotation; MAS 1+ to 2 in L elbow ?Ext: no clubbing, cyanosis, 1+ LLE edema ?Psych: pleasant and cooperative  ?  ?Musculoskeletal:  ?   Cervical back: Neck supple. No tenderness.  ? 0/5 on L side still ?Skin: ?   General: Skin is warm and dry.  ?Neurological:  ?   Mental Status: He is alert and oriented to person, place, and time.  ?   Comments: Mild left facial weakness with left inattention and mild left hemifacial sensory loss.  Speech clear and able to follow commands without difficulty. Dense left hemiplegia with sensory deficits. LUE /LLE 0/5 except for trace at foot. RUE and RLE 5/5. MAS 1+ Left shoulder/elbow ?  ?  ? ?Assessment/Plan: ?1. Functional deficits which require 3+ hours per day of  interdisciplinary therapy in a comprehensive inpatient rehab setting. ?Physiatrist is providing close team supervision and 24 hour management of active medical problems listed below. ?Physiatrist and rehab team continue to assess barriers to discharge/monitor patient progress toward functional and medical goals ? ?Care Tool: ? ?Bathing ?   ?Body parts bathed by patient: Chest, Abdomen, Front perineal area, Right upper leg, Left upper leg, Face, Left arm, Right lower leg, Left lower leg  ? Body parts bathed by helper: Buttocks, Right arm, Right lower leg, Left lower leg ?  ?  ?Bathing assist Assist Level: Moderate Assistance - Patient 50 - 74% (Per family report; wife prefers to assist patient with bathing) ?  ?  ?Upper Body Dressing/Undressing ?Upper body dressing   ?What is the patient wearing?: Pull over shirt ?   ?Upper body assist Assist Level: Minimal Assistance - Patient > 75% ?   ?Lower Body Dressing/Undressing ?Lower body dressing ? ? ?   ?What is the patient wearing?: Pants, Underwear/pull up ? ?  ? ?Lower body assist Assist for lower  body dressing: Moderate Assistance - Patient 50 - 74% ?   ? ?Toileting ?Toileting Toileting Activity did not occur (Probation officer and hygiene only): N/A (no void or bm)  ?Toileting assist Assist for toileting: Moderate Assistance - Patient 50 - 74% ?  ?  ?Transfers ?Chair/bed transfer ? ?Transfers assist ?   ? ?Chair/bed transfer assist level: Minimal Assistance - Patient > 75% ?  ?  ?Locomotion ?Ambulation ? ? ?Ambulation assist ? ?   ? ?Assist level: Moderate Assistance - Patient 50 - 74% ?Assistive device: Walker-rolling ?Max distance: 8  ? ?Walk 10 feet activity ? ? ?Assist ? Walk 10 feet activity did not occur: Safety/medical concerns ? ?Assist level: Minimal Assistance - Patient > 75% ?Assistive device: Other (comment) (hall rail)  ? ?Walk 50 feet activity ? ? ?Assist Walk 50 feet with 2 turns activity did not occur: Safety/medical concerns ? ?Assist level: 2  helpers ?Assistive device: Lite Gait  ? ? ?Walk 150 feet activity ? ? ?Assist Walk 150 feet activity did not occur: Safety/medical concerns ? ?Assist level: 2 helpers ?Assistive device: Lite Gait ?  ? ?Walk 10 feet on uneven surface  ?activity ? ? ?Assist Walk 10 feet on uneven surfaces activity did not occur: Safety/medical concerns ? ? ?  ?   ? ?Wheelchair ? ? ? ? ?Assist Is the patient using a wheelchair?: Yes ?Type of Wheelchair: Manual ?  ? ?Wheelchair assist level: Supervision/Verbal cueing ?Max wheelchair distance: 90  ? ? ?Wheelchair 50 feet with 2 turns activity ? ? ? ?Assist ? ?  ?  ? ? ?Assist Level: Supervision/Verbal cueing  ? ?Wheelchair 150 feet activity  ? ? ? ?Assist ?   ? ? ?Assist Level: Supervision/Verbal cueing  ? ?Blood pressure (!) 147/85, pulse (!) 57, temperature 98.2 ?F (36.8 ?C), temperature source Oral, resp. rate 14, height 5\' 4"  (1.626 m), weight 76.2 kg, SpO2 100 %. ? ?Medical Problem List and Plan: ?1. 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            -patient may  shower ?            -ELOS/Goals: 14-20 days- min A to supervision ? D/c date 3/30 ? Continue CIR- PT, OT and SLP ?2.  Antithrombotics: ?-DVT/anticoagulation:  lovenox ? -dopplers reviewed and negative ?            -antiplatelet therapy: N/A due to bleed.  ?3. Pain Management:: On tylenol as needed ?4. Mood: LCSW to follow for evaluation an dsup ?            -antipsychotic agents: N/A ?5. Neuropsych: This patient is capable of making decisions on his own behalf. ?6. Skin/Wound Care: Routine pressure relief measures.  ?7. Fluids/Electrolytes/Nutrition: Monitor I/O. Check CMET in am.  ?8. HTN: goal to keep SBP< 160 ? -3/5 bp remains poorly controlled ?            -- Catapress 2 mg TID, metoprolol increased to 75 mg BID and Norvasc 10 qd. ?  -will begin scheduled hydralazine 50mg  q8 ? -will use addnl hydralazine prn  ? On Norvasc; Hydralazine 100 mg q8 hours and Clonidine patch  0.3 and metoprolol 75 mg BID ?Vitals:  ? 05/12/21 0617 05/12/21 1358  ?BP: (!) 142/84 (!) 147/85  ?Pulse: 61 (!) 57  ?Resp: 18 14  ?Temp: 99.1 ?F (37.3 ?C) 98.2 ?F (36.8 ?C)  ?SpO2: 98% 100%  ? ?33/27- borderline  but better controlled- con't regimen  ?9. Persistent hypokalemia: Start Kdur 20 meq daily. Check Mg level ? 3/3- Will replete 40 mEq x2 and recheck Monday- last Mg on 2/28 was 2.3 ? 3/6- K+ up to 3.6- con't to monitor ? 3/8- labs in AM ? 3/9- Mg 1.8 and K+ 3.5- will con't to monitor ? 3/12- labs Monday  ? 3/14- K+ 3.7- con't to monitor ? 3/16- K+ 3.5- still in normal range, although low- will ask pt to eat bananas, etc ? 3/27- K+ 3.2 today- repleted- will recheck before d/c. Also recheck Mg level ?10.  Severe LVH: Has been referred to cardiology for work up. Continue metoprolol bid.  ?11. 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  ?--Renal ultrasound with mild evidence of medical renal disease ?            --encourage fluid intake. ?3/3- down slightly to 2.21- will avoid nephrotoxic agents ?3/9- Renal U/S normal- Cr up slightly to 1.99 and BUN 25- think it's leveling out- will con't to monitor ?3/13- Cr up slightly more to 2.08- and BUN 23- will recheck in Am and push fluids- also, might have infection/UTI which could affect Cr slightly? ? 3/14- U/A (-) and Cr up again to 2.12 and BUN 23- has (+) tenting- asked pt to drink 6-8 cups/day and will recheck Thursday ?3/16- Cr up to 1.95 and BUN 21- doing much better when drinks- con't regimen ?  ? ?  Latest Ref Rng & Units 05/12/2021  ?  6:13 AM 05/05/2021  ?  8:22 AM 05/01/2021  ?  5:24 AM  ?BMP  ?Glucose 70 - 99 mg/dL 84   153   84    ?BUN 6 - 20 mg/dL 17   17   21     ?Creatinine 0.61 - 1.24 mg/dL 1.86   2.07   1.95    ?Sodium 135 - 145 mmol/L 142   139   139    ?Potassium 3.5 - 5.1 mmol/L 3.2   3.5   3.5    ?Chloride 98 - 111 mmol/L 106   105   106    ?CO2 22 - 32 mmol/L 28   23   26     ?Calcium 8.9 - 10.3 mg/dL 8.9    9.2   8.9    ?  3/27- Cr better at 1.86- con't to monitor- has CKD Stage 3B ?13. Hyperlipidemia: Continue Lipitor.  ?14. ABLA: Likely due to bleed with drop from 14.4-->12.6.  ?            --monitor for signs of bleedi

## 2021-05-12 NOTE — Progress Notes (Signed)
Speech Language Pathology Daily Session Note ? ?Patient Details  ?Name: Miguel Mccullough ?MRN: [id number] ?Date of Birth: 1965/03/26 ? ?Today's Date: 05/12/2021 ?SLP Individual Time: 1403-1500 ?SLP Individual Time Calculation (min): 57 min ? ?Short Term Goals: ?Week 4: SLP Short Term Goal 1 (Week 4): STG = LTG d/t ELOS ? ?Skilled Therapeutic Interventions:Skilled ST services focused on cognitive skills. SLP facilitated executive function skills in (familiar set up but novel in content) scheduling tasks, pt required min A fade to supervision A verbal cues. Pt demonstrated improvement in note taking, thinking aloud, organization and correcting errors. Pt was left in room with call bell within reach and chair alarm set. SLP recommends to continue skilled services. ?   ? ?Pain ?Pain Assessment ?Pain Scale: 0-10 ?Pain Score: 0-No pain ?Faces Pain Scale: No hurt ? ?Therapy/Group: Individual Therapy ? ?Miguel Mccullough ?05/12/2021, 3:10 PM ?

## 2021-05-12 NOTE — Progress Notes (Signed)
Patient ID: Miguel Mccullough, male   DOB: 1965-10-01, 57 y.o.   MRN: [id number] Updated both pt and wife was not able to find a wheelchair when checked hospice thrift store or [employer]. Wife will work on getting wheelchair and drop-arm bedside commode. Will try to get charity home health for him.  ?

## 2021-05-12 NOTE — Progress Notes (Signed)
Orthopedic Tech Progress Note ?Patient Details:  ?ELISANDRO JARRETT ?1966-02-06 ?[id number] ? ?Called in order to [facility] for an AFO CONSULT  ? ?Patient ID: MAXMILIAN TROSTEL, male   DOB: [birth year], 56 y.o.   MRN: [id number] ? ?Janit Pagan ?05/12/2021, 2:21 PM ? ?

## 2021-05-12 NOTE — Progress Notes (Signed)
Patient with recurrent drop in potassium with decrease in dose. Will supplement with 40 meq additionally today and increase Kdur to 20 meq BID. Had pharmacy review meds and no offending meds but recommended checking Mg level -->ordered as he had low K+ at admission.  ?

## 2021-05-12 NOTE — Progress Notes (Signed)
Occupational Therapy Session Note ? ?Patient Details  ?Name: Miguel Mccullough ?MRN: [id number] ?Date of Birth: March 23, 1965 ? ?Today's Date: 05/12/2021 ?OT Individual Time: 2919-1660 ?OT Individual Time Calculation (min): 45 min  ? ? ?Short Term Goals: ?Week 3:  OT Short Term Goal 2 (Week 3): STG=LTG 2/2 ELOS ? ?Skilled Therapeutic Interventions/Progress Updates:  ?  Pt resting in w/c upon arrival with wife present. OT intervention with focus on w/c mobility, stand pivot transfers, sitting balance, LUE functional tasks, and safety awareness to increase independence with BADLs. W/c moblity with supervision. Stand pivot transfers with min A and mod verbal cues for sequencing and safety. Pt with increased tone inhibiting functional movement. Weight bearing through LUE while reaching cross body to inhibit flexor tone. LUE AAROM for shoulder flexion/extension with minimal and inconsistent activation noted. Pt returned to room and performed squat pivot transfer to recliner with CGA. Pt remained in recliner with all needs within reach.  ? ?Therapy Documentation ?Precautions:  ?Precautions ?Precautions: Fall ?Precaution Comments: Flaccid Lt hemi, hx scoliosis, HTN ?Restrictions ?Weight Bearing Restrictions: No ? ?Pain: ?Pain Assessment ?Pain Scale: 0-10 ?Pain Score: 0-No pain ?Faces Pain Scale: No hurt ? ? ?Therapy/Group: Individual Therapy ? ?Leroy Libman ?05/12/2021, 1:48 PM ?

## 2021-05-12 NOTE — Progress Notes (Signed)
Physical Therapy Session Note ? ?Patient Details  ?Name: Miguel Mccullough ?MRN: [id number] ?Date of Birth: [birth year] ? ?Today's Date: 05/12/2021 ?PT Individual Time: 5916-3846 ?PT Individual Time Calculation (min): 73 min  ? ?Short Term Goals: ?Week 1:  PT Short Term Goal 1 (Week 1): Pt will perform bed to wc transfers to R w/min assist and light cues, to L w/mod to max assist of 1 ?PT Short Term Goal 1 - Progress (Week 1): Met ?PT Short Term Goal 2 (Week 1): Pt will sit statically w/feet supported w/cga ?PT Short Term Goal 2 - Progress (Week 1): Met ?PT Short Term Goal 3 (Week 1): Pt will ambulate up to 92f w/LRAD and 2 person assist ?PT Short Term Goal 3 - Progress (Week 1): Met ?PT Short Term Goal 4 (Week 1): Pt will propel wc 213fw/R exts and moderate cueing to attend to L environment. ?PT Short Term Goal 4 - Progress (Week 1): Met ?Week 2:  PT Short Term Goal 1 (Week 2): Pt will self propel w/c with hemi technique with supervision and no cues ?PT Short Term Goal 1 - Progress (Week 2): Met ?PT Short Term Goal 2 (Week 2): Pt will initiate standing transfer training ?PT Short Term Goal 2 - Progress (Week 2): Progressing toward goal ?PT Short Term Goal 3 (Week 2): Pt will complete bed mobility with supervision ?PT Short Term Goal 3 - Progress (Week 2): Met ?Week 3:  PT Short Term Goal 1 (Week 3): Pt will ambulate with LRAD with mod A or better x 50 ft ?PT Short Term Goal 1 - Progress (Week 3): Met ?PT Short Term Goal 2 (Week 3): Pt will perform squat pivot transfer with supervision ?PT Short Term Goal 2 - Progress (Week 3): Met ?PT Short Term Goal 3 (Week 3): Pt will initiate standing transfer with LRAD ?PT Short Term Goal 3 - Progress (Week 3): Met ? ?Skilled Therapeutic Interventions/Progress Updates:  ?  pt received in w/c and agreeable to therapy, No complaint of pain.  Pt's wife participated in today's session for family education and assisting with w/c follow. Pt propelled chair with hemi technique to 4th floor  gym. Session focused on problem solving and practicing stair navigation. Pt navigated 2 x 4 6" steps with shower chair technique, with therapist and with his wife, who demoed appropriate assist and guarding. Pt with min-mod A to move LLE, but they plan on using leg loops at home. Pt then navigated 6" steps x 2 with forward ascending/backwards descending technique, and x 4 with forward ascending technique, step to pattern with hand rail on R side. Pt required min A to place foot on step. Pt then set up with anterior strut AFO to prevent knee buckling. Pt ambulates x 10 ft and then 2 x 30 ft with AFO and RW, demoing improved knee buckling with AFO. VC for hip placement during stance and min A to assist with LLE placement, demoing greatly improved ER since 3/25. Pt returned to room and pt's wife assisted with walking to chair in the same manner, demoing appropriate and safe guarding. Pt remained in recliner preparing for toileting with his wife's assistance.  ? ?Therapy Documentation ?Precautions:  ?Precautions ?Precautions: Fall ?Precaution Comments: Flaccid Lt hemi, hx scoliosis, HTN ?Restrictions ?Weight Bearing Restrictions: No ?General: ?  ? ? ? ? ?Therapy/Group: Individual Therapy ? ?OlDundalk3/27/2023, 12:55 PM  ?

## 2021-05-13 LAB — MAGNESIUM: Magnesium: 1.7 mg/dL (ref 1.7–2.4)

## 2021-05-13 MED ORDER — MAGNESIUM OXIDE -MG SUPPLEMENT 400 (240 MG) MG PO TABS
400.0000 mg | ORAL_TABLET | Freq: Every day | ORAL | Status: DC
Start: 2021-05-13 — End: 2021-05-15
  Administered 2021-05-13 – 2021-05-15 (×3): 400 mg via ORAL
  Filled 2021-05-13 (×3): qty 1

## 2021-05-13 NOTE — Progress Notes (Signed)
Patient ID: Miguel Mccullough, male   DOB: 23-May-1965, 56 y.o.   MRN: [id number] [place] with wife via telephone to discuss equipment they have what is needed from friends and borrowed equipment. Working on getting home health for him. ?

## 2021-05-13 NOTE — Progress Notes (Signed)
Speech Language Pathology Daily Session Note ? ?Patient Details  ?Name: [name] ?MRN: [id number] ?Date of Birth: 10-09-1965 ? ?Today's Date: 05/13/2021 ?SLP Individual Time: 0301-4996 ?SLP Individual Time Calculation (min): 40 min ? ?Short Term Goals: ?Week 4: SLP Short Term Goal 1 (Week 4): STG = LTG d/t ELOS ? ?Skilled Therapeutic Interventions: ? Pt seen for skilled ST with focus on cognitive goals, upright in wheelchair with wife present. SLP facilitating executive function skills during moderately complex deductive reasoning tasks given overall Supervision A cues for use of strategies as trained (note taking, thinking aloud, etc). Pt also participating in alternating attention task requiring Supervision progressing to [place] A cues due to auditory distractions impacting focus. Pt left in wheelchair with all needs within reach, cont ST POC.  ? ?Pain ?Pain Assessment ?Pain Scale: 0-10 ?Pain Score: 0-No pain ? ?Therapy/Group: Individual Therapy ? ?Miguel Mccullough ?05/13/2021, 8:42 AM ?

## 2021-05-13 NOTE — Progress Notes (Signed)
Patient ID: Miguel Mccullough, male   DOB: 1965-12-16, 56 y.o.   MRN: [id number]  Met with pt and spoke with wife via telephone to update team conference and progress this week. Aware plan still for discharge 3/30 and working on getting home health follow up. Pt and wife have gotten all of his needed equipment for home. Will place match in for prescription assistance. Work toward discharge Thursday ?

## 2021-05-13 NOTE — Progress Notes (Signed)
Occupational Therapy Session Note ? ?Patient Details  ?Name: Miguel Mccullough ?MRN: [id number] ?Date of Birth: 11-13-1965 ? ?Today's Date: 05/13/2021 ?OT Individual Time: 5859-2924 ?OT Individual Time Calculation (min): 31 min  ? ? ?Short Term Goals: ?Week 1:  OT Short Term Goal 1 (Week 1): Pt will thread Lt UE into shirt with Mod A and vcs to improve Lt attention ?OT Short Term Goal 1 - Progress (Week 1): Progressing toward goal ?OT Short Term Goal 2 (Week 1): Pt will verbalize 2 joint protection strategies to use during bed mobility and/or transfers to improve Lt attention ?OT Short Term Goal 2 - Progress (Week 1): Progressing toward goal ?OT Short Term Goal 3 (Week 1): Education will be provided to pts wife in regards to 2 hemi positioning strategies to implement at home for caregiver education ?OT Short Term Goal 3 - Progress (Week 1): Met ?Week 2:  OT Short Term Goal 1 (Week 2): Pt will verbalize 2 joint protection strategies to use during bed mobility and/or transfers to improve Lt attention ?OT Short Term Goal 1 - Progress (Week 2): Met ?OT Short Term Goal 2 (Week 2): Pt will thread Lt UE into shirt with Mod A and vcs to improve Lt attention ?OT Short Term Goal 2 - Progress (Week 2): Met ?OT Short Term Goal 3 (Week 2): Pt will perform AAROM/PROM HEP for LUE with no more than MIN verbal cues ?OT Short Term Goal 3 - Progress (Week 2): Met ?OT Short Term Goal 4 (Week 2): Pt will perform sit <> stand with LRAD with Mod A of 1 in prep for LB ADL ?OT Short Term Goal 4 - Progress (Week 2): Met ?Week 3:  OT Short Term Goal 2 (Week 3): STG=LTG 2/2 ELOS ? ?Skilled Therapeutic Interventions/Progress Updates:  ?  Pt received seated in w/c, no c/o pain, agreeable to therapy. Session focus on self-care retraining, activity tolerance, LUE NMR, transfer retraining in prep for improved ADL/IADL/func mobility performance + decreased caregiver burden. Agreeable to practice toilet transfers in prep for upcoming DC, has narrow doorway  and RW likely to not fit going forward. Pt able to complete ambulatory toilet transfer with RW + L saddle splint and overall min A, total A to progress LLE. Practiced side-stepping up and down bed at same assist level, although anticipate that pt's bathroom access will be limited if wife + pt + RW unable to fit in narrow space. Discussed purchase of offset hinges to widen width of door.  ? ?Pt completed 2x15 of modified push-ups in stance with good technique, estimate LUE at Brummstrum level II, hand at level II, as well. Provided stroke support group flyer and webite for guided visual imagery practice to assist in motor return of hemiplegic body. ? ?Pt left seated in recliner, call bell in reach, and all immediate needs met.  ? ? ?Therapy Documentation ?Precautions:  ?Precautions ?Precautions: Fall ?Precaution Comments: Flaccid Lt hemi, hx scoliosis, HTN ?Restrictions ?Weight Bearing Restrictions: No ? ?Pain: denies ?  ?ADL: See Care Tool for more details. ? ?Therapy/Group: Individual Therapy ? ?Volanda Napoleon MS, OTR/L ? ?05/13/2021, 6:53 AM ?

## 2021-05-13 NOTE — Progress Notes (Signed)
Physical Therapy Session Note ? ?Patient Details  ?Name: Miguel Mccullough ?MRN: [id number] ?Date of Birth: 1965-08-28 ? ?Today's Date: 05/13/2021 ?PT Individual Time: 1601-0932 ?PT Individual Time Calculation (min): 78 min  ? ?Short Term Goals: ?Week 1:  PT Short Term Goal 1 (Week 1): Pt will perform bed to wc transfers to R w/min assist and light cues, to L w/mod to max assist of 1 ?PT Short Term Goal 1 - Progress (Week 1): Met ?PT Short Term Goal 2 (Week 1): Pt will sit statically w/feet supported w/cga ?PT Short Term Goal 2 - Progress (Week 1): Met ?PT Short Term Goal 3 (Week 1): Pt will ambulate up to 52f w/LRAD and 2 person assist ?PT Short Term Goal 3 - Progress (Week 1): Met ?PT Short Term Goal 4 (Week 1): Pt will propel wc 282fw/R exts and moderate cueing to attend to L environment. ?PT Short Term Goal 4 - Progress (Week 1): Met ?Week 2:  PT Short Term Goal 1 (Week 2): Pt will self propel w/c with hemi technique with supervision and no cues ?PT Short Term Goal 1 - Progress (Week 2): Met ?PT Short Term Goal 2 (Week 2): Pt will initiate standing transfer training ?PT Short Term Goal 2 - Progress (Week 2): Progressing toward goal ?PT Short Term Goal 3 (Week 2): Pt will complete bed mobility with supervision ?PT Short Term Goal 3 - Progress (Week 2): Met ?Week 3:  PT Short Term Goal 1 (Week 3): Pt will ambulate with LRAD with mod A or better x 50 ft ?PT Short Term Goal 1 - Progress (Week 3): Met ?PT Short Term Goal 2 (Week 3): Pt will perform squat pivot transfer with supervision ?PT Short Term Goal 2 - Progress (Week 3): Met ?PT Short Term Goal 3 (Week 3): Pt will initiate standing transfer with LRAD ?PT Short Term Goal 3 - Progress (Week 3): Met ? ?Skilled Therapeutic Interventions/Progress Updates:  ?  Pt seated in w/c on arrival and agreeable to therapy. No complaint of pain. Pt propelled w/c throughout session, mod I, with assist only for L leg rest. Session focused on gait training and family education. Pt  performed car transfer with RW and with HHA modified stand pivot at vehicle heights approximating both of pt's vehicles. Pt's wife assisted with transfer and provided appropriate guarding and assist where needed. Extended time for problem solving and education. ? ?Transitioned to gait training, pt ambulated 4 x 100 ft with RW and +2 assist, mod A to initiate swing and place foot. With repetition, pt demoed improving upright posture, limb advancement, stance stability, RLE step length, and cadence. Upon return to room, pt's wife assisted with in room gait to recliner. Assessed pt's skin under AFO, noted no redness or irritation. Pt remained in recliner with all needs in reach.  ? ?Therapy Documentation ?Precautions:  ?Precautions ?Precautions: Fall ?Precaution Comments: Flaccid Lt hemi, hx scoliosis, HTN ?Restrictions ?Weight Bearing Restrictions: No ?General: ?  ? ? ? ?Therapy/Group: Individual Therapy ? ?OlTerry3/28/2023, 3:47 PM  ?

## 2021-05-13 NOTE — Patient Care Conference (Signed)
Inpatient RehabilitationTeam Conference and Plan of Care Update ?Date: 05/13/2021   Time: 11:39 AM  ? ? ?Patient Name: Miguel Mccullough      ?Medical Record Number: [id number]  ?Date of Birth: [birth year] ?Sex: Male         ?Room/Bed: 5C07C/5C07C-01 ?Payor Info: Payor: /   ? ?Admit Date/Time:  04/17/2021  5:15 PM ? ?Primary Diagnosis:  ICH (intracerebral hemorrhage) ([place]) ? ?Hospital Problems: Principal Problem: ?  ICH (intracerebral hemorrhage) ([place]) ? ? ? ?Expected Discharge Date: Expected Discharge Date: 05/15/21 ? ?Team Members Present: ?Physician leading conference: Dr. Courtney Heys ?Social Worker Present: Ovidio Kin, LCSW ?Nurse Present: Dorthula Nettles, RN ?PT Present: Ailene Rud, PT ?OT Present: Willeen Cass, OT ?SLP Present: Lillie Columbia, SLP ?PPS Coordinator present : Ileana Ladd, PT ? ?   Current Status/Progress Goal Weekly Team Focus  ?Bowel/Bladder ? ? Continent of both B/B. LBM 3/26  remain continent  toilet as needed   ?Swallow/Nutrition/ Hydration ? ?           ?ADL's ? ? per wife report-min/mod A overall for bathing/LB dressing; stand pivot transfers with min A and max verbal cues for sequncing/safety; squat pivot tranfsers with CGA  Min A overall  functional transfers, sitting/standing balance, patient/family ed   ?Mobility ? ? CGA squat pivot, min A short distance gait, min A stairs with shower chair  min A, supervision w/c  gait training, family education, steps   ?Communication ? ? mod I  mod I - goal met      ?Safety/Cognition/ Behavioral Observations ? supervision A, improved carryover of strategies in executive function tasks  Mod I - Supervision A  education, executive function, memory and alternating attention   ?Pain ? ? Rarely ask for pain medication. Has prn medication  pain < 3 on a 0-10 pain scale  assess pain q 4 hr and prn   ?Skin ? ? Skin intact  no new breakdown  assess skin q shift and prn   ? ? ?Discharge Planning:  ?Wife is here daily and participating in therapies, feels  ready for discharge on Thursday working on DME and trying to get charity home health   ?Team Discussion: ?Spasticity getting worse. Requesting left AFO consult. Continent B/B, no reported pain. Patient has no insurance, requested to get insurance. Working on [employer]. Appointment with [facility] scheduled 05/20/2021. Will need renal follow-up. Recommending installation of ramp. ? ?Patient on target to meet rehab goals: ?yes, min assist goals. Wife assisting with ADL's. Min assist with mobility. Met all SLP goals. Family education on-going. ? ?*See Care Plan and progress notes for long and short-term goals.  ? ?Revisions to Treatment Plan:  ?Adjusting medications, finalizing discharge plans. ?  ?Teaching Needs: ?Family education, medication management, transfer/gait training, etc. ?  ?Current Barriers to Discharge: ?Uninsured ? ?Possible Resolutions to Barriers: ?Family education ?Follow-up therapy ?Follow-up renal ?  ? ? Medical Summary ?Current Status: continent- B/B- no pain- spasticity still an issue- on Baclofen- ? Barriers to Discharge: Home enviroment access/layout;Medical stability;Medication compliance;Other (comments) ? Barriers to Discharge Comments: BP still an issue- getting AFO consult Wednesday- ?Possible Resolutions to [employer]: mainly min A- hypokalemia- focus on dietary sources- Mg low- started repletion- AFO consult Wed- deliver beofre d/c- will need Botox and renal outpt. Loyce Dys I SLP to Supervision- d/c 3/30 ? ? ?Continued Need for Acute Rehabilitation Level of Care: The patient requires daily medical management by a physician with specialized training in physical medicine  and rehabilitation for the following reasons: ?Direction of a multidisciplinary physical rehabilitation program to maximize functional independence : Yes ?Medical management of patient stability for increased activity during participation in an intensive rehabilitation regime.: Yes ?Analysis of  laboratory values and/or radiology reports with any subsequent need for medication adjustment and/or medical intervention. : Yes ? ? ?I attest that I was present, lead the team conference, and concur with the assessment and plan of the team. ? ? ?Dorthula Nettles G ?05/13/2021, 4:56 PM  ? ? ? ? ? ? ?

## 2021-05-13 NOTE — Progress Notes (Signed)
?                                                       PROGRESS NOTE ? ? ?Subjective/Complaints: ? ?Wearing AFO to wear-  ?No issues.  ?Wearing AFO from here. Will likely need AFO.  ? ? ? ?ROS:  ? ?Pt denies SOB, abd pain, CP, N/V/C/D, and vision changes ? ?Objective: ?  ?No results found. ?Recent Labs  ?  05/12/21 ?0613  ?WBC 7.6  ?HGB 11.4*  ?HCT 33.9*  ?PLT 210  ? ? ? ? ?Recent Labs  ?  05/12/21 ?0613  ?NA 142  ?K 3.2*  ?CL 106  ?CO2 28  ?GLUCOSE 84  ?BUN 17  ?CREATININE 1.86*  ?CALCIUM 8.9  ? ? ? ? ? ?Intake/Output Summary (Last 24 hours) at 05/13/2021 0808 ?Last data filed at 05/12/2021 1912 ?Gross per 24 hour  ?Intake 1080 ml  ?Output --  ?Net 1080 ml  ?  ? ?  ? ?Physical Exam: ?Vital Signs ?Blood pressure 138/87, pulse 62, temperature 98.4 ?F (36.9 ?C), temperature source Oral, resp. rate 14, height 5\' 4"  (1.626 m), weight 76.2 kg, SpO2 95 %. ? ? ? ?General: awake, alert, appropriate, sitting up in bedside chair; wearing CIR AFO- wife at bedside;  NAD ?HENT: conjugate gaze; oropharynx moist ?CV: regular rate; no JVD ?Pulmonary: CTA B/L; no W/R/R- good air movement ?GI: soft, NT, ND, (+)BS ?Psychiatric: appropriate ?Neurological: Ox3- MAS of 2 in LUE- 1+ to 2 in L elbow ? ?Musculoskeletal:  ?   Cervical back: Neck supple. No tenderness.  ? 0/5 on L side still ?Skin: ?   General: Skin is warm and dry.  ?Neurological:  ?   Mental Status: He is alert and oriented to person, place, and time.  ?   Comments: Mild left facial weakness with left inattention and mild left hemifacial sensory loss.  Speech clear and able to follow commands without difficulty. Dense left hemiplegia with sensory deficits. LUE /LLE 0/5 except for trace at foot. RUE and RLE 5/5. MAS 1+ Left shoulder/elbow ?  ?  ? ?Assessment/Plan: ?1. Functional deficits which require 3+ hours per day of interdisciplinary therapy in a comprehensive inpatient rehab setting. ?Physiatrist is providing close team supervision and 24 hour management of active  medical problems listed below. ?Physiatrist and rehab team continue to assess barriers to discharge/monitor patient progress toward functional and medical goals ? ?Care Tool: ? ?Bathing ?   ?Body parts bathed by patient: Chest, Abdomen, Front perineal area, Right upper leg, Left upper leg, Face, Left arm, Right lower leg, Left lower leg  ? Body parts bathed by helper: Buttocks, Right arm, Right lower leg, Left lower leg ?  ?  ?Bathing assist Assist Level: Moderate Assistance - Patient 50 - 74% (Per family report; wife prefers to assist patient with bathing) ?  ?  ?Upper Body Dressing/Undressing ?Upper body dressing   ?What is the patient wearing?: Pull over shirt ?   ?Upper body assist Assist Level: Minimal Assistance - Patient > 75% ?   ?Lower Body Dressing/Undressing ?Lower body dressing ? ? ?   ?What is the patient wearing?: Pants, Underwear/pull up ? ?  ? ?Lower body assist Assist for lower body dressing: Moderate Assistance - Patient 50 - 74% ?   ? ?Toileting ?Toileting Toileting Activity did  not occur (Clothing management and hygiene only): N/A (no void or bm)  ?Toileting assist Assist for toileting: Moderate Assistance - Patient 50 - 74% ?  ?  ?Transfers ?Chair/bed transfer ? ?Transfers assist ?   ? ?Chair/bed transfer assist level: Minimal Assistance - Patient > 75% ?  ?  ?Locomotion ?Ambulation ? ? ?Ambulation assist ? ?   ? ?Assist level: Moderate Assistance - Patient 50 - 74% ?Assistive device: Walker-rolling ?Max distance: 8  ? ?Walk 10 feet activity ? ? ?Assist ? Walk 10 feet activity did not occur: Safety/medical concerns ? ?Assist level: Minimal Assistance - Patient > 75% ?Assistive device: Other (comment) (hall rail)  ? ?Walk 50 feet activity ? ? ?Assist Walk 50 feet with 2 turns activity did not occur: Safety/medical concerns ? ?Assist level: 2 helpers ?Assistive device: Lite Gait  ? ? ?Walk 150 feet activity ? ? ?Assist Walk 150 feet activity did not occur: Safety/medical concerns ? ?Assist level:  2 helpers ?Assistive device: Lite Gait ?  ? ?Walk 10 feet on uneven surface  ?activity ? ? ?Assist Walk 10 feet on uneven surfaces activity did not occur: Safety/medical concerns ? ? ?  ?   ? ?Wheelchair ? ? ? ? ?Assist Is the patient using a wheelchair?: Yes ?Type of Wheelchair: Manual ?  ? ?Wheelchair assist level: Supervision/Verbal cueing ?Max wheelchair distance: 90  ? ? ?Wheelchair 50 feet with 2 turns activity ? ? ? ?Assist ? ?  ?  ? ? ?Assist Level: Supervision/Verbal cueing  ? ?Wheelchair 150 feet activity  ? ? ? ?Assist ?   ? ? ?Assist Level: Supervision/Verbal cueing  ? ?Blood pressure 138/87, pulse 62, temperature 98.4 ?F (36.9 ?C), temperature source Oral, resp. rate 14, height 5\' 4"  (1.626 m), weight 76.2 kg, SpO2 95 %. ? ?Medical Problem List and Plan: ?1. 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            -patient may  shower ?            -ELOS/Goals: 14-20 days- min A to supervision ? D/c date 3/30 ? Continue CIR- PT, OT and SLP ? Team conference today to finalize d/c plans-  ?2.  Antithrombotics: ?-DVT/anticoagulation:  lovenox ? -dopplers reviewed and negative ?            -antiplatelet therapy: N/A due to bleed.  ?3. Pain Management:: On tylenol as needed ?4. Mood: LCSW to follow for evaluation an dsup ?            -antipsychotic agents: N/A ?5. Neuropsych: This patient is capable of making decisions on his own behalf. ?6. Skin/Wound Care: Routine pressure relief measures.  ?7. Fluids/Electrolytes/Nutrition: Monitor I/O. Check CMET in am.  ?8. HTN: goal to keep SBP< 160 ? -3/5 bp remains poorly controlled ?            -- Catapress 2 mg TID, metoprolol increased to 75 mg BID and Norvasc 10 qd. ?  -will begin scheduled hydralazine 50mg  q8 ? -will use addnl hydralazine prn  ? On Norvasc; Hydralazine 100 mg q8 hours and Clonidine patch 0.3 and metoprolol 75 mg BID ?Vitals:  ? 05/12/21 2024 05/13/21 4008  ?BP: (!) 141/88 138/87  ?Pulse: (!) 58 62  ?Resp:  16 14  ?Temp: 97.6 ?F (36.4 ?C) 98.4 ?F (36.9 ?C)  ?SpO2: 99% 95%  ? ?3/28- borderline but maxxed out on multiple meds- pt doesn't want diuretic if possible-  suggest getting BP cuff at home and checking BP 1-2x/day and can send home on prn Hydralazine to take if need be- PCP can assess if needs other meds ?9. Persistent hypokalemia: Start Kdur 20 meq daily. Check Mg level ? 3/3- Will replete 40 mEq x2 and recheck Monday- last Mg on 2/28 was 2.3 ? 3/6- K+ up to 3.6- con't to monitor ? 3/8- labs in AM ? 3/9- Mg 1.8 and K+ 3.5- will con't to monitor ? 3/12- labs Monday  ? 3/14- K+ 3.7- con't to monitor ? 3/16- K+ 3.5- still in normal range, although low- will ask pt to eat bananas, etc ? 3/27- K+ 3.2 today- repleted- will recheck before d/c. Also recheck Mg level ? 3/28 Mg 1.7- will replete  MgOx 400 mg daily- and recheck BMP in AM for K+ since repleted.  ?10.  Severe LVH: Has been referred to cardiology for work up. Continue metoprolol bid.  ?11. 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  ?--Renal ultrasound with mild evidence of medical renal disease ?            --encourage fluid intake. ?3/3- down slightly to 2.21- will avoid nephrotoxic agents ?3/9- Renal U/S normal- Cr up slightly to 1.99 and BUN 25- think it's leveling out- will con't to monitor ?3/13- Cr up slightly more to 2.08- and BUN 23- will recheck in Am and push fluids- also, might have infection/UTI which could affect Cr slightly? ? 3/14- U/A (-) and Cr up again to 2.12 and BUN 23- has (+) tenting- asked pt to drink 6-8 cups/day and will recheck Thursday ?3/16- Cr up to 1.95 and BUN 21- doing much better when drinks- con't regimen ?  ? ?  Latest Ref Rng & Units 05/12/2021  ?  6:13 AM 05/05/2021  ?  8:22 AM 05/01/2021  ?  5:24 AM  ?BMP  ?Glucose 70 - 99 mg/dL 84   153   84    ?BUN 6 - 20 mg/dL 17   17   21     ?Creatinine 0.61 - 1.24 mg/dL 1.86   2.07   1.95    ?Sodium 135 - 145 mmol/L 142   139   139    ?Potassium  3.5 - 5.1 mmol/L 3.2   3.5   3.5    ?Chloride 98 - 111 mmol/L 106   105   106    ?CO2 22 - 32 mmol/L 28   23   26     ?Calcium 8.9 - 10.3 mg/dL 8.9   9.2   8.9    ?  3/27- Cr better at 1.86- con't to [facility]

## 2021-05-13 NOTE — Progress Notes (Signed)
Occupational Therapy Session Note ? ?Patient Details  ?Name: Miguel Mccullough ?MRN: [id number] ?Date of Birth: [birth year] ? ?Today's Date: 05/13/2021 ?OT Individual Time: 0900-1002 ?OT Individual Time Calculation (min): 62 min  ? ? ?Short Term Goals: ?Week 1:  OT Short Term Goal 1 (Week 1): Pt will thread Lt UE into shirt with Mod A and vcs to improve Lt attention ?OT Short Term Goal 1 - Progress (Week 1): Progressing toward goal ?OT Short Term Goal 2 (Week 1): Pt will verbalize 2 joint protection strategies to use during bed mobility and/or transfers to improve Lt attention ?OT Short Term Goal 2 - Progress (Week 1): Progressing toward goal ?OT Short Term Goal 3 (Week 1): Education will be provided to pts wife in regards to 2 hemi positioning strategies to implement at home for caregiver education ?OT Short Term Goal 3 - Progress (Week 1): Met ?Week 2:  OT Short Term Goal 1 (Week 2): Pt will verbalize 2 joint protection strategies to use during bed mobility and/or transfers to improve Lt attention ?OT Short Term Goal 1 - Progress (Week 2): Met ?OT Short Term Goal 2 (Week 2): Pt will thread Lt UE into shirt with Mod A and vcs to improve Lt attention ?OT Short Term Goal 2 - Progress (Week 2): Met ?OT Short Term Goal 3 (Week 2): Pt will perform AAROM/PROM HEP for LUE with no more than MIN verbal cues ?OT Short Term Goal 3 - Progress (Week 2): Met ?OT Short Term Goal 4 (Week 2): Pt will perform sit <> stand with LRAD with Mod A of 1 in prep for LB ADL ?OT Short Term Goal 4 - Progress (Week 2): Met ? ?Skilled Therapeutic Interventions/Progress Updates:  ?  Patient seen this A.M. patient seated upright in w/c, pt completed simulated task in bathing UB/LB, the pt complete the UB with MinA and minimal vc's he required MaxA for LB. The pt was able to complete a simulated task in UB dressing using medium resistant theraband requiring 1 initial cue for donning the theraband over head to waist level.  The pt required ModA for donning  the theraband onto his LE a w/c LOF coming from sit to stand using the hemi walker.  The pt was able to come from sit to stand with ModA and maintain static standing balance 3X with a count of 10 for maintaining position.  The pt completed resistive exercise applying external force to improve upon his dynamic sit balance and trunk strength.  The pt completed the session with UB exercises for bicep curls , shld flexion, and horizontal abduction 2 sets of 20 using a 3lb dumb bell with rest breaks as needed, the pt required 2 rest breaks and was encouraged to incorporate relaxation breathing for greater compliance.  The pt remained at w/c LOF with his alarm in place, his call light, and bedside table near by. The pt had no c/o pain this treatment session.  ? ?Therapy Documentation ?Precautions:  ?Precautions ?Precautions: Fall ?Precaution Comments: Flaccid Lt hemi, hx scoliosis, HTN ?Restrictions ?Weight Bearing Restrictions: No ? ? ?Therapy/Group: Individual Therapy ? ?Yvonne Kendall ?05/13/2021, 12:20 PM ?

## 2021-05-14 LAB — BASIC METABOLIC PANEL
Anion gap: 9 (ref 5–15)
BUN: 20 mg/dL (ref 6–20)
CO2: 27 mmol/L (ref 22–32)
Calcium: 9 mg/dL (ref 8.9–10.3)
Chloride: 105 mmol/L (ref 98–111)
Creatinine, Ser: 2 mg/dL — ABNORMAL HIGH (ref 0.61–1.24)
GFR, Estimated: 39 mL/min — ABNORMAL LOW (ref >60)
Glucose, Bld: 83 mg/dL (ref 70–99)
Potassium: 3.9 mmol/L (ref 3.5–5.1)
Sodium: 141 mmol/L (ref 135–145)

## 2021-05-14 NOTE — Discharge Instructions (Addendum)
Inpatient Rehab Discharge Instructions ? ?Edison Nasuti ?Discharge date and time: 05/15/21  ? ?Activities/Precautions/ Functional Status: ?Activity: no lifting, driving, or strenuous exercise till cleared by MD ?Diet: cardiac diet ?Wound Care: none needed ? ?Functional status:  ?___ No restrictions     ___ Walk up steps independently ?_X__ 24/7 supervision/assistance   ___ Walk up steps with assistance ?___ Intermittent supervision/assistance  ___ Bathe/dress independently ?___ Walk with walker     _X__ Bathe/dress with assistance ?___ Walk Independently    ___ Shower independently ?___ Walk with assistance    ___ Shower with assistance ?_X__ No alcohol     ___ Return to work/school ________ ? ? ?Special Instructions: ? ? ? ? ?COMMUNITY REFERRALS UPON DISCHARGE:   ? ?Home Health:   PT & SP  ?               Agency: [facility]   Phone: [phone] ? ?[place]  [phone] ?                                    [facility] [phone] ? ? ?Medical Equipment/Items Ordered:FAMILY GOT EQUIPMENT ON OWN-WHEELCHAIR DROP-ARM BEDSIDE COMMODE AND ROLLING WALKER ?                                                Agency/Supplier:NA ? ?MATCH FOR ASSISTANCE WITH PRESCRIPTION ASSISTANCE ? ? ?STROKE/TIA DISCHARGE INSTRUCTIONS ?SMOKING Cigarette smoking nearly doubles your risk of having a stroke & is the single most alterable risk factor  ?If you smoke or have smoked in the last 12 months, you are advised to quit smoking for your health. Most of the excess cardiovascular risk related to smoking disappears within a year of stopping. ?Ask you doctor about anti-smoking medications ?[place] Quit Line: 1-800-QUIT NOW ?Free Smoking Cessation Classes [phone]  ?CHOLESTEROL Know your levels; limit fat & cholesterol in your diet  ?Lipid Panel  ?   ?Component Value Date/Time  ? CHOL 203 (H) 04/13/2021 2247  ? TRIG 78 04/16/2021 0750  ? HDL 59 04/13/2021 2247  ? CHOLHDL 3.4 04/13/2021 2247  ? VLDL 50 (H)  04/13/2021 2247  ? [place] 94 04/13/2021 2247  ? ? ? Many patients benefit from treatment even if their cholesterol is at goal. ?Goal: Total Cholesterol (CHOL) less than 160 ?Goal:  Triglycerides (TRIG) less than 150 ?Goal:  HDL greater than 40 ?Goal:  LDL (LDLCALC) less than 100 ?  ?BLOOD PRESSURE American Stroke Association blood pressure target is less that 120/80 mm/Hg  ?Your discharge blood pressure is:  BP: 133/74 Monitor your blood pressure ?Limit your salt and alcohol intake ?Many individuals will require more than one medication for high blood pressure  ?DIABETES (A1c is a blood sugar average for last 3 months) Goal HGBA1c is under 7% (HBGA1c is blood sugar average for last 3 months)  ?Diabetes: ?No known diagnosis of diabetes   ? ?Lab Results  ?Component Value Date  ? HGBA1C 4.7 (L) 04/13/2021  ? ? Your HGBA1c can be lowered with medications, healthy diet, and exercise. ?Check your blood sugar as directed by your physician ?Call your physician if you experience unexplained or low blood sugars.  ?PHYSICAL ACTIVITY/REHABILITATION Goal is 30 minutes at least 4 days per week  ?  Activity: No driving, ?Therapies: see above ?Return to work: To be decided on follow up Activity decreases your risk of heart attack and stroke and makes your heart stronger.  It helps control your weight and blood pressure; helps you relax and can improve your mood. ?Participate in a regular exercise program. ?Talk with your doctor about the best form of exercise for you (dancing, walking, swimming, cycling).  ?DIET/WEIGHT Goal is to maintain a healthy weight  ?Your discharge diet is:  ?Diet Order   ? ?       ?  Diet Heart Room service appropriate? Yes; Fluid consistency: Thin  Diet effective now       ?  ? ?  ?  ? ?  ?  liquids ?Your height is:  Height: 5\' 4"  (162.6 cm) ?Your current weight is: Weight: 76.2 kg ?Your Body Mass Index (BMI) is:  BMI (Calculated): 28.82 Following the type of diet specifically designed for you will help  prevent another stroke. ?Your goal weight is    ?Your goal Body Mass Index (BMI) is 19-24. ?Healthy food habits can help reduce 3 risk factors for stroke:  High cholesterol, hypertension, and excess weight.  ?RESOURCES Stroke/Support Group:  Call [phone] ?  ?STROKE EDUCATION PROVIDED/REVIEWED AND GIVEN TO PATIENT Stroke warning signs and symptoms ?How to activate emergency medical system (call 911). ?Medications prescribed at discharge. ?Need for follow-up after discharge. ?Personal risk factors for stroke. ?Pneumonia vaccine given:  ?Flu vaccine given:  ?My questions have been answered, the writing is legible, and I understand these instructions.  I will adhere to these goals & educational materials that have been provided to me after my discharge from the hospital.  ? ?  ? ?My questions have been answered and I understand these instructions. I will adhere to these goals and the provided educational materials after my discharge from the hospital. ? ?Patient/Caregiver Signature _______________________________ Date __________ ? ?Clinician Signature _______________________________________ Date __________ ? ?Please bring this form and your medication list with you to all your follow-up doctor's appointments.   ?

## 2021-05-14 NOTE — Progress Notes (Signed)
Speech Language Pathology Discharge Summary ? ?Patient Details  ?Name: Miguel Mccullough ?MRN: [id number] ?Date of Birth: 05-07-1965 ? ?Today's Date: 05/14/2021 ?SLP Individual Time: 9449-6759 ?SLP Individual Time Calculation (min): 43 min ? ? ?Skilled Therapeutic Interventions:  Skilled ST services focused on education and cognitive skills. SLP provided education to pt and pt's wife pertaining to strategies and cuing in executive function tasks, alternating attention and memory. All questions answered to satisfaction. SLP facilitated complex problem solving and memory strategies in deductive reasoning puzzle, pt required supervision A verbal cues.  Pt demonstrated good carryover of strategies, in note taking and organization. Pt was left in room with call bell within reach and chair alarm set.  ? ?Patient has met 5 of 5 long term goals.  Patient to discharge at overall Modified Independent;Supervision level.  ?Reasons goals not met:    ? ?Clinical Impression/Discharge Summary:   Pt made great progress meeting 5 out 5 goals, discharging at supervision A- Mod I. Pt demonstrated improvements in utilizing swallow strategies tolerating regular textures and thin liquids, as well as speech strategies in conversation when fatigued. Pt demonstrated improvements in memory with use of internal/external aids. Pt demonstrated improvements in executive function skills with organizing, planning and executing plan in complex task including medication/time/money management and reasoning tasks. Education was on going and completed with pt's wife. Pt benefited from skilled ST services in order to maximize functional independence and reduce burden of care requiring intermittent supervision A (for a cognitive perspective) and continued ST services.  ? ?Care Partner:  ?Caregiver Able to Provide Assistance: Yes  ?Type of Caregiver Assistance: Physical;Cognitive ? ?Recommendation:  ?Outpatient SLP;Home Health SLP;24 hour supervision/assistance   ?Rationale for SLP Follow Up: Maximize cognitive function and independence;Maximize functional communication;Reduce caregiver burden  ? ?Equipment: N/A  ? ?Reasons for discharge: Discharged from hospital  ? ?Patient/Family Agrees with Progress Made and Goals Achieved: Yes  ? ? ?[name] ?05/14/2021, 9:54 AM ? ?

## 2021-05-14 NOTE — Progress Notes (Signed)
Inpatient Rehabilitation Care Coordinator ?Discharge Note  ? ?Patient Details  ?Name: Miguel Mccullough ?MRN: [id number] ?Date of Birth: November 09, 1965 ? ? ?Discharge location: [place] ? ?Length of Stay: 27 DAYS ? ?Discharge activity level: MIN LEVEL OF ASSIST ? ?Home/community participation: ACTIVE ? ?Patient response AS:TMHDQQ Literacy - How often do you need to have someone help you when you read instructions, pamphlets, or other written material from your doctor or pharmacy?: Never ? ?Patient response IW:LNLGXQ Isolation - How often do you feel lonely or isolated from those around you?: Never ? ?Services provided included: MD, RD, PT, OT, SLP, RN, CM, TR, Pharmacy, Neuropsych, SW ? ?Financial Services:  ?Charity fundraiser Utilized: Other (Comment) (SELF PAY) ?  ? ?Choices offered to/list presented to: PT AND WIFE ? ?Follow-up services arranged:  ?Home Health, Patient/Family has no preference for HH/DME agencies ?Home Health Agency: [facility]  ?RECEIVED ALL EQUIPMENT FROM FAMILY AND FRIENDS-WHEELCHAIR, ROLLING WALKER AND DROP-ARM BEDSIDE COMMODE. ?MATCH GIVEN FOR PRESCRIPTION ASSISTANCE ?[place] INFO GIVEN TO WIFE.  ?PCP FOLLOW UP APPOINTMENT 4/4 AT [facility] @ 2:30 PM ?  ?  ?  ? ?Patient response to transportation need: ?Is the patient able to respond to transportation needs?: Yes ?In the past 12 months, has lack of transportation kept you from medical appointments or from getting medications?: No ?In the past 12 months, has lack of transportation kept you from meetings, work, or from getting things needed for daily living?: No ? ? ? ?Comments (or additional information):PT DID WELL AND REACHED GOALS. WIFE WAS HERE DAILY AND PARTICIPATED IN THERAPIES WHEN APPROPRIATE. COMFORTABLE WITH HUSBAND'S CARE NEEDS.  ? ?Patient/Family verbalized understanding of follow-up arrangements:  Yes ? ?Individual  responsible for coordination of the follow-up plan: STEPHANIE-WIFE [phone] ? ?Confirmed correct DME delivered: Miguel Mccullough 05/14/2021   ? ?Miguel Mccullough ?

## 2021-05-14 NOTE — Progress Notes (Signed)
Occupational Therapy Session Note ? ?Patient Details  ?Name: Miguel Mccullough ?MRN: [id number] ?Date of Birth: 1965-04-26 ? ?Today's Date: 05/14/2021 ?OT Individual Time: 5208-0223 ?OT Individual Time Calculation (min): 60 min  ? ? ?Short Term Goals: ?Week 1:  OT Short Term Goal 1 (Week 1): Pt will thread Lt UE into shirt with Mod A and vcs to improve Lt attention ?OT Short Term Goal 1 - Progress (Week 1): Progressing toward goal ?OT Short Term Goal 2 (Week 1): Pt will verbalize 2 joint protection strategies to use during bed mobility and/or transfers to improve Lt attention ?OT Short Term Goal 2 - Progress (Week 1): Progressing toward goal ?OT Short Term Goal 3 (Week 1): Education will be provided to pts wife in regards to 2 hemi positioning strategies to implement at home for caregiver education ?OT Short Term Goal 3 - Progress (Week 1): Met ?Week 2:  OT Short Term Goal 1 (Week 2): Pt will verbalize 2 joint protection strategies to use during bed mobility and/or transfers to improve Lt attention ?OT Short Term Goal 1 - Progress (Week 2): Met ?OT Short Term Goal 2 (Week 2): Pt will thread Lt UE into shirt with Mod A and vcs to improve Lt attention ?OT Short Term Goal 2 - Progress (Week 2): Met ?OT Short Term Goal 3 (Week 2): Pt will perform AAROM/PROM HEP for LUE with no more than MIN verbal cues ?OT Short Term Goal 3 - Progress (Week 2): Met ?OT Short Term Goal 4 (Week 2): Pt will perform sit <> stand with LRAD with Mod A of 1 in prep for LB ADL ?OT Short Term Goal 4 - Progress (Week 2): Met ? ?Skilled Therapeutic Interventions/Progress Updates:  ?  Patient up in w/c upon arrival. Patient tolerated AROM/PROM on Lshld to improve the quality of his movement, the pt was able to come from sit to stand, with hand placement in the hemi RW and focus on maintaining placement.  The pt encouraged to incorporate relaxation breathing throughout the process with employing additional areas of sensory input to improve performance. The  pt completed 3 sit to stands with focus on the quality of his movements.  The pt went on to complete resistive exercises using the LUE beginning at the point of attachment more distal to improve communication for more purposeful movement. The pt went on to complete UB exercise for bicep curl, horizontal abduction, and shld flexion 2 sets of 20 with rest breaks as needed.  The pt required 2 rest breaks. The pt was instructed on good placement of the LUE/LLE to reduce his incidence for edema and resulting pain.  The pt complete resistive trunk exercise to improve his trunk control for improvements with his anatomical positioning during BADL performance.  The pt was seated in the recliner at the end of the session with his LUE positioned on a pillow, his bed side table and call light within reach , and his alarm activated.  All addition needs were address with no pain response at the time of treatment.   ? ?Therapy Documentation ?Precautions:  ?Precautions ?Precautions: Fall ?Precaution Comments: Flaccid Lt hemi, hx scoliosis, HTN ?Restrictions ?Weight Bearing Restrictions: No ?  ? ?Therapy/Group: Individual Therapy ? ?Yvonne Kendall ?05/14/2021, 12:49 PM ?

## 2021-05-14 NOTE — Progress Notes (Signed)
Physical Therapy Discharge Summary ? ?Patient Details  ?Name: Miguel Mccullough ?MRN: [id number] ?Date of Birth: 1965/10/01 ? ?Today's Date: 05/14/2021 ?PT Individual Time: 9509-3267 ?PT Individual Time Calculation (min): 90 min  ? ? ?Patient has met 11 of 11 long term goals due to improved activity tolerance, improved balance, improved postural control, increased strength, ability to compensate for deficits, functional use of  left upper extremity and left lower extremity, and improved attention.  Patient to discharge at a wheelchair level Supervision.   Patient's care partner is independent to provide the necessary physical assistance at discharge. Pt to d/c home with his wife and adult daughter. Pt performs squat pivot transfers with close supervision and stand pivot/ambulatory transfer with min A for LLE and AD management. Pt navigates stairs with shower chair method with min A and with step to pattern with mod A for LLE management and balance. Gait with RW and AFO up to 100 ft, min A for LLE placement and knee flexion. Pt's wife present for extensive family education on several occasions and demoes appropriate guarding/assist with good body mechanics.  ? ?Reasons goals not met: NA ? ?Recommendation:  ?Patient will benefit from ongoing skilled PT services in outpatient setting to continue to advance safe functional mobility, address ongoing impairments in gait, strength, tone, transfers, and minimize fall risk. ? ?Equipment: ?No equipment provided ? ?Reasons for discharge: treatment goals met and discharge from hospital ? ?Patient/family agrees with progress made and goals achieved: Yes ? ?Skilled Therapeutic Interventions/Progress Updates:  ?Pt seated in w/c on arrival and agreeable to therapy. Pt's wife and daughter present throughout session for continued family education and provided >90% of care with supervision of therapist. Both provided appropriate guarding and assist throughout session, with occasional cueing  for technique, hand placement, body mechanics.  ? ?Pt propelled w/c with hemi technique throughout session, demoing very mild L inattention, worse with distracting environments/more people to talk to. Pt able to manage all w/c parts except leg rest with use of brake extenders. ? ?Gait training: ?Pt ambulated x 10 ft and x 30 ft with AFO and RW, min A for LLE advancement. Glynis Smiles from [facility] present at this time for AFO consult. New mild hyperextension noted this PM. ?Pt also ambulated x 50 ft with ASO and gripper socks instead of shoes, demoing increased external rotation and difficulty with stability, Max a for LLE placement and stability.  ? ?Stair navigation: ?Pt navigated 6" steps x 4 with min A using shower chair technique so daughter could practice technique.  ? ?Car transfer: ?Pt performed car transfer x 2 into van and midsize height vehicle with assist from both family members. Pt using modified stand pivot into both vehicles with assist to scoot back and manage LLE.  ? ?Pt returned to room and to recliner with squat pivot transfer. Pt and family provided information on pro bono PT clinic options and stated they had no further questions for this therapist. Pt remained in recliner with his family present.  ? ?PT Discharge ?Precautions/Restrictions ?Precautions ?Precautions: Fall ?Precaution Comments: Flaccid Lt hemi, hx scoliosis, HTN ?Restrictions ?Weight Bearing Restrictions: No ?Vital Signs ? ?Pain ?Pain Assessment ?Pain Scale: 0-10 ?Pain Score: 0-No pain ?Faces Pain Scale: No hurt ?Pain Interference ?Pain Interference ?Pain Effect on Sleep: 1. Rarely or not at all ?Pain Interference with Therapy Activities: 1. Rarely or not at all ?Pain Interference with Day-to-Day Activities: 1. Rarely or not at all ?Vision/Perception  ?Vision - History ?Ability to See  in Adequate Light: 0 Adequate ?Vision - Assessment ?Eye Alignment: Within Functional Limits ?Ocular Range of Motion: Within Functional  Limits ?Alignment/Gaze Preference: Gaze right (Greatly improved from baseline) ?Tracking/Visual Pursuits: Able to track stimulus in all quads without difficulty ?Convergence: Within functional limits ?Perception ?Perception: Impaired ?Inattention/Neglect: Does not attend to left visual field;Does not attend to left side of body (greatly improved from baseline) ?Praxis ?Praxis: Impaired ?Praxis Impairment Details: Initiation;Motor planning ?Praxis-Other Comments: Greatly improved from baseline  ?Cognition ?Overall Cognitive Status: Impaired/Different from baseline ?Arousal/Alertness: Awake/alert ?Orientation Level: Oriented X4 ?Year: 2023 ?Month: March ?Day of Week: Correct ?Attention: Alternating;Selective ?Selective Attention: Appears intact ?Alternating Attention: Impaired ?Alternating Attention Impairment: Verbal complex;Functional complex ?Memory: Impaired ?Memory Impairment: Decreased recall of new information ?Awareness: Impaired ?Awareness Impairment: Emergent impairment ?Problem Solving: Impaired ?Problem Solving Impairment: Verbal complex;Functional complex ?Executive Function: Sequencing;Organizing ?Sequencing: Impaired ?Sequencing Impairment: Verbal complex ?Organizing: Appears intact ?Safety/Judgment: Appears intact ?Sensation ?Sensation ?Light Touch: Appears Intact ?Additional Comments: improved from baseline ?Coordination ?Gross Motor Movements are Fluid and Coordinated: No ?Fine Motor Movements are Fluid and Coordinated: No ?Coordination and Movement Description: dense L hemi ?Motor  ?Motor ?Motor: Hemiplegia;Abnormal tone;Abnormal postural alignment and control ?Motor - Skilled Clinical Observations: L hemi flaccid L UE w/depression of L shoulder ?Motor - Discharge Observations: Left hemi, emerging spasticity, no synergy note in LE  ?Mobility ?Bed Mobility ?Bed Mobility: Rolling Right;Rolling Left;Supine to Sit ?Rolling Right: Supervision/verbal cueing ?Rolling Left: Supervision/Verbal cueing ?Right  Sidelying to Sit: Supervision/Verbal cueing ?Supine to Sit: Supervision/Verbal cueing ?Transfers ?Transfers: Sit to Stand;Stand to Sit;Squat Pivot Transfers ?Sit to Stand: Minimal Assistance - Patient > 75% ?Stand to Sit: Contact Guard/Touching assist ?Squat Pivot Transfers: Supervision/Verbal cueing ?Transfer (Assistive device): None ?Locomotion  ?Gait ?Ambulation: Yes ?Gait Assistance: 2 Helpers ?Gait Distance (Feet): 100 Feet ?Assistive device: Rolling walker ?Gait Assistance Details: Verbal cues for safe use of DME/AE;Tactile cues for weight shifting;Tactile cues for placement;Verbal cues for technique;Verbal cues for gait pattern;Manual facilitation for weight shifting;Manual facilitation for weight bearing;Manual facilitation for placement;Verbal cues for precautions/safety ?Gait Assistance Details: min A to advance and stabilize LLE. Very mild L lean, trunk flexion which improves with cueing ?Gait ?Gait: Yes ?Gait Pattern: Impaired ?Gait Pattern: Step-through pattern;Decreased stance time - left;Decreased stride length;Decreased hip/knee flexion - left;Decreased dorsiflexion - left;Decreased weight shift to left;Trunk flexed (All gait deviations improve with cueing and AD) ?Gait velocity: decreased but improving daily ?High Level Ambulation ?High Level Ambulation: Side stepping ?Side Stepping: min A ?Stairs / Additional Locomotion ?Stairs: Yes ?Stairs Assistance: Minimal Assistance - Patient > 75% ?Stair Management Technique: Other (comment) (shower chair technique) ?Number of Stairs: 8 ?Height of Stairs: 6 ?Ramp: Independent with assistive device ?Wheelchair Mobility ?Wheelchair Mobility: Yes ?Wheelchair Assistance: Set up assist (needs assist to manage L leg rest only) ?Wheelchair Propulsion: Right upper extremity;Right lower extremity ?Wheelchair Parts Management: Needs assistance ?Distance: 500+  ?Trunk/Postural Assessment  ?Cervical Assessment ?Cervical Assessment: Within Functional Limits ?Thoracic  Assessment ?Thoracic Assessment: Exceptions to [facility] (scoliosis) ?Lumbar Assessment ?Lumbar Assessment: Exceptions to [facility] (scoliosis) ?Postural Control ?Postural Control: Deficits on evaluation (mildly delayed

## 2021-05-14 NOTE — Progress Notes (Signed)
Inpatient Rehabilitation Admission Medication Review by a Pharmacist ? ?A complete drug regimen review was completed for this patient to identify any potential clinically significant medication issues. ? ?High Risk Drug Classes Is patient taking? Indication by Medication  ?Antipsychotic No   ?Anticoagulant No   ?Antibiotic No   ?Opioid No   ?Antiplatelet No   ?Hypoglycemics/insulin No   ?Vasoactive Medication Yes Amlodipine, clonidine, hydralazine, Metoprolol for BP  ?Chemotherapy No   ?Other Yes Amantadine, Ritalin for attention ?Baclofen for spasms ?Lipitor for HLD ?Protonix for GERD ?Trazodone for sleep  ? ? ? ?Type of Medication Issue Identified Description of Issue Recommendation(s)  ?Drug Interaction(s) (clinically significant) ?    ?Duplicate Therapy ?    ?Allergy ?    ?No Medication Administration End Date ?    ?Incorrect Dose ?    ?Additional Drug Therapy Needed ?    ?Significant med changes from prior encounter (inform family/care partners about these prior to discharge).    ?Other ?    ? ? ?Clinically significant medication issues were identified that warrant physician communication and completion of prescribed/recommended actions by midnight of the next day:  No ? ?Time spent performing this drug regimen review (minutes):  20 minutes ? ? ?Tad Moore ?05/14/2021 8:52 AM ?

## 2021-05-14 NOTE — Progress Notes (Signed)
?                                                       PROGRESS NOTE ? ? ?Subjective/Complaints: ? ?Doing ROM- feeling good.  ? ? ? ?ROS:  ? ?Pt denies SOB, abd pain, CP, N/V/C/D, and vision changes ? ?Objective: ?  ?No results found. ?Recent Labs  ?  05/12/21 ?0613  ?WBC 7.6  ?HGB 11.4*  ?HCT 33.9*  ?PLT 210  ? ? ? ? ?Recent Labs  ?  05/12/21 ?0613 05/14/21 ?0355  ?NA 142 141  ?K 3.2* 3.9  ?CL 106 105  ?CO2 28 27  ?GLUCOSE 84 83  ?BUN 17 20  ?CREATININE 1.86* 2.00*  ?CALCIUM 8.9 9.0  ? ? ? ? ? ?Intake/Output Summary (Last 24 hours) at 05/14/2021 0802 ?Last data filed at 05/13/2021 1700 ?Gross per 24 hour  ?Intake 940 ml  ?Output --  ?Net 940 ml  ?  ? ?  ? ?Physical Exam: ?Vital Signs ?Blood pressure 133/74, pulse (!) 58, temperature 98.7 ?F (37.1 ?C), temperature source Oral, resp. rate 18, height 5\' 4"  (1.626 m), weight 76.2 kg, SpO2 98 %. ? ? ? ? ?General: awake, alert, appropriate, sitting up in bedside chair; wife at bedside; NAD ?HENT: conjugate gaze; oropharynx moist ?CV: regular rate; no JVD ?Pulmonary: CTA B/L; no W/R/R- good air movement ?GI: soft, NT, ND, (+)BS ?Psychiatric: appropriate- brighter affect ?Neurological: Ox3 ?MAS of 2 in LUE ?Musculoskeletal:  ?   Cervical back: Neck supple. No tenderness.  ? 0/5 on L side still ?Skin: ?   General: Skin is warm and dry.  ?Neurological:  ?   Mental Status: He is alert and oriented to person, place, and time.  ?   Comments: Mild left facial weakness with left inattention and mild left hemifacial sensory loss.  Speech clear and able to follow commands without difficulty. Dense left hemiplegia with sensory deficits. LUE /LLE 0/5 except for trace at foot. RUE and RLE 5/5. MAS 1+ Left shoulder/elbow ?  ?  ? ?Assessment/Plan: ?1. Functional deficits which require 3+ hours per day of interdisciplinary therapy in a comprehensive inpatient rehab setting. ?Physiatrist is providing close team supervision and 24 hour management of active medical problems listed  below. ?Physiatrist and rehab team continue to assess barriers to discharge/monitor patient progress toward functional and medical goals ? ?Care Tool: ? ?Bathing ?   ?Body parts bathed by patient: Chest, Abdomen, Front perineal area, Right upper leg, Left upper leg, Face, Left arm, Right lower leg, Left lower leg  ? Body parts bathed by helper: Buttocks, Right arm, Right lower leg, Left lower leg ?  ?  ?Bathing assist Assist Level: Moderate Assistance - Patient 50 - 74% (Per family report; wife prefers to assist patient with bathing) ?  ?  ?Upper Body Dressing/Undressing ?Upper body dressing   ?What is the patient wearing?: Pull over shirt ?   ?Upper body assist Assist Level: Minimal Assistance - Patient > 75% ?   ?Lower Body Dressing/Undressing ?Lower body dressing ? ? ?   ?What is the patient wearing?: Pants, Underwear/pull up ? ?  ? ?Lower body assist Assist for lower body dressing: Moderate Assistance - Patient 50 - 74% ?   ? ?Toileting ?Toileting Toileting Activity did not occur Landscape architect and hygiene only): N/A (no  void or bm)  ?Toileting assist Assist for toileting: Moderate Assistance - Patient 50 - 74% ?  ?  ?Transfers ?Chair/bed transfer ? ?Transfers assist ?   ? ?Chair/bed transfer assist level: Minimal Assistance - Patient > 75% ?  ?  ?Locomotion ?Ambulation ? ? ?Ambulation assist ? ?   ? ?Assist level: Moderate Assistance - Patient 50 - 74% ?Assistive device: Walker-rolling ?Max distance: 8  ? ?Walk 10 feet activity ? ? ?Assist ? Walk 10 feet activity did not occur: Safety/medical concerns ? ?Assist level: Minimal Assistance - Patient > 75% ?Assistive device: Other (comment) (hall rail)  ? ?Walk 50 feet activity ? ? ?Assist Walk 50 feet with 2 turns activity did not occur: Safety/medical concerns ? ?Assist level: 2 helpers ?Assistive device: Lite Gait  ? ? ?Walk 150 feet activity ? ? ?Assist Walk 150 feet activity did not occur: Safety/medical concerns ? ?Assist level: 2 helpers ?Assistive  device: Lite Gait ?  ? ?Walk 10 feet on uneven surface  ?activity ? ? ?Assist Walk 10 feet on uneven surfaces activity did not occur: Safety/medical concerns ? ? ?  ?   ? ?Wheelchair ? ? ? ? ?Assist Is the patient using a wheelchair?: Yes ?Type of Wheelchair: Manual ?  ? ?Wheelchair assist level: Supervision/Verbal cueing ?Max wheelchair distance: 90  ? ? ?Wheelchair 50 feet with 2 turns activity ? ? ? ?Assist ? ?  ?  ? ? ?Assist Level: Supervision/Verbal cueing  ? ?Wheelchair 150 feet activity  ? ? ? ?Assist ?   ? ? ?Assist Level: Supervision/Verbal cueing  ? ?Blood pressure 133/74, pulse (!) 58, temperature 98.7 ?F (37.1 ?C), temperature source Oral, resp. rate 18, height 5\' 4"  (1.626 m), weight 76.2 kg, SpO2 98 %. ? ?Medical Problem List and Plan: ?1. 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            -patient may  shower ?            -ELOS/Goals: 14-20 days- min A to supervision ? D/c date 3/30 ? D/c tomorrow- Continue CIR- PT, OT and SLP ?  ?2.  Antithrombotics: ?-DVT/anticoagulation:  lovenox ? -dopplers reviewed and negative ?            -antiplatelet therapy: N/A due to bleed.  ?3. Pain Management:: On tylenol as needed ?4. Mood: LCSW to follow for evaluation an dsup ?            -antipsychotic agents: N/A ?5. Neuropsych: This patient is capable of making decisions on his own behalf. ?6. Skin/Wound Care: Routine pressure relief measures.  ?7. Fluids/Electrolytes/Nutrition: Monitor I/O. Check CMET in am.  ?8. HTN: goal to keep SBP< 160 ? -3/5 bp remains poorly controlled ?            -- Catapress 2 mg TID, metoprolol increased to 75 mg BID and Norvasc 10 qd. ?  -will begin scheduled hydralazine 50mg  q8 ? -will use addnl hydralazine prn  ? On Norvasc; Hydralazine 100 mg q8 hours and Clonidine patch 0.3 and metoprolol 75 mg BID ?Vitals:  ? 05/13/21 2212 05/14/21 1761  ?BP: (!) 156/96 133/74  ?Pulse:  (!) 58  ?Resp:  18  ?Temp:  98.7 ?F (37.1 ?C)  ?SpO2:  98%   ? ?3/28- borderline but maxxed out on multiple meds- pt doesn't want diuretic if possible- suggest getting BP cuff at home and checking BP 1-2x/day and can send home on prn  Hydralazine to take if need be- PCP can assess if needs other meds ?3/29- can afford Catapres patch- con't regimen ?9. Persistent hypokalemia: Start Kdur 20 meq daily. Check Mg level ? 3/3- Will replete 40 mEq x2 and recheck Monday- last Mg on 2/28 was 2.3 ? 3/6- K+ up to 3.6- con't to monitor ? 3/8- labs in AM ? 3/9- Mg 1.8 and K+ 3.5- will con't to monitor ? 3/12- labs Monday  ? 3/14- K+ 3.7- con't to monitor ? 3/16- K+ 3.5- still in normal range, although low- will ask pt to eat bananas, etc ? 3/27- K+ 3.2 today- repleted- will recheck before d/c. Also recheck Mg level ? 3/28 Mg 1.7- will replete  MgOx 400 mg daily- and recheck BMP in AM for K+ since repleted.  ? 3/29- K+ up to 3.9- doing better ?10.  Severe LVH: Has been referred to cardiology for work up. Continue metoprolol bid.  ?11. 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  ?--Renal ultrasound with mild evidence of medical renal disease ?            --encourage fluid intake. ?3/3- down slightly to 2.21- will avoid nephrotoxic agents ?3/9- Renal U/S normal- Cr up slightly to 1.99 and BUN 25- think it's leveling out- will con't to monitor ?3/13- Cr up slightly more to 2.08- and BUN 23- will recheck in Am and push fluids- also, might have infection/UTI which could affect Cr slightly? ? 3/14- U/A (-) and Cr up again to 2.12 and BUN 23- has (+) tenting- asked pt to drink 6-8 cups/day and will recheck Thursday ?3/16- Cr up to 1.95 and BUN 21- doing much better when drinks- con't regimen ?  ? ?  Latest Ref Rng & Units 05/14/2021  ?  5:09 AM 05/12/2021  ?  6:13 AM 05/05/2021  ?  8:22 AM  ?BMP  ?Glucose 70 - 99 mg/dL 83   84   153    ?BUN 6 - 20 mg/dL 20   17   17     ?Creatinine 0.61 - 1.24 mg/dL 2.00   1.86   2.07    ?Sodium 135 - 145 mmol/L 141   142    139    ?Potassium 3.5 - 5.1 mmol/L 3.9   3.2   3.5    ?Chloride 98 - 111 mmol/L 105   106   105    ?CO2 22 - 32 mmol/L 27   28   23     ?Calcium 8.9 - 10.3 mg/dL 9.0   8.9   9.2    ?  3/27- Cr better at 1.86- con't to mon

## 2021-05-15 ENCOUNTER — Other Ambulatory Visit (HOSPITAL_COMMUNITY): Payer: Self-pay

## 2021-05-15 DIAGNOSIS — K5900 Constipation, unspecified: Secondary | ICD-10-CM

## 2021-05-15 DIAGNOSIS — I10 Essential (primary) hypertension: Secondary | ICD-10-CM

## 2021-05-15 DIAGNOSIS — R252 Cramp and spasm: Secondary | ICD-10-CM

## 2021-05-15 DIAGNOSIS — N179 Acute kidney failure, unspecified: Secondary | ICD-10-CM

## 2021-05-15 DIAGNOSIS — E785 Hyperlipidemia, unspecified: Secondary | ICD-10-CM

## 2021-05-15 DIAGNOSIS — F32A Depression, unspecified: Secondary | ICD-10-CM

## 2021-05-15 MED ORDER — POTASSIUM CHLORIDE CRYS ER 20 MEQ PO TBCR
20.0000 meq | EXTENDED_RELEASE_TABLET | Freq: Two times a day (BID) | ORAL | 0 refills | Status: DC
Start: 2021-05-15 — End: 2021-05-20
  Filled 2021-05-15: qty 60, 30d supply, fill #0

## 2021-05-15 MED ORDER — FLUTICASONE PROPIONATE 50 MCG/ACT NA SUSP
1.0000 | Freq: Every day | NASAL | 0 refills | Status: DC
  Filled 2021-05-15: qty 16, 30d supply, fill #0

## 2021-05-15 MED ORDER — AMLODIPINE BESYLATE 10 MG PO TABS
10.0000 mg | ORAL_TABLET | Freq: Every day | ORAL | 0 refills | Status: DC
  Filled 2021-05-15: qty 30, 30d supply, fill #0

## 2021-05-15 MED ORDER — METOPROLOL TARTRATE 50 MG PO TABS
75.0000 mg | ORAL_TABLET | Freq: Two times a day (BID) | ORAL | 0 refills | Status: DC
  Filled 2021-05-15: qty 90, 30d supply, fill #0

## 2021-05-15 MED ORDER — MAGNESIUM OXIDE 400 MG PO TABS
400.0000 mg | ORAL_TABLET | Freq: Every day | ORAL | 0 refills | Status: DC
  Filled 2021-05-15: qty 30, 30d supply, fill #0

## 2021-05-15 MED ORDER — ATORVASTATIN CALCIUM 40 MG PO TABS
40.0000 mg | ORAL_TABLET | Freq: Every day | ORAL | 0 refills | Status: DC
  Filled 2021-05-15: qty 30, 30d supply, fill #0

## 2021-05-15 MED ORDER — HYDROCORTISONE 1 % EX CREA
TOPICAL_CREAM | Freq: Three times a day (TID) | CUTANEOUS | 0 refills | Status: DC
  Filled 2021-05-15: qty 28, 7d supply, fill #0

## 2021-05-15 MED ORDER — TRAZODONE HCL 150 MG PO TABS
75.0000 mg | ORAL_TABLET | Freq: Every day | ORAL | 0 refills | Status: DC
  Filled 2021-05-15: qty 15, 30d supply, fill #0

## 2021-05-15 MED ORDER — SENNOSIDES-DOCUSATE SODIUM 8.6-50 MG PO TABS
2.0000 | ORAL_TABLET | Freq: Every day | ORAL | 0 refills | Status: DC
Start: 2021-05-15 — End: 2021-05-20
  Filled 2021-05-15: qty 60, 30d supply, fill #0

## 2021-05-15 MED ORDER — CITALOPRAM HYDROBROMIDE 20 MG PO TABS
20.0000 mg | ORAL_TABLET | Freq: Every day | ORAL | 0 refills | Status: DC
  Filled 2021-05-15: qty 30, 30d supply, fill #0

## 2021-05-15 MED ORDER — CLONIDINE 0.3 MG/24HR TD PTWK
0.3000 mg | MEDICATED_PATCH | TRANSDERMAL | 0 refills | Status: DC
  Filled 2021-05-15: qty 4, 28d supply, fill #0

## 2021-05-15 MED ORDER — METHYLPHENIDATE HCL 5 MG PO TABS
2.5000 mg | ORAL_TABLET | Freq: Every day | ORAL | 0 refills | Status: DC
  Filled 2021-05-15: qty 15, 30d supply, fill #0

## 2021-05-15 MED ORDER — HYDRALAZINE HCL 100 MG PO TABS
100.0000 mg | ORAL_TABLET | Freq: Three times a day (TID) | ORAL | 0 refills | Status: DC
  Filled 2021-05-15: qty 90, 30d supply, fill #0

## 2021-05-15 MED ORDER — AMANTADINE HCL 100 MG PO CAPS
100.0000 mg | ORAL_CAPSULE | Freq: Every day | ORAL | 0 refills | Status: DC
  Filled 2021-05-15: qty 30, 30d supply, fill #0

## 2021-05-15 MED ORDER — BACLOFEN 10 MG PO TABS
10.0000 mg | ORAL_TABLET | Freq: Three times a day (TID) | ORAL | 0 refills | Status: DC
  Filled 2021-05-15: qty 90, 30d supply, fill #0

## 2021-05-15 MED ORDER — ACETAMINOPHEN 325 MG PO TABS
325.0000 mg | ORAL_TABLET | ORAL | 0 refills | Status: DC | PRN
Start: 2021-05-15 — End: 2021-12-10
  Filled 2021-05-15: qty 100, 9d supply, fill #0

## 2021-05-15 MED ORDER — PANTOPRAZOLE SODIUM 40 MG PO TBEC
40.0000 mg | DELAYED_RELEASE_TABLET | Freq: Every day | ORAL | 0 refills | Status: DC
  Filled 2021-05-15: qty 30, 30d supply, fill #0

## 2021-05-15 NOTE — Progress Notes (Signed)
?                                                       PROGRESS NOTE ? ? ?Subjective/Complaints: ? ?Ready for d/c- went over again with pt needs ot see me- for spasticity and IM as well as renal/kidney doctor for kidney issues.  ? ? ?ROS:  ? ? ?Pt denies SOB, abd pain, CP, N/V/C/D, and vision changes ? ?Objective: ?  ?No results found. ?No results for input(s): WBC, HGB, HCT, PLT in the last 72 hours. ? ? ? ? ?Recent Labs  ?  05/14/21 ?3536  ?NA 141  ?K 3.9  ?CL 105  ?CO2 27  ?GLUCOSE 83  ?BUN 20  ?CREATININE 2.00*  ?CALCIUM 9.0  ? ? ? ? ? ?Intake/Output Summary (Last 24 hours) at 05/15/2021 1026 ?Last data filed at 05/15/2021 0700 ?Gross per 24 hour  ?Intake 658 ml  ?Output --  ?Net 658 ml  ?  ? ?  ? ?Physical Exam: ?Vital Signs ?Blood pressure 139/86, pulse 63, temperature 97.8 ?F (36.6 ?C), resp. rate 18, height 5\' 4"  (1.626 m), weight 76.2 kg, SpO2 95 %. ? ? ? ? ? ?General: awake, alert, appropriate, sitting up in bedside chair; wife not at bedside; NAD ?HENT: conjugate gaze; oropharynx moist ?CV: regular rate; no JVD ?Pulmonary: CTA B/L; no W/R/R- good air movement ?GI: soft, NT, ND, (+)BS ?Psychiatric: appropriate ?Neurological: Ox3- MAS of 2 in LUE ?Musculoskeletal:  ?   Cervical back: Neck supple. No tenderness.  ? 0/5 on L side still ?Skin: ?   General: Skin is warm and dry.  ?Neurological:  ?   Mental Status: He is alert and oriented to person, place, and time.  ?   Comments: Mild left facial weakness with left inattention and mild left hemifacial sensory loss.  Speech clear and able to follow commands without difficulty. Dense left hemiplegia with sensory deficits. LUE /LLE 0/5 except for trace at foot. RUE and RLE 5/5. MAS 1+ Left shoulder/elbow ?  ?  ? ?Assessment/Plan: ?1. Functional deficits which require 3+ hours per day of interdisciplinary therapy in a comprehensive inpatient rehab setting. ?Physiatrist is providing close team supervision and 24 hour management of active medical problems listed  below. ?Physiatrist and rehab team continue to assess barriers to discharge/monitor patient progress toward functional and medical goals ? ?Care Tool: ? ?Bathing ?   ?Body parts bathed by patient: Chest, Abdomen, Front perineal area, Right upper leg, Left upper leg, Face, Left arm, Right lower leg, Left lower leg  ? Body parts bathed by helper: Buttocks, Right arm, Right lower leg, Left lower leg ?  ?  ?Bathing assist Assist Level: Moderate Assistance - Patient 50 - 74% (Per family report; wife prefers to assist patient with bathing) ?  ?  ?Upper Body Dressing/Undressing ?Upper body dressing   ?What is the patient wearing?: Pull over shirt ?   ?Upper body assist Assist Level: Minimal Assistance - Patient > 75% ?   ?Lower Body Dressing/Undressing ?Lower body dressing ? ? ?   ?What is the patient wearing?: Pants, Underwear/pull up ? ?  ? ?Lower body assist Assist for lower body dressing: Moderate Assistance - Patient 50 - 74% ?   ? ?Toileting ?Toileting Toileting Activity did not occur (Probation officer and hygiene only): N/A (no void  or bm)  ?Toileting assist Assist for toileting: Moderate Assistance - Patient 50 - 74% ?  ?  ?Transfers ?Chair/bed transfer ? ?Transfers assist ?   ? ?Chair/bed transfer assist level: Supervision/Verbal cueing ?  ?  ?Locomotion ?Ambulation ? ? ?Ambulation assist ? ?   ? ?Assist level: Minimal Assistance - Patient > 75% ?Assistive device: Walker-rolling ?Max distance: 100  ? ?Walk 10 feet activity ? ? ?Assist ? Walk 10 feet activity did not occur: Safety/medical concerns ? ?Assist level: Minimal Assistance - Patient > 75% ?Assistive device: Walker-rolling  ? ?Walk 50 feet activity ? ? ?Assist Walk 50 feet with 2 turns activity did not occur: Safety/medical concerns ? ?Assist level: Minimal Assistance - Patient > 75% ?Assistive device: Walker-rolling  ? ? ?Walk 150 feet activity ? ? ?Assist Walk 150 feet activity did not occur: Safety/medical concerns ? ?Assist level: 2  helpers ?Assistive device: Lite Gait ?  ? ?Walk 10 feet on uneven surface  ?activity ? ? ?Assist Walk 10 feet on uneven surfaces activity did not occur: Safety/medical concerns ? ? ?  ?   ? ?Wheelchair ? ? ? ? ?Assist Is the patient using a wheelchair?: Yes ?Type of Wheelchair: Manual ?  ? ?Wheelchair assist level: Set up assist ?Max wheelchair distance: 500+  ? ? ?Wheelchair 50 feet with 2 turns activity ? ? ? ?Assist ? ?  ?  ? ? ?Assist Level: Independent  ? ?Wheelchair 150 feet activity  ? ? ? ?Assist ?   ? ? ?Assist Level: Independent  ? ?Blood pressure 139/86, pulse 63, temperature 97.8 ?F (36.6 ?C), resp. rate 18, height 5\' 4"  (1.626 m), weight 76.2 kg, SpO2 95 %. ? ?Medical Problem List and Plan: ?1. 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            -patient may  shower ?            -ELOS/Goals: 14-20 days- min A to supervision ? D/c date 3/30 ? D/c today- will need f/u with renal ?2.  Antithrombotics: ?-DVT/anticoagulation:  lovenox ? -dopplers reviewed and negative ?            -antiplatelet therapy: N/A due to bleed.  ?3. Pain Management:: On tylenol as needed ?4. Mood: LCSW to follow for evaluation an dsup ?            -antipsychotic agents: N/A ?5. Neuropsych: This patient is capable of making decisions on his own behalf. ?6. Skin/Wound Care: Routine pressure relief measures.  ?7. Fluids/Electrolytes/Nutrition: Monitor I/O. Check CMET in am.  ?8. HTN: goal to keep SBP< 160 ? -3/5 bp remains poorly controlled ?            -- Catapress 2 mg TID, metoprolol increased to 75 mg BID and Norvasc 10 qd. ?  -will begin scheduled hydralazine 50mg  q8 ? -will use addnl hydralazine prn  ? On Norvasc; Hydralazine 100 mg q8 hours and Clonidine patch 0.3 and metoprolol 75 mg BID ?Vitals:  ? 05/14/21 1956 05/15/21 0619  ?BP: 130/79 139/86  ?Pulse: (!) 56 63  ?Resp: 17 18  ?Temp: 97.7 ?F (36.5 ?C) 97.8 ?F (36.6 ?C)  ?SpO2: 99% 95%  ? ?3/28- borderline but maxxed out on  multiple meds- pt doesn't want diuretic if possible- suggest getting BP cuff at home and checking BP 1-2x/day and can send home on prn Hydralazine to take if need be- PCP can assess if needs other  meds ?3/29- can afford Catapres patch- con't regimen ? 3/30- BP looks better today- con't multiple drug regimen ?9. Persistent hypokalemia: Start Kdur 20 meq daily. Check Mg level ? 3/3- Will replete 40 mEq x2 and recheck Monday- last Mg on 2/28 was 2.3 ? 3/6- K+ up to 3.6- con't to monitor ? 3/8- labs in AM ? 3/9- Mg 1.8 and K+ 3.5- will con't to monitor ? 3/12- labs Monday  ? 3/14- K+ 3.7- con't to monitor ? 3/16- K+ 3.5- still in normal range, although low- will ask pt to eat bananas, etc ? 3/27- K+ 3.2 today- repleted- will recheck before d/c. Also recheck Mg level ? 3/28 Mg 1.7- will replete  MgOx 400 mg daily- and recheck BMP in AM for K+ since repleted.  ? 3/29- K+ up to 3.9- doing better ?10.  Severe LVH: Has been referred to cardiology for work up. Continue metoprolol bid.  ?11. 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  ?--Renal ultrasound with mild evidence of medical renal disease ?            --encourage fluid intake. ?3/3- down slightly to 2.21- will avoid nephrotoxic agents ?3/9- Renal U/S normal- Cr up slightly to 1.99 and BUN 25- think it's leveling out- will con't to monitor ?3/13- Cr up slightly more to 2.08- and BUN 23- will recheck in Am and push fluids- also, might have infection/UTI which could affect Cr slightly? ? 3/14- U/A (-) and Cr up again to 2.12 and BUN 23- has (+) tenting- asked pt to drink 6-8 cups/day and will recheck Thursday ?3/16- Cr up to 1.95 and BUN 21- doing much better when drinks- con't regimen ?  ? ?  Latest Ref Rng & Units 05/14/2021  ?  5:09 AM 05/12/2021  ?  6:13 AM 05/05/2021  ?  8:22 AM  ?BMP  ?Glucose 70 - 99 mg/dL 83   84   153    ?BUN 6 - 20 mg/dL 20   17   17     ?Creatinine 0.61 - 1.24 mg/dL 2.00   1.86   2.07    ?Sodium 135 - 145  mmol/L 141   142   139    ?Potassium 3.5 - 5.1 mmol/L 3.9   3.2   3.5    ?Chloride 98 - 111 mmol/L 105   106   105    ?CO2 22 - 32 mmol/L 27   28   23     ?Calcium 8.9 - 10.3 mg/dL 9.0   8.9   9.2    ?  3/27- Cr better at 1.86

## 2021-05-15 NOTE — Progress Notes (Signed)
Orthopedic Tech Progress Note ?Patient Details:  ?Miguel Mccullough ?07/18/1965 ?[id number] ?Called in order to [name] ?Patient ID: [name], male   DOB: June 11, 1965, 56 y.o.   MRN: [id number] ? ?[name] A [name] ?05/15/2021, 11:07 AM ? ?

## 2021-05-15 NOTE — Discharge Summary (Signed)
Physician Discharge Summary  ?Patient ID: ?Miguel Mccullough ?MRN: [id number] ?DOB/AGE: 07-29-1965 56 y.o. ? ?Admit date: 04/17/2021 ?Discharge date: 05/15/2021 ? ?Discharge Diagnoses:  ?Principal Problem: ?  ICH (intracerebral hemorrhage) ([place]) ?Active Problems: ?  Accelerated hypertension ?  Acute renal failure ([place]) ?  Elevated lipids ?  Spasticity ?  Depression ?  Constipation ? ? ?Discharged Condition: good ? ?Significant Diagnostic Studies: ?US RENAL ? ?Result Date: 04/17/2021 ?CLINICAL DATA:  AK I EXAM: RENAL / URINARY TRACT ULTRASOUND COMPLETE COMPARISON:  None. FINDINGS: Right Kidney: Renal measurements: 8.5 x 4.9 x 4.7 cm = volume: 103 mL. Echogenicity appears minimally increased, possibly due to technical related factors. No mass or hydronephrosis visualized. Left Kidney: Renal measurements: 9.1 x 5.3 x 5.2 cm = volume: 132 mL. Echogenicity appears minimally increased, possibly due to technical related factors. No mass or hydronephrosis visualized. Bladder: Appears normal for degree of bladder distention. Other: None. IMPRESSION: No hydronephrosis. Electronically Signed   By: Valentino Saxon M.D.   On: 04/17/2021 10:20  ? ?VAS [place] LOWER EXTREMITY VENOUS (DVT) ? ?Result Date: 04/20/2021 ? Lower Venous DVT Study Patient Name:  Miguel Mccullough  Date of Exam:   04/19/2021 Medical Rec #: [id number]      Accession #:    [id number] Date of Birth: Nov 04, 1965       Patient Gender: M Patient Age:   56 years Exam Location:  [facility] Procedure:      VAS [place] LOWER EXTREMITY VENOUS (DVT) Referring Phys: Fredericka Bottcher --------------------------------------------------------------------------------  Indications: Immobility.  Risk Factors: None identified. Comparison Study: No prior studies. Performing Technologist: Oliver Hum RVT  Examination Guidelines: A complete evaluation includes B-mode imaging, spectral Doppler, color Doppler, and power Doppler as needed of all accessible portions of each vessel. Bilateral testing is  considered an integral part of a complete examination. Limited examinations for reoccurring indications may be performed as noted. The reflux portion of the exam is performed with the patient in reverse Trendelenburg.  +---------+---------------+---------+-----------+----------+--------------+ RIGHT    CompressibilityPhasicitySpontaneityPropertiesThrombus Aging +---------+---------------+---------+-----------+----------+--------------+ CFV      Full           Yes      Yes                                 +---------+---------------+---------+-----------+----------+--------------+ SFJ      Full                                                        +---------+---------------+---------+-----------+----------+--------------+ FV Prox  Full                                                        +---------+---------------+---------+-----------+----------+--------------+ FV Mid   Full                                                        +---------+---------------+---------+-----------+----------+--------------+ FV DistalFull                                                        +---------+---------------+---------+-----------+----------+--------------+  PFV      Full                                                        +---------+---------------+---------+-----------+----------+--------------+ POP      Full           Yes      Yes                                 +---------+---------------+---------+-----------+----------+--------------+ PTV      Full                                                        +---------+---------------+---------+-----------+----------+--------------+ PERO     Full                                                        +---------+---------------+---------+-----------+----------+--------------+   +---------+---------------+---------+-----------+----------+--------------+ LEFT      CompressibilityPhasicitySpontaneityPropertiesThrombus Aging +---------+---------------+---------+-----------+----------+--------------+ CFV      Full           Yes      Yes                                 +---------+---------------+---------+-----------+----------+--------------+ SFJ      Full                                                        +---------+---------------+---------+-----------+----------+--------------+ FV Prox  Full                                                        +---------+---------------+---------+-----------+----------+--------------+ FV Mid   Full                                                        +---------+---------------+---------+-----------+----------+--------------+ FV DistalFull                                                        +---------+---------------+---------+-----------+----------+--------------+ PFV      Full                                                        +---------+---------------+---------+-----------+----------+--------------+  POP      Full           Yes      Yes                                 +---------+---------------+---------+-----------+----------+--------------+ PTV      Full                                                        +---------+---------------+---------+-----------+----------+--------------+ PERO     Full                                                        +---------+---------------+---------+-----------+----------+--------------+     Summary: RIGHT: - There is no evidence of deep vein thrombosis in the lower extremity.  - No cystic structure found in the popliteal fossa.  LEFT: - There is no evidence of deep vein thrombosis in the lower extremity.  - No cystic structure found in the popliteal fossa.  *See table(s) above for measurements and observations. Electronically signed by Orlie Pollen on 04/20/2021 at 10:12:37 AM.    Final    ? ?Labs:  ?Basic Metabolic  Panel: ?Recent Labs  ?Lab 05/12/21 ?0865 05/13/21 ?0617 05/14/21 ?7846  ?NA 142  --  141  ?K 3.2*  --  3.9  ?CL 106  --  105  ?CO2 28  --  27  ?GLUCOSE 84  --  83  ?BUN 17  --  20  ?CREATININE 1.86*  --  2.00*  ?CALCIUM 8.9  --  9.0  ?MG  --  1.7  --   ? ? ?CBC: ? ?  Latest Ref Rng & Units 05/12/2021  ?  6:13 AM 05/05/2021  ?  8:22 AM 04/29/2021  ?  5:37 AM  ?CBC  ?WBC 4.0 - 10.5 K/uL 7.6   7.8   9.5    ?Hemoglobin 13.0 - 17.0 g/dL 11.4   12.3   11.7    ?Hematocrit 39.0 - 52.0 % 33.9   36.9   35.1    ?Platelets 150 - 400 K/uL 210   228   261    ?  ? ?  05/15/2021  ?  6:19 AM 05/14/2021  ?  7:56 PM 05/14/2021  ?  3:55 PM  ?Vitals with BMI  ?Systolic 962 952   ?Diastolic 86 79   ?Pulse 63 56 60  ?  ?CBG: ?No results for input(s): GLUCAP in the last 168 hours. ? ?Brief HPI:   Miguel Mccullough is a 56 y.o. male with history of HTN but no medications as had not followed up with PCP for years.  He was admitted on 04/13/2021 with sudden onset of slurred speech with left-sided numbness followed by weakness as well as elevated blood pressure.  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  He was started on Cleviprex for BP control with follow-up CT head showing no change in IPH.  2D echo was done showing EF of 70 to 75% with severe LVH and grade 2 diastolic dysfunction as well as mild to  moderate LA dilatation.   ? ?Renal ultrasound was done due to worsening of renal status as well as question of CKD.  This showed minimal increase in echogenicity.  He was weaned off Cleviprex and BP medicines are being titrated.  SBP goal less than 160.  He continued to be limited by cognitive deficits with poor safety awareness, mild oropharyngeal dysphagia, left spastic hemiplegia with sensory deficits affecting functional status.  CIR was recommended due to functional decline. ? ? ?Hospital Course: Miguel Mccullough was admitted to rehab 04/17/2021 for inpatient therapies to consist of PT, ST and  OT at least three hours five days a week. Past admission physiatrist, therapy team and rehab RN have worked together to provide customized collaborative inpatient rehab. His blood pressures were monitored on TID basis and continued to be labile.  Catapres has been titrated upwards and hydralazine was added additionally with improvement in control.  K-Dur was added to supplement hypokalemi

## 2021-05-15 NOTE — Progress Notes (Signed)
Discharge instructions and medications reviewed by Reesa Chew, PA.  ?Patient received discharge medications and all DME. Staff assisted patient off unit and into private car. Patient discharged safely. ?

## 2021-05-16 NOTE — Progress Notes (Signed)
Occupational Therapy Discharge Summary ? ?Patient Details  ?Name: Miguel Mccullough ?MRN: [id number] ?Date of Birth: Oct 17, 1965 ? ? ? ?Patient has met 8 of 8 long term goals due to improved activity tolerance, improved balance, postural control, ability to compensate for deficits, functional use of  LEFT upper and LEFT lower extremity, and improved awareness.  Patient to discharge at [facility] Assist level.  Patient's care partner is independent to provide the necessary physical assistance at discharge.   ? ?Reasons goals not met: n/a ? ?Recommendation:  ?Patient will benefit from ongoing skilled OT services in home health setting to continue to advance functional skills in the area of BADL and Reduce care partner burden. ? ?Equipment: ?No equipment provided ? ?Reasons for discharge: treatment goals met and discharge from hospital ? ?Patient/family agrees with progress made and goals achieved: Yes ? ?OT Discharge ?Precautions/Restrictions  ?Precautions ?Precautions: Fall ?Precaution Comments: Flaccid Lt hemi, hx scoliosis, HTN ?Restrictions ?Weight Bearing Restrictions: No ? ?ADL ?ADL ?Grooming: Supervision/safety ?Where Assessed-Grooming: Wheelchair ?Upper Body Bathing: Minimal assistance ?Where Assessed-Upper Body Bathing: Edge of bed ?Lower Body Bathing: Minimal assistance ?Where Assessed-Lower Body Bathing: Edge of bed, Other (Comment) (standing as needed in [place]) ?Upper Body Dressing: Minimal assistance ?Where Assessed-Upper Body Dressing: Edge of bed ?Lower Body Dressing: Minimal assistance ?Where Assessed-Lower Body Dressing: Edge of bed, Other (Comment) (sit<stand in [place] as needed) ?Toileting: Minimal assistance ?Where Assessed-Toileting: Toilet ?Toilet Transfer: Minimal assistance ?Toilet Transfer Method: Stand pivot, Other (comment) (Stedy lift) ?Toilet Transfer Equipment: Raised toilet seat ?Tub/Shower Transfer: Not assessed ?ADL Comments: Toilet transfer completed with bariatric Stedy with +2  assist ?Vision ?Patient Visual Report: No change from baseline ?Perception  ?Perception:  (improved) ?Inattention/Neglect: Does not attend to left visual field;Does not attend to left side of body;Other (comment) ?Praxis ?Praxis: Impaired ?Praxis Impairment Details: Initiation;Motor planning ?Cognition ?Cognition ?Overall Cognitive Status: Impaired/Different from baseline ?Arousal/Alertness: Awake/alert ?Orientation Level: Person;Place;Situation ?Person: Oriented ?Place: Oriented ?Situation: Oriented ?Memory Impairment: Decreased recall of new information ?Sustained Attention: Appears intact ?Selective Attention: Appears intact ?Awareness: Impaired ?Awareness Impairment: Emergent impairment ?Problem Solving: Impaired ?Problem Solving Impairment: Verbal complex;Functional complex ?Sequencing: Impaired ?Brief Interview for Mental Status (BIMS) ?Repetition of Three Words (First Attempt): No answer ?Temporal Orientation: Year: No answer ?Temporal Orientation: Month: No answer ?Temporal Orientation: Day: No answer ?Recall: "Sock": No answer ?Recall: "Blue": No answer ?Recall: "Bed": No answer ?BIMS Summary Score: 99 ?Sensation ?Sensation ?Light Touch: Impaired Detail ?Light Touch Impaired Details: Impaired LUE ?Motor  ?Motor ?Motor: Hemiplegia;Abnormal tone;Abnormal postural alignment and control ?Motor - Discharge Observations: Left hemi, emerging spasticity, no synergy note in LE ?Mobility  ?Bed Mobility ?Rolling Right: Supervision/verbal cueing ?Rolling Left: Supervision/Verbal cueing ?Right Sidelying to Sit: Supervision/Verbal cueing ?Supine to Sit: Supervision/Verbal cueing ?Transfers ?Sit to Stand: Minimal Assistance - Patient > 75% ?Stand to Sit: Contact Guard/Touching assist  ?Trunk/Postural Assessment  ?Cervical Assessment ?Cervical Assessment: Within Functional Limits ?Thoracic Assessment ?Thoracic Assessment: Exceptions to [facility] ?Lumbar Assessment ?Lumbar Assessment: Exceptions to [facility] ?Postural Control ?Postural  Control: Deficits on evaluation ?Righting Reactions: delayed  ?Balance ?Static Sitting Balance ?Static Sitting - Level of Assistance: 6: Modified independent (Device/Increase time) ?Dynamic Sitting Balance ?Dynamic Sitting - Balance Support: During functional activity ?Dynamic Sitting - Level of Assistance: 5: Stand by assistance ?Static Standing Balance ?Static Standing - Balance Support: During functional activity ?Static Standing - Level of Assistance: 4: Min assist ?Dynamic Standing Balance ?Dynamic Standing - Balance Support: During functional activity ?Dynamic Standing - Level of Assistance: 4: Min assist ?Dynamic Standing -  Balance Activities: Lateral lean/weight shifting;Forward lean/weight shifting ?Extremity/Trunk Assessment ?RUE Assessment ?RUE Assessment: Within Functional Limits ?LUE Assessment ?LUE Assessment: Exceptions to [facility] ?General Strength Comments: 0/5 ?LUE Body System: Neuro ?Brunstrum levels for arm and hand: Arm;Hand ?Brunstrum level for arm: Stage II Synergy is developing ?Brunstrum level for hand: Stage II Synergy is developing ?LUE Tone ?LUE Tone: Moderate ? ? ?Nicoletta Ba ?05/16/2021, 9:35 AM ?

## 2021-05-19 ENCOUNTER — Encounter: Payer: Self-pay | Admitting: Physical Therapy

## 2021-05-19 ENCOUNTER — Ambulatory Visit: Payer: Self-pay | Attending: Physical Medicine and Rehabilitation | Admitting: Physical Therapy

## 2021-05-19 DIAGNOSIS — R278 Other lack of coordination: Secondary | ICD-10-CM | POA: Insufficient documentation

## 2021-05-19 DIAGNOSIS — R41841 Cognitive communication deficit: Secondary | ICD-10-CM | POA: Insufficient documentation

## 2021-05-19 DIAGNOSIS — R41840 Attention and concentration deficit: Secondary | ICD-10-CM | POA: Insufficient documentation

## 2021-05-19 DIAGNOSIS — R5381 Other malaise: Secondary | ICD-10-CM

## 2021-05-19 DIAGNOSIS — R262 Difficulty in walking, not elsewhere classified: Secondary | ICD-10-CM

## 2021-05-19 DIAGNOSIS — R2689 Other abnormalities of gait and mobility: Secondary | ICD-10-CM

## 2021-05-19 DIAGNOSIS — I69354 Hemiplegia and hemiparesis following cerebral infarction affecting left non-dominant side: Secondary | ICD-10-CM | POA: Insufficient documentation

## 2021-05-19 DIAGNOSIS — I610 Nontraumatic intracerebral hemorrhage in hemisphere, subcortical: Secondary | ICD-10-CM | POA: Insufficient documentation

## 2021-05-19 DIAGNOSIS — R2681 Unsteadiness on feet: Secondary | ICD-10-CM

## 2021-05-19 NOTE — Therapy (Signed)
[facility] [address] ?[place], [place], [address] ?Phone: [phone]   Fax:  [phone] ? ?Physical Therapy Evaluation ? ?Patient Details  ?Name: Miguel Mccullough ?MRN: [id number] ?Date of Birth: 04/01/1965 ?Referring Provider (PT): Megan Lovorn ? ? ?Encounter Date: 05/19/2021 ? ? PT End of Session - 05/19/21 1708   ? ? Visit Number 1   ? Number of Visits 10   ? Date for PT Re-Evaluation 07/28/21   ? Authorization Type Self pay   ? Authorization Time Period 05/19/21 to 07/28/21   ? PT Start Time 2979   ? PT Stop Time 1702   ? PT Time Calculation (min) 45 min   ? Activity Tolerance Patient tolerated treatment well   ? Behavior During Therapy [facility] for tasks assessed/performed   ? ?  ?  ? ?  ? ? ?Past Medical History:  ?Diagnosis Date  ? COVID-19 2021  ? continues to have increase in suptum production.  ? Hypertension   ? ? ?History reviewed. No pertinent surgical history. ? ?There were no vitals filed for this visit. ? ? ? Subjective Assessment - 05/19/21 1618   ? ? Subjective I had a stroke about a month ago, went to [facility] and just got home on Thursday. We're still figuring out things at home. Space is the biggest issue at home. I have scoliosis. No falls, did have one close call when I was trying to move too fast. Skin is doing well with AFO.   ? Patient Stated Goals be able to walk again, get left leg moving   ? Currently in Pain? No/denies   ? ?  ?  ? ?  ? ? ? ? ? [facility] PT Assessment - 05/19/21 0001   ? ?  ? Assessment  ? Medical Diagnosis CVA   ? Referring Provider (PT) Megan Lovorn   ? Onset Date/Surgical Date --   about a month ago  ? Next MD Visit Dr. Darnelle Spangle sometime this week   ? Prior Therapy PT in [facility]   ?  ? Precautions  ? Precautions Other (comment)   ? Precaution Comments left side weakness/hemiparesis   ?  ? Restrictions  ? Weight Bearing Restrictions No   ?  ? Balance Screen  ? Has the patient fallen in the past 6 months No   ? Has the patient had a decrease  in activity level because of a fear of falling?  No   ? Is the patient reluctant to leave their home because of a fear of falling?  No   ?  ? Home Environment  ? Living Environment Private residence   ?  ? Prior Function  ? Level of Independence Needs assistance with gait;Needs assistance with transfers;Needs assistance with ADLs;Needs assistance with homemaking   ? Vocation Full time employment   ? Vocation Requirements owns own cleaning service   ? Leisure working out   ?  ? Observation/Other Assessments  ? Observations L LE very weak, little to no tone noted   ?  ? ROM / Strength  ? AROM / PROM / Strength Strength   ?  ? Strength  ? Strength Assessment Site Hip;Knee;Ankle   ? Right/Left Hip Left;Right   ? Right Hip Flexion 4+/5   ? Left Hip Flexion 2-/5   ? Right/Left Knee Right;Left   ? Right Knee Flexion 4/5   ? Right Knee Extension 5/5   ? Left Knee Flexion 1/5   ?  Left Knee Extension 2-/5   ? Right/Left Ankle Left;Right   ? Right Ankle Dorsiflexion 5/5   ? Left Ankle Dorsiflexion 0/5   ?  ? Bed Mobility  ? Bed Mobility Sit to Supine   ? Rolling Right Minimal Assistance - Patient > 75%   ? Rolling Left Contact Guard/Touching assist   ? Supine to Sit Minimal Assistance - Patient > 75%   ? Sit to Supine Moderate Assistance - Patient 50-74%   ?  ? Transfers  ? Transfers Sit to Stand;Stand to Sit;Supine to Sit;Sit to Supine;Stand Pivot Transfers   ? Sit to Stand 4: Min assist   ? Stand to Sit 4: Min guard   ? Stand Pivot Transfers 3: Mod assist   ? Comments tends to overshift to the R side in standing, hard R lean and needs cues to correct; L inattention increases significantly in standing   ?  ? Ambulation/Gait  ? Gait Comments in // bars; hard R lean away from L LE and needed cues to correct; MinA to walk 53ft in // bars with U UE support. With hemiwalker, able to gait train about 25-77ft with MinA for balance and control of L LE/placement at end of swing phase. Limited lateral weight shift. Had LOB requiring  heavy MinA to correct.   ? ?  ?  ? ?  ? ? ? ? ? ? ? ? ? ? ? ? ? ?Objective measurements completed on examination: See above findings.  ? ? ? ? ? ? ? ? ? ? ? ? ? ? PT Education - 05/19/21 1707   ? ? Education Details exam findings, POC, finding midline in mirror at home in sitting and standing   ? Person(s) Educated Patient   ? Methods Explanation   ? Comprehension Verbalized understanding;Returned demonstration   ? ?  ?  ? ?  ? ? ? PT Short Term Goals - 05/19/21 1728   ? ?  ? PT SHORT TERM GOAL #1  ? Title Will be able to perform bed mobility with no more than S level assist   ? Time 5   ? Period Weeks   ? Status New   ? Target Date 06/23/21   ?  ? PT SHORT TERM GOAL #2  ? Title Will be able to perform functional stand and squat pivot transfers with no more than S level assist   ? Time 5   ? Period Weeks   ? Status New   ?  ? PT SHORT TERM GOAL #3  ? Title Will be able to maintain static standing for at least 5 minutes in complete midline with equal WB each LE   ? Time 5   ? Period Weeks   ? Status New   ?  ? PT SHORT TERM GOAL #4  ? Title Will appropriately attend to and recognize visual stimulation on L side on 4/5 attempts to show improving L inattention   ? Time 5   ? Period Weeks   ? Status New   ?  ? PT SHORT TERM GOAL #5  ? Title Will be able to gait train at least 51ft with LRAD and no more than min guard assist   ? Time 5   ? Period Weeks   ? Status New   ? ?  ?  ? ?  ? ? ? ? PT Long Term Goals - 05/19/21 1736   ? ?  ? PT LONG TERM GOAL #1  ?  Title MMT to improve by 1 grade in all weak groups in BLEs   ? Time 10   ? Period Weeks   ? Status New   ? Target Date 07/28/21   ?  ? PT LONG TERM GOAL #2  ? Title Will be able to gait train at least 173ft with LRAD and no more than distant S assist   ? Time 10   ? Period Weeks   ? Status New   ?  ? PT LONG TERM GOAL #3  ? Title Will be able to navigate at least 4 steps with U rail and no more than Min guard assistance   ? Time 10   ? Period Weeks   ? Status New   ?   ? PT LONG TERM GOAL #4  ? Title Will be able to perform all bed mobility and functional transfers on a Mod(I) level with LRAD   ? Time 10   ? Period Weeks   ? Status New   ?  ? PT LONG TERM GOAL #5  ? Title Will be able to complete TUG in 25 seconds or less with LRAD to show improved functional balance   ? Time 10   ? Period Weeks   ? Status New   ? ?  ?  ? ?  ? ? ? ? ? ? ? ? ? Plan - 05/19/21 1710   ? ? Clinical Impression Statement Smitty arrives today after having had a major CVA in late February; he was admitted to [facility] and was just released last Thursday. Exam is typical for someone post-CVA, including deficits in functional mobility and gait pattern, significant L sided inattention and hemiparesis, impaired safety awareness, and impaired gross functional activity tolerance. Definitely in need of skilled PT services to address functional impairments and reduce fall risk.   ? Personal Factors and Comorbidities Finances;Comorbidity 2;Age   ? Examination-Activity Limitations Locomotion Level;Transfers;Bed Mobility;Sit;Squat;Stairs;Stand   ? Examination-Participation Restrictions Community Activity;Driving;Shop;Yard Work   ? Stability/Clinical Decision Making Evolving/Moderate complexity   ? Clinical Decision Making Moderate   ? Rehab Potential Good   ? PT Frequency 1x / week   ? PT Duration Other (comment)   10w  ? PT Treatment/Interventions ADLs/Self Care Home Management;Biofeedback;Cryotherapy;Electrical Stimulation;DME Instruction;Gait training;Stair training;Functional mobility training;Therapeutic activities;Therapeutic exercise;Balance training;Neuromuscular re-education;Cognitive remediation;Patient/family education;Wheelchair mobility training;Manual techniques;Energy conservation;Taping;Visual/perceptual remediation/compensation   ? PT Next Visit Plan needs more specific HEP; work on functional mobility in general, really need to focus on improving weight shift over L LE and L attention   ? PT Home  Exercise Plan sitting/stanidng in midline at home with family/RW using mirror   ? Consulted and Agree with Plan of Care Patient   ? ?  ?  ? ?  ? ? ?Patient will benefit from skilled therapeutic intervention in o

## 2021-05-20 ENCOUNTER — Other Ambulatory Visit: Payer: Self-pay

## 2021-05-20 ENCOUNTER — Ambulatory Visit: Payer: Self-pay | Attending: Internal Medicine | Admitting: Internal Medicine

## 2021-05-20 ENCOUNTER — Encounter: Payer: Self-pay | Admitting: Internal Medicine

## 2021-05-20 VITALS — BP 144/78 | HR 61 | Resp 16

## 2021-05-20 DIAGNOSIS — I10 Essential (primary) hypertension: Secondary | ICD-10-CM

## 2021-05-20 DIAGNOSIS — E876 Hypokalemia: Secondary | ICD-10-CM

## 2021-05-20 DIAGNOSIS — N1832 Chronic kidney disease, stage 3b: Secondary | ICD-10-CM

## 2021-05-20 DIAGNOSIS — F320 Major depressive disorder, single episode, mild: Secondary | ICD-10-CM

## 2021-05-20 DIAGNOSIS — Z7689 Persons encountering health services in other specified circumstances: Secondary | ICD-10-CM

## 2021-05-20 DIAGNOSIS — I6930 Unspecified sequelae of cerebral infarction: Secondary | ICD-10-CM

## 2021-05-20 MED ORDER — AMLODIPINE BESYLATE 10 MG PO TABS
10.0000 mg | ORAL_TABLET | Freq: Every day | ORAL | 6 refills | Status: DC
Start: 2021-05-20 — End: 2021-12-30
  Filled 2021-05-20 – 2021-06-09 (×2): qty 30, 30d supply, fill #0
  Filled 2021-07-09: qty 30, 30d supply, fill #1
  Filled 2021-08-07: qty 30, 30d supply, fill #2
  Filled 2021-08-31: qty 30, 30d supply, fill #3
  Filled 2021-10-06: qty 30, 30d supply, fill #4
  Filled 2021-10-30: qty 30, 30d supply, fill #5
  Filled 2021-11-25 – 2021-11-27 (×2): qty 30, 30d supply, fill #6

## 2021-05-20 MED ORDER — TRAZODONE HCL 150 MG PO TABS
75.0000 mg | ORAL_TABLET | Freq: Every day | ORAL | 6 refills | Status: DC
  Filled 2021-05-20: qty 30, 60d supply, fill #0
  Filled 2021-06-09: qty 15, 30d supply, fill #0
  Filled 2021-07-09: qty 15, 30d supply, fill #1
  Filled 2021-08-07: qty 15, 30d supply, fill #2
  Filled 2021-08-31: qty 15, 30d supply, fill #3
  Filled 2021-10-06: qty 15, 30d supply, fill #4
  Filled 2021-10-30: qty 12, 24d supply, fill #5
  Filled 2021-10-30: qty 15, 30d supply, fill #5
  Filled 2021-10-30: qty 3, 6d supply, fill #5
  Filled 2021-11-25 – 2021-11-27 (×2): qty 15, 30d supply, fill #6
  Filled 2021-12-30: qty 15, 30d supply, fill #7
  Filled 2022-01-25: qty 15, 30d supply, fill #8
  Filled 2022-02-25 (×2): qty 15, 30d supply, fill #9
  Filled 2022-03-28 – 2022-03-30 (×2): qty 15, 30d supply, fill #10
  Filled 2022-04-20: qty 15, 30d supply, fill #11
  Filled 2022-05-18 (×2): qty 15, 30d supply, fill #12

## 2021-05-20 MED ORDER — POTASSIUM CHLORIDE CRYS ER 20 MEQ PO TBCR
20.0000 meq | EXTENDED_RELEASE_TABLET | Freq: Two times a day (BID) | ORAL | 4 refills | Status: DC
  Filled 2021-05-20 – 2021-06-09 (×2): qty 60, 30d supply, fill #0
  Filled 2021-07-09: qty 60, 30d supply, fill #1

## 2021-05-20 MED ORDER — METOPROLOL TARTRATE 50 MG PO TABS
75.0000 mg | ORAL_TABLET | Freq: Two times a day (BID) | ORAL | 6 refills | Status: DC
  Filled 2021-05-20 – 2021-06-09 (×2): qty 90, 30d supply, fill #0
  Filled 2021-07-09: qty 90, 30d supply, fill #1
  Filled 2021-08-07: qty 90, 30d supply, fill #2
  Filled 2021-08-31: qty 90, 30d supply, fill #3
  Filled 2021-10-06: qty 90, 30d supply, fill #4
  Filled 2021-10-30: qty 90, 30d supply, fill #5
  Filled 2021-11-25 – 2021-11-27 (×2): qty 90, 30d supply, fill #6

## 2021-05-20 MED ORDER — PANTOPRAZOLE SODIUM 40 MG PO TBEC
40.0000 mg | DELAYED_RELEASE_TABLET | Freq: Every day | ORAL | 1 refills | Status: DC
  Filled 2021-05-20 – 2021-06-09 (×2): qty 30, 30d supply, fill #0
  Filled 2021-07-09: qty 30, 30d supply, fill #1

## 2021-05-20 MED ORDER — METHYLPHENIDATE HCL 5 MG PO TABS
2.5000 mg | ORAL_TABLET | Freq: Every day | ORAL | 0 refills | Status: DC
  Filled 2021-05-20: qty 15, 30d supply, fill #0

## 2021-05-20 MED ORDER — ATORVASTATIN CALCIUM 40 MG PO TABS
40.0000 mg | ORAL_TABLET | Freq: Every day | ORAL | 6 refills | Status: DC
  Filled 2021-05-20 – 2021-06-09 (×2): qty 30, 30d supply, fill #0
  Filled 2021-07-09: qty 30, 30d supply, fill #1
  Filled 2021-08-07: qty 30, 30d supply, fill #2
  Filled 2021-08-31: qty 30, 30d supply, fill #3
  Filled 2021-10-06: qty 30, 30d supply, fill #4
  Filled 2021-10-30: qty 30, 30d supply, fill #5
  Filled 2021-11-25 – 2021-11-27 (×3): qty 30, 30d supply, fill #6

## 2021-05-20 MED ORDER — SENNOSIDES-DOCUSATE SODIUM 8.6-50 MG PO TABS
2.0000 | ORAL_TABLET | Freq: Every evening | ORAL | 3 refills | Status: DC | PRN
  Filled 2021-05-20 – 2021-07-09 (×2): qty 60, 30d supply, fill #0
  Filled 2021-12-30: qty 100, 50d supply, fill #0

## 2021-05-20 MED ORDER — CITALOPRAM HYDROBROMIDE 20 MG PO TABS
20.0000 mg | ORAL_TABLET | Freq: Every day | ORAL | 3 refills | Status: DC
  Filled 2021-05-20 – 2021-06-09 (×2): qty 30, 30d supply, fill #0
  Filled 2021-07-09: qty 30, 30d supply, fill #1
  Filled 2021-08-07: qty 30, 30d supply, fill #2
  Filled 2021-08-31: qty 30, 30d supply, fill #3

## 2021-05-20 MED ORDER — CLONIDINE 0.3 MG/24HR TD PTWK
0.3000 mg | MEDICATED_PATCH | TRANSDERMAL | 6 refills | Status: DC
  Filled 2021-05-20 – 2021-06-09 (×2): qty 4, 28d supply, fill #0
  Filled 2021-07-09: qty 4, 28d supply, fill #1
  Filled 2021-08-07: qty 4, 28d supply, fill #2
  Filled 2021-08-31: qty 4, 28d supply, fill #3
  Filled 2021-10-06: qty 4, 28d supply, fill #4
  Filled 2021-10-30: qty 4, 28d supply, fill #5
  Filled 2021-11-25 – 2021-11-27 (×2): qty 4, 28d supply, fill #6

## 2021-05-20 MED ORDER — HYDRALAZINE HCL 100 MG PO TABS
100.0000 mg | ORAL_TABLET | Freq: Three times a day (TID) | ORAL | 6 refills | Status: DC
  Filled 2021-05-20: qty 90, 30d supply, fill #0

## 2021-05-20 MED ORDER — BACLOFEN 10 MG PO TABS
10.0000 mg | ORAL_TABLET | Freq: Three times a day (TID) | ORAL | 6 refills | Status: DC
  Filled 2021-05-20 – 2021-06-09 (×2): qty 90, 30d supply, fill #0
  Filled 2021-07-09: qty 90, 30d supply, fill #1
  Filled 2021-08-07: qty 90, 30d supply, fill #2
  Filled 2021-08-31: qty 90, 30d supply, fill #3
  Filled 2021-10-06: qty 90, 30d supply, fill #4
  Filled 2021-10-30: qty 90, 30d supply, fill #5
  Filled 2021-11-25 – 2021-11-27 (×2): qty 90, 30d supply, fill #6

## 2021-05-20 NOTE — Progress Notes (Signed)
- ? ? ?Patient ID: Miguel Mccullough, male    DOB: 1966/02/14  MRN: [id number] ? ?CC: Hospitalization Follow-up ? ? ?Subjective: ?Miguel Mccullough is a 56 y.o. male who presents for new patient visit and hospital follow-up.  His wife [name] is with him. 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 ?No previous PCP. ?Patient was hospitalized 2/26-04/17/2021 with slurred speech, left-sided numbness and weakness and accelerated hypertension.  CT of the head revealed a right basal ganglia IPH with intraventricular extension. ?CTA head and neck no significant stenosis, occlusion, aneurysm or dissection. ?MRI revealed right basal ganglia hematoma with intraventricular extension stable from follow-up CT. ?2D echo: LVEF 70 to 75%, severe LVH, grade 2 DD, LAE mild to moderate dilation ?A1c 7.4, LDL 74 ?Found to have acute kidney injury.  No prior labs available for comparison.  Renal ultrasound negative. ?Patient transferred to inpatient rehab from 3/2-30/2023 where he underwent PT/OT and speech therapy.  Blood pressure remained labile.  He was on 4 blood pressure medications including Catapres patch.  Supplemented with potassium due to hypokalemia.  He required AFO for LLE to help with gait quality.  Plan was for follow-up with PT/OT/ST on discharge.  [facility] agreed to provide charity care for a few weeks. ? ?Today: ?Patient's wife states that they have been called by [facility] but they have not returned the call as yet. ?Patient has manual wheelchair which he is in today, a walker, bedside commode and shower bench.  Then bedroom was on the second floor but due to his left hemiparesis, he is limited to the first floor bedroom now.  Uses the walker but needs stand-by to hands on assist from his wife.  Had a controlled fall this past weekend with no injuries.  His foot got caught in something causing the fall.  He has had 1 session of  physical therapy so far yesterday.  He will also be doing OT and ST.  On Celexa for mood stabilization.  Feels he is doing well with this.  Admits to mild depression especially initially.  On trazodone to help with sleep.  Reports that he is sleeping and eating well.  On low-dose Ritalin because he struggles to stay alert.  Doing well on the low-dose Ritalin.  He has applied for Medicaid.  Wife states that they have been working with someone from for source.  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 ? ?HTN: Reports compliance with taking his medications that include clonidine patch, hydralazine, amlodipine, and metoprolol.  SBP at home has been running in the 150s. ? ?Patient Active Problem List  ? Diagnosis Date Noted  ? Accelerated hypertension 05/15/2021  ? Acute renal failure ([place]) 05/15/2021  ? Elevated lipids 05/15/2021  ? Spasticity 05/15/2021  ? Depression 05/15/2021  ? Constipation 05/15/2021  ? ICH (intracerebral hemorrhage) ([place]) 04/13/2021  ?  ? ?Current Outpatient Medications on File Prior to Visit  ?Medication Sig Dispense Refill  ? acetaminophen (TYLENOL) 325 MG tablet Take 1-2 tablets (325-650 mg total) by mouth every 4 (four) hours as needed for mild pain. 100 tablet 0  ? amantadine (SYMMETREL) 100 MG capsule Take 1 capsule (100 mg total) by mouth daily. 30 capsule 0  ? amLODipine (NORVASC) 10 MG tablet Take 1 tablet (10 mg total) by mouth at bedtime. 30 tablet 0  ? atorvastatin (LIPITOR) 40 MG tablet Take 1 tablet (40  mg total) by mouth at bedtime. 30 tablet 0  ? baclofen (LIORESAL) 10 MG tablet Take 1 tablet (10 mg total) by mouth 3 (three) times daily. 90 each 0  ? citalopram (CELEXA) 20 MG tablet Take 1 tablet (20 mg total) by mouth daily. 30 tablet 0  ? cloNIDine (CATAPRES - DOSED IN MG/24 HR) 0.3 mg/24hr patch Place 1 patch (0.3 mg total) onto the skin once a week. 4 patch 0  ? fluticasone (FLONASE) 50 MCG/ACT nasal spray Place 1 spray into both nostrils daily. 16 g 0   ? hydrALAZINE (APRESOLINE) 100 MG tablet Take 1 tablet (100 mg total) by mouth every 8 (eight) hours. 90 tablet 0  ? hydrocortisone cream 1 % Apply topically 3 (three) times daily. 28 g 0  ? magnesium oxide (MAG-OX) 400 MG tablet Take 1 tablet (400 mg total) by mouth daily. 30 tablet 0  ? methylphenidate (RITALIN) 5 MG tablet Take 0.5 tablets (2.5 mg total) by mouth daily. 15 tablet 0  ? metoprolol tartrate (LOPRESSOR) 50 MG tablet Take 1 & 1/2 tablets (75 mg total) by mouth 2 (two) times daily. 90 tablet 0  ? pantoprazole (PROTONIX) 40 MG tablet Take 1 tablet (40 mg total) by mouth at bedtime. 30 tablet 0  ? potassium chloride SA (KLOR-CON M) 20 MEQ tablet Take 1 tablet (20 mEq total) by mouth 2 (two) times daily. 60 tablet 0  ? senna-docusate (SENOKOT-S) 8.6-50 MG tablet Take 2 tablets by mouth daily after supper. 60 tablet 0  ? traZODone (DESYREL) 150 MG tablet Take 0.5 tablets (75 mg total) by mouth at bedtime. 30 tablet 0  ? ?No current facility-administered medications on file prior to visit.  ? ? ?No Known Allergies ? ?Social History  ? ?Socioeconomic History  ? Marital status: Married  ?  Spouse name: Not on file  ? Number of children: Not on file  ? Years of education: Not on file  ? Highest education level: Not on file  ?Occupational History  ? Not on file  ?Tobacco Use  ? Smoking status: Never  ? Smokeless tobacco: Never  ?Vaping Use  ? Vaping Use: Never used  ?Substance and Sexual Activity  ? Alcohol use: Never  ? Drug use: Never  ? Sexual activity: Not Currently  ?Other Topics Concern  ? Not on file  ?Social History Narrative  ? Not on file  ? ?Social Determinants of Health  ? ?Financial Resource Strain: Not on file  ?Food Insecurity: Not on file  ?Transportation Needs: Not on file  ?Physical Activity: Not on file  ?Stress: Not on file  ?Social Connections: Not on file  ?Intimate Partner Violence: Not on file  ? ? ?Family History  ?Problem Relation Age of Onset  ? Hypertension Mother   ? Hypertension  Father   ? Hypertension Sister   ? Hypertension Brother   ? ? ?No past surgical history on file. ? ?ROS: ?Review of Systems ?Negative except as stated above ? ?PHYSICAL EXAM: ?BP (!) 144/78   Pulse 61   Resp 16   SpO2 96%   ?Physical Exam ? ? ?General appearance - alert, well appearing, middle-age African-American male sitting in wheelchair and in no distress ?Mental status - normal mood, behavior, speech, dress, motor activity, and thought processes ?Mouth - mucous membranes moist, pharynx normal without lesions ?Neck - supple, no significant adenopathy ?Chest - clear to auscultation, no wheezes, rales or rhonchi, symmetric air entry ?Heart - normal rate, regular rhythm, normal S1,  S2, no murmurs, rubs, clicks or gallops ?Neurological -patient with hemiparesis of the left upper extremity.  Grip on this side is 0/5.  He has some increased spasticity in the left upper extremity.  He is wearing his AFO on the left lower extremity.  Power in this left lower extremity is minimal. ?Extremities -trace edema in the right lower extremity. ? ? ?  Latest Ref Rng & Units 05/14/2021  ?  5:09 AM 05/12/2021  ?  6:13 AM 05/05/2021  ?  8:22 AM  ?CMP  ?Glucose 70 - 99 mg/dL 83   84   153    ?BUN 6 - 20 mg/dL 20   17   17     ?Creatinine 0.61 - 1.24 mg/dL 2.00   1.86   2.07    ?Sodium 135 - 145 mmol/L 141   142   139    ?Potassium 3.5 - 5.1 mmol/L 3.9   3.2   3.5    ?Chloride 98 - 111 mmol/L 105   106   105    ?CO2 22 - 32 mmol/L 27   28   23     ?Calcium 8.9 - 10.3 mg/dL 9.0   8.9   9.2    ? ?Lipid Panel  ?   ?Component Value Date/Time  ? CHOL 203 (H) 04/13/2021 2247  ? TRIG 78 04/16/2021 0750  ? HDL 59 04/13/2021 2247  ? CHOLHDL 3.4 04/13/2021 2247  ? VLDL 50 (H) 04/13/2021 2247  ? [place] 94 04/13/2021 2247  ? ? ?CBC ?   ?Component Value Date/Time  ? WBC 7.6 05/12/2021 0613  ? RBC 3.93 (L) 05/12/2021 4287  ? HGB 11.4 (L) 05/12/2021 6811  ? HCT 33.9 (L) 05/12/2021 5726  ? PLT 210 05/12/2021 0613  ? MCV 86.3 05/12/2021 0613  ? MCH  29.0 05/12/2021 0613  ? MCHC 33.6 05/12/2021 0613  ? RDW 13.0 05/12/2021 0613  ? LYMPHSABS 1.8 04/29/2021 0537  ? MONOABS 0.6 04/29/2021 0537  ? EOSABS 0.4 04/29/2021 0537  ? BASOSABS 0.1 04/29/2021 0537  ? ?

## 2021-05-21 ENCOUNTER — Encounter: Payer: Self-pay | Admitting: Internal Medicine

## 2021-05-22 ENCOUNTER — Ambulatory Visit: Payer: Self-pay | Admitting: Speech Pathology

## 2021-05-22 ENCOUNTER — Ambulatory Visit: Payer: Self-pay | Admitting: Occupational Therapy

## 2021-05-22 ENCOUNTER — Encounter: Payer: Self-pay | Admitting: Speech Pathology

## 2021-05-22 ENCOUNTER — Encounter: Payer: Self-pay | Admitting: Occupational Therapy

## 2021-05-22 DIAGNOSIS — R41841 Cognitive communication deficit: Secondary | ICD-10-CM

## 2021-05-22 DIAGNOSIS — R41840 Attention and concentration deficit: Secondary | ICD-10-CM

## 2021-05-22 DIAGNOSIS — R2689 Other abnormalities of gait and mobility: Secondary | ICD-10-CM

## 2021-05-22 DIAGNOSIS — R278 Other lack of coordination: Secondary | ICD-10-CM

## 2021-05-22 DIAGNOSIS — I69354 Hemiplegia and hemiparesis following cerebral infarction affecting left non-dominant side: Secondary | ICD-10-CM

## 2021-05-22 NOTE — Therapy (Signed)
?OUTPATIENT OCCUPATIONAL THERAPY NEURO EVALUATION ? ?Patient Name: Miguel Mccullough ?MRN: [id number] ?DOB:1965/09/15, 56 y.o., male ?Today's Date: 05/23/2021 ? ?PCP: Pcp, No ?REFERRING PROVIDER: Courtney Heys, MD ? ? OT End of Session - 05/23/21 1107   ? ? Visit Number 1   ? Date for OT Re-Evaluation 08/20/21   ? Authorization Type self pay; has applied for Medicaid   ? OT Start Time 1615   ? OT Stop Time 1655   ? OT Time Calculation (min) 40 min   ? Activity Tolerance Patient tolerated treatment well   ? Behavior During Therapy [facility] for tasks assessed/performed   ? ?  ?  ? ?  ? ?Past Medical History:  ?Diagnosis Date  ? COVID-19 2021  ? continues to have increase in suptum production.  ? Hypertension   ? ?History reviewed. No pertinent surgical history. ?Patient Active Problem List  ? Diagnosis Date Noted  ? Stage 3b chronic kidney disease ([place]) 05/20/2021  ? Hypokalemia 05/20/2021  ? Mild major depression ([place]) 05/20/2021  ? Accelerated hypertension 05/15/2021  ? Acute renal failure ([place]) 05/15/2021  ? Elevated lipids 05/15/2021  ? Spasticity 05/15/2021  ? Depression 05/15/2021  ? Constipation 05/15/2021  ? ICH (intracerebral hemorrhage) ([place]) 04/13/2021  ? ? ?ONSET DATE: 04/13/21 ? ?REFERRING DIAG: R CVA ? ?THERAPY DIAG:  ?Hemiplegia and hemiparesis following cerebral infarction affecting left non-dominant side ([place]) ? ?Attention and concentration deficit ? ?Other abnormalities of gait and mobility ? ?Cognitive communication deficit ? ?Other lack of coordination ? ?SUBJECTIVE:  ? ?SUBJECTIVE STATEMENT: ?Pt arrives to session today w/ primary concerns related to L-sided weakness and decreased level of independence s/p CVA in February. Pt reports he has been trying to do his exercises since being home and still requires significant help w/ ADLs. Pt also states he has received a lot of appropriate DME by posting needs on social media and receiving donations from his community. ?Pt accompanied by: self ? 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 Transferred to [facility] 3/2-3/30/23. PMH includes stage III CKD, depression, and HTN ? ?PRECAUTIONS: Fall ? ?WEIGHT BEARING RESTRICTIONS No ? ?PAIN:  ?Are you having pain? No ? ?FALLS: Has patient fallen in last 6 months? No ? ?LIVING ENVIRONMENT: ?Lives with: lives with their family ?Lives in: House/apartment ?Stairs: Yes: Internal: 18 steps; on right going up and External: 5 to garage steps; can reach both ?Has following equipment at home: Quad cane small base, Environmental consultant - 2 wheeled, Wheelchair (manual), Shower bench, and bed side commode ? ?PLOF: Independent; owns cleans services ? ?PATIENT GOALS: Be as independent as possible ? ?OBJECTIVE:  ? ?HAND DOMINANCE: Right ? ?ADLs: ?Overall ADLs: Mod-Max A w/ BADLs and functional transitions/transfers ?Transfers/ambulation related to ADLs: Requires assist ?Eating: Unable to cut tough food ?Grooming: Mod I ?UB Dressing: Mod A; requires assist w/ clothing fasteners ?LB Dressing: Mod A; requires assist w/ clothing fasteners ?Toileting: Mod A ?Bathing: Bedside bathing currently due to limited bathroom accessibility ?Tub Shower transfers: Max A; "80%" ?Equipment: Transfer tub bench and Walk in shower ? ? ?IADLs: ?Shopping: Unable to complete ?Light housekeeping: Unable to complete ?Meal Prep: Simple snack prep; able to cut soft foods he feels like he's able to control ?Community mobility: Dependent ?Medication management: Min A ?Financial management: Wife is completing at this time ?Handwriting: 100% legible ? ?MOBILITY STATUS: Needs Assist: Min A w/ assistive device per PT evaluation ? ? ?  FUNCTIONAL OUTCOME MEASURES: ?FOTO: TBA ? ? ?UE MMT:    ? ?MMT Right ?05/23/2021 Left ?05/23/2021  ?Shoulder flexion  1/5  ?Shoulder abduction    ?Shoulder adduction    ?Shoulder extension  1/5  ?Shoulder internal rotation    ?Shoulder external rotation     ?Middle trapezius  1/5  ?Elbow flexion  0/5  ?Elbow extension  2-/5  ?Wrist flexion  0/5  ?Wrist extension  0/5  ?Wrist ulnar deviation    ?Wrist radial deviation    ?Wrist pronation    ?Wrist supination    ?(Blank rows = not tested) ? ?HAND FUNCTION: ?No gross grasp/release at this time ? ?COORDINATION: ?Unable to assess; no digit activation at this time ? ?SENSATION: ?Light touch: WFL ?Hot/Cold: WFL ?Proprioception: Impaired  ? ?MUSCLE TONE: LUE: Modifed Ashworth Scale 1 = Slight increase in muscle tone, manifested by a catch and release or by minimal resistance at the end of the range of motion when the affected part(s) is moved in flexion or extension ? ?COGNITION: ?Overall cognitive status: Impaired: Areas of impairment: Attention, Memory, Safety/judgement, Awareness, and Problem solving  ? ?VISION: ?Subjective report: Reports no concerns at this time; denies dizziness, headaches, blurred vision, diplopia ?Baseline vision: Wears glasses all the time ? ?VISION ASSESSMENT: ?Not tested; to be assessed ? ?Patient has difficulty with following activities due to following visual impairments: TBA ? ?PERCEPTION: Impaired: Inattention/neglect: decreased attention to L side of body ? ?PRAXIS: Not tested ? ? ?PATIENT EDUCATION: ?Education details: Education provided on role and purpose of OT, as well as potential interventions and goals for therapy based on initial evaluation findings. ?Person educated: Patient ?Education method: Explanation ?Education comprehension: verbalized understanding ? ? ?HOME EXERCISE PROGRAM: ?To be administered; currently completing  ? ? ?GOALS: ?Goals reviewed with patient? Yes ? ?SHORT TERM GOALS: Target date: 06/27/2021 ? ?STG  Baseline Goal Status  ?1 Pt will demonstrate independence w/ initial HEP designed for NMR and stretching of LUE N/A INITIAL  ?2 Pt will be able to complete a task in standing for at least 5 min at CGA to improve participation in IADL tasks Decreased standing  tolerance/balance INITIAL  ?3 Pt will be able to cut food w/ Mod I, using AE prn Unable to cut tough food INITIAL  ? ?LONG TERM GOALS: Target date: 08/01/2021 ? ?LTG  Baseline Goal Status  ?1 Pt will be able to complete LB dressing at setup assist, incorporating compensatory strategies/AE prn Mod A INITIAL  ?2 Pt will be able to complete meal prep activity w/ Mod I by d/c Able to complete simple snack prep INITIAL  ?3 Pt will demonstrate completion of simulated medication management activity at [facility] for safety Min A INITIAL  ?4 Pt will be able to safely complete simulated tub/shower transfer at Mod A Max A INITIAL  ?5 FOTO goal -- to be assessed TBD INITIAL  ? ?ASSESSMENT: ? ?CLINICAL IMPRESSION: ?Pt is a 56 y/o male who presents to OP OT due to R basal ganglia IPH w/ L-sided hemiplegia. PMH includes stage 3b CKD and HTN. Pt currently lives with his wife in a 2-level home, but is able to live on the main level. Pt was self-employed for his cleaning business prior to onset. Pt will benefit from skilled occupational therapy services to address neuromuscular reeducation of L hemi body, GMC, altered sensation, balance and functional mobility, L-sided inattention, safety awareness, and introduction of compensatory strategies/AE prn, visual-perception to decreased caregiver strain and improve participation, safety, and overall  independence during ADLs and IADLs. ? ?PERFORMANCE DEFICITS in functional skills including ADLs, IADLs, coordination, dexterity, proprioception, sensation, edema, tone, ROM, strength, FMC, GMC, mobility, balance, body mechanics, decreased knowledge of precautions, and decreased knowledge of use of DME, cognitive skills including attention, memory, perception, problem solving, safety awareness, and sequencing, and psychosocial skills including environmental adaptation.  ? ?IMPAIRMENTS are limiting patient from ADLs, IADLs, rest and sleep, work, leisure, and social participation.  ? ?COMORBIDITIES may  have co-morbidities  that affects occupational performance. Patient will benefit from skilled OT to address above impairments and improve overall function. ? ?MODIFICATION OR ASSISTANCE TO COMPLETE EVALUATION: Min-Mode

## 2021-05-22 NOTE — Therapy (Signed)
?OUTPATIENT SPEECH LANGUAGE PATHOLOGY EVALUATION ? ? ?Patient Name: Miguel Mccullough ?MRN: [id number] ?DOB:29-Apr-1965, 56 y.o., male ?Today's Date: 05/23/2021 ? ?PCP: Pcp, No ?REFERRING PROVIDER: Courtney Heys, MD ? ? End of Session - 05/23/21 1022   ? ? Visit Number 1   ? Number of Visits 17   ? Date for SLP Re-Evaluation 07/23/21   ? SLP Start Time 1537   ? SLP Stop Time  1615   ? SLP Time Calculation (min) 38 min   ? Activity Tolerance Patient tolerated treatment well   ? ?  ?  ? ?  ? ? ?Past Medical History:  ?Diagnosis Date  ? COVID-19 2021  ? continues to have increase in suptum production.  ? Hypertension   ? ?History reviewed. No pertinent surgical history. ?Patient Active Problem List  ? Diagnosis Date Noted  ? Stage 3b chronic kidney disease ([place]) 05/20/2021  ? Hypokalemia 05/20/2021  ? Mild major depression ([place]) 05/20/2021  ? Accelerated hypertension 05/15/2021  ? Acute renal failure ([place]) 05/15/2021  ? Elevated lipids 05/15/2021  ? Spasticity 05/15/2021  ? Depression 05/15/2021  ? Constipation 05/15/2021  ? ICH (intracerebral hemorrhage) ([place]) 04/13/2021  ? ? ?ONSET DATE: 04/13/21  ? ?REFERRING DIAG: ICH, I61.9 ? ?THERAPY DIAG:  ?Cognitive communication deficit ? ?SUBJECTIVE:  ? ?SUBJECTIVE STATEMENT: ? ?Pt was pleasant and cooperative throughout evaluation. ? ?Pt accompanied by: self ? ?PERTINENT HISTORY: HTN, RCVA ? ?PAIN:  ?Are you having pain? No ? ? ?FALLS: Has patient fallen in last 6 months?  No ? ?LIVING ENVIRONMENT: ?Lives with: lives with their family and lives with their spouse, [name] ?Lives in: House/apartment ? ?PLOF:  ?Level of assistance: Independent with ADLs, Independent with IADLs ?Employment: Producer, television/film/video, Engineer, production  ? ? ?PATIENT GOALS To get back to work and increase independence with daily tasks.  ? ?OBJECTIVE:  ? ?DIAGNOSTIC FINDINGS: CT revealed: R basal ganglia intraparenchymal hemorrhage with intraventricular extension ? ?COGNITION: ?Overall cognitive  status: Impaired: Attention: Deficits   ?Executive function:Organizing, Decision making, Self-monitoring, and Self-correcting ?Attention: Impaired: Divided ?Memory: WFL ?Awareness: Impaired: Anticipatory ?Executive function: Impaired: Impulse control, Organization, Planning, Error awareness, and Self-correction ?Behavior: Within functional limits ?Functional: Unable to complete daily iADLs independently - manage medication, pay pills, manage his business.  ? ?COGNITIVE COMMUNICATION ?Following directions: Follows multi-step commands consistently  ?Auditory comprehension: WFL ?Verbal expression: WFL ?Functional communication: Impaired: Difficulty navigating conversations with others due to re: Difficulties planning and organizing language content and the way the person communicates, as well as drifting off the topic.  ? ?ORAL MOTOR EXAMINATION ?Facial : WFL  ?Lingual: WFL ?Velum: WFL ?Mandible: WFL ?Cough: WFL ?Voice: WFL ? ?STANDARDIZED ASSESSMENTS: ?CLQT: Attention: WNL, Memory: WNL, Executive Function: Mild, Language: WNL, Visuospatial Skills: WNL, and Clock Drawing: Moderate ? ? PATIENT REPORTED OUTCOME MEASURES (PROM): ?NA ? ? ? ? ?PATIENT EDUCATION: ?Education details: Cog-comm impairment ?Person educated: Patient ?Education method: Explanation ?Education comprehension: verbalized understanding and needs further education ? ? ? ? ?GOALS: ?Goals reviewed with patient? Yes ? ?SHORT TERM GOALS: Target date: 06/20/2021 ? ?Pt will complete medication management task by comprehending medication directions and verbalizing errors made with minA verbal cues.  ?Baseline: Requires maxA at home ?Goal status: INITIAL ? ?2.  Pt will verbalize Goal, Plan, Do, Review for given therapeutic task with modA verbal/visual cues.  ?Baseline: No knowledge ?Goal status: INITIAL ? ?3.  Pt will comprehend organizational aids and verbalize the purpose of each aid with minA.   ?Baseline: No knowledge ?  Goal status: INITIAL ? ? ?LONG TERM  GOALS: Target date: 07/18/2021 ? ?Pt will complete medication management task by comprehending medication directions and verbalizing errors made independently.  ?Baseline:  ?Goal status: INITIAL ? ?2.  Pt will verbalize Goal, Plan, Do, Review for given therapeutic task with minA verbal/visual cues.  ?Baseline:  ?Goal status: INITIAL ? ?3.  Pt will demonstrate and/or report use of organizational aids at home/work.  ?Baseline:  ?Goal status: INITIAL ? ?ASSESSMENT: ? ?CLINICAL IMPRESSION: ?Pt is a 56 yo male who presents to [place] for evaluation post R CVA. Pt endorses difficulty with maintaining focus. Pt had difficulty reporting other areas he was struggling in, but he was able to recall that he was working on "executive functioning" in the hospital. Pt reported that he requires help with all iADLs at home, specifically medication and financial management. 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 SLP provided edu on SLP role and completed cognitive assessment. Pt was assessed using CLQT. See above for details. SLP observed L inattention throughout assessment and demonstrated some difficulty with identifying errors. Pt demonstrated relative strengths in memory and relative weaknesses were observed in executive functioning and attention. SLP rec skilled ST services to address cognitive-communication impairment as this is impacting pt's ability to participate functionally in daily life. .  ? ? ?OBJECTIVE IMPAIRMENTS include attention, awareness, and executive functioning. These impairments are limiting patient from return to work, managing medications, managing appointments, managing finances, household responsibilities, and effectively communicating at home and in community. ?Factors affecting potential to achieve goals and functional outcome are financial resources.. Patient will benefit from skilled SLP services to address above impairments and  improve overall function. ? ?REHAB POTENTIAL: Good ? ?PLAN: ?SLP FREQUENCY: 2x/week ? ?SLP DURATION: 8 weeks ? ?PLANNED INTERVENTIONS: Language facilitation, Environmental controls, Cueing hierachy, Cognitive reorganization, Internal/external aids, Functional tasks, SLP instruction and feedback, Compensatory strategies, and Patient/family education ? ? ? ?Verdene Lennert, [place] ?05/23/2021, 10:25 AM ? ? ? ?  ?

## 2021-05-24 ENCOUNTER — Emergency Department (HOSPITAL_COMMUNITY): Payer: Self-pay

## 2021-05-24 ENCOUNTER — Encounter (HOSPITAL_COMMUNITY): Payer: Self-pay | Admitting: Emergency Medicine

## 2021-05-24 ENCOUNTER — Other Ambulatory Visit: Payer: Self-pay

## 2021-05-24 ENCOUNTER — Observation Stay (HOSPITAL_COMMUNITY)
Admission: EM | Admit: 2021-05-24 | Discharge: 2021-05-25 | Disposition: A | Discharge status: Home / Self Care | Payer: Self-pay | Attending: Internal Medicine | Admitting: Internal Medicine

## 2021-05-24 DIAGNOSIS — F329 Major depressive disorder, single episode, unspecified: Secondary | ICD-10-CM | POA: Insufficient documentation

## 2021-05-24 DIAGNOSIS — Z20822 Contact with and (suspected) exposure to covid-19: Secondary | ICD-10-CM | POA: Insufficient documentation

## 2021-05-24 DIAGNOSIS — R4701 Aphasia: Secondary | ICD-10-CM

## 2021-05-24 DIAGNOSIS — R29818 Other symptoms and signs involving the nervous system: Secondary | ICD-10-CM

## 2021-05-24 DIAGNOSIS — N1832 Chronic kidney disease, stage 3b: Secondary | ICD-10-CM | POA: Insufficient documentation

## 2021-05-24 DIAGNOSIS — I1A0 Resistant hypertension: Secondary | ICD-10-CM

## 2021-05-24 DIAGNOSIS — R4182 Altered mental status, unspecified: Secondary | ICD-10-CM | POA: Insufficient documentation

## 2021-05-24 DIAGNOSIS — Z8679 Personal history of other diseases of the circulatory system: Secondary | ICD-10-CM

## 2021-05-24 DIAGNOSIS — F320 Major depressive disorder, single episode, mild: Secondary | ICD-10-CM | POA: Diagnosis present

## 2021-05-24 DIAGNOSIS — I10 Essential (primary) hypertension: Secondary | ICD-10-CM

## 2021-05-24 DIAGNOSIS — Z8616 Personal history of COVID-19: Secondary | ICD-10-CM | POA: Insufficient documentation

## 2021-05-24 DIAGNOSIS — I129 Hypertensive chronic kidney disease with stage 1 through stage 4 chronic kidney disease, or unspecified chronic kidney disease: Secondary | ICD-10-CM | POA: Insufficient documentation

## 2021-05-24 DIAGNOSIS — G459 Transient cerebral ischemic attack, unspecified: Principal | ICD-10-CM | POA: Insufficient documentation

## 2021-05-24 LAB — COMPREHENSIVE METABOLIC PANEL
ALT: 42 U/L (ref 0–44)
AST: 26 U/L (ref 15–41)
Albumin: 4 g/dL (ref 3.5–5.0)
Alkaline Phosphatase: 144 U/L — ABNORMAL HIGH (ref 38–126)
Anion gap: 7 (ref 5–15)
BUN: 20 mg/dL (ref 6–20)
CO2: 26 mmol/L (ref 22–32)
Calcium: 9.3 mg/dL (ref 8.9–10.3)
Chloride: 107 mmol/L (ref 98–111)
Creatinine, Ser: 1.68 mg/dL — ABNORMAL HIGH (ref 0.61–1.24)
GFR, Estimated: 48 mL/min — ABNORMAL LOW (ref >60)
Glucose, Bld: 88 mg/dL (ref 70–99)
Potassium: 4.3 mmol/L (ref 3.5–5.1)
Sodium: 140 mmol/L (ref 135–145)
Total Bilirubin: 0.4 mg/dL (ref 0.3–1.2)
Total Protein: 7.4 g/dL (ref 6.5–8.1)

## 2021-05-24 LAB — I-STAT CHEM 8, ED
BUN: 22 mg/dL — ABNORMAL HIGH (ref 6–20)
Calcium, Ion: 1.14 mmol/L — ABNORMAL LOW (ref 1.15–1.40)
Chloride: 104 mmol/L (ref 98–111)
Creatinine, Ser: 1.9 mg/dL — ABNORMAL HIGH (ref 0.61–1.24)
Glucose, Bld: 87 mg/dL (ref 70–99)
HCT: 43 % (ref 39.0–52.0)
Hemoglobin: 14.6 g/dL (ref 13.0–17.0)
Potassium: 4.3 mmol/L (ref 3.5–5.1)
Sodium: 142 mmol/L (ref 135–145)
TCO2: 28 mmol/L (ref 22–32)

## 2021-05-24 LAB — DIFFERENTIAL
Abs Immature Granulocytes: 0.02 10*3/uL (ref 0.00–0.07)
Basophils Absolute: 0.1 10*3/uL (ref 0.0–0.1)
Basophils Relative: 1 %
Eosinophils Absolute: 0.3 10*3/uL (ref 0.0–0.5)
Eosinophils Relative: 4 %
Immature Granulocytes: 0 %
Lymphocytes Relative: 15 %
Lymphs Abs: 1.3 10*3/uL (ref 0.7–4.0)
Monocytes Absolute: 0.6 10*3/uL (ref 0.1–1.0)
Monocytes Relative: 7 %
Neutro Abs: 6.4 10*3/uL (ref 1.7–7.7)
Neutrophils Relative %: 73 %

## 2021-05-24 LAB — URINALYSIS, ROUTINE W REFLEX MICROSCOPIC
Bilirubin Urine: NEGATIVE
Glucose, UA: NEGATIVE mg/dL
Hgb urine dipstick: NEGATIVE
Ketones, ur: NEGATIVE mg/dL
Leukocytes,Ua: NEGATIVE
Nitrite: NEGATIVE
Protein, ur: NEGATIVE mg/dL
Specific Gravity, Urine: 1.024 (ref 1.005–1.030)
pH: 8 (ref 5.0–8.0)

## 2021-05-24 LAB — RAPID URINE DRUG SCREEN, HOSP PERFORMED
Amphetamines: NOT DETECTED
Barbiturates: NOT DETECTED
Benzodiazepines: NOT DETECTED
Cocaine: NOT DETECTED
Opiates: NOT DETECTED
Tetrahydrocannabinol: NOT DETECTED

## 2021-05-24 LAB — ETHANOL: Alcohol, Ethyl (B): <10 mg/dL (ref <10)

## 2021-05-24 LAB — CBC
HCT: 42.6 % (ref 39.0–52.0)
Hemoglobin: 14.5 g/dL (ref 13.0–17.0)
MCH: 30 pg (ref 26.0–34.0)
MCHC: 34 g/dL (ref 30.0–36.0)
MCV: 88 fL (ref 80.0–100.0)
Platelets: 253 10*3/uL (ref 150–400)
RBC: 4.84 MIL/uL (ref 4.22–5.81)
RDW: 13.2 % (ref 11.5–15.5)
WBC: 8.7 10*3/uL (ref 4.0–10.5)
nRBC: 0 % (ref 0.0–0.2)

## 2021-05-24 LAB — APTT: aPTT: 25 seconds (ref 24–36)

## 2021-05-24 LAB — CBG MONITORING, ED: Glucose-Capillary: 74 mg/dL (ref 70–99)

## 2021-05-24 LAB — RESP PANEL BY RT-PCR (FLU A&B, COVID) ARPGX2
Influenza A by PCR: NEGATIVE
Influenza B by PCR: NEGATIVE
SARS Coronavirus 2 by RT PCR: NEGATIVE

## 2021-05-24 LAB — PROTIME-INR
INR: 1.1 (ref 0.8–1.2)
Prothrombin Time: 13.6 seconds (ref 11.4–15.2)

## 2021-05-24 MED ORDER — ATORVASTATIN CALCIUM 40 MG PO TABS
40.0000 mg | ORAL_TABLET | Freq: Every day | ORAL | Status: DC
  Administered 2021-05-25: 40 mg via ORAL
  Filled 2021-05-24: qty 1

## 2021-05-24 MED ORDER — HYDRALAZINE HCL 20 MG/ML IJ SOLN: 10.0000 mg | Freq: Four times a day (QID) | INTRAMUSCULAR | Status: DC | PRN

## 2021-05-24 MED ORDER — IOHEXOL 350 MG/ML SOLN
75.0000 mL | Freq: Once | INTRAVENOUS | Status: AC | PRN
Start: 2021-05-24 — End: 2021-05-24
  Administered 2021-05-24: 75 mL via INTRAVENOUS

## 2021-05-24 MED ORDER — ASPIRIN EC 81 MG PO TBEC
81.0000 mg | DELAYED_RELEASE_TABLET | Freq: Every day | ORAL | Status: DC
  Administered 2021-05-25 (×2): 81 mg via ORAL
  Filled 2021-05-24 (×2): qty 1

## 2021-05-24 MED ORDER — SODIUM CHLORIDE 0.9% FLUSH
3.0000 mL | Freq: Once | INTRAVENOUS | Status: AC
  Administered 2021-05-24: 3 mL via INTRAVENOUS

## 2021-05-24 MED ORDER — HYDRALAZINE HCL 50 MG PO TABS
100.0000 mg | ORAL_TABLET | Freq: Three times a day (TID) | ORAL | Status: DC
  Administered 2021-05-25 (×3): 100 mg via ORAL
  Filled 2021-05-24 (×4): qty 2

## 2021-05-24 MED ORDER — MAGNESIUM OXIDE -MG SUPPLEMENT 400 (240 MG) MG PO TABS
400.0000 mg | ORAL_TABLET | Freq: Every day | ORAL | Status: DC
  Administered 2021-05-25: 400 mg via ORAL
  Filled 2021-05-24: qty 1

## 2021-05-24 MED ORDER — POTASSIUM CHLORIDE CRYS ER 20 MEQ PO TBCR
20.0000 meq | EXTENDED_RELEASE_TABLET | Freq: Two times a day (BID) | ORAL | Status: DC
  Administered 2021-05-25 (×2): 20 meq via ORAL
  Filled 2021-05-24 (×2): qty 1

## 2021-05-24 MED ORDER — HYDROCHLOROTHIAZIDE 12.5 MG PO TABS
12.5000 mg | ORAL_TABLET | Freq: Every day | ORAL | Status: DC
  Administered 2021-05-24 – 2021-05-25 (×2): 12.5 mg via ORAL
  Filled 2021-05-24 (×2): qty 1

## 2021-05-24 MED ORDER — LORAZEPAM 2 MG/ML IJ SOLN
2.0000 mg | Freq: Once | INTRAMUSCULAR | Status: AC
  Administered 2021-05-24: 2 mg via INTRAVENOUS

## 2021-05-24 MED ORDER — AMLODIPINE BESYLATE 10 MG PO TABS
10.0000 mg | ORAL_TABLET | Freq: Every day | ORAL | Status: DC
  Administered 2021-05-25: 10 mg via ORAL
  Filled 2021-05-24: qty 1

## 2021-05-24 MED ORDER — ACETAMINOPHEN 160 MG/5ML PO SOLN: 650.0000 mg | ORAL | Status: DC | PRN

## 2021-05-24 MED ORDER — CITALOPRAM HYDROBROMIDE 10 MG PO TABS
20.0000 mg | ORAL_TABLET | Freq: Every day | ORAL | Status: DC
  Administered 2021-05-25: 20 mg via ORAL
  Filled 2021-05-24: qty 2

## 2021-05-24 MED ORDER — LORAZEPAM 2 MG/ML IJ SOLN
INTRAMUSCULAR | Status: AC
  Filled 2021-05-24: qty 1

## 2021-05-24 MED ORDER — STROKE: EARLY STAGES OF RECOVERY BOOK
Freq: Once | Status: AC
  Filled 2021-05-24: qty 1

## 2021-05-24 MED ORDER — TRAZODONE HCL 50 MG PO TABS
75.0000 mg | ORAL_TABLET | Freq: Every day | ORAL | Status: DC
  Administered 2021-05-25: 75 mg via ORAL
  Filled 2021-05-24: qty 2

## 2021-05-24 MED ORDER — METOPROLOL TARTRATE 50 MG PO TABS
75.0000 mg | ORAL_TABLET | Freq: Two times a day (BID) | ORAL | Status: DC
  Administered 2021-05-25 (×2): 75 mg via ORAL
  Filled 2021-05-24 (×2): qty 1

## 2021-05-24 MED ORDER — HYDRALAZINE HCL 20 MG/ML IJ SOLN
10.0000 mg | Freq: Once | INTRAMUSCULAR | Status: AC
  Administered 2021-05-24: 10 mg via INTRAVENOUS
  Filled 2021-05-24: qty 1

## 2021-05-24 MED ORDER — ACETAMINOPHEN 325 MG PO TABS: 650.0000 mg | ORAL_TABLET | ORAL | Status: DC | PRN

## 2021-05-24 MED ORDER — CLONIDINE HCL 0.3 MG/24HR TD PTWK
0.3000 mg | MEDICATED_PATCH | TRANSDERMAL | Status: DC
  Administered 2021-05-25: 0.3 mg via TRANSDERMAL
  Filled 2021-05-24 (×2): qty 1

## 2021-05-24 MED ORDER — PANTOPRAZOLE SODIUM 40 MG PO TBEC
40.0000 mg | DELAYED_RELEASE_TABLET | Freq: Every day | ORAL | Status: DC
  Administered 2021-05-25: 40 mg via ORAL
  Filled 2021-05-24: qty 1

## 2021-05-24 MED ORDER — AMANTADINE HCL 100 MG PO CAPS
100.0000 mg | ORAL_CAPSULE | Freq: Every day | ORAL | Status: DC
  Administered 2021-05-25: 100 mg via ORAL
  Filled 2021-05-24: qty 1

## 2021-05-24 MED ORDER — BACLOFEN 10 MG PO TABS
10.0000 mg | ORAL_TABLET | Freq: Three times a day (TID) | ORAL | Status: DC
  Administered 2021-05-25 (×3): 10 mg via ORAL
  Filled 2021-05-24 (×4): qty 1

## 2021-05-24 MED ORDER — SENNOSIDES-DOCUSATE SODIUM 8.6-50 MG PO TABS
2.0000 | ORAL_TABLET | ORAL | Status: DC
  Administered 2021-05-25: 2 via ORAL
  Filled 2021-05-24: qty 2

## 2021-05-24 MED ORDER — ACETAMINOPHEN 650 MG RE SUPP: 650.0000 mg | RECTAL | Status: DC | PRN

## 2021-05-24 NOTE — H&P (Signed)
?History and Physical  ? ? ?Patient: Miguel Mccullough DOB: 06/16/1965 ?DOA: 05/24/2021 ?DOS: the patient was seen and examined on 05/24/2021 ?PCP: Pcp, No  ?Patient coming from: Home ? ?Chief Complaint:  ?Chief Complaint  ?Patient presents with  ? Code Stroke  ? ?HPI: Miguel Mccullough is a 56 y.o. male with medical history significant of HTN,HLD, CKD3B, recent hemorrhagic stroke (2/26) with residual left hemiparesis who presents with right sided weakness and unresponsiveness. ? ?Patient reports aphasia around 1530 today after using the bathroom. Wife sat him down and he then could not move any of his extremities with drooling to right side of his face. He had a witness episode again in triage in ED with aphasia and drooling from right side of his mouth.  ? ?He was recently admitted from 2/26-3/2 symptoms of left sided weakness and paresis. Found to have right basal ganglia intraparenchymal hematoma with intraventricular extension on CT head and MRI brain. Had hypertensive emergency initially requiring Celviprez in ICU and discharged on 3 antihypertensives. Also had AKI with CKD3B with creatinine up to 2.7 at one point with no known prior baseline.  ?He was discharged to rehab and continue to have labile HTN and placed on 4 anti-hypertensives. At recent hospital follow up on 4/4 primary aldosterone workup for secondary HTN was started but is still pending.  ? ?In the ED, he was hypertensive up to 180/110 and had complete resolution of his symptoms. No leukocytosis or anemia.  No electrolyte abnormalities other than creatinine of 1.9. ? ?Negative flu and COVID PCR. ?CT head and EEG were negative. ? ?Neurology was consulted by ED physician and hospitalist was consulted for admission. ?Review of Systems: As mentioned in the history of present illness. All other systems reviewed and are negative. ?Past Medical History:  ?Diagnosis Date  ? COVID-19 2021  ? continues to have increase in suptum production.  ?  Hypertension   ? ?History reviewed. No pertinent surgical history. ?Social History:  reports that he has never smoked. He has never used smokeless tobacco. He reports that he does not drink alcohol and does not use drugs. ? ?No Known Allergies ? ?Family History  ?Problem Relation Age of Onset  ? Hypertension Mother   ? Hypertension Father   ? Hypertension Sister   ? Hypertension Brother   ? ? ?Prior to Admission medications   ?Medication Sig Start Date End Date Taking? Authorizing Provider  ?acetaminophen (TYLENOL) 325 MG tablet Take 1-2 tablets (325-650 mg total) by mouth every 4 (four) hours as needed for mild pain. 05/15/21   Love, Ivan Anchors, PA-C  ?amantadine (SYMMETREL) 100 MG capsule Take 1 capsule (100 mg total) by mouth daily. 05/16/21   Love, Ivan Anchors, PA-C  ?amLODipine (NORVASC) 10 MG tablet Take 1 tablet (10 mg total) by mouth at bedtime. 05/20/21   Ladell Pier, MD  ?atorvastatin (LIPITOR) 40 MG tablet Take 1 tablet (40 mg total) by mouth at bedtime. 05/20/21   Ladell Pier, MD  ?baclofen (LIORESAL) 10 MG tablet Take 1 tablet (10 mg total) by mouth 3 (three) times daily. 05/20/21   Ladell Pier, MD  ?citalopram (CELEXA) 20 MG tablet Take 1 tablet (20 mg total) by mouth daily. 05/20/21   Ladell Pier, MD  ?cloNIDine (CATAPRES - DOSED IN MG/24 HR) 0.3 mg/24hr patch Place 1 patch (0.3 mg total) onto the skin once a week. 05/20/21   Ladell Pier, MD  ?fluticasone Asencion Islam) 50 MCG/ACT nasal spray Place  1 spray into both nostrils daily. 05/16/21   Love, Ivan Anchors, PA-C  ?hydrALAZINE (APRESOLINE) 100 MG tablet Take 1 tablet (100 mg total) by mouth every 8 (eight) hours. 05/20/21   Ladell Pier, MD  ?hydrocortisone cream 1 % Apply topically 3 (three) times daily. 05/15/21   Love, Ivan Anchors, PA-C  ?magnesium oxide (MAG-OX) 400 MG tablet Take 1 tablet (400 mg total) by mouth daily. 05/16/21   Love, Ivan Anchors, PA-C  ?methylphenidate (RITALIN) 5 MG tablet Take 0.5 tablets (2.5 mg total) by mouth  daily. 05/20/21   Ladell Pier, MD  ?metoprolol tartrate (LOPRESSOR) 50 MG tablet Take 1 & 1/2 tablets (75 mg total) by mouth 2 (two) times daily. 05/20/21   Ladell Pier, MD  ?pantoprazole (PROTONIX) 40 MG tablet Take 1 tablet (40 mg total) by mouth at bedtime. 05/20/21   Ladell Pier, MD  ?potassium chloride SA (KLOR-CON M) 20 MEQ tablet Take 1 tablet (20 mEq total) by mouth 2 (two) times daily. 05/20/21   Ladell Pier, MD  ?senna-docusate (SENOKOT-S) 8.6-50 MG tablet Take 2 tablets by mouth at bedtime as needed for mild constipation. 05/20/21   Ladell Pier, MD  ?traZODone (DESYREL) 150 MG tablet Take 0.5 tablets (75 mg total) by mouth at bedtime. 05/20/21   Ladell Pier, MD  ? ? ?Physical Exam: ?Vitals:  ? 05/24/21 2030 05/24/21 2100 05/24/21 2130 05/24/21 2200  ?BP: (!) 188/108 (!) 184/102 (!) 152/96 (!) 206/113  ?Pulse: 63 63 60 65  ?Resp: (!) 25 14 15 15   ?Temp:      ?TempSrc:      ?SpO2: 100% 100% 100% 100%  ?Weight:      ?Height:      ? ?Constitutional: NAD, calm, comfortable, young male appearing younger than stated age laying flat in bed ?Eyes: PERRL, lids and conjunctivae normal ?ENMT: Mucous membranes are moist.  ?Neck: normal, supple ?Respiratory: clear to auscultation bilaterally, no wheezing, no crackles. Normal respiratory effort. No accessory muscle use.  ?Cardiovascular: Regular rate and rhythm, no murmurs / rubs / gallops. No extremity edema. 2+ pedal pulses.  ?Abdomen: Soft, non distended, no tenderness,  Bowel sounds positive.  ?Musculoskeletal: no clubbing / cyanosis. No joint deformity upper and lower extremities. ?Skin: no rashes, lesions, ulcers. No induration ?Neurologic: CN 2-12 grossly intact.  Hemiparesis of the left upper and lower extremity.  5 out of 5 strength of the right upper and lower extremity.  No facial asymmetry.  Equal smile and shoulder shrug. ?psychiatric: Normal judgment and insight. Alert and oriented x 3. Normal mood. ?Data Reviewed: ? ?See  HPI ? ?Assessment and Plan: ? ?Aphasia  ?Hx of right basal ganglia intraparenchymal hematoma in 2/26 with resulting left hemiparesis.  Presents today with multiple episodes of aphasia and right-sided hemiparesis with negative CT head, MRI brain and EEG concerning for TIA. ?-Continue TIA work-up with TTE ?-Start aspirin 81 mg per neurology since time interval from prior ICH is long enough to safely start ?- Obtain hemoglobin A1c, fasting lipid panel ?- PT/OT/speech consult ?- Frequent neurochecks ? ?Resistant HTN  ?On amlodipine 10mg , metoprolol 75mg  BID, hydralazine 100mg  q8hr, and Clonidine 0.3 weekly patch and BP remains not at goal. Had aldosterone renin workup outpatient that is still pending. Will also check TSH.  He was started on Ritalin in rehab-will discontinue as this can be contributory to increasing his blood pressure. ?-Presenting SBP of 188.  Goal of 150/160 per neurology due to recent history of ICH.  I did discuss case with critical care regarding being placed on possible Cleviprex infusion but since patient is not having active end-organ damage they recommend doing IV PRN anti-hypertensives. ?-Will continue all home meds and give IV hydralazine just overnight at 10mg  q6hr for SBP greater than 180 ?-He has renal sufficiency that limits adding on diuretics or ACE-I but creatinine on admit is improved from prior hospitalization. Will trial adding addition of 12.5mg  HCTZ as he is maxed out on amlodipine, hydralazine, clonidine and heart rate is in the 60s which does not give much room for increase to his metoprolol ?-will continue to monitor closely  ? ?CKD3b ?Creatinine at 1.68 on admit. No prior baseline but this is improved from previous AKI several weeks ago.  ?Continue to monitor and avoid nephrotoxin agents ? ?Major depression ?Continue citalopram ? ? Advance Care Planning:   Code Status: Full Code  ? ?Consults: neurology ? ?Family Communication: Family at bedside ? ?Severity of Illness: ?The  appropriate patient status for this patient is OBSERVATION. Observation status is judged to be reasonable and necessary in order to provide the required intensity of service to ensure the patient's safety. The patient's pres

## 2021-05-24 NOTE — Assessment & Plan Note (Signed)
Hx of right basal ganglia intraparenchymal hematoma in 2/26 with resulting left hemiparesis.  Presents today with multiple episodes of aphasia and right-sided hemiparesis with negative CT head, MRI brain and EEG concerning for TIA. ?-Continue TIA work-up with TTE ?-Start aspirin 81 mg per neurology since time interval from prior ICH is long enough to safely start ?- Obtain hemoglobin A1c, fasting lipid panel ?- PT/OT/speech consult ?- Frequent neurochecks ?

## 2021-05-24 NOTE — Procedures (Addendum)
Patient Name: Miguel Mccullough  ?MRN: [id number]  ?Epilepsy Attending: Lora Havens  ?Referring Physician/Provider: Kerney Elbe, MD ?Date: 05/24/2021 ?Duration: 21.35 mins ? ?Patient history:  56 y.o. 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 EEG to evaluate for seizure ? ?Level of alertness: Awake, drowsy ? ?AEDs during EEG study: None ? ?Technical aspects: This EEG study was done with scalp electrodes positioned according to the 10-20 International system of electrode placement. Electrical activity was acquired at a sampling rate of 500Hz  and reviewed with a high frequency filter of 70Hz  and a low frequency filter of 1Hz . EEG data were recorded continuously and digitally stored.  ? ?Description: The posterior dominant rhythm consists of 9 Hz activity of moderate voltage (25-35 uV) seen predominantly in posterior head regions, symmetric and reactive to eye opening and eye closing.  Drowsiness was characterized by attenuation of posterior dominant rhythm. Hyperventilation and photic stimulation were not performed.    ? ?EKG artifact was seen during the study. ? ?IMPRESSION: ?This study is within normal limits. No seizures or epileptiform discharges were seen throughout the recording. ? ?Lora Havens  ? ?

## 2021-05-24 NOTE — ED Notes (Signed)
Patient transported to MRI 

## 2021-05-24 NOTE — Progress Notes (Signed)
EEG COMPLETE, PENDING RESULTS. ?

## 2021-05-24 NOTE — ED Notes (Signed)
Pt transported to MRI 

## 2021-05-24 NOTE — ED Notes (Signed)
EEG at the bedside  ?

## 2021-05-24 NOTE — Assessment & Plan Note (Addendum)
Creatinine at 1.68 on admit. No prior baseline but this is improved from previous AKI several weeks ago.  ?Continue to monitor and avoid nephrotoxin agents ? ?

## 2021-05-24 NOTE — Assessment & Plan Note (Signed)
On amlodipine 10mg , metoprolol 75mg  BID, hydralazine 100mg  q8hr, and Clonidine 0.3 weekly patch and BP remains not at goal. Had aldosterone renin workup outpatient that is still pending. Will also check TSH.  He was started on Ritalin in rehab-will discontinue as this can be contributory to increasing his blood pressure. ?-Presenting SBP of 188.  Goal of 150/160 per neurology due to recent history of ICH.  I did discuss case with critical care regarding being placed on possible Cleviprex infusion but since patient is not having active end-organ damage they recommend doing IV PRN anti-hypertensives. ?-Will continue all home meds and give IV hydralazine just overnight at 10mg  q6hr for SBP greater than 180 ?-He has renal sufficiency that limits adding on diuretics or ACE-I but creatinine on admit is improved from prior hospitalization. Will trial adding addition of 12.5mg  HCTZ as he is maxed out on amlodipine, hydralazine, clonidine and heart rate is in the 60s which does not give much room for increase to his metoprolol ?-will continue to monitor closely  ? ?

## 2021-05-24 NOTE — Consult Note (Signed)
?                    NEURO HOSPITALIST CONSULT NOTE  ? ?Requestig physician: Dr.Curatolo ? ?Reason for Consult: Sudden onset of right sided flaccidity and unresponsiveness ? ?History obtained from:  Patient and Chart    ? ?HPI:                                                                                                                                         ? Miguel Mccullough is an 56 y.o. 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 Symptoms occurred at 1530. He was initially alert and oriented on arrival to the ED. CBG was 74. While being evaluated by the EDP he started staring with severe bruxism and spittle emanating from his mouth and was unresponsive at that time. No jerking or twitching was noted. He was given 2 mg Ativan and recovered within 2 minutes without any confusion afterwards. Neurology was called to evaluate for TIA versus seizure.  ? ?Past Medical History:  ?Diagnosis Date  ? COVID-19 2021  ? continues to have increase in suptum production.  ? Hypertension   ? ? ?No past surgical history on file. ? ?Family History  ?Problem Relation Age of Onset  ? Hypertension Mother   ? Hypertension Father   ? Hypertension Sister   ? Hypertension Brother   ?           ? ?Social History:  reports that he has never smoked. He has never used smokeless tobacco. He reports that he does not drink alcohol and does not use drugs. ? ?No Known Allergies ? ?HOME MEDICATIONS:                                                                                                                     ? ?No current facility-administered medications on file prior to encounter.  ? ?Current Outpatient Medications on File Prior to Encounter  ?Medication Sig Dispense Refill  ? acetaminophen (TYLENOL) 325 MG tablet Take 1-2 tablets (325-650  mg total) by mouth every 4 (four) hours as needed for mild pain. 100 tablet 0  ? amantadine (SYMMETREL) 100 MG capsule Take  1 capsule (100 mg total) by mouth daily. 30 capsule 0  ? amLODipine (NORVASC) 10 MG tablet Take 1 tablet (10 mg total) by mouth at bedtime. 30 tablet 6  ? atorvastatin (LIPITOR) 40 MG tablet Take 1 tablet (40 mg total) by mouth at bedtime. 30 tablet 6  ? baclofen (LIORESAL) 10 MG tablet Take 1 tablet (10 mg total) by mouth 3 (three) times daily. 90 each 6  ? citalopram (CELEXA) 20 MG tablet Take 1 tablet (20 mg total) by mouth daily. 30 tablet 3  ? cloNIDine (CATAPRES - DOSED IN MG/24 HR) 0.3 mg/24hr patch Place 1 patch (0.3 mg total) onto the skin once a week. 4 patch 6  ? fluticasone (FLONASE) 50 MCG/ACT nasal spray Place 1 spray into both nostrils daily. 16 g 0  ? hydrALAZINE (APRESOLINE) 100 MG tablet Take 1 tablet (100 mg total) by mouth every 8 (eight) hours. 90 tablet 6  ? hydrocortisone cream 1 % Apply topically 3 (three) times daily. 28 g 0  ? magnesium oxide (MAG-OX) 400 MG tablet Take 1 tablet (400 mg total) by mouth daily. 30 tablet 0  ? methylphenidate (RITALIN) 5 MG tablet Take 0.5 tablets (2.5 mg total) by mouth daily. 15 tablet 0  ? metoprolol tartrate (LOPRESSOR) 50 MG tablet Take 1 & 1/2 tablets (75 mg total) by mouth 2 (two) times daily. 90 tablet 6  ? pantoprazole (PROTONIX) 40 MG tablet Take 1 tablet (40 mg total) by mouth at bedtime. 30 tablet 1  ? potassium chloride SA (KLOR-CON M) 20 MEQ tablet Take 1 tablet (20 mEq total) by mouth 2 (two) times daily. 60 tablet 4  ? senna-docusate (SENOKOT-S) 8.6-50 MG tablet Take 2 tablets by mouth at bedtime as needed for mild constipation. 60 tablet 3  ? traZODone (DESYREL) 150 MG tablet Take 0.5 tablets (75 mg total) by mouth at bedtime. 30 tablet 6  ? ? ? ?ROS:                                                                                                                                       ?As per HPI. Detailed ROS deferred in  the context of acuity of presentation.  ? ? ?Blood pressure (!) 180/110, pulse 81, temperature 98.2 ?F (36.8 ?C), temperature source Oral, resp. rate 20, SpO2 98 %. ? ? ?General Examination:                                                                                                      ? ?  Physical Exam  ?HEENT-  [place]/AT   ?Lungs- Respirations unlabored ?Extremities- Warm and well perfused. Orthotic to foot/ankle/lower leg is noted on the left.  ? ?Neurological Examination ?Mental Status: Awake and alert. Oriented x 5. Speech fluent with intact naming and comprehension.  ?Cranial Nerves: ?II: Temporal visual fields intact bilaterally. No extinction to DSS.   ?III,IV, VI: No ptosis. EOMI. No nystagmus.  ?V: Temp sensation equal bilaterally ?VII: Smile symmetric ?VIII: Hearing intact to conversation ?IX,X: No hoarseness or hypophonia ?XI: Symmetric ?XII: Midline tongue extension ?Motor: ?RUE and RLE 5/5 ?LUE 2-3/5 with slow movements and increased extensor and flexor tone at elbow, increased flexor tone at wrist and digits ?LLE 3/5 proximally with 2/5 distally ?Sensory: Temp and light touch intact to BUE and BLE.  ?Deep Tendon Reflexes: 2+ right brachioradialis, patella and achilles, 3+ left brachioradialis and patella with 4+ left achilles ?Sustained clonus with passive dorsiflexion of left foot.  ?Cerebellar: No ataxia with FNF on the right. Unable to perform on the left.  ?Gait: Deferred ?  ?Lab Results: ?Basic Metabolic Panel: ?Recent Labs  ?Lab 05/20/21 ?1532  ?NA 142  ?K 4.4  ?CL 103  ?CO2 25  ?GLUCOSE 83  ?BUN 22  ?CREATININE 1.80*  ?CALCIUM 9.6  ? ? ?CBC: ?No results for input(s): WBC, NEUTROABS, HGB, HCT, MCV, PLT in the last 168 hours. ? ?Cardiac Enzymes: ?No results for input(s): CKTOTAL, CKMB, CKMBINDEX, TROPONINI in the last 168 hours. ? ?Lipid Panel: ?No results for input(s): CHOL, TRIG, HDL, CHOLHDL, VLDL, LDLCALC in the last 168 hours. ? ?Imaging: ?No results found. ? ? ?Assessment: 56 year old male  presenting with sudden onset of right sided flaccidity and unresponsiveness ?1. Exam reveals left sided deficits localizing to the right thalamic encephalomalacia seen on CT.  ?2. CT head: No new or acute insult. Previously seen hemorrhage within the right thalamus is no longer hyperdense. Low-density present in that region consistent with prior stroke. Old strokes in the basal ?ganglia right larger than left. ?3. CTA of head and neck: No acute vascular finding. No large vessel occlusion or flow limiting proximal stenosis. Atherosclerotic irregularity of the M1 segments and the distal branch vessels of both the anterior and posterior circulations. ? ?Recommendations: ?1. MRI brain without contrast (ordered).  ?2. EEG (ordered) ? ?Addendum: ?- EEG: This study is within normal limits. No seizures or epileptiform discharges were seen throughout the recording. ?- Most likely etiology for his presentation given negative EEG is TIA. Will need to admit for TIA work up to include MRI brain, TTE and cardiac telemetry.  ?- Continue Lipitor ?- Hold off on permissive HTN given his history of ICH with morphology on CT that is typical of hypertensive bleed ?- Start ASA 81 mg po qd. Time interval since prior ICH is long enough to safely start and CT head shows no evidence for residual hemorrhage. Can continue ASA provided that BPs are well controlled.  ?- HgbA1c, fasting lipid panel ?- PT consult, OT consult, Speech consult ?- Risk factor modification ?- Frequent neuro checks ?- NPO until passes stroke swallow screen ? ? ?Electronically signed: Dr. Kerney Elbe ?05/24/2021, 4:24 PM ? ? ?   ?

## 2021-05-24 NOTE — ED Notes (Signed)
Pt unable to respond to command, communicate verbally and unable to move extremities. ?

## 2021-05-24 NOTE — ED Triage Notes (Signed)
Patient here with complaint of TIA. At approximately 1530 patient had sudden onset of right sided flaccidity and unresponsiveness with spouse while shopping, history of left arm paralysis and left leg weakness after a hemorrhagic stroke at the beginning of March 2023. Patient alert and oriented at this time, CBG 74.  ?

## 2021-05-24 NOTE — ED Provider Notes (Addendum)
[facility] EMERGENCY DEPARTMENT Provider Note   CSN: [id number] Arrival date & time: 05/24/21  1600  An emergency department physician performed an initial assessment on this suspected stroke patient at 1619.  History  Chief Complaint  Patient presents with   Code Stroke    Miguel Mccullough is a 55 y.o. male.  The history is provided by the patient.  Neurologic Problem This is a new problem. The current episode started less than 1 hour ago. The problem occurs constantly (waxing and waning). Pertinent negatives include no chest pain, no abdominal pain, no headaches and no shortness of breath. Associated symptoms comments: Right sided weakness and difficulty speaking/unresponsiveness , hx of head bleed with left sided weakness at baseline.  . Nothing aggravates the symptoms. Nothing relieves the symptoms. He has tried nothing for the symptoms. The treatment provided no relief.      Home Medications Prior to Admission medications   Medication Sig Start Date End Date Taking? Authorizing Provider  acetaminophen (TYLENOL) 325 MG tablet Take 1-2 tablets (325-650 mg total) by mouth every 4 (four) hours as needed for mild pain. 05/15/21   Love, Evlyn Kanner, PA-C  amantadine (SYMMETREL) 100 MG capsule Take 1 capsule (100 mg total) by mouth daily. 05/16/21   Love, Evlyn Kanner, PA-C  amLODipine (NORVASC) 10 MG tablet Take 1 tablet (10 mg total) by mouth at bedtime. 05/20/21   Marcine Matar, MD  atorvastatin (LIPITOR) 40 MG tablet Take 1 tablet (40 mg total) by mouth at bedtime. 05/20/21   Marcine Matar, MD  baclofen (LIORESAL) 10 MG tablet Take 1 tablet (10 mg total) by mouth 3 (three) times daily. 05/20/21   Marcine Matar, MD  citalopram (CELEXA) 20 MG tablet Take 1 tablet (20 mg total) by mouth daily. 05/20/21   Marcine Matar, MD  cloNIDine (CATAPRES - DOSED IN MG/24 HR) 0.3 mg/24hr patch Place 1 patch (0.3 mg total) onto the skin once a week. 05/20/21   Marcine Matar,  MD  fluticasone (FLONASE) 50 MCG/ACT nasal spray Place 1 spray into both nostrils daily. 05/16/21   Love, Evlyn Kanner, PA-C  hydrALAZINE (APRESOLINE) 100 MG tablet Take 1 tablet (100 mg total) by mouth every 8 (eight) hours. 05/20/21   Marcine Matar, MD  hydrocortisone cream 1 % Apply topically 3 (three) times daily. 05/15/21   Love, Evlyn Kanner, PA-C  magnesium oxide (MAG-OX) 400 MG tablet Take 1 tablet (400 mg total) by mouth daily. 05/16/21   Love, Evlyn Kanner, PA-C  methylphenidate (RITALIN) 5 MG tablet Take 0.5 tablets (2.5 mg total) by mouth daily. 05/20/21   Marcine Matar, MD  metoprolol tartrate (LOPRESSOR) 50 MG tablet Take 1 & 1/2 tablets (75 mg total) by mouth 2 (two) times daily. 05/20/21   Marcine Matar, MD  pantoprazole (PROTONIX) 40 MG tablet Take 1 tablet (40 mg total) by mouth at bedtime. 05/20/21   Marcine Matar, MD  potassium chloride SA (KLOR-CON M) 20 MEQ tablet Take 1 tablet (20 mEq total) by mouth 2 (two) times daily. 05/20/21   Marcine Matar, MD  senna-docusate (SENOKOT-S) 8.6-50 MG tablet Take 2 tablets by mouth at bedtime as needed for mild constipation. 05/20/21   Marcine Matar, MD  traZODone (DESYREL) 150 MG tablet Take 0.5 tablets (75 mg total) by mouth at bedtime. 05/20/21   Marcine Matar, MD      Allergies    Patient has no known allergies.  Review of Systems   Review of Systems  Respiratory:  Negative for shortness of breath.   Cardiovascular:  Negative for chest pain.  Gastrointestinal:  Negative for abdominal pain.  Neurological:  Negative for headaches.   Physical Exam Updated Vital Signs BP (!) 172/108   Pulse 66   Temp 98.5 F (36.9 C) (Oral)   Resp 13   Ht 5\' 4"  (1.626 m)   Wt 81.6 kg   SpO2 100%   BMI 30.90 kg/m  Physical Exam Vitals and nursing note reviewed.  Constitutional:      General: He is not in acute distress.    Appearance: He is well-developed.  HENT:     Head: Normocephalic and atraumatic.     Nose: Nose normal.      Mouth/Throat:     Mouth: Mucous membranes are moist.  Eyes:     Extraocular Movements: Extraocular movements intact.     Conjunctiva/sclera: Conjunctivae normal.     Pupils: Pupils are equal, round, and reactive to light.  Cardiovascular:     Rate and Rhythm: Normal rate and regular rhythm.     Pulses: Normal pulses.     Heart sounds: Normal heart sounds. No murmur heard. Pulmonary:     Effort: Pulmonary effort is normal. No respiratory distress.     Breath sounds: Normal breath sounds.  Abdominal:     Palpations: Abdomen is soft.     Tenderness: There is no abdominal tenderness.  Musculoskeletal:        General: No swelling.     Cervical back: Neck supple.  Skin:    General: Skin is warm and dry.     Capillary Refill: Capillary refill takes less than 2 seconds.  Neurological:     Mental Status: He is alert.     Comments: Left sided weakness in arms and legs 3/4 out of 5. Clonus in LLE at ankle, 5+/5 strength in RUE/RLE. No visual field deficit, normal speech  Psychiatric:        Mood and Affect: Mood normal.    ED Results / Procedures / Treatments   Labs (all labs ordered are listed, but only abnormal results are displayed) Labs Reviewed  COMPREHENSIVE METABOLIC PANEL - Abnormal; Notable for the following components:      Result Value   Creatinine, Ser 1.68 (*)    Alkaline Phosphatase 144 (*)    GFR, Estimated 48 (*)    All other components within normal limits  I-STAT CHEM 8, ED - Abnormal; Notable for the following components:   BUN 22 (*)    Creatinine, Ser 1.90 (*)    Calcium, Ion 1.14 (*)    All other components within normal limits  RESP PANEL BY RT-PCR (FLU A&B, COVID) ARPGX2  PROTIME-INR  APTT  CBC  DIFFERENTIAL  ETHANOL  RAPID URINE DRUG SCREEN, HOSP PERFORMED  URINALYSIS, ROUTINE W REFLEX MICROSCOPIC  CBG MONITORING, ED  CBG MONITORING, ED    EKG None  Radiology EEG adult  Result Date: 05/24/2021 Miguel Quest, MD     05/24/2021  5:47 PM  Patient Name: Miguel Mccullough MRN: [id number] Epilepsy Attending: Charlsie Mccullough Referring Physician/Provider: Caryl Pina, MD Date: 05/24/2021 Duration: 21.35 mins Patient history:  57 y.o. 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 EEG to  evaluate for seizure Level of alertness: Awake, drowsy AEDs during EEG study: None Technical aspects: This EEG study was done with scalp electrodes positioned according to the 10-20 International system of electrode placement. Electrical activity was acquired at a sampling rate of 500Hz  and reviewed with a high frequency filter of 70Hz  and a low frequency filter of 1Hz . EEG data were recorded continuously and digitally stored. Description: The posterior dominant rhythm consists of 9 Hz activity of moderate voltage (25-35 uV) seen predominantly in posterior head regions, symmetric and reactive to eye opening and eye closing.  Drowsiness was characterized by attenuation of posterior dominant rhythm. Hyperventilation and photic stimulation were not performed.   EKG artifact was seen during the study. IMPRESSION: This study is within normal limits. No seizures or epileptiform discharges were seen throughout the recording. Miguel Mccullough   CT HEAD CODE STROKE WO CONTRAST  Result Date: 05/24/2021 CLINICAL DATA:  Code stroke. Neuro deficit, acute, stroke suspected. EXAM: CT HEAD WITHOUT CONTRAST TECHNIQUE: Contiguous axial images were obtained from the base of the skull through the vertex without intravenous contrast. RADIATION DOSE REDUCTION: This exam was performed according to the departmental dose-optimization program which includes automated exposure control, adjustment of the mA and/or kV according to patient size and/or use of iterative reconstruction technique. COMPARISON:   CT and MRI studies 04/14/2021 FINDINGS: Brain: No focal abnormality seen affecting the brainstem or cerebellum. Previously seen intraparenchymal hemorrhage in the right thalamus is no longer hyperdense. No evidence of residual hyperdense intraventricular blood products. Old infarction in the basal ganglia, right larger than left. No sign of new stroke, mass, hemorrhage, hydrocephalus or extra-axial collection. Vascular: No abnormal vascular finding. Skull: Negative Sinuses/Orbits: Clear/normal Other: None ASPECTS (Alberta Stroke Program Early CT Score) - Ganglionic level infarction (caudate, lentiform nuclei, internal capsule, insula, M1-M3 cortex): 7 - Supraganglionic infarction (M4-M6 cortex): 3 Total score (0-10 with 10 being normal): 10 IMPRESSION: 1. No new or acute insult. Previously seen hemorrhage within the right thalamus is no longer hyperdense. Low-density present in that region consistent with prior stroke. Old strokes in the basal ganglia right larger than left. 2. ASPECTS is 10 3. These results were communicated to Dr. Otelia Mccullough At 4:25 pm on 05/24/2021 by text page via the [facility] messaging system. Electronically Signed   By: Miguel Mccullough M.D.   On: 05/24/2021 16:27   CT ANGIO HEAD NECK W WO CM (CODE STROKE)  Result Date: 05/24/2021 CLINICAL DATA:  Follow-up stroke. Previous right thalamic hemorrhage. EXAM: CT ANGIOGRAPHY HEAD AND NECK TECHNIQUE: Multidetector CT imaging of the head and neck was performed using the standard protocol during bolus administration of intravenous contrast. Multiplanar CT image reconstructions and MIPs were obtained to evaluate the vascular anatomy. Carotid stenosis measurements (when applicable) are obtained utilizing NASCET criteria, using the distal internal carotid diameter as the denominator. RADIATION DOSE REDUCTION: This exam was performed according to the departmental dose-optimization program which includes automated exposure control, adjustment of the mA and/or kV  according to patient size and/or use of iterative reconstruction technique. CONTRAST:  75mL OMNIPAQUE IOHEXOL 350 MG/ML SOLN COMPARISON:  Head CT earlier same day. CT studies February 26th and February 27th of this year. FINDINGS: CTA NECK FINDINGS Aortic arch: Normal.  Branching pattern is normal. Right carotid system: Common carotid artery widely patent to the bifurcation. Carotid bifurcation is normal without soft or calcified plaque. Cervical ICA is normal. Left carotid system: Left carotid system similarly normal. Vertebral arteries: Both vertebral artery origins are widely patent. Both  vertebral arteries are normal through the cervical region to the foramen magnum. Skeleton: Ordinary cervical spondylosis. Scoliotic curvature. Other neck: No mass or lymphadenopathy. 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 Upper chest: Normal Review of the MIP images confirms the above findings CTA HEAD FINDINGS Anterior circulation: Both internal carotid arteries are widely patent through the skull base and siphon regions. The anterior and middle cerebral vessels are patent. No large vessel occlusion. Mild irregularity of the M1 segments without flow limiting stenosis. Mild distal vessel atherosclerotic irregularity. Posterior circulation: Both vertebral arteries are widely patent to the basilar. No basilar stenosis. Posterior circulation branch vessels are patent. Mild atherosclerotic irregularity of the PCA branches. Venous sinuses: Patent and normal. Anatomic variants: None significant. Review of the MIP images confirms the above findings IMPRESSION: No acute vascular finding. No large vessel occlusion or flow limiting proximal stenosis. Atherosclerotic irregularity of the M1 segments and the distal branch vessels of both the anterior and posterior circulations. Electronically Signed   By: Miguel Mccullough M.D.   On: 05/24/2021 16:54    Procedures Procedures    Medications Ordered in ED Medications  sodium chloride flush  (NS) 0.9 % injection 3 mL (3 mLs Intravenous Given 05/24/21 1722)  LORazepam (ATIVAN) injection 2 mg (2 mg Intravenous Not Given 05/24/21 1723)  iohexol (OMNIPAQUE) 350 MG/ML injection 75 mL (75 mLs Intravenous Contrast Given 05/24/21 1646)    ED Course/ Medical Decision Making/ A&P                           Medical Decision Making Amount and/or Complexity of Data Reviewed Labs: ordered. Radiology: ordered.  Risk Prescription drug management. Decision regarding hospitalization.   Miguel Mccullough is here as a code stroke.  Patient hypertensive but otherwise unremarkable vitals.  History of head bleed, hypertension.  Head bleed left him with left-sided weakness.  Patient had episode of unresponsiveness, right-sided weakness, aphasia.  Seem to have resolved by the time he got to triage but had this episode occur again.  Upon my evaluation he is staring off with some saliva/foaming at the mouth with some bruxism.  Unable to follow commands.  Was given 2 mg of IV Ativan as concern for seizure.  Dr. Otelia Mccullough with neurology came down and shortly after given Ativan patient was awake and alert and following commands and participate in exam.  He did not have any obvious right-sided weakness.  Appeared to have basically baseline weakness in the left upper arm and left lower extremity.  Overall atypical for seizure but possible.  Head CT and CTA of head and neck were overall unremarkable.  Patient had a EEG done that showed no seizure-like activity.  He has been back at his baseline now.  Lab work was obtained and per my review and interpretation shows no significant anemia, electrolyte abnormality, kidney injury, leukocytosis.  EKG per my review and interpretation shows sinus rhythm.  No ischemic changes.  Overall neurology recommends admission for MRI and TIA work-up.  We will continue to observe for any seizure activity.  Hemodynamically stable throughout my care.  Admitted to medicine.  This chart was dictated  using voice recognition software.  Despite best efforts to proofread,  errors can occur which can change the documentation meaning.         Final Clinical Impression(s) / ED Diagnoses Final diagnoses:  TIA (transient ischemic attack)    Rx / DC Orders ED Discharge Orders     None  Virgina Norfolk, DO 05/24/21 1915    Virgina Norfolk, DO 05/24/21 1915

## 2021-05-24 NOTE — Assessment & Plan Note (Signed)
-  Continue citalopram 

## 2021-05-25 ENCOUNTER — Observation Stay (HOSPITAL_BASED_OUTPATIENT_CLINIC_OR_DEPARTMENT_OTHER): Payer: Self-pay

## 2021-05-25 DIAGNOSIS — G459 Transient cerebral ischemic attack, unspecified: Secondary | ICD-10-CM

## 2021-05-25 LAB — TSH: TSH: 3.456 u[IU]/mL (ref 0.350–4.500)

## 2021-05-25 LAB — ECHOCARDIOGRAM COMPLETE
Area-P 1/2: 2.69 cm2
Height: 64 in
S' Lateral: 2.3 cm
Weight: 2880 oz

## 2021-05-25 LAB — HEMOGLOBIN A1C
Hgb A1c MFr Bld: 5.1 % (ref 4.8–5.6)
Mean Plasma Glucose: 99.67 mg/dL

## 2021-05-25 LAB — LIPID PANEL
Cholesterol: 101 mg/dL (ref 0–200)
HDL: 47 mg/dL (ref >40)
LDL Cholesterol: 49 mg/dL (ref 0–99)
Total CHOL/HDL Ratio: 2.1 RATIO
Triglycerides: 24 mg/dL (ref <150)
VLDL: 5 mg/dL (ref 0–40)

## 2021-05-25 MED ORDER — OSMOLITE 1.5 CAL PO LIQD: 1000.0000 mL | ORAL | Status: DC

## 2021-05-25 MED ORDER — ASPIRIN 81 MG PO TBEC
81.0000 mg | DELAYED_RELEASE_TABLET | Freq: Every day | ORAL | 2 refills | Status: AC
  Filled 2021-05-25 – 2021-08-27 (×4): qty 90, 90d supply, fill #0

## 2021-05-25 MED ORDER — HYDROCHLOROTHIAZIDE 12.5 MG PO TABS
12.5000 mg | ORAL_TABLET | Freq: Every day | ORAL | 2 refills | Status: DC
Start: 2021-05-26 — End: 2021-07-28
  Filled 2021-05-25: qty 30, 30d supply, fill #0
  Filled 2021-06-22: qty 30, 30d supply, fill #1
  Filled 2021-07-09 – 2021-07-21 (×2): qty 30, 30d supply, fill #2

## 2021-05-25 MED ORDER — LEVETIRACETAM 500 MG PO TABS
500.0000 mg | ORAL_TABLET | Freq: Two times a day (BID) | ORAL | Status: DC
  Administered 2021-05-25: 500 mg via ORAL
  Filled 2021-05-25: qty 1

## 2021-05-25 MED ORDER — HYDRALAZINE HCL 100 MG PO TABS
100.0000 mg | ORAL_TABLET | Freq: Three times a day (TID) | ORAL | 2 refills | Status: DC
  Filled 2021-05-25 – 2021-06-09 (×2): qty 90, 30d supply, fill #0
  Filled 2021-07-09: qty 90, 30d supply, fill #1
  Filled 2021-08-07: qty 90, 30d supply, fill #2

## 2021-05-25 MED ORDER — LEVETIRACETAM 500 MG PO TABS
500.0000 mg | ORAL_TABLET | Freq: Two times a day (BID) | ORAL | 2 refills | Status: DC
  Filled 2021-05-25: qty 60, 30d supply, fill #0

## 2021-05-25 NOTE — Progress Notes (Signed)
SLP Cancellation Note ? ?Patient Details ?Name: Miguel Mccullough ?MRN: [id number] ?DOB: Jul 11, 1965 ? ? ?Cancelled treatment:       Pt passed [facility] swallow screen and pt is tolerating POs well per RN, therefore no SLP swallow eval warranted per protocol.  Our service will s/o. ? ?Anakaren Campion L. Nyanna Heideman, MA CCC/SLP ?Acute Rehabilitation Services ?Office number [phone] ?Pager [phone] ? ?[name] ?05/25/2021, 9:04 AM ?

## 2021-05-25 NOTE — Evaluation (Signed)
Occupational Therapy Evaluation ?Patient Details ?Name: Miguel Mccullough ?MRN: [id number] ?DOB: [birth year] ?Today's Date: 05/25/2021 ? ? ?History of Present Illness Pt is a 56 y.o. male who presented to the ED with right sided weakness and unresponsiveness.  CT and MRI negative. Concerning for TIA. 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 ?Clinical Impression ?  ?Miguel Mccullough was evaluated s/p the above admission list. Prior to his recent [place] he was completely indep, however since his recent d/c from [facility] he was needing min A for ADLs and functional mobility. He lives in a home with his wife who is able to provide 24/7 direct physical assist. Upon arrival pt was in chair with L lateral lean but seemingly unaware. Overall he required mod A for simple transfers with RW and up to mod A for ADLs. He is limited by L sided hemiparesis, blurry vision and impaired cognition (see cognition section below). Pt will benefit from OT acutely. Anticipate pt progresses to a min A level with continued therapy acutely, recommend d/c to home and OP OT.  ?   ? ?Recommendations for follow up therapy are one component of a multi-disciplinary discharge planning process, led by the attending physician.  Recommendations may be updated based on patient status, additional functional criteria and insurance authorization.  ? ?Follow Up Recommendations ? Outpatient OT (Per pt and family requrest: home with incrased frequency of OP visits)  ?  ?Assistance Recommended at Discharge Frequent or constant Supervision/Assistance  ?Patient can return home with the following A lot of help with walking and/or transfers;A lot of help with bathing/dressing/bathroom;Assistance with cooking/housework;Assistance with feeding;Direct supervision/assist for medications management;Direct supervision/assist for financial management;Assist for transportation;Help with stairs or ramp for entrance ? ?  ?Functional  Status Assessment ? Patient has had a recent decline in their functional status and demonstrates the ability to make significant improvements in function in a reasonable and predictable amount of time.  ?Equipment Recommendations ? None recommended by OT  ?  ?   ?Precautions / Restrictions Precautions ?Precautions: Fall;Other (comment) ?Precaution Comments: L hemiparesis from recent ICH ?Required Braces or Orthoses: Other Brace ?Other Brace: AFO LLE ?Restrictions ?Weight Bearing Restrictions: No  ? ?  ? ?Mobility Bed Mobility ?  ?  ?  ?  ?  ?  ?  ?General bed mobility comments: in chair upon arrival ?  ? ?Transfers ?Overall transfer level: Needs assistance ?Equipment used: 1 person hand held assist, Rolling walker (2 wheels) ?Transfers: Sit to/from Stand, Bed to chair/wheelchair/BSC ?Sit to Stand: Mod assist ?  ?  ?  ?  ?  ?  ?  ? ?  ?Balance Overall balance assessment: Needs assistance ?Sitting-balance support: Feet supported, No upper extremity supported ?Sitting balance-Leahy Scale: Fair ?Sitting balance - Comments: L lean ?Postural control: Left lateral lean ?  ?  ?  ?  ?  ?  ?  ?  ?  ?  ?  ?  ?  ?  ?   ? ?ADL either performed or assessed with clinical judgement  ? ?ADL Overall ADL's : Needs assistance/impaired ?Eating/Feeding: Minimal assistance;Sitting ?  ?Grooming: Minimal assistance;Sitting ?  ?Upper Body Bathing: Moderate assistance;Sitting ?  ?Lower Body Bathing: Moderate assistance;Sit to/from stand ?  ?Upper Body Dressing : Minimal assistance;Sitting ?  ?Lower Body Dressing: Moderate assistance;Sit to/from stand ?  ?Toilet Transfer: Moderate assistance;Stand-pivot;BSC/3in1;Rolling walker (2 wheels) ?  ?Toileting- Clothing Manipulation and Hygiene: Min guard;Sitting/lateral lean ?  ?  ?  ?  Functional mobility during ADLs: Moderate assistance;Rolling walker (2 wheels) ?General ADL Comments: limited by L hemi and impaired cognition  ? ? ? ?Vision Baseline Vision/History: 1 Wears glasses ?Ability to See in  Adequate Light: 1 Impaired ?Patient Visual Report: Blurring of vision ?Vision Assessment?: Yes ?Eye Alignment: Within Functional Limits ?Ocular Range of Motion: Within Functional Limits ?Alignment/Gaze Preference: Within Defined Limits ?Tracking/Visual Pursuits: Impaired - to be further tested in functional context ?Saccades: Decreased speed of saccadic movement;Impaired - to be further tested in functional context ?Convergence: Impaired (comment) ?Visual Fields: No apparent deficits ?Additional Comments: pt reports new blurry vision that started 3-4 days ago. Able to read close and far print, but states "it's just blurry." L eye seemed to have extra shifts and a delay in tracking. fields and simultaneous stimulation WFL  ?   ?Perception Perception ?Comments: very mild ?  ?Praxis   ?  ? ?Pertinent Vitals/Pain Pain Assessment ?Pain Assessment: No/denies pain  ? ? ? ?Hand Dominance Right ?  ?Extremity/Trunk Assessment Upper Extremity Assessment ?Upper Extremity Assessment: LUE deficits/detail ?LUE Deficits / Details: trace activation noted trhgouhout. PROM is [facility] however becoming tight at elbow and hand. wears splints, wife planning to bring them to the hospital. unable to use as a functional assist. ?LUE Sensation: decreased light touch ?LUE Coordination: decreased fine motor;decreased gross motor ?  ?Lower Extremity Assessment ?Lower Extremity Assessment: Defer to PT evaluation ?LLE Deficits / Details: spastic hemisplegia, moving in flexor synergy pattern. AFO in place ?LLE Sensation: decreased light touch;decreased proprioception ?LLE Coordination: decreased fine motor;decreased gross motor ?  ?Cervical / Trunk Assessment ?Cervical / Trunk Assessment: Normal ?  ?Communication Communication ?Communication: No difficulties ?  ?Cognition Arousal/Alertness: Awake/alert ?Behavior During Therapy: [facility] for tasks assessed/performed ?Overall Cognitive Status: Impaired/Different from baseline ?Area of Impairment: Attention,  Following commands, Safety/judgement, Problem solving ?  ?  ?  ?  ?  ?  ?  ?  ?  ?Current Attention Level: Selective ?  ?Following Commands: Follows one step commands consistently ?Safety/Judgement: Decreased awareness of deficits ?Awareness: Emergent ?Problem Solving: Slow processing, Difficulty sequencing, Requires verbal cues ?General Comments: L inattention. Mini cog: goo dnumber placement, difficulty with "ten past 11" and 2/3 delayed recall. Wife states she has noticed cog changes since last ICH ?  ?  ?General Comments  VSS on Ra ? ?  ?   ?   ? ? ?Home Living Family/patient expects to be discharged to:: Private residence ?Living Arrangements: Spouse/significant other;Children ?Available Help at Discharge: Available 24 hours/day;Family ?Type of Home: House ?Home Access: Stairs to enter ?Entrance Stairs-Number of Steps: 5 ?Entrance Stairs-Rails: Right;Left ?Home Layout: Two level;Bed/bath upstairs;1/2 bath on main level ?Alternate Level Stairs-Number of Steps: 15 ?Alternate Level Stairs-Rails: Right ?Bathroom Shower/Tub: Walk-in shower ?  ?Bathroom Toilet: Standard ?Bathroom Accessibility: Yes ?How Accessible: Accessible via walker ?Home Equipment: Wheelchair - manual;Cane - Holiday representative (2 wheels);Shower seat ?  ?Additional Comments: Pt has been staying on main level. Pt was able to traverse stairs with wife's assist to take a shower. ? Lives With: Spouse;Family ? ?  ?Prior Functioning/Environment Prior Level of Function : Needs assist ?  ?  ?  ?Physical Assist : Mobility (physical) ?Mobility (physical): Bed mobility;Transfers;Gait;Stairs ?  ?Mobility Comments: Independent/working prior to [facility] 04/13/21. Discharged home from [facility] 05/15/21. Min guard assist household ambulation with QC since d/c from [facility]. Does use w/c for community distances. OPPT eval 05/20/21. ?ADLs Comments: indep prior to last ICH. since d/c from [facility] he was needing min G -  minA for ADLs. Spinge bathing mostly, but was able to negotiate  steps with wife 1x to bath in the shower upstairs. Wife said he was getting back to IADLs with direct supervision ?  ? ?  ?  ?OT Problem List: Decreased strength;Decreased range of motion;Impaired balance (sittin

## 2021-05-25 NOTE — Hospital Course (Addendum)
Miguel Mccullough is a 56 y.o. 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  Of note patient was recently admitted from 2/26-3/2 symptoms of left sided weakness and paresis. Found to have right basal ganglia intraparenchymal hematoma with intraventricular extension on CT head and MRI brain. Had hypertensive emergency initially requiring Celviprez in ICU and discharged on 3 antihypertensives. Also had AKI with CKD3b with creatinine up to 2.7 at one point with no known prior baseline.  He was discharged to rehab and continue to have labile HTN and placed on 4 anti-hypertensives.  In the ED, patient was hypertensive up to 180/110.  He however had persistence of symptoms.  Neurology was consulted and patient was admitted to the hospital for further work-up and treatment. ? ?Assessment and Plan: ? ?Aphasia likely from TIA. ?Patient history of intraparenchymal hematoma with left hemiparesis.  Presenting with aphasia weakness concerning for TIA.  Aspirin 81 as per neurology.  This will be continued on discharge.  TSH of 3.4.  Lipid panel with HDL of 47 and LDL of 49.  Hemoglobin A1c at 5.1.  Urine drug screen was negative.  Urine analysis was negative.  Patient will continue statins.  2D echocardiogram showed preserved LV function at 65 to 70%.  MRI of the brain showed a decreased size of right thalamic intraparenchymal hemorrhage numerous chronic microhemorrhages suggestive of chronic hypertensive angiopathy.  CT angiogram of the head and neck was negative for large vessel occlusion.  CT head with hypodensity from previous intracranial hemorrhage.  Communicated with neurology prior to discharge.  Patient will also be continued on Keppra on discharge ? ?Resistant HTN  ?On  amlodipine, metoprolol, hydralazine, clonidine but was still elevated on presentation.  Patient had aldosterone renin workup outpatient that is still pending.  TSH 3.4.  Patient was started on Ritalin in the rehab which will be discontinued since it could contribute.  Has been initiated on HCTZ.  Blood pressure much better at this time.  We will continue hydrochlorothiazide low-dose on discharge. ?  ?CKD3b ?Creatinine at 1.68 on admission.  Continue to monitor.  Will need to monitor BMP in the next visit. ?  ?Major depression ?Continue citalopram ?

## 2021-05-25 NOTE — Discharge Summary (Addendum)
?Physician Discharge Summary ?  ?Patient: Miguel Mccullough MRN: [id number] DOB: 08/18/1965  ?Admit date:     05/24/2021  ?Discharge date: 05/25/2021   ?Discharge Physician: Corrie Mckusick Edra Riccardi  ? ?PCP: Pcp, No  ? ?Recommendations at discharge:  ? ?Follow-up with your primary care physician in 1 week.  Check CBC BMP magnesium in the next visit.   ?Patient has been started on hydrochlorothiazide at this admission for uncontrolled hypertension. ?Follow-up with [facility] in 8 weeks.  Ambulatory referral has been made. ? ?Discharge Diagnoses: ?Active Problems: ?  Aphasia ?  Resistant hypertension ?  Stage 3b chronic kidney disease ([place]) ?  Mild major depression ([place]) ?  History of intracranial hemorrhage ? ?Resolved Problems: ?  * No resolved hospital problems. * ? ?Hospital Course: ?Miguel Mccullough is a 56 y.o. 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  Of note patient was recently admitted from 2/26-3/2 symptoms of left sided weakness and paresis. Found to have right basal ganglia intraparenchymal hematoma with intraventricular extension on CT head and MRI brain. Had hypertensive emergency initially requiring Celviprez in ICU and discharged on 3 antihypertensives. Also had AKI with CKD3b with creatinine up to 2.7 at one point with no known prior baseline.  He was discharged to rehab and continue to have labile HTN and placed on 4 anti-hypertensives.  In the ED, patient was hypertensive up to 180/110.  He however had persistence of symptoms.  Neurology was consulted and patient was admitted to the hospital for further work-up and treatment. ? ?Assessment and Plan: ? ?Aphasia likely from TIA. ?Patient history of intraparenchymal hematoma with left hemiparesis.   Presenting with aphasia weakness concerning for TIA.  Aspirin 81 as per neurology.  This will be continued on discharge.  TSH of 3.4.  Lipid panel with HDL of 47 and LDL of 49.  Hemoglobin A1c at 5.1.  Urine drug screen was negative.  Urine analysis was negative.  Patient will continue statins.  2D echocardiogram showed preserved LV function at 65 to 70%.  MRI of the brain showed a decreased size of right thalamic intraparenchymal hemorrhage numerous chronic microhemorrhages suggestive of chronic hypertensive angiopathy.  CT angiogram of the head and neck was negative for large vessel occlusion.  CT head with hypodensity from previous intracranial hemorrhage.  Communicated with neurology prior to discharge.  Patient will also be continued on Keppra on discharge ? ?Resistant HTN  ?On amlodipine, metoprolol, hydralazine, clonidine but was still elevated on presentation.  Patient had aldosterone renin workup outpatient that is still pending.  TSH 3.4.  Patient was started on Ritalin in the rehab which will be discontinued since it could contribute.  Has been initiated on HCTZ.  Blood pressure much better at this time.  We will continue hydrochlorothiazide low-dose on discharge. ?  ?CKD3b ?Creatinine at 1.68 on admission.  Continue to monitor.  Will need to monitor BMP in the next visit. ?  ?Major depression ?Continue citalopram ? ?Consultants: Neurology ?Procedures performed: None ?Disposition: Home..  I spoke with the patient's spouse at bedside. ?Diet recommendation:  ?Discharge Diet Orders (From admission, onward)  ? ?  Start     Ordered  ? 05/25/21 0000  Diet - low sodium heart healthy       ? 05/25/21 1443  ? ?  ?  ? ?  ? ?  Cardiac diet ?DISCHARGE MEDICATION: ?Allergies as of 05/25/2021   ?No Known Allergies ?  ? ?  ?Medication List  ?  ? ?STOP taking these medications   ? ?Hydrocortisone Max St 1 % ?Generic drug: hydrocortisone cream ?  ?methylphenidate 5 MG tablet ?Commonly known as: RITALIN ?  ? ?  ? ?TAKE these  medications   ? ?acetaminophen 325 MG tablet ?Commonly known as: TYLENOL ?Take 1-2 tablets (325-650 mg total) by mouth every 4 (four) hours as needed for mild pain. ?  ?amantadine 100 MG capsule ?Commonly known as: SYMMETREL ?Take 1 capsule (100 mg total) by mouth daily. ?  ?amLODipine 10 MG tablet ?Commonly known as: NORVASC ?Take 1 tablet (10 mg total) by mouth at bedtime. ?  ?aspirin 81 MG EC tablet ?Take 1 tablet (81 mg total) by mouth daily. Swallow whole. ?Start taking on: May 26, 2021 ?  ?atorvastatin 40 MG tablet ?Commonly known as: LIPITOR ?Take 1 tablet (40 mg total) by mouth at bedtime. ?  ?baclofen 10 MG tablet ?Commonly known as: LIORESAL ?Take 1 tablet (10 mg total) by mouth 3 (three) times daily. ?  ?citalopram 20 MG tablet ?Commonly known as: CELEXA ?Take 1 tablet (20 mg total) by mouth daily. ?  ?cloNIDine 0.3 mg/24hr patch ?Commonly known as: CATAPRES - Dosed in mg/24 hr ?Place 1 patch (0.3 mg total) onto the skin once a week. ?What changed: when to take this ?  ?Cold-Eeze Lozg ?Use as directed 1 lozenge in the mouth or throat as needed (to boost the immune system). ?  ?fluticasone 50 MCG/ACT nasal spray ?Commonly known as: FLONASE ?Place 1 spray into both nostrils daily. ?  ?hydrALAZINE 100 MG tablet ?Commonly known as: APRESOLINE ?Take 1 tablet (100 mg total) by mouth every 8 (eight) hours. ?  ?hydrochlorothiazide 12.5 MG tablet ?Commonly known as: HYDRODIURIL ?Take 1 tablet (12.5 mg total) by mouth daily. ?Start taking on: May 26, 2021 ?  ?levETIRAcetam 500 MG tablet ?Commonly known as: KEPPRA ?Take 1 tablet (500 mg total) by mouth 2 (two) times daily. ?  ?magnesium oxide 400 MG tablet ?Commonly known as: MAG-OX ?Take 1 tablet (400 mg total) by mouth daily. ?  ?metoprolol tartrate 50 MG tablet ?Commonly known as: LOPRESSOR ?Take 1 & 1/2 tablets (75 mg total) by mouth 2 (two) times daily. ?  ?pantoprazole 40 MG tablet ?Commonly known as: PROTONIX ?Take 1 tablet (40 mg total) by mouth at  bedtime. ?  ?potassium chloride SA 20 MEQ tablet ?Commonly known as: KLOR-CON M ?Take 1 tablet (20 mEq total) by mouth 2 (two) times daily. ?  ?senna-docusate 8.6-50 MG tablet ?Commonly known as: Senokot-S ?Take 2 tablets by mouth at bedtime as needed for mild constipation. ?What changed:  ?when to take this ?additional instructions ?  ?traZODone 150 MG tablet ?Commonly known as: DESYREL ?Take 0.5 tablets (75 mg total) by mouth at bedtime. ?  ? ?  ? ? Follow-up Information   ? ? [facility]   ?Specialty: Rehabilitation ?Why: call office to increase frequency of scheduled appointments ?Contact information: ?[place]. ?[phone] ?[place] ?[phone] ? ?  ?  ? ?  ?  ? ?  ? ?Subjective: ?Today, patient was seen and examined at bedside.  Patient denies any headache, nausea, vomiting, fever, chills or rigor.  Blood pressure has improved. ? ?Discharge Exam: ?Filed Weights  ? 05/24/21 1655  ?Weight: 81.6 kg  ? ? ?  05/25/2021  ?  11:35 AM 05/25/2021  ?  9:39 AM 05/25/2021  ?  4:35 AM  ?Vitals with BMI  ?Systolic 242 683 419  ?Diastolic 74 60 90  ?Pulse 63 71 70  ?  ?General:  Average built, not in obvious distress ?HENT:   No scleral pallor or icterus noted. Oral mucosa is moist.  ?Chest:  Clear breath sounds.  Diminished breath sounds bilaterally. No crackles or wheezes.  ?CVS: S1 &S2 heard. No murmur.  Regular rate and rhythm. ?Abdomen: Soft, nontender, nondistended.  Bowel sounds are heard.   ?Extremities: No cyanosis, clubbing or edema.  Peripheral pulses are palpable. ?Psych: Alert, awake and oriented, normal mood ?CNS:    Aphasia has improved.  Left sided weakness noted. ?Skin: Warm and dry.  No rashes noted. ? ? ?Condition at discharge: good ? ?The results of significant diagnostics from this hospitalization (including imaging, microbiology, ancillary and laboratory) are listed below for reference.  ? ?Imaging Studies: ?MR BRAIN WO  CONTRAST ? ?Result Date: 05/24/2021 ?CLINICAL DATA:  Transient ischemic attack EXAM: MRI HEAD WITHOUT CONTRAST TECHNIQUE: Multiplanar, multiecho pulse sequences of the brain and surrounding structures were obtained

## 2021-05-25 NOTE — Progress Notes (Signed)
Echocardiogram ?2D Echocardiogram has been performed. ? ?Oneal Deputy Quamaine Webb RDCS ?05/25/2021, 11:02 AM ?

## 2021-05-25 NOTE — Progress Notes (Addendum)
STROKE TEAM PROGRESS NOTE  ? ?INTERVAL HISTORY ?His wife and daughter are at the bedside. They were shopping at [employer] yesterday and his wife took him to the bathroom. She noticed he was weak on his right side (his strong side) and had an episode of expressive aphasia. She got him to the car and the aphasia improved until they got to the hospital when it occurred again. A third episode happened in the CT scanner. This has since improved and he is currently at baseline. ASA 81mg  started.  ? ?Vitals:  ? 05/24/21 2235 05/25/21 0035 05/25/21 0235 05/25/21 0435  ?BP: (!) 166/86 135/84 123/80 125/90  ?Pulse: 78 70 70 70  ?Resp: 18 18 18 18   ?Temp: 98.2 ?F (36.8 ?C) 98.3 ?F (36.8 ?C) 98.2 ?F (36.8 ?C) 98.4 ?F (36.9 ?C)  ?TempSrc: Oral Oral Oral Oral  ?SpO2: 100% 100% 100% 100%  ?Weight:      ?Height:      ? ?CBC:  ?Recent Labs  ?Lab 05/24/21 ?1609 05/24/21 ?1641  ?WBC 8.7  --   ?NEUTROABS 6.4  --   ?HGB 14.5 14.6  ?HCT 42.6 43.0  ?MCV 88.0  --   ?PLT 253  --   ? ?Basic Metabolic Panel:  ?Recent Labs  ?Lab 05/20/21 ?1532 05/24/21 ?1609 05/24/21 ?1641  ?NA 142 140 142  ?K 4.4 4.3 4.3  ?CL 103 107 104  ?CO2 25 26  --   ?GLUCOSE 83 88 87  ?BUN 22 20 22*  ?CREATININE 1.80* 1.68* 1.90*  ?CALCIUM 9.6 9.3  --   ? ?Lipid Panel:  ?Recent Labs  ?Lab 05/25/21 ?0436  ?CHOL 101  ?TRIG 24  ?HDL 47  ?CHOLHDL 2.1  ?VLDL 5  ?[place] 49  ? ?HgbA1c:  ?Recent Labs  ?Lab 05/25/21 ?0436  ?HGBA1C 5.1  ? ?Urine Drug Screen:  ?Recent Labs  ?Lab 05/24/21 ?2016  ?LABOPIA NONE DETECTED  ?COCAINSCRNUR NONE DETECTED  ?LABBENZ NONE DETECTED  ?AMPHETMU NONE DETECTED  ?THCU NONE DETECTED  ?LABBARB NONE DETECTED  ?  ?Alcohol Level  ?Recent Labs  ?Lab 05/24/21 ?1615  ?ETH <10  ? ? ?IMAGING past 24 hours ?MR BRAIN WO CONTRAST ? ?Result Date: 05/24/2021 ?CLINICAL DATA:  Transient ischemic attack EXAM: MRI HEAD WITHOUT CONTRAST TECHNIQUE: Multiplanar, multiecho pulse sequences of the brain and surrounding structures were obtained without intravenous contrast.  COMPARISON:  05/24/2021 head CT FINDINGS: Brain: There is intraparenchymal hemorrhage in the right thalamus with hyperintense T1 and T2-weighted signal, with decreased size of the hematoma compared to 04/14/2021. There is minimal associated edema. There are numerous chronic microhemorrhages in the cerebrum, brainstem and cerebellum. There is multifocal hyperintense T2-weighted signal within the white matter. Generalized volume loss without a clear lobar predilection. Old right basal ganglia small vessel infarct. The midline structures are normal. No acute infarct. Vascular: Major flow voids are preserved. Skull and upper cervical spine: Normal calvarium and skull base. Visualized upper cervical spine and soft tissues are normal. Sinuses/Orbits:No paranasal sinus fluid levels or advanced mucosal thickening. No mastoid or middle ear effusion. Normal orbits. IMPRESSION: 1. Decreased size of right thalamic intraparenchymal hemorrhage with minimal residual edema. 2. No acute ischemia. 3. Numerous chronic microhemorrhages in the cerebrum, brainstem and cerebellum, consistent with chronic hypertensive angiopathy. 4. Chronic ischemic microangiopathy and generalized volume loss. Electronically Signed   By: Ulyses Jarred M.D.   On: 05/24/2021 19:53  ? ?EEG adult ? ?Result Date: 05/24/2021 ?Lora Havens, MD     05/24/2021  5:47 PM Patient  Name: NAITHAN DELAGE MRN: [id number] Epilepsy Attending: Lora Havens Referring Physician/Provider: Kerney Elbe, MD Date: 05/24/2021 Duration: 21.35 mins Patient history:  56 y.o. 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 EEG to evaluate for seizure Level of alertness: Awake, drowsy AEDs during EEG study: None Technical aspects: This EEG study was done with scalp electrodes positioned  according to the 10-20 International system of electrode placement. Electrical activity was acquired at a sampling rate of 500Hz  and reviewed with a high frequency filter of 70Hz  and a low frequency filter of 1Hz . EEG data were recorded continuously and digitally stored. Description: The posterior dominant rhythm consists of 9 Hz activity of moderate voltage (25-35 uV) seen predominantly in posterior head regions, symmetric and reactive to eye opening and eye closing.  Drowsiness was characterized by attenuation of posterior dominant rhythm. Hyperventilation and photic stimulation were not performed.   EKG artifact was seen during the study. IMPRESSION: This study is within normal limits. No seizures or epileptiform discharges were seen throughout the recording. Priyanka Barbra Sarks  ? ?CT HEAD CODE STROKE WO CONTRAST ? ?Result Date: 05/24/2021 ?CLINICAL DATA:  Code stroke. Neuro deficit, acute, stroke suspected. EXAM: CT HEAD WITHOUT CONTRAST TECHNIQUE: Contiguous axial images were obtained from the base of the skull through the vertex without intravenous contrast. RADIATION DOSE REDUCTION: This exam was performed according to the departmental dose-optimization program which includes automated exposure control, adjustment of the mA and/or kV according to patient size and/or use of iterative reconstruction technique. COMPARISON:  CT and MRI studies 04/14/2021 FINDINGS: Brain: No focal abnormality seen affecting the brainstem or cerebellum. Previously seen intraparenchymal hemorrhage in the right thalamus is no longer hyperdense. No evidence of residual hyperdense intraventricular blood products. Old infarction in the basal ganglia, right larger than left. No sign of new stroke, mass, hemorrhage, hydrocephalus or extra-axial collection. Vascular: No abnormal vascular finding. Skull: Negative Sinuses/Orbits: Clear/normal Other: None ASPECTS ([place] Stroke Program Early CT Score) - Ganglionic level infarction (caudate,  lentiform nuclei, internal capsule, insula, M1-M3 cortex): 7 - Supraganglionic infarction (M4-M6 cortex): 3 Total score (0-10 with 10 being normal): 10 IMPRESSION: 1. No new or acute insult. Previously seen hemorrhage within the right thalamus is no longer hyperdense. Low-density present in that region consistent with prior stroke. Old strokes in the basal ganglia right larger than left. 2. ASPECTS is 10 3. These results were communicated to Dr. Cheral Marker At 4:25 pm on 05/24/2021 by text page via the [facility] messaging system. Electronically Signed   By: Nelson Chimes M.D.   On: 05/24/2021 16:27  ? ?CT ANGIO HEAD NECK W WO CM (CODE STROKE) ? ?Result Date: 05/24/2021 ?CLINICAL DATA:  Follow-up stroke. Previous right thalamic hemorrhage. EXAM: CT ANGIOGRAPHY HEAD AND NECK TECHNIQUE: Multidetector CT imaging of the head and neck was performed using the standard protocol during bolus administration of intravenous contrast. Multiplanar CT image reconstructions and MIPs were obtained to evaluate the vascular anatomy. Carotid stenosis measurements (when applicable) are obtained utilizing NASCET criteria, using the distal internal carotid diameter as the denominator. RADIATION DOSE REDUCTION: This exam was performed according to the departmental dose-optimization program which includes automated exposure control, adjustment of the mA and/or kV according to patient size and/or use of iterative reconstruction technique. CONTRAST:  59mL OMNIPAQUE IOHEXOL 350 MG/ML SOLN COMPARISON:  Head CT earlier same day. CT studies February 26th and February 27th of this year. FINDINGS: CTA NECK FINDINGS Aortic arch: Normal.  Branching pattern is normal. Right carotid system: Common carotid artery widely patent to the bifurcation. Carotid bifurcation is normal without soft or calcified plaque. Cervical ICA is normal. Left carotid system: Left carotid system similarly normal. Vertebral arteries: Both vertebral artery origins are widely patent. Both  vertebral arteries are normal through the cervical region to the foramen magnum. Skeleton: Ordinary cervical spondylosis. Scoliotic curvature. Other neck: No mass or lymphadenopathy. Previous thyroidectomy on t

## 2021-05-25 NOTE — Evaluation (Signed)
Physical Therapy Evaluation ?Patient Details ?Name: Miguel Mccullough ?MRN: [id number] ?DOB: [birth year] ?Today's Date: 05/25/2021 ? ?History of Present Illness ? Pt is a 56 y.o. male who presented to the ED with right sided weakness and unresponsiveness.  CT and MRI negative. Concerning for TIA. 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  ?Clinical Impression ? Pt admitted with above diagnosis. PTA pt lived at home with wife, min guard household ambulation with QC and w/c for community distances. Pt currently with functional limitations due to the deficits listed below (see PT Problem List). On eval, pt required mod assist bed mobility, mod assist sit to stand, and mod assist SPT. Pt with heavy L lean in stance hindering his ability to progress gait. 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 R side symptoms and aphasia resolved/not present at time of PT eval.  Pt will benefit from skilled PT to increase their independence and safety with mobility to allow discharge to the venue listed below.  Wife present and confirms ability to provide needed level of assist at home as well as having all needed DME. Pt is currently active with [employer] and pt/wife wish to continue those follow up services at discharge. ?   ?   ? ?Recommendations for follow up therapy are one component of a multi-disciplinary discharge planning process, led by the attending physician.  Recommendations may be updated based on patient status, additional functional criteria and insurance authorization. ? ?Follow Up Recommendations Outpatient PT ? ?  ?Assistance Recommended at Discharge Frequent or constant Supervision/Assistance  ?Patient can return home with the following ? A lot of help with walking and/or transfers;A lot of help with bathing/dressing/bathroom;Assistance with cooking/housework;Assist for transportation;Help with stairs or ramp for entrance ? ?  ?Equipment  Recommendations None recommended by PT (Pt has all needed DME.)  ?Recommendations for Other Services ?    ?  ?Functional Status Assessment Patient has had a recent decline in their functional status and demonstrates the ability to make significant improvements in function in a reasonable and predictable amount of time.  ? ?  ?Precautions / Restrictions Precautions ?Precautions: Fall;Other (comment) ?Precaution Comments: L hemiparesis from recent ICH ?Required Braces or Orthoses: Other Brace ?Other Brace: AFO LLE ?Restrictions ?Weight Bearing Restrictions: No  ? ?  ? ?Mobility ? Bed Mobility ?Overal bed mobility: Needs Assistance ?Bed Mobility: Supine to Sit ?  ?  ?Supine to sit: Mod assist, HOB elevated ?  ?  ?General bed mobility comments: assist with LLE, elevation of trunk, and scooting to EOB ?  ? ?Transfers ?Overall transfer level: Needs assistance ?Equipment used: 1 person hand held assist, Rolling walker (2 wheels) ?Transfers: Sit to/from Stand, Bed to chair/wheelchair/BSC ?Sit to Stand: Mod assist ?Stand pivot transfers: Mod assist ?  ?  ?  ?  ?General transfer comment: Sit to stand with HHA on R. Heavy L lean requiring mod assist to maintain static stance. Returned to sit and then stood with RW. Weight shifting performed in stance but heavy L lean persisted. RW removed and SPT performed bed to recliner toward L. ?  ? ?Ambulation/Gait ?  ?  ?  ?  ?  ?  ?  ?  ? ?Stairs ?  ?  ?  ?  ?  ? ?Wheelchair Mobility ?  ? ?Modified Rankin (Stroke Patients Only) ?  ? ?  ? ?Balance Overall balance assessment: Needs assistance ?Sitting-balance support: Feet supported, No upper extremity  supported ?Sitting balance-Leahy Scale: Fair ?  ?  ?Standing balance support: Single extremity supported, Bilateral upper extremity supported, During functional activity ?Standing balance-Leahy Scale: Poor ?Standing balance comment: reliant on external assist ?  ?  ?  ?  ?  ?  ?  ?  ?  ?  ?  ?   ? ? ? ?Pertinent Vitals/Pain Pain  Assessment ?Pain Assessment: No/denies pain  ? ? ?Home Living Family/patient expects to be discharged to:: Private residence ?Living Arrangements: Spouse/significant other;Children ?Available Help at Discharge: Available 24 hours/day;Family ?Type of Home: House ?  ?  ?  ?Alternate Level Stairs-Number of Steps: 15 ?Home Layout: Two level;Bed/bath upstairs;1/2 bath on main level ?Home Equipment: Wheelchair - manual;Cane - Holiday representative (2 wheels);Shower seat ?Additional Comments: Pt has been staying on main level. Pt was able to traverse stairs with wife's assist to take a shower.  ?  ?Prior Function Prior Level of Function : Needs assist ?  ?  ?  ?Physical Assist : Mobility (physical) ?Mobility (physical): Bed mobility;Transfers;Gait;Stairs ?  ?Mobility Comments: Independent/working prior to [facility] 04/13/21. Discharged home from [facility] 05/15/21. Min guard assist household ambulation with QC since d/c from [facility]. Does use w/c for community distances. OPPT eval 05/20/21. ?  ?  ? ? ?Hand Dominance  ? Dominant Hand: Right ? ?  ?Extremity/Trunk Assessment  ? Upper Extremity Assessment ?Upper Extremity Assessment: Defer to OT evaluation (no active movement noted LUE) ?  ? ?Lower Extremity Assessment ?Lower Extremity Assessment: LLE deficits/detail ?LLE Deficits / Details: spastic hemisplegia, moving in flexor synergy pattern. AFO in place ?LLE Sensation: decreased light touch;decreased proprioception ?LLE Coordination: decreased fine motor;decreased gross motor ?  ? ?Cervical / Trunk Assessment ?Cervical / Trunk Assessment: Normal  ?Communication  ? Communication: No difficulties  ?Cognition Arousal/Alertness: Awake/alert ?Behavior During Therapy: [facility] for tasks assessed/performed ?Overall Cognitive Status: Impaired/Different from baseline ?Area of Impairment: Attention, Following commands, Safety/judgement, Problem solving ?  ?  ?  ?  ?  ?  ?  ?  ?  ?Current Attention Level: Selective ?  ?Following Commands: Follows one step  commands consistently ?Safety/Judgement: Decreased awareness of deficits ?  ?Problem Solving: Slow processing, Difficulty sequencing, Requires verbal cues ?General Comments: L inattention ?  ?  ? ?  ?General Comments General comments (skin integrity, edema, etc.): pt reports blurry vision L visual field. R side symptoms and aphasia resolved at time of PT eval. ? ?  ?Exercises    ? ?Assessment/Plan  ?  ?PT Assessment Patient needs continued PT services  ?PT Problem List Decreased strength;Decreased activity tolerance;Decreased range of motion;Decreased balance;Decreased mobility;Decreased coordination;Decreased cognition;Decreased knowledge of use of DME;Decreased safety awareness;Decreased knowledge of precautions;Impaired tone;Impaired sensation ? ?   ?  ?PT Treatment Interventions DME instruction;Gait training;Functional mobility training;Therapeutic activities;Therapeutic exercise;Balance training;Neuromuscular re-education;Cognitive remediation;Patient/family education;Wheelchair mobility training   ? ?PT Goals (Current goals can be found in the Care Plan section)  ?Acute Rehab PT Goals ?Patient Stated Goal: home ?PT Goal Formulation: With patient/family ?Time For Goal Achievement: 06/08/21 ?Potential to Achieve Goals: Good ? ?  ?Frequency Min 4X/week ?  ? ? ?Co-evaluation   ?  ?  ?  ?  ? ? ?  ?AM-PAC PT "6 Clicks" Mobility  ?Outcome Measure Help needed turning from your back to your side while in a flat bed without using bedrails?: A Little ?Help needed moving from lying on your back to sitting on the side of a flat bed without using bedrails?: A Lot ?Help needed moving  to and from a bed to a chair (including a wheelchair)?: A Lot ?Help needed standing up from a chair using your arms (e.g., wheelchair or bedside chair)?: A Lot ?Help needed to walk in hospital room?: Total ?Help needed climbing 3-5 steps with a railing? : Total ?6 Click Score: 11 ? ?  ?End of Session Equipment Utilized During Treatment: Gait  belt ?Activity Tolerance: Patient tolerated treatment well ?Patient left: in chair;with call bell/phone within reach;with family/visitor present ?Nurse Communication: Mobility status ?PT Visit Diagnosis: Hemiplegia and hemiparesis

## 2021-05-26 ENCOUNTER — Encounter: Payer: Self-pay | Admitting: Physical Therapy

## 2021-05-26 ENCOUNTER — Other Ambulatory Visit: Payer: Self-pay

## 2021-05-26 ENCOUNTER — Ambulatory Visit: Payer: Self-pay | Admitting: Occupational Therapy

## 2021-05-26 ENCOUNTER — Ambulatory Visit: Payer: Self-pay | Admitting: Physical Therapy

## 2021-05-26 DIAGNOSIS — I610 Nontraumatic intracerebral hemorrhage in hemisphere, subcortical: Secondary | ICD-10-CM

## 2021-05-26 DIAGNOSIS — R2681 Unsteadiness on feet: Secondary | ICD-10-CM

## 2021-05-26 DIAGNOSIS — R262 Difficulty in walking, not elsewhere classified: Secondary | ICD-10-CM

## 2021-05-26 DIAGNOSIS — R2689 Other abnormalities of gait and mobility: Secondary | ICD-10-CM

## 2021-05-26 DIAGNOSIS — R278 Other lack of coordination: Secondary | ICD-10-CM

## 2021-05-26 DIAGNOSIS — R41840 Attention and concentration deficit: Secondary | ICD-10-CM

## 2021-05-26 DIAGNOSIS — R5381 Other malaise: Secondary | ICD-10-CM

## 2021-05-26 DIAGNOSIS — I69354 Hemiplegia and hemiparesis following cerebral infarction affecting left non-dominant side: Secondary | ICD-10-CM

## 2021-05-26 DIAGNOSIS — R41841 Cognitive communication deficit: Secondary | ICD-10-CM

## 2021-05-26 NOTE — Therapy (Signed)
[facility] [place] [address] ?[place], [place], [address] ?Phone: [phone]   Fax:  [phone] ? ?Physical Therapy Treatment ? ?Patient Details  ?Name: Miguel Mccullough ?MRN: [id number] ?Date of Birth: 06/24/1965 ?Referring Provider (PT): Megan Lovorn ? ? ?Encounter Date: 05/26/2021 ? ? PT End of Session - 05/26/21 1815   ? ? Visit Number 2   ? Date for PT Re-Evaluation 07/28/21   ? PT Start Time 1546   ? PT Stop Time 1628   ? PT Time Calculation (min) 42 min   ? Equipment Utilized During Treatment Gait belt   ? Activity Tolerance Patient tolerated treatment well   ? Behavior During Therapy [facility] for tasks assessed/performed   ? ?  ?  ? ?  ? ? ?Past Medical History:  ?Diagnosis Date  ? COVID-19 2021  ? continues to have increase in suptum production.  ? Hypertension   ? ? ?History reviewed. No pertinent surgical history. ? ?There were no vitals filed for this visit. ? ? Subjective Assessment - 05/26/21 1807   ? ? Subjective Patient reports that he had to return to the hospital over the weekend due to new onset of RSW and aphasia. He was diagnosed with a TIA, and symptoms have largely resolved, but he still feels like his R side is a bit weaker than it had been.   ? Patient Stated Goals be able to walk again, get left leg moving   ? Currently in Pain? No/denies   ? ?  ?  ? ?  ? ? ? ? ? ? ? ? ? ? ? ? ? ? ? ? ? ? ? ? [facility] Adult PT Treatment/Exercise - 05/26/21 0001   ? ?  ? Neuro Re-ed   ? Neuro Re-ed Details  Therapsit facilitated L side activation, first in L sidelie, moving in rolling over the L side to increase WB through hip and scapula. he then rolled to his R side and therapist facilitated L scapular and upper quadrant movement, L pelvic and lower quadrant mobility, hip abd, hip abd with forward and back swing, and rolling back<> R side. He required up to mod A for all movement, but did show active L abdominals, and pelvic stabilizers. He moved to sit on side of mat  and practiced sit <> stand with min A to facilitate WB through LLE. He was able to push through LLE at initiation of sit to stand, but as he rose up, he shifted to his R and L side shut down. Moved to flexed posture in partial stand with weight shifts, with improved maintainence of LLE activation. Also practiced psuhing through red Physioball in Supine, with LLE while holding RLE off mat, mod-max facilitation to activate.   ?  ? Exercises  ? Exercises Knee/Hip   ?  ? Knee/Hip Exercises: Aerobic  ? Nustep Pedalled Nu-Step at L6-8 resistance, with strap around knees to maintain appropriate L hip position, occasional min A, but he was able to consistently activate the  LLE to push.   ? ?  ?  ? ?  ? ? ? ? ? ? ? ? ? ? PT Education - 05/26/21 1815   ? ? Education Details Educated patient to the improtance of utilizing LLE in all activities in order to re-train it to be functional.   ? Person(s) Educated Patient   ? Methods Explanation   ? Comprehension Verbalized understanding   ? ?  ?  ? ?  ? ? ?  PT Short Term Goals - 05/26/21 1819   ? ?  ? PT SHORT TERM GOAL #1  ? Title Will be able to perform bed mobility with no more than S level assist   ? Baseline CGA   ? Time 4   ? Period Weeks   ? Status On-going   ? Target Date 06/23/21   ?  ? PT SHORT TERM GOAL #2  ? Title Will be able to perform functional stand and squat pivot transfers with no more than S level assist   ? Baseline CGA   ? Time 4   ? Period Weeks   ? Status On-going   ?  ? PT SHORT TERM GOAL #3  ? Title Will be able to maintain static standing for at least 5 minutes in complete midline with equal WB each LE   ? Time 4   ? Period Weeks   ? Status On-going   ?  ? PT SHORT TERM GOAL #4  ? Title Will appropriately attend to and recognize visual stimulation on L side on 4/5 attempts to show improving L inattention   ? Time 4   ? Period Weeks   ? Status On-going   ?  ? PT SHORT TERM GOAL #5  ? Title Will be able to gait train at least 41ft with LRAD and no more than  min guard assist   ? Time 4   ? Period Weeks   ? Status On-going   ? ?  ?  ? ?  ? ? ? ? PT Long Term Goals - 05/19/21 1736   ? ?  ? PT LONG TERM GOAL #1  ? Title MMT to improve by 1 grade in all weak groups in BLEs   ? Time 10   ? Period Weeks   ? Status New   ? Target Date 07/28/21   ?  ? PT LONG TERM GOAL #2  ? Title Will be able to gait train at least 1105ft with LRAD and no more than distant S assist   ? Time 10   ? Period Weeks   ? Status New   ?  ? PT LONG TERM GOAL #3  ? Title Will be able to navigate at least 4 steps with U rail and no more than Min guard assistance   ? Time 10   ? Period Weeks   ? Status New   ?  ? PT LONG TERM GOAL #4  ? Title Will be able to perform all bed mobility and functional transfers on a Mod(I) level with LRAD   ? Time 10   ? Period Weeks   ? Status New   ?  ? PT LONG TERM GOAL #5  ? Title Will be able to complete TUG in 25 seconds or less with LRAD to show improved functional balance   ? Time 10   ? Period Weeks   ? Status New   ? ?  ?  ? ?  ? ? ? ? ? ? ? ? Plan - 05/26/21 1816   ? ? Clinical Impression Statement Patient reported episode of hospitalization over the weekend due to R WS and aphasia. He was diagnosed with a TIA and the symptoms largely resolved, but patient does not yet feel back to his normal. Treatment focused on activation of L side in order to improve his awareness and functional use of L side. he participated well, has already started to developed compensations using R  side for all mobility and will need continued emphasis on incorporating the L side.   ? Personal Factors and Comorbidities Finances;Comorbidity 2;Age   ? Examination-Activity Limitations Locomotion Level;Transfers;Bed Mobility;Sit;Squat;Stairs;Stand   ? Examination-Participation Restrictions Community Activity;Driving;Shop;Yard Work   ? Stability/Clinical Decision Making Evolving/Moderate complexity   ? Clinical Decision Making Moderate   ? Rehab Potential Good   ? PT Frequency 1x / week   ? PT  Duration Other (comment)   10w  ? PT Treatment/Interventions ADLs/Self Care Home Management;Biofeedback;Cryotherapy;Electrical Stimulation;DME Instruction;Gait training;Stair training;Functional mobility training;Therapeutic activities;Therapeutic exercise;Balance training;Neuromuscular re-education;Cognitive remediation;Patient/family education;Wheelchair mobility training;Manual techniques;Energy conservation;Taping;Visual/perceptual remediation/compensation   ? PT Next Visit Plan needs more specific HEP; work on functional mobility in general, really need to focus on improving weight shift over L LE and L attention   ? PT Home Exercise Plan sitting/stanidng in midline at home with family/RW using mirror   ? Consulted and Agree with Plan of Care Patient   ? ?  ?  ? ?  ? ? ?Patient will benefit from skilled therapeutic intervention in order to improve the following deficits and impairments:  Abnormal gait, Decreased coordination, Difficulty walking, Increased fascial restricitons, Cardiopulmonary status limiting activity, Impaired UE functional use, Decreased activity tolerance, Impaired vision/preception, Decreased balance, Decreased knowledge of use of DME, Decreased cognition, Decreased mobility, Decreased strength, Impaired sensation, Postural dysfunction ? ?Visit Diagnosis: ?Hemiplegia and hemiparesis following cerebral infarction affecting left non-dominant side ([place]) ? ?Attention and concentration deficit ? ?Other abnormalities of gait and mobility ? ?Cognitive communication deficit ? ?Other lack of coordination ? ?Difficulty in walking, not elsewhere classified ? ?Unsteadiness on feet ? ?Debility ? ?Basal ganglia hemorrhage (H[facility]) ? ? ? ? ?Problem List ?Patient Active Problem List  ? Diagnosis Date Noted  ? AMS (altered mental status) 05/24/2021  ? Aphasia 05/24/2021  ? History of intracranial hemorrhage 05/24/2021  ? 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 Stage 3b chronic kidney disease ([place]) 05/20/2021  ?  Hypokalemia 05/20/2021  ? 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 Acute renal failure ([place]) 05/15/2021  ? Elevated lipids 05/15/2021  ? Spasticity 05/15/2021  ? Depression 05/15/2021  ? Constipation 03/3

## 2021-05-27 NOTE — Therapy (Signed)
?OUTPATIENT OCCUPATIONAL THERAPY TREATMENT NOTE ? ? ?Patient Name: Miguel Mccullough ?MRN: [id number] ?DOB:07-21-1965, 56 y.o., male ?Today's Date: 05/27/2021 ? ?PCP: Pcp, No ?REFERRING PROVIDER: Courtney Heys, MD ? ?END OF SESSION: ? ? OT End of Session - 05/26/21 1657   ? ? Visit Number 2   ? Date for OT Re-Evaluation 08/20/21   ? Authorization Type self pay; has applied for Medicaid   ? OT Start Time 1530   ? OT Stop Time 7867   ? OT Time Calculation (min) 47 min   ? Activity Tolerance Patient tolerated treatment well   ? Behavior During Therapy [facility] for tasks assessed/performed   ? ?  ?  ? ?  ? ?Past Medical History:  ?Diagnosis Date  ? COVID-19 2021  ? continues to have increase in suptum production.  ? Hypertension   ? ?No past surgical history on file. ?Patient Active Problem List  ? Diagnosis Date Noted  ? AMS (altered mental status) 05/24/2021  ? Aphasia 05/24/2021  ? History of intracranial hemorrhage 05/24/2021  ? 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 Stage 3b chronic kidney disease ([place]) 05/20/2021  ? Hypokalemia 05/20/2021  ? Mild major depression ([place]) 05/20/2021  ? Acute renal failure ([place]) 05/15/2021  ? Elevated lipids 05/15/2021  ? Spasticity 05/15/2021  ? Depression 05/15/2021  ? Constipation 05/15/2021  ? ICH (intracerebral hemorrhage) ([place]) 04/13/2021  ? ? ?ONSET DATE: 04/13/21 ? ?REFERRING DIAG: R CVA ? ?THERAPY DIAG:  ?Hemiplegia and hemiparesis following cerebral infarction affecting left non-dominant side ([place]) ? ?Attention and concentration deficit ? ?Other abnormalities of gait and mobility ? ? 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 Transferred to [facility] 3/2-3/30/23. PMH includes stage III CKD, depression, and HTN ? ?PRECAUTIONS: Fall ? ?SUBJECTIVE:  ?Pt reports he discharged from the hospital yesterday for what they believe was either a seizure or a TIA. He went to the ED the day  before for R-sided weakness and decreased responsiveness. States he has been feeling okay since being home, but does feel a little more weak. ? ?PAIN:  ?Are you having pain? No ? ? ?OBJECTIVE:  ? ?TODAY'S TREATMENT: ? ?05/26/21 ?Weight bearing while standing at countertop, completing 2x10 gentle weight shifting toward affected side w/ L hand positioned on counter, releasing UE support to tap target at head height w/ contralateral RUE and then weight shifting back-and-forth laterally x10. Required assist maintaining L hand positioning on countertop during weight shifting w/ OT also providing blocking of L knee flexion for increased stability. Pt required increased cues to shift toward affected hemi body. ?RUE NMR in supine w/ OT facilitating elbow flex/ext, shoulder flex/ext, shoulder int rot, and attempting shoulder ext rot. Towel roll positioned under UE for scapular alignment w/ pt demonstrating most success in gravity assisted and minimized positions as well as w/ activating muscle groups from neutral alignment. Able to achieve fair elbow ext and shoulder ext, good int rot, and poor shoulder and elbow flex. In gravity-minimized position, pt was also able to achieve good forearm and wrist AROM. ?UE Ranger for AAROM of L shoulder flex/ext w/ elbow at 90 degrees; completed 15x each w/ OT providing min support to decrease demand on UE. Pt able to achieve approx 10% of ROM actively. ? ? ?PATIENT EDUCATION: ?Education details: OT continued condition-specific education, particularly regarding approaches to therapy (i.e., restoration, compensation and modification) across the phases of recovery ?Person educated: Patient ?Education method:  Explanation ?Education comprehension: verbalized understanding ?  ? ?HOME EXERCISE PROGRAM: ?To be administered  ?  ?  ?GOALS: ?Goals reviewed with patient? Yes ?  ?SHORT TERM GOALS: Target date: 06/27/2021 ?  ?STG   Baseline Goal Status  ?1 Pt will demonstrate independence w/ initial HEP  designed for NMR and stretching of LUE N/A IN PROGRESS  ?2 Pt will be able to complete a task in standing for at least 5 min at CGA to improve participation in IADL tasks Decreased standing tolerance/balance IN PROGRESS  ?3 Pt will be able to cut food w/ Mod I, using AE prn Unable to cut tough food IN PROGRESS  ?  ?LONG TERM GOALS: Target date: 08/01/2021 ?  ?LTG   Baseline Goal Status  ?1 Pt will be able to complete LB dressing at setup assist, incorporating compensatory strategies/AE prn Mod A IN PROGRESS  ?2 Pt will be able to complete meal prep activity w/ Mod I by d/c Able to complete simple snack prep IN PROGRESS  ?3 Pt will demonstrate completion of simulated medication management activity at [facility] for safety Min A IN PROGRESS  ?4 Pt will be able to safely complete simulated tub/shower transfer at [place]  ?5 FOTO goal -- to be assessed TBD IN PROGRESS  ?  ?ASSESSMENT: ?  ?CLINICAL IMPRESSION: ?Pt returns for treatment session following initial evaluation last week. OT reviewed all goals w/ pt who is agreeable to POC at this time. OT then focused on NMR of LUE, first addressing activation in supine to facilitate scapular alignment w/ retraction during UE movement. More activation, particularly w/ forearm, wrist, and hand movement observed this session compared to assessment during evaluation, likely due to improved alignment and positioning proximally, as well as impact on gravity assist and minimized position. Pt also demonstrated fair gross grasp and release this session and may benefit from NMES to encourage increased muscle activation. ?  ?PERFORMANCE DEFICITS in functional skills including ADLs, IADLs, coordination, dexterity, proprioception, sensation, edema, tone, ROM, strength, FMC, GMC, mobility, balance, body mechanics, decreased knowledge of precautions, and decreased knowledge of use of DME, cognitive skills including attention, memory, perception, problem solving, safety awareness,  and sequencing, and psychosocial skills including environmental adaptation.  ?  ?IMPAIRMENTS are limiting patient from ADLs, IADLs, rest and sleep, work, leisure, and social participation.  ?  ?COMORBIDITIES may have co-morbidities that affects occupational performance. Patient will benefit from skilled OT to address above impairments and improve overall function. ?   ?  ?PLAN: ?OT FREQUENCY: 1x/week; decreased frequency due to self-pay and/or potential insurance-based visit limit ?  ?OT DURATION: 10 weeks ?  ?PLANNED INTERVENTIONS: self care/ADL training, therapeutic exercise, therapeutic activity, neuromuscular re-education, manual therapy, passive range of motion, functional mobility training, aquatic therapy, splinting, electrical stimulation, ultrasound, moist heat, cryotherapy, patient/family education, cognitive remediation/compensation, visual/perceptual remediation/compensation, and DME and/or AE instructions ?  ?RECOMMENDED OTHER SERVICES: Currently receiving PT and ST at this location ?  ?CONSULTED AND AGREED WITH PLAN OF CARE: Patient ?  ?PLAN FOR NEXT SESSION: NMR of LUE (weight bearing, AROM in gravity min/assisted positions); assess visual perception (visual scanning, visual fields, finger-to-nose) ? ? ?Kathrine Cords, MSOT, OTR/L ?05/27/2021, 9:58 AM ?

## 2021-05-29 ENCOUNTER — Encounter: Payer: Self-pay | Admitting: Speech Pathology

## 2021-05-29 ENCOUNTER — Ambulatory Visit: Payer: Self-pay | Admitting: Speech Pathology

## 2021-05-29 DIAGNOSIS — R41841 Cognitive communication deficit: Secondary | ICD-10-CM

## 2021-05-29 LAB — BASIC METABOLIC PANEL
BUN/Creatinine Ratio: 12 (ref 9–20)
BUN: 22 mg/dL (ref 6–24)
CO2: 25 mmol/L (ref 20–29)
Calcium: 9.6 mg/dL (ref 8.7–10.2)
Chloride: 103 mmol/L (ref 96–106)
Creatinine, Ser: 1.8 mg/dL — ABNORMAL HIGH (ref 0.76–1.27)
Glucose: 83 mg/dL (ref 70–99)
Potassium: 4.4 mmol/L (ref 3.5–5.2)
Sodium: 142 mmol/L (ref 134–144)
eGFR: 44 mL/min/{1.73_m2} — ABNORMAL LOW (ref >59)

## 2021-05-29 LAB — ALDOSTERONE + RENIN ACTIVITY W/ RATIO
ALDOS/RENIN RATIO: 55.8 — ABNORMAL HIGH (ref 0.0–30.0)
ALDOSTERONE: 12.1 ng/dL (ref 0.0–30.0)
Renin: 0.217 ng/mL/hr (ref 0.167–5.380)

## 2021-05-29 NOTE — Therapy (Signed)
?OUTPATIENT SPEECH LANGUAGE PATHOLOGY TREATMENT NOTE ? ? ?Patient Name: Miguel Mccullough ?MRN: [id number] ?DOB:[birth year], 56 y.o., male ?Today's Date: 05/29/2021 ? ?PCP: Pcp, No ?REFERRING PROVIDER: Courtney Heys, MD ? ?END OF SESSION:  ? End of Session - 05/29/21 1536   ? ? Visit Number 2   ? Number of Visits 17   ? Date for SLP Re-Evaluation 07/23/21   ? SLP Start Time 1531   ? SLP Stop Time  1611   ? SLP Time Calculation (min) 40 min   ? Activity Tolerance Patient tolerated treatment well   ? ?  ?  ? ?  ? ? ?Past Medical History:  ?Diagnosis Date  ? COVID-19 2021  ? continues to have increase in suptum production.  ? Hypertension   ? ?History reviewed. No pertinent surgical history. ?Patient Active Problem List  ? Diagnosis Date Noted  ? AMS (altered mental status) 05/24/2021  ? Aphasia 05/24/2021  ? History of intracranial hemorrhage 05/24/2021  ? 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 Stage 3b chronic kidney disease ([place]) 05/20/2021  ? Hypokalemia 05/20/2021  ? Mild major depression ([place]) 05/20/2021  ? Acute renal failure ([place]) 05/15/2021  ? Elevated lipids 05/15/2021  ? Spasticity 05/15/2021  ? Depression 05/15/2021  ? Constipation 05/15/2021  ? ICH (intracerebral hemorrhage) ([place]) 04/13/2021  ? ? ?ONSET DATE: 04/17/2021 ? ?REFERRING DIAG: ICH (intracerebral hemorrhage) ([place]) ? ?THERAPY DIAG:  ?Cognitive communication deficit ? ?SUBJECTIVE: Pt reports he was back in the hospital and had another suspected TIA. ? ?PAIN:  ?Are you having pain? No ? ? ? ? ?OBJECTIVE:  ? ?TODAY'S TREATMENT: ?Assessed medication management this session. Pt made 3 errors. Adding an additional orange pill, an additional white pill, and misread 2 pills twice daily vs. 1 pill twice daily (treated as the same). Pt was able to identify the extra pills that were added. When correcting the 2 pills twice daily, he also made an additional error (but was able to find once SLP identified it). Pt was able to identify errors on medication pill sheets  given 2 and 3 pills independently. When SLP increased to sheet with 4, 5, and 6, pt was more adept to making errors. When using strategy to double check and cross off, pt reduced and identified more errors prior to completing task.  ? ?PATIENT EDUCATION: ?Education details: Cog-comm impairment ?Person educated: Patient ?Education method: Explanation ?Education comprehension: verbalized understanding and needs further education ?  ?  ?  ?  ?GOALS: ?Goals reviewed with patient? Yes ?  ?SHORT TERM GOALS: Target date: 06/20/2021 ?  ?Pt will complete medication management task by comprehending medication directions and verbalizing errors made with minA verbal cues.  ?Baseline: Requires maxA at home ?Goal status: INITIAL ?  ?2.  Pt will verbalize Goal, Plan, Do, Review for given therapeutic task with modA verbal/visual cues.  ?Baseline: No knowledge ?Goal status: INITIAL ?  ?3.  Pt will comprehend organizational aids and verbalize the purpose of each aid with minA.   ?Baseline: No knowledge ?Goal status: INITIAL ?  ?  ?LONG TERM GOALS: Target date: 07/18/2021 ?  ?Pt will complete medication management task by comprehending medication directions and verbalizing errors made independently.  ?Baseline:  ?Goal status: INITIAL ?  ?2.  Pt will verbalize Goal, Plan, Do, Review for given therapeutic task with minA verbal/visual cues.  ?Baseline:  ?Goal status: INITIAL ?  ?3.  Pt will demonstrate and/or report use of organizational aids at home/work.  ?Baseline:  ?Goal status: INITIAL ?  ?  ASSESSMENT: ?  ?CLINICAL IMPRESSION: ?See tx note. Pt reports he has not noticed any significant changes with the most previous TIA. SLP also did not note any overt changes. Pt in agreement with goals. Continue with current POC.  ?  ?  ?OBJECTIVE IMPAIRMENTS include attention, awareness, and executive functioning. These impairments are limiting patient from return to work, managing medications, managing appointments, managing finances, household  responsibilities, and effectively communicating at home and in community. ?Factors affecting potential to achieve goals and functional outcome are financial resources.. Patient will benefit from skilled SLP services to address above impairments and improve overall function. ?  ?REHAB POTENTIAL: Good ?  ?PLAN: ?SLP FREQUENCY: 2x/week ?  ?SLP DURATION: 8 weeks ?  ?PLANNED INTERVENTIONS: Language facilitation, Environmental controls, Cueing hierachy, Cognitive reorganization, Internal/external aids, Functional tasks, SLP instruction and feedback, Compensatory strategies, and Patient/family education ?  ? ? ?Verdene Lennert, [place] ?05/29/2021, 3:40 PM ?  ?

## 2021-05-31 ENCOUNTER — Telehealth: Payer: Self-pay | Admitting: Internal Medicine

## 2021-05-31 ENCOUNTER — Encounter: Payer: Self-pay | Admitting: Internal Medicine

## 2021-05-31 NOTE — Telephone Encounter (Signed)
Phone call placed to patient and his wife today using both phone numbers.  I got voicemail for both.  I left a message on his wife voicemail letting her know that I got the results back of the hormone levels that I had checked and I will send the results to his MyChart.   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  I would need to refer him to an endocrinologist for further evaluation and management.  Please let me know once he has been approved for Medicaid so that I can submit this referral. ?

## 2021-06-02 NOTE — Telephone Encounter (Signed)
Pt reviewed labs on 05/31/21 on MyChart ?

## 2021-06-03 ENCOUNTER — Ambulatory Visit: Payer: Self-pay | Admitting: Physical Therapy

## 2021-06-03 ENCOUNTER — Ambulatory Visit: Payer: Self-pay | Admitting: Speech Pathology

## 2021-06-03 ENCOUNTER — Ambulatory Visit: Payer: Self-pay | Admitting: Urology

## 2021-06-03 NOTE — Progress Notes (Deleted)
   Assessment: No diagnosis found.   Plan: ***  Chief Complaint: No chief complaint on file.   History of Present Illness:  Miguel Mccullough is a 56 y.o. year old male who is seen in consultation from Pcp, No  for evaluation of ***.   Past Medical History:  Past Medical History:  Diagnosis Date   COVID-19 2021   continues to have increase in suptum production.   Hypertension     Past Surgical History:  No past surgical history on file.  Allergies:  No Known Allergies  Family History:  Family History  Problem Relation Age of Onset   Hypertension Mother    Hypertension Father    Hypertension Sister    Hypertension Brother     Social History:  Social History   Tobacco Use   Smoking status: Never   Smokeless tobacco: Never  Vaping Use   Vaping Use: Never used  Substance Use Topics   Alcohol use: Never   Drug use: Never    Review of symptoms:  Constitutional:  Negative for unexplained weight loss, night sweats, fever, chills ENT:  Negative for nose bleeds, sinus pain, painful swallowing CV:  Negative for chest pain, shortness of breath, exercise intolerance, palpitations, loss of consciousness Resp:  Negative for cough, wheezing, shortness of breath GI:  Negative for nausea, vomiting, diarrhea, bloody stools GU:  Positives noted in HPI; otherwise negative for gross hematuria, dysuria, urinary incontinence Neuro:  Negative for seizures, poor balance, limb weakness, slurred speech Psych:  Negative for lack of energy, depression, anxiety Endocrine:  Negative for polydipsia, polyuria, symptoms of hypoglycemia (dizziness, hunger, sweating) Hematologic:  Negative for anemia, purpura, petechia, prolonged or excessive bleeding, use of anticoagulants  Allergic:  Negative for difficulty breathing or choking as a result of exposure to anything; no shellfish allergy; no allergic response (rash/itch) to materials, foods  Physical exam: There were no vitals taken for  this visit. GENERAL APPEARANCE:  Well appearing, well developed, well nourished, NAD HEENT: Atraumatic, Normocephalic, oropharynx clear. NECK: Supple without lymphadenopathy or thyromegaly. LUNGS: Clear to auscultation bilaterally. HEART: Regular Rate and Rhythm without murmurs, gallops, or rubs. ABDOMEN: Soft, non-tender, No Masses. EXTREMITIES: Moves all extremities well.  Without clubbing, cyanosis, or edema. NEUROLOGIC:  Alert and oriented x 3, normal gait, CN II-XII grossly intact.  MENTAL STATUS:  Appropriate. BACK:  Non-tender to palpation.  No CVAT SKIN:  Warm, dry and intact.    Results: No results found for this or any previous visit (from the past 24 hour(s)).

## 2021-06-05 ENCOUNTER — Encounter: Payer: Self-pay | Admitting: Occupational Therapy

## 2021-06-05 ENCOUNTER — Encounter: Payer: Self-pay | Admitting: Speech Pathology

## 2021-06-05 ENCOUNTER — Ambulatory Visit: Payer: Self-pay | Admitting: Occupational Therapy

## 2021-06-05 ENCOUNTER — Ambulatory Visit: Payer: Self-pay | Admitting: Speech Pathology

## 2021-06-05 DIAGNOSIS — I69354 Hemiplegia and hemiparesis following cerebral infarction affecting left non-dominant side: Secondary | ICD-10-CM

## 2021-06-05 DIAGNOSIS — R41841 Cognitive communication deficit: Secondary | ICD-10-CM

## 2021-06-05 DIAGNOSIS — R2689 Other abnormalities of gait and mobility: Secondary | ICD-10-CM

## 2021-06-05 DIAGNOSIS — R41840 Attention and concentration deficit: Secondary | ICD-10-CM

## 2021-06-05 DIAGNOSIS — R278 Other lack of coordination: Secondary | ICD-10-CM

## 2021-06-05 NOTE — Therapy (Signed)
?OUTPATIENT OCCUPATIONAL THERAPY TREATMENT NOTE ? ? ?Patient Name: Miguel Mccullough ?MRN: [id number] ?DOB:03/03/1965, 56 y.o., male ?Today's Date: 06/05/2021 ? ?PCP: Pcp, No ?REFERRING PROVIDER: Courtney Heys, MD ? ?END OF SESSION: ? ? OT End of Session - 06/05/21 1537   ? ? Visit Number 3   ? Date for OT Re-Evaluation 08/20/21   ? Authorization Type self pay; has applied for Medicaid   ? OT Start Time 1534   ? OT Stop Time 1616   ? OT Time Calculation (min) 42 min   ? Activity Tolerance Patient tolerated treatment well   ? Behavior During Therapy [facility] for tasks assessed/performed   ? ?  ?  ? ?  ? ?Past Medical History:  ?Diagnosis Date  ? COVID-19 2021  ? continues to have increase in suptum production.  ? Hypertension   ? ?History reviewed. No pertinent surgical history. ?Patient Active Problem List  ? Diagnosis Date Noted  ? AMS (altered mental status) 05/24/2021  ? Aphasia 05/24/2021  ? History of intracranial hemorrhage 05/24/2021  ? 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 Stage 3b chronic kidney disease ([place]) 05/20/2021  ? Hypokalemia 05/20/2021  ? Mild major depression ([place]) 05/20/2021  ? Acute renal failure ([place]) 05/15/2021  ? Elevated lipids 05/15/2021  ? Spasticity 05/15/2021  ? Depression 05/15/2021  ? Constipation 05/15/2021  ? ICH (intracerebral hemorrhage) ([place]) 04/13/2021  ? ? ?ONSET DATE: 04/13/21 ? ?REFERRING DIAG: R CVA ? ?THERAPY DIAG:  ?Hemiplegia and hemiparesis following cerebral infarction affecting left non-dominant side ([place]) ? ?Attention and concentration deficit ? ?Other abnormalities of gait and mobility ? ?Other lack of coordination ? ?Cognitive communication deficit ? ? 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 Transferred to [facility] 3/2-3/30/23. PMH includes stage III CKD, depression, and HTN ? ?PRECAUTIONS: Fall ? ?SUBJECTIVE:  ?"My friends got me a hand exercise kit" ? ?PAIN:   ?Are you having pain? No ? ? ?OBJECTIVE:  ? ?TODAY'S TREATMENT: ?06/05/21 ?Closed-chain AAROM of L shoulder flexion using UE ranger, towel slides, and chair w/ pt sitting in w/c. OT provided support under L forearm when using ranger w/ pt able to achieve poor flex/ext; increased consistency w/ ext compared to flex. Attempted towel slides on tabletop w/ elbow flexed to 90 w/out success; activity d/c. OT then attempted flex/ext against slight resistance for increased somatosensory input, positioning L hand on back of chair and supported LUE w/ pt attempting to push and pull chair forward and back; requiring constant support of L hand positioning and for increased control of chair, but was able to achieve fair activation of both shoulder flex/ext inconsistently ?Neuromuscular reeducation of L shoulder flexion using UE Ranger w/ NMES applied to anterior deltoid and biceps; no significant difference in arc of motion observed w/ NMES vs closed-chain AAROM above ?Parameters: ?VMS waveform ?10/10 cycle time ?35 pps frequency ?1-2 sec ramp time ?300 usec phase duration ?Amp/intensity of 24 mA CC  ?AROM of L wrist flex/ext attempted in both gravity-minimized (forearm in neutral on tabletop) and gravity-assisted positions (elbow flexed and resting on tabletop); able to achieve good wrist flexion in both positions w/ decreased arc of motion achieved w/ wrist extension. OT provided active-assist w/ wrist ext in gravity-assisted position each rep; ROM slightly limited by extensor tightness ?AROM of L forearm sup/pro completed against gravity and in gravity-minimized (forearm flexed and resting on tabletop) and gravity-assisted positions (forearm in neutral w/ OT supporting  LUE); most success in gravity-minimized position w/ pt able to achieve greater pronation vs supination. OT incorporated active-assist w/ supination for increased stretch and ROM each rep ? ?        PREVIOUS TREATMENTS ?05/26/21 ?Weight bearing ?RUE NMR in  supine ?UE Ranger ? ? ?PATIENT EDUCATION: ?Education details: Continued condition-specific education, including neuro reed and typical recovery patterns  ?Person educated: Patient ?Education method: Explanation ?Education comprehension: verbalized understanding ?  ? ?HOME EXERCISE PROGRAM: ?To be administered ?  ?  ?GOALS: ?Goals reviewed with patient? Yes ?  ?SHORT TERM GOALS: Target date: 06/27/2021 ?  ?STG   Baseline Goal Status  ?1 Pt will demonstrate independence w/ initial HEP designed for NMR and stretching of LUE N/A IN PROGRESS  ?2 Pt will be able to complete a task in standing for at least 5 min at [facility] to improve participation in IADL tasks Decreased standing tolerance/balance IN PROGRESS  ?3 Pt will be able to cut food w/ Mod I, using AE prn Unable to cut tough food IN PROGRESS  ?  ?LONG TERM GOALS: Target date: 08/01/2021 ?  ?LTG   Baseline Goal Status  ?1 Pt will be able to complete LB dressing at setup assist, incorporating compensatory strategies/AE prn Mod A IN PROGRESS  ?2 Pt will be able to complete meal prep activity w/ Mod I by d/c Able to complete simple snack prep IN PROGRESS  ?3 Pt will demonstrate completion of simulated medication management activity at [facility] for safety Min A IN PROGRESS  ?4 Pt will be able to safely complete simulated tub/shower transfer at [place]  ?5 FOTO goal -- to be assessed TBD IN PROGRESS  ?  ?ASSESSMENT: ?  ?CLINICAL IMPRESSION: ?Pt continues to make progress toward goals and demonstrates fair activation and GMC of L forearm and wrist movement. Wrist extension and forearm supination partly limited by increased tone/spasticity and will benefit from OT including PROM/passive stretching exercises into HEP; to be introduced next session. Shoulder ROM also remains limited and inconsistent w/ OT attempted to incorporate NMES into repetition of shoulder flexion for increased activation w/out notable improvement. ?  ?PERFORMANCE DEFICITS in functional skills  including ADLs, IADLs, coordination, dexterity, proprioception, sensation, edema, tone, ROM, strength, FMC, GMC, mobility, balance, body mechanics, decreased knowledge of precautions, and decreased knowledge of use of DME, cognitive skills including attention, memory, perception, problem solving, safety awareness, and sequencing, and psychosocial skills including environmental adaptation.  ?  ?IMPAIRMENTS are limiting patient from ADLs, IADLs, rest and sleep, work, leisure, and social participation.  ?  ?COMORBIDITIES may have co-morbidities that affects occupational performance. Patient will benefit from skilled OT to address above impairments and improve overall function. ?   ?  ?PLAN: ?OT FREQUENCY: 1x/week; decreased frequency due to self-pay and/or potential insurance-based visit limit ?  ?OT DURATION: 10 weeks ?  ?PLANNED INTERVENTIONS: self care/ADL training, therapeutic exercise, therapeutic activity, neuromuscular re-education, manual therapy, passive range of motion, functional mobility training, aquatic therapy, splinting, electrical stimulation, ultrasound, moist heat, cryotherapy, patient/family education, cognitive remediation/compensation, visual/perceptual remediation/compensation, and DME and/or AE instructions ?  ?RECOMMENDED OTHER SERVICES: Currently receiving PT and ST at this location ?  ?CONSULTED AND AGREED WITH PLAN OF CARE: Patient ?  ?PLAN FOR NEXT SESSION: NMR of LUE (weight bearing, AROM in gravity min/assisted positions); assess visual perception (visual scanning, visual fields, finger-to-nose); administer HEP exercises; practice LB dressing (donning socks) ? ? ?Kathrine Cords, MSOT, OTR/L ?06/05/2021, 3:38 PM ?

## 2021-06-05 NOTE — Therapy (Signed)
?OUTPATIENT SPEECH LANGUAGE PATHOLOGY TREATMENT NOTE ? ? ?Patient Name: Miguel Mccullough ?MRN: [id number] ?DOB:[birth year], 56 y.o., male ?Today's Date: 06/05/2021 ? ?PCP: Pcp, No ?REFERRING PROVIDER: Courtney Heys, MD ? ?END OF SESSION:  ? End of Session - 06/05/21 1619   ? ? Visit Number 3   ? Number of Visits 17   ? Date for SLP Re-Evaluation 07/23/21   ? SLP Start Time 4742   ? SLP Stop Time  1655   ? SLP Time Calculation (min) 38 min   ? Activity Tolerance Patient tolerated treatment well   ? ?  ?  ? ?  ? ? ?Past Medical History:  ?Diagnosis Date  ? COVID-19 2021  ? continues to have increase in suptum production.  ? Hypertension   ? ?History reviewed. No pertinent surgical history. ?Patient Active Problem List  ? Diagnosis Date Noted  ? AMS (altered mental status) 05/24/2021  ? Aphasia 05/24/2021  ? History of intracranial hemorrhage 05/24/2021  ? 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 Stage 3b chronic kidney disease ([place]) 05/20/2021  ? Hypokalemia 05/20/2021  ? Mild major depression ([place]) 05/20/2021  ? Acute renal failure ([place]) 05/15/2021  ? Elevated lipids 05/15/2021  ? Spasticity 05/15/2021  ? Depression 05/15/2021  ? Constipation 05/15/2021  ? ICH (intracerebral hemorrhage) ([place]) 04/13/2021  ? ? ?ONSET DATE: 04/17/2021 ? ?REFERRING DIAG: ICH (intracerebral hemorrhage) ([place]) ? ?THERAPY DIAG:  ?Cognitive communication deficit ? ?SUBJECTIVE: Pt reports feeling good ? ?PAIN:  ?Are you having pain? No ? ? ? ? ?OBJECTIVE:  ? ?TODAY'S TREATMENT: ?Pt reported he was more careful at home when organizing and went through to double check for errors. Completed medication error ID task this date with (8 pills). Pt made error x1. Edu on organizational strategies this session. Pt was able to comprehend aids and verbalize purpose given minA. Assisted in providing examples to patient. Pt reports he could benefit from creating a routine at home to assist with motivation. To cont strategies next session.  ? ? ?PATIENT  EDUCATION: ?Education details: Cog-comm impairment ?Person educated: Patient ?Education method: Explanation ?Education comprehension: verbalized understanding and needs further education ?  ?  ?  ?  ?GOALS: ?Goals reviewed with patient? Yes ?  ?SHORT TERM GOALS: Target date: 06/20/2021 ?  ?Pt will complete medication management task by comprehending medication directions and verbalizing errors made with minA verbal cues.  ?Baseline: Requires maxA at home ?Goal status: INITIAL ?  ?2.  Pt will verbalize Goal, Plan, Do, Review for given therapeutic task with modA verbal/visual cues.  ?Baseline: No knowledge ?Goal status: INITIAL ?  ?3.  Pt will comprehend organizational aids and verbalize the purpose of each aid with minA.   ?Baseline: No knowledge ?Goal status: INITIAL ?  ?  ?LONG TERM GOALS: Target date: 07/18/2021 ?  ?Pt will complete medication management task by comprehending medication directions and verbalizing errors made independently.  ?Baseline:  ?Goal status: INITIAL ?  ?2.  Pt will verbalize Goal, Plan, Do, Review for given therapeutic task with minA verbal/visual cues.  ?Baseline:  ?Goal status: INITIAL ?  ?3.  Pt will demonstrate and/or report use of organizational aids at home/work.  ?Baseline:  ?Goal status: INITIAL ?  ?ASSESSMENT: ?  ?CLINICAL IMPRESSION: ?See tx note. Pt in agreement with goals. Continue with current POC.  ?  ?  ?OBJECTIVE IMPAIRMENTS include attention, awareness, and executive functioning. These impairments are limiting patient from return to work, managing medications, managing appointments, managing finances, household responsibilities, and effectively communicating  at home and in community. ?Factors affecting potential to achieve goals and functional outcome are financial resources.. Patient will benefit from skilled SLP services to address above impairments and improve overall function. ?  ?REHAB POTENTIAL: Good ?  ?PLAN: ?SLP FREQUENCY: 2x/week ?  ?SLP DURATION: 8 weeks ?  ?PLANNED  INTERVENTIONS: Language facilitation, Environmental controls, Cueing hierachy, Cognitive reorganization, Internal/external aids, Functional tasks, SLP instruction and feedback, Compensatory strategies, and Patient/family education ?  ? ? ?Verdene Lennert, [place] ?06/05/2021, 4:23 PM ?  ?

## 2021-06-09 ENCOUNTER — Other Ambulatory Visit: Payer: Self-pay | Admitting: Physical Medicine and Rehabilitation

## 2021-06-09 ENCOUNTER — Encounter: Payer: Self-pay | Admitting: Physical Therapy

## 2021-06-09 ENCOUNTER — Other Ambulatory Visit: Payer: Self-pay

## 2021-06-09 ENCOUNTER — Ambulatory Visit: Payer: Self-pay | Admitting: Physical Therapy

## 2021-06-09 DIAGNOSIS — R262 Difficulty in walking, not elsewhere classified: Secondary | ICD-10-CM

## 2021-06-09 DIAGNOSIS — R2681 Unsteadiness on feet: Secondary | ICD-10-CM

## 2021-06-09 DIAGNOSIS — R278 Other lack of coordination: Secondary | ICD-10-CM

## 2021-06-09 NOTE — Therapy (Signed)
[facility] ?[place] ?[address] ?[place], [place], [address] ?Phone: [phone]   Fax:  [phone] ? ?Physical Therapy Treatment ? ?Patient Details  ?Name: Miguel Mccullough ?MRN: [id number] ?Date of Birth: 12-14-1965 ?Referring Provider (PT): Megan Lovorn ? ? ?Encounter Date: 06/09/2021 ? ? PT End of Session - 06/09/21 1621   ? ? Visit Number 3   ? Number of Visits 10   ? Date for PT Re-Evaluation 07/28/21   ? Authorization Type Self pay   ? Authorization Time Period 05/19/21 to 07/28/21   ? PT Start Time 1533   ? PT Stop Time 3825   ? PT Time Calculation (min) 41 min   ? Equipment Utilized During Treatment Gait belt   ? Activity Tolerance Patient tolerated treatment well   ? Behavior During Therapy [facility] for tasks assessed/performed   ? ?  ?  ? ?  ? ? ?Past Medical History:  ?Diagnosis Date  ? COVID-19 2021  ? continues to have increase in suptum production.  ? Hypertension   ? ? ?History reviewed. No pertinent surgical history. ? ?There were no vitals filed for this visit. ? ? Subjective Assessment - 06/09/21 1536   ? ? Subjective Doing good, I feel like I can move a little more more like I can move my toes. I've been going up and down steps with my wife and walk in the house with my wife too. I do butt scoot down the steps to be safe.   ? Patient Stated Goals be able to walk again, get left leg moving   ? Currently in Pain? No/denies   ? ?  ?  ? ?  ? ? ? ? ? ? ? ? ? ? ? ? ? ? ? ? ? ? ? ? [facility] Adult PT Treatment/Exercise - 06/09/21 0001   ? ?  ? Knee/Hip Exercises: Aerobic  ? Nustep Nustep L4 BLEs only, velcro strap around BLEs to keep L LE in a good position   ?  ? Knee/Hip Exercises: Standing  ? Gait Training gait x30ft hemiwalker min guard, then x44ft with SBQC and min guard   increased toe dragging/L LE ER and L knee hyperextension noted with SBQC  ?  ? Knee/Hip Exercises: Seated  ? Long [employer] Left;2 sets;10 reps   ? Other Seated Knee/Hip Exercises ankle DF 2x20 L LE    ? Other Seated Knee/Hip Exercises cross midline reaches with progressive distance for increased WB L LE and to help improve L inattention   ? Marching Left;2 sets;10 reps   ? Sit to Sand 2 sets;5 reps;with UE support   min-modA for trunk control and good technique  ? ?  ?  ? ?  ? ? ? ? ? ? ? ? ? ? PT Education - 06/09/21 1620   ? ? Education Details POC moving forward, HEP, reminders to find an object he knows is upright and use that as a visual marker to make sure he is not leaning   ? Person(s) Educated Patient   ? Methods Explanation   ? Comprehension Verbalized understanding   ? ?  ?  ? ?  ? ? ? PT Short Term Goals - 05/26/21 1819   ? ?  ? PT SHORT TERM GOAL #1  ? Title Will be able to perform bed mobility with no more than S level assist   ? Baseline CGA   ? Time 4   ? Period  Weeks   ? Status On-going   ? Target Date 06/23/21   ?  ? PT SHORT TERM GOAL #2  ? Title Will be able to perform functional stand and squat pivot transfers with no more than S level assist   ? Baseline CGA   ? Time 4   ? Period Weeks   ? Status On-going   ?  ? PT SHORT TERM GOAL #3  ? Title Will be able to maintain static standing for at least 5 minutes in complete midline with equal WB each LE   ? Time 4   ? Period Weeks   ? Status On-going   ?  ? PT SHORT TERM GOAL #4  ? Title Will appropriately attend to and recognize visual stimulation on L side on 4/5 attempts to show improving L inattention   ? Time 4   ? Period Weeks   ? Status On-going   ?  ? PT SHORT TERM GOAL #5  ? Title Will be able to gait train at least 2ft with LRAD and no more than min guard assist   ? Time 4   ? Period Weeks   ? Status On-going   ? ?  ?  ? ?  ? ? ? ? PT Long Term Goals - 05/19/21 1736   ? ?  ? PT LONG TERM GOAL #1  ? Title MMT to improve by 1 grade in all weak groups in BLEs   ? Time 10   ? Period Weeks   ? Status New   ? Target Date 07/28/21   ?  ? PT LONG TERM GOAL #2  ? Title Will be able to gait train at least 133ft with LRAD and no more than distant  S assist   ? Time 10   ? Period Weeks   ? Status New   ?  ? PT LONG TERM GOAL #3  ? Title Will be able to navigate at least 4 steps with U rail and no more than Min guard assistance   ? Time 10   ? Period Weeks   ? Status New   ?  ? PT LONG TERM GOAL #4  ? Title Will be able to perform all bed mobility and functional transfers on a Mod(I) level with LRAD   ? Time 10   ? Period Weeks   ? Status New   ?  ? PT LONG TERM GOAL #5  ? Title Will be able to complete TUG in 25 seconds or less with LRAD to show improved functional balance   ? Time 10   ? Period Weeks   ? Status New   ? ?  ?  ? ?  ? ? ? ? ? ? ? ? Plan - 06/09/21 1621   ? ? Clinical Impression Statement Miguel Mccullough arrives today doing much better- he is getting more mm activation in L LE and really seems to have improved his L inattention. Worked on a lot of weight shifting and weight bearing based activities for L LE today. Did need up to Mod A for effective wt shift over LLE with more dynamic tasks, also reminders to correct lean as well when distracted but this was also much better. Gait pattern much better today too, able to bring L LE through for swing without assist but does tend to drag toe and externally rotate leg still.  Really making good progress!   ? Personal Factors and Comorbidities Finances;Comorbidity 2;Age   ?  Examination-Activity Limitations Locomotion Level;Transfers;Bed Mobility;Sit;Squat;Stairs;Stand   ? Examination-Participation Restrictions Community Activity;Driving;Shop;Yard Work   ? Stability/Clinical Decision Making Evolving/Moderate complexity   ? Clinical Decision Making Moderate   ? Rehab Potential Good   ? PT Frequency 1x / week   ? PT Duration Other (comment)   ? PT Treatment/Interventions ADLs/Self Care Home Management;Biofeedback;Cryotherapy;Electrical Stimulation;DME Instruction;Gait training;Stair training;Functional mobility training;Therapeutic activities;Therapeutic exercise;Balance training;Neuromuscular re-education;Cognitive  remediation;Patient/family education;Wheelchair mobility training;Manual techniques;Energy conservation;Taping;Visual/perceptual remediation/compensation   ? PT Next Visit Plan work on standing weight shifting over L LE/standing tasks, stair training, gait training   ? PT Home Exercise Plan sitting/stanidng in midline at home with family/RW using mirror, L LE anlke DF, LAQs, hip flexion against gravity   ? Consulted and Agree with Plan of Care Patient   ? ?  ?  ? ?  ? ? ?Patient will benefit from skilled therapeutic intervention in order to improve the following deficits and impairments:  Abnormal gait, Decreased coordination, Difficulty walking, Increased fascial restricitons, Cardiopulmonary status limiting activity, Impaired UE functional use, Decreased activity tolerance, Impaired vision/preception, Decreased balance, Decreased knowledge of use of DME, Decreased cognition, Decreased mobility, Decreased strength, Impaired sensation, Postural dysfunction ? ?Visit Diagnosis: ?Difficulty in walking, not elsewhere classified ? ?Unsteadiness on feet ? ?Other lack of coordination ? ? ? ? ?Problem List ?Patient Active Problem List  ? Diagnosis Date Noted  ? AMS (altered mental status) 05/24/2021  ? Aphasia 05/24/2021  ? History of intracranial hemorrhage 05/24/2021  ? 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 Stage 3b chronic kidney disease ([place]) 05/20/2021  ? Hypokalemia 05/20/2021  ? Mild major depression ([place]) 05/20/2021  ? Acute renal failure ([place]) 05/15/2021  ? Elevated lipids 05/15/2021  ? Spasticity 05/15/2021  ? Depression 05/15/2021  ? Constipation 05/15/2021  ? ICH (intracerebral hemorrhage) ([place]) 04/13/2021  ? ?Delisha Peaden U PT, DPT, PN2  ? ?Supplemental Physical Therapist ?[facility]  ? ? ? ? ? ?[facility] ?[address] ?[address] ?[place], [place], [address] ?Phone: [phone]   Fax:  [phone] ? ?Name: Miguel Mccullough ?MRN: [id number] ?Date of Birth: 04-20-1965 ? ? ? ?

## 2021-06-10 ENCOUNTER — Other Ambulatory Visit: Payer: Self-pay

## 2021-06-10 MED ORDER — MAGNESIUM OXIDE 400 MG PO TABS
400.0000 mg | ORAL_TABLET | Freq: Every day | ORAL | 3 refills | Status: DC
  Filled 2021-06-10: qty 30, 30d supply, fill #0
  Filled 2021-07-09: qty 30, 30d supply, fill #1
  Filled 2021-08-07: qty 30, 30d supply, fill #2
  Filled 2021-08-31: qty 30, 30d supply, fill #3

## 2021-06-10 MED ORDER — AMANTADINE HCL 100 MG PO CAPS
100.0000 mg | ORAL_CAPSULE | Freq: Every day | ORAL | 2 refills | Status: DC
  Filled 2021-06-10: qty 30, 30d supply, fill #0
  Filled 2021-07-09: qty 30, 30d supply, fill #1
  Filled 2021-08-07: qty 30, 30d supply, fill #2

## 2021-06-11 ENCOUNTER — Other Ambulatory Visit: Payer: Self-pay

## 2021-06-12 ENCOUNTER — Encounter: Payer: Self-pay | Admitting: Speech Pathology

## 2021-06-12 ENCOUNTER — Ambulatory Visit: Payer: Self-pay | Admitting: Occupational Therapy

## 2021-06-12 ENCOUNTER — Encounter: Payer: Self-pay | Admitting: Occupational Therapy

## 2021-06-12 ENCOUNTER — Ambulatory Visit: Payer: Self-pay | Admitting: Speech Pathology

## 2021-06-12 DIAGNOSIS — R41840 Attention and concentration deficit: Secondary | ICD-10-CM

## 2021-06-12 DIAGNOSIS — R2689 Other abnormalities of gait and mobility: Secondary | ICD-10-CM

## 2021-06-12 DIAGNOSIS — R278 Other lack of coordination: Secondary | ICD-10-CM

## 2021-06-12 DIAGNOSIS — I69354 Hemiplegia and hemiparesis following cerebral infarction affecting left non-dominant side: Secondary | ICD-10-CM

## 2021-06-12 DIAGNOSIS — R41841 Cognitive communication deficit: Secondary | ICD-10-CM

## 2021-06-12 NOTE — Therapy (Signed)
?OUTPATIENT SPEECH LANGUAGE PATHOLOGY TREATMENT NOTE ? ? ?Patient Name: Miguel Mccullough ?MRN: [id number] ?DOB:May 07, 1965, 56 y.o., male ?Today's Date: 06/12/2021 ? ?PCP: Pcp, No ?REFERRING PROVIDER: Courtney Heys, MD ? ?END OF SESSION:  ? End of Session - 06/12/21 1539   ? ? Visit Number 4   ? Number of Visits 17   ? Date for SLP Re-Evaluation 07/23/21   ? SLP Start Time 8527   ? SLP Stop Time  1613   ? SLP Time Calculation (min) 38 min   ? Activity Tolerance Patient tolerated treatment well   ? ?  ?  ? ?  ? ? ?Past Medical History:  ?Diagnosis Date  ? COVID-19 2021  ? continues to have increase in suptum production.  ? Hypertension   ? ?History reviewed. No pertinent surgical history. ?Patient Active Problem List  ? Diagnosis Date Noted  ? AMS (altered mental status) 05/24/2021  ? Aphasia 05/24/2021  ? History of intracranial hemorrhage 05/24/2021  ? 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 Stage 3b chronic kidney disease ([place]) 05/20/2021  ? Hypokalemia 05/20/2021  ? Mild major depression ([place]) 05/20/2021  ? Acute renal failure ([place]) 05/15/2021  ? Elevated lipids 05/15/2021  ? Spasticity 05/15/2021  ? Depression 05/15/2021  ? Constipation 05/15/2021  ? ICH (intracerebral hemorrhage) ([place]) 04/13/2021  ? ? ?ONSET DATE: 04/17/2021 ? ?REFERRING DIAG: ICH (intracerebral hemorrhage) ([place]) ? ?THERAPY DIAG:  ?Cognitive communication deficit ? ?SUBJECTIVE: Pt reports feeling good ? ?PAIN:  ?Are you having pain? No ? ? ? ?OBJECTIVE:  ? ?TODAY'S TREATMENT: ?Pt reported that he has begun a routine to assist with motivation to complete tasks. Pt reports he feels like this helps with time management at home. Also reports using to-lists and checking items off the list. Began training on attention strategies this session to assist with increased comprehension of organizational aids. Pt reports fatigue and difficulty with time maintenance. Pt also reported dysarthric speech changes when tired. SLP edu on "Be Clear" strategy to pt and provided  with handout. Will continue to monitor.  ? ? ? ?PATIENT EDUCATION: ?Education details: Cog-comm impairment ?Person educated: Patient ?Education method: Explanation ?Education comprehension: verbalized understanding and needs further education ?  ?  ?  ?  ?GOALS: ?Goals reviewed with patient? Yes ?  ?SHORT TERM GOALS: Target date: 06/20/2021 ?  ?Pt will complete medication management task by comprehending medication directions and verbalizing errors made with minA verbal cues.  ?Baseline: Requires maxA at home ?Goal status: INITIAL ?  ?2.  Pt will verbalize Goal, Plan, Do, Review for given therapeutic task with modA verbal/visual cues.  ?Baseline: No knowledge ?Goal status: INITIAL ?  ?3.  Pt will comprehend organizational aids and verbalize the purpose of each aid with minA.   ?Baseline: No knowledge ?Goal status: INITIAL ?  ?  ?LONG TERM GOALS: Target date: 07/18/2021 ?  ?Pt will complete medication management task by comprehending medication directions and verbalizing errors made independently.  ?Baseline:  ?Goal status: INITIAL ?  ?2.  Pt will verbalize Goal, Plan, Do, Review for given therapeutic task with minA verbal/visual cues.  ?Baseline:  ?Goal status: INITIAL ?  ?3.  Pt will demonstrate and/or report use of organizational aids at home/work.  ?Baseline:  ?Goal status: INITIAL ?  ?ASSESSMENT: ?  ?CLINICAL IMPRESSION: ?See tx note. Pt in agreement with goals. Continue with current POC.  ?  ?  ?OBJECTIVE IMPAIRMENTS include attention, awareness, and executive functioning. These impairments are limiting patient from return to work, managing medications,  managing appointments, managing finances, household responsibilities, and effectively communicating at home and in community. ?Factors affecting potential to achieve goals and functional outcome are financial resources.. Patient will benefit from skilled SLP services to address above impairments and improve overall function. ?  ?REHAB POTENTIAL: Good ?  ?PLAN: ?SLP  FREQUENCY: 2x/week ?  ?SLP DURATION: 8 weeks ?  ?PLANNED INTERVENTIONS: Language facilitation, Environmental controls, Cueing hierachy, Cognitive reorganization, Internal/external aids, Functional tasks, SLP instruction and feedback, Compensatory strategies, and Patient/family education ?  ? ? ?Verdene Lennert, [place] ?06/12/2021, 3:40 PM ?  ?

## 2021-06-13 ENCOUNTER — Other Ambulatory Visit: Payer: Self-pay

## 2021-06-13 ENCOUNTER — Encounter: Payer: Self-pay | Admitting: Physical Medicine and Rehabilitation

## 2021-06-13 ENCOUNTER — Encounter: Payer: Self-pay | Attending: Physical Medicine and Rehabilitation | Admitting: Physical Medicine and Rehabilitation

## 2021-06-13 VITALS — BP 154/82 | HR 67 | Ht 64.0 in | Wt 170.0 lb

## 2021-06-13 DIAGNOSIS — I610 Nontraumatic intracerebral hemorrhage in hemisphere, subcortical: Secondary | ICD-10-CM | POA: Insufficient documentation

## 2021-06-13 DIAGNOSIS — R414 Neurologic neglect syndrome: Secondary | ICD-10-CM | POA: Insufficient documentation

## 2021-06-13 DIAGNOSIS — R252 Cramp and spasm: Secondary | ICD-10-CM | POA: Insufficient documentation

## 2021-06-13 DIAGNOSIS — R269 Unspecified abnormalities of gait and mobility: Secondary | ICD-10-CM | POA: Insufficient documentation

## 2021-06-13 NOTE — Patient Instructions (Signed)
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  ? ?Try to get Vit B complex -try to get in gummies- to combat irritability of Keppra ? ?2. Con't Baclofen 10 mg TID for spasticity- spasticity stable, to better-  ? ?3. Sees Neurology next week- reminder- ASK NEURO about Keppra and length of use.  ? ?4. Spasticity CAN get worse for 1-2 years- but since his has plateau'd/gotten better, I wants to wait on Botulinum toxin- CALL ME if it gets worse and would likely add Dantrolene or Zanaflex more likely until could get Botox- for LUE- but don't wait if it gets worse.  ? ?5. If gets sick, spasticity gets worse temporarily, so monitor this- can actually show up before symptoms of illness.  ? ? ?6. Still taking Amantadine- cannot increase due ot renal function- Cr 1.68  ? ? ?7. Stopped Ritalin- doing OK off it- due to elevated BP issues. I agree- off it- can think about again after Endo deals with BP issues/Renin elevation and Aldosterone elevation.  ? ? ?8. F/U in 3 months- call me if spasticity gets worse.  ?

## 2021-06-13 NOTE — Progress Notes (Signed)
? ?Subjective:  ? ? Patient ID: Miguel Mccullough, male    DOB: 11-23-1965, 56 y.o.   MRN: [id number] ? 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  ? ?Things going well.  ?Saw new PCP- went well-  ?Did get blood and  ?Renin/Aldosterone was "off the chart" and is likely the cause of HTN- being sent to Endo.  ?Hasn't made that appt yet-  ?Went back to hospital 4/8- just overnight- for "ministroke/seizure"- put on Seizure meds- Keppra- no irritability but has been a little more grumpy per wife.  ? ?Spasticity-  ?Spasms every now and then-  ?Muscle tightness- arm is about the same- and leg is better.  ? ?On Baclofen 10 mg TID-  ? ?BP- staying in 211H systolic- compliant with meds-  ? ?Getting therapy outpatient- just got into clinic- did [facility] outpatient- have heard from [place]- and started this last 2 eeks- goes 1x/week- also to join [employer] on Fridays as well.  ? ?Not much recovery in L arm.  ?LLE going well ? ?Walking with quad cane- only uses w/c outside the house unless really tired. Can walk into grocery store but not around inside of store.  ? ?L shoulder still bothersome- aches sometimes.  ?Only sometimes- ~1-2x/week if lays on it "weird way"-  ?Tylenol helps.  ? ?Pain Inventory ?Average Pain 3 ?Pain Right Now 0 ?My pain is intermittent and burning ? ?LOCATION OF PAIN  Shoulder ? ?BOWEL ?Number of stools per week: 3-4 ?Oral laxative use Yes  ?Type of laxative senokot ? ? ?BLADDER ?Normal ? ?Mobility ?walk with assistance ?use a cane ?use a walker ?how many minutes can you walk? 15 ?ability to climb steps?  yes ?do you drive?  yes ?Do you have any goals in this area?  no ? ?Function ?not employed: date last employed not working ?I need assistance with the following:  dressing, bathing, toileting, meal prep, household duties, and shopping ?Do you have any goals in this area?   yes ? ?Neuro/Psych ?weakness ?trouble walking ?spasms ? ?Prior Studies ?Any changes since last visit?  yes, Hospital visit in April ? ?Physicians involved in your care ?Any changes since last visit?  no ? ? ?Family History  ?Problem Relation Age of Onset  ? Hypertension Mother   ? Hypertension Father   ? Hypertension Sister   ? Hypertension Brother   ? ?Social History  ? ?Socioeconomic History  ? Marital status: Married  ?  Spouse name: Not on file  ? Number of children: Not on file  ? Years of education: Not on file  ? Highest education level: Not on file  ?Occupational History  ? Not on file  ?Tobacco Use  ? Smoking status: Never  ? Smokeless tobacco: Never  ?Vaping Use  ? Vaping Use: Never used  ?Substance and Sexual Activity  ? Alcohol use: Not Currently  ? Drug use: Never  ? Sexual activity: Not Currently  ?Other Topics Concern  ? Not on file  ?Social History Narrative  ? Not on file  ? ?Social Determinants of Health  ? ?Financial Resource Strain: Not on file  ?Food Insecurity: Not on file  ?Transportation Needs: Not on file  ?Physical Activity: Not on file  ?Stress: Not on file  ?Social Connections: Not on file  ? ?History reviewed. No pertinent surgical history. ?Past Medical History:  ?Diagnosis Date  ?  COVID-19 2021  ? continues to have increase in suptum production.  ? Hypertension   ? ?BP (!) 154/82   Pulse 67   Ht 5\' 4"  (1.626 m)   Wt 170 lb (77.1 kg)   SpO2 97%   BMI 29.18 kg/m?  ? ?Opioid Risk Score:   ?Fall Risk Score:  `1 ? ?Depression screen PHQ 2/9 ? ? ?  05/20/2021  ?  2:38 PM  ?Depression screen PHQ 2/9  ?Decreased Interest 1  ?Down, Depressed, Hopeless 1  ?PHQ - 2 Score 2  ?Altered sleeping 3  ?Tired, decreased energy 2  ?Change in appetite 0  ?Feeling bad or failure about yourself  1  ?Trouble concentrating 2  ?Moving slowly or fidgety/restless 0  ?Suicidal thoughts 0  ?PHQ-9 Score 10  ? ? ?Review of Systems  ?Musculoskeletal:  Positive for gait problem.  ?Neurological:  Positive for  weakness.  ?All other systems reviewed and are negative. ? ?   ?Objective:  ? Physical Exam ? ?Awake, alert, appropriate, in w/c- accompanied by wife- has Quad cane, quiet, NAD ?MS: ?RUE 5/5 ?LUE- biceps 1/5 triceps 2-/5; WE 2-/5; grip 2-/5 and FA 1/5 ?RLE- 5/5 ?LLE- HF 3/5; KE 2+/5; DF 2+/5; PF 2/5 ? ?Subluxation better on L shoulder ? ?Neuro: ?Few beats clonus LLE ?Hoffman's very brisk LUE ?MAS of 2 in L elbow, wrist; 2-3 in L shoulder- except 3 in L external rotation of shoulder; MAS of 2 in fingers ?MAS of 1+ in L hip and knee and ankle.  ? ? ?   ?Assessment & Plan:  ? 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  ? ?Try to get Vit B complex -try to get in gummies- to combat irritability of Keppra ? ?2. Con't Baclofen 10 mg TID for spasticity- spasticity stable, to better-  ? ?3. Sees Neurology next week- reminder- ASK NEURO about Keppra and length of use.  ? ?4. Spasticity CAN get worse for 1-2 years- but since his has plateau'd/gotten better, I wants to wait on Botulinum toxin- CALL ME if it gets worse and would likely add Dantrolene or Zanaflex more likely until could get Botox- for LUE- but don't wait if it gets worse.  ? ?5. If gets sick, spasticity gets worse temporarily, so monitor this- can actually show up before symptoms of illness.  ? ? ?6. Still taking Amantadine- cannot increase due ot renal function- Cr 1.68  ? ? ?7. Stopped Ritalin- doing OK off it- due to elevated BP issues. I agree- off it- can think about again after Endo deals with BP issues/Renin elevation and Aldosterone elevation.  ? ? ?8. F/U in 3 months- call me if spasticity gets worse.  ? ? ?I spent a total of  31  minutes on total care today- >50% coordination of care- due to education on spasticity, attention/neglect and Renin elevation and possible need for surgical intervention.  ? ? ?

## 2021-06-15 NOTE — Therapy (Signed)
?OUTPATIENT OCCUPATIONAL THERAPY TREATMENT NOTE ? ? ?Patient Name: Miguel Mccullough ?MRN: [id number] ?DOB:15-May-1965, 56 y.o., male ?Today's Date: 06/15/2021 ? ?PCP: Pcp, No ?REFERRING PROVIDER: Courtney Heys, MD ? ?END OF SESSION: ? ?OT End of Session - 06/12/21 1626  ?    ? Visit Number  4  ? Date for OT Re-Evaluation 08/20/2021  ? Authorization Type  Self pay; has applied for Medicaid  ? OT Start Time 1619  ? OT Stop Time 1700  ? OT Time Calculation (min) 41 min  ? Activity Tolerance Patient tolerated treatment well  ? Behavior During Therapy [facility] for tasks assessed/performed  ? ? ?Past Medical History:  ?Diagnosis Date  ? COVID-19 2021  ? continues to have increase in suptum production.  ? Hypertension   ? ?History reviewed. No pertinent surgical history. ?Patient Active Problem List  ? Diagnosis Date Noted  ? Neglect of one side of body 06/13/2021  ? Abnormality of gait 06/13/2021  ? AMS (altered mental status) 05/24/2021  ? Aphasia 05/24/2021  ? History of intracranial hemorrhage 05/24/2021  ? 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 Stage 3b chronic kidney disease ([place]) 05/20/2021  ? Hypokalemia 05/20/2021  ? Mild major depression ([place]) 05/20/2021  ? Acute renal failure ([place]) 05/15/2021  ? Elevated lipids 05/15/2021  ? Spasticity 05/15/2021  ? Depression 05/15/2021  ? Constipation 05/15/2021  ? ICH (intracerebral hemorrhage) ([place]) 04/13/2021  ? ? ?ONSET DATE: 04/13/21 ? ?REFERRING DIAG: R CVA ? ?THERAPY DIAG:  ?Hemiplegia and hemiparesis following cerebral infarction affecting left non-dominant side ([place]) ? ?Attention and concentration deficit ? ?Other lack of coordination ? ?Other abnormalities of gait and mobility ? ? 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 Transferred to [facility] 3/2-3/30/23. PMH includes stage III CKD, depression, and HTN ? ?PRECAUTIONS: Fall ? ?SUBJECTIVE:  ? ?SUBJECTIVE STATEMENT: ?Pt  reports he does not want to work on donning socks this session ? ?PAIN: ?Are you having pain? No ? ? ?OBJECTIVE:  ? ?TODAY'S TREATMENT: ?06/12/21 ?Weight shifting to L side while sitting EOM and weight bearing through LUE w/ disengaging of contralateral RUE for cross-body reaching to retrieve therapy cones from low level and place into stack position at high level; completed 2x10 w/ OT providing support of LUE/hand placement on EOM prn and up/down grading as appropriate  ?Closed-chain AAROM of L shoulder flexion using UE ranger; OT provided support under L elbow prn w/ pt able to achieve fair flex/ext and increased success w/ gravity assist positioning ?Shoulder shrugs and scapular retraction completed in front of mirror as visual aid w/ OT providing verbal and tactile cues for equal activation of scapular elevation and additional cues and grading to facilitate success w/ scapular retraction prn; slightly increased success w/ LUE-only scapular retraction vs. bilateral ?Towel slides for L shoulder flexion and external rotation on tabletop w/ elbow flexed to 90 degrees w/ fair success; ext rot > flexion. OT provided active-assist at end-range of external rotation within tolerable level of stretch ? ?        PREVIOUS TREATMENTS ?06/05/21 ?AAROM of L shoulder flexion using UE ranger, towel slides, and chair ?NMR w/ UE Ranger and NMES applied to anterior deltoid and biceps ?Parameters: VMS waveform; 10/10 cycle time; 35 pps frequency; 1-2 sec ramp time; 300 usec phase duration; amp/intensity of 24 mA CC  ?AROM of L wrist flex/ext ?AROM of L forearm sup/pro ? ?05/26/21 ?Weight bearing ?RUE NMR in  supine ?UE Ranger ? ? ?PATIENT EDUCATION: ?Education details: Continued condition-specific education, including neuro reed and typical recovery patterns, benefit of somatosensory input/weight bearing exercises, and motor learning ?Person educated: Patient ?Education method: Explanation ?Education comprehension: verbalized  understanding ?  ? ?HOME EXERCISE PROGRAM: ?[place] Code ?  ?  ?GOALS: ?Goals reviewed with patient? Yes ?  ?SHORT TERM GOALS: Target date: 06/27/2021 ?  ?STG   Baseline Goal Status  ?1 Pt will demonstrate independence w/ initial HEP designed for NMR and stretching of LUE N/A IN PROGRESS  ?2 Pt will be able to complete a task in standing for at least 5 min at CGA to improve participation in IADL tasks Decreased standing tolerance/balance IN PROGRESS  ?3 Pt will be able to cut food w/ Mod I, using AE prn Unable to cut tough food IN PROGRESS  ?  ?LONG TERM GOALS: Target date: 08/01/2021 ?  ?LTG   Baseline Goal Status  ?1 Pt will be able to complete LB dressing at setup assist, incorporating compensatory strategies/AE prn Mod A IN PROGRESS  ?2 Pt will be able to complete meal prep activity w/ Mod I by d/c Able to complete simple snack prep IN PROGRESS  ?3 Pt will demonstrate completion of simulated medication management activity at [facility] for safety Min A IN PROGRESS  ?4 Pt will be able to safely complete simulated tub/shower transfer at [place]  ?5 FOTO goal -- to be assessed TBD IN PROGRESS  ?  ?ASSESSMENT: ?  ?CLINICAL IMPRESSION: ?Pt continues to make good progress and demonstrates improved L shoulder activation this session compared to prior sessions! To address scapular stability w/ additional benefit of increased proprioceptive and somatosensory input for NMR, OT incorporated weight bearing exercises this session. Pt able to complete wb activity w/out difficulty w/ OT support on L side prn. HEP handout administered to pt this session and pt will likely benefit from review of exercises next session. ?  ?PERFORMANCE DEFICITS in functional skills including ADLs, IADLs, coordination, dexterity, proprioception, sensation, edema, tone, ROM, strength, FMC, GMC, mobility, balance, body mechanics, decreased knowledge of precautions, and decreased knowledge of use of DME, cognitive skills including attention,  memory, perception, problem solving, safety awareness, and sequencing, and psychosocial skills including environmental adaptation.  ?  ?IMPAIRMENTS are limiting patient from ADLs, IADLs, rest and sleep, work, leisure, and social participation.  ?  ?COMORBIDITIES may have co-morbidities that affects occupational performance. Patient will benefit from skilled OT to address above impairments and improve overall function. ?   ?  ?PLAN: ?OT FREQUENCY: 1x/week; decreased frequency due to self-pay and/or potential insurance-based visit limit ?  ?OT DURATION: 10 weeks ?  ?PLANNED INTERVENTIONS: self care/ADL training, therapeutic exercise, therapeutic activity, neuromuscular re-education, manual therapy, passive range of motion, functional mobility training, aquatic therapy, splinting, electrical stimulation, ultrasound, moist heat, cryotherapy, patient/family education, cognitive remediation/compensation, visual/perceptual remediation/compensation, and DME and/or AE instructions ?  ?RECOMMENDED OTHER SERVICES: Currently receiving PT and ST at this location ?  ?CONSULTED AND AGREED WITH PLAN OF CARE: Patient ?  ?PLAN FOR NEXT SESSION: NMR of LUE (weight bearing, AROM in gravity min/assisted positions); assess visual perception (visual scanning, visual fields, finger-to-nose); practice LB dressing (donning socks) ? ? ?Kathrine Cords, MSOT, OTR/L ?06/12/2021, 6:04 PM ?

## 2021-06-16 ENCOUNTER — Ambulatory Visit: Payer: Self-pay | Attending: Physical Medicine and Rehabilitation | Admitting: Physical Therapy

## 2021-06-16 ENCOUNTER — Encounter: Payer: Self-pay | Admitting: Physical Therapy

## 2021-06-16 DIAGNOSIS — R5381 Other malaise: Secondary | ICD-10-CM

## 2021-06-16 DIAGNOSIS — R2681 Unsteadiness on feet: Secondary | ICD-10-CM

## 2021-06-16 DIAGNOSIS — R262 Difficulty in walking, not elsewhere classified: Secondary | ICD-10-CM

## 2021-06-16 NOTE — Therapy (Signed)
[facility] ?[address] ?[place], [place], [address] ?Phone: [phone]   Fax:  [phone] ? ?Physical Therapy Treatment ? ?Patient Details  ?Name: Miguel Mccullough ?MRN: [id number] ?Date of Birth: 1965-06-25 ?Referring Provider (PT): Megan Lovorn ? ? ?Encounter Date: 06/16/2021 ? ? PT End of Session - 06/16/21 1623   ? ? Visit Number 4   ? Number of Visits 10   ? Date for PT Re-Evaluation 07/28/21   ? Authorization Type Self pay   ? Authorization Time Period 05/19/21 to 07/28/21   ? PT Start Time 6270   arrived late  ? PT Stop Time 3500   ? PT Time Calculation (min) 36 min   ? Equipment Utilized During Treatment Gait belt   ? Activity Tolerance Patient tolerated treatment well   ? Behavior During Therapy [facility] for tasks assessed/performed   ? ?  ?  ? ?  ? ? ?Past Medical History:  ?Diagnosis Date  ? COVID-19 2021  ? continues to have increase in suptum production.  ? Hypertension   ? ? ?History reviewed. No pertinent surgical history. ? ?There were no vitals filed for this visit. ? ? Subjective Assessment - 06/16/21 1541   ? ? Subjective I still want to work on my shoulder. Otherwise I'm up for anything. Steps are going OK, I can do a lot of what I'd like to be able to do   ? Patient Stated Goals be able to walk again, get left leg moving   ? Currently in Pain? No/denies   ? ?  ?  ? ?  ? ? ? ? ? ? ? ? ? ? ? ? ? ? ? ? ? ? ? ? [place] Adult PT Treatment/Exercise - 06/16/21 0001   ? ?  ? Knee/Hip Exercises: Aerobic  ? Nustep Nustep L5 BLEs only, velcro strap around BLEs to keep L LE in a good position   ?  ? Knee/Hip Exercises: Standing  ? Gait Training gait training though session with SBQC and min guard; stil has ongoing toe drag and limited step through and weight shift over L LE; step training on three 4 inch steps sideways step up pattern, tried stepping down side ways and forwards- actually safer sidestepping down with R LE leading. Did have one incident where L LE  "stuck" during gait and required totalA to maintain balance.   ? Other Standing Knee Exercises cross midline reaches for wt shift over L LE and dynamic postural control 2 roundsj; forward and lateral stepping over red TB with L LE followed by weight shift over onto L LE Mod facilitation   ? ?  ?  ? ?  ? ? ? ? ? ? ? ? ? ? PT Education - 06/16/21 1622   ? ? Education Details safe stair technique at home, exercise form, continue to have family assist him at home with steps   ? Person(s) Educated Patient   ? Methods Explanation   ? Comprehension Verbalized understanding   ? ?  ?  ? ?  ? ? ? PT Short Term Goals - 05/26/21 1819   ? ?  ? PT SHORT TERM GOAL #1  ? Title Will be able to perform bed mobility with no more than S level assist   ? Baseline CGA   ? Time 4   ? Period Weeks   ? Status On-going   ? Target Date 06/23/21   ?  ? PT  SHORT TERM GOAL #2  ? Title Will be able to perform functional stand and squat pivot transfers with no more than S level assist   ? Baseline CGA   ? Time 4   ? Period Weeks   ? Status On-going   ?  ? PT SHORT TERM GOAL #3  ? Title Will be able to maintain static standing for at least 5 minutes in complete midline with equal WB each LE   ? Time 4   ? Period Weeks   ? Status On-going   ?  ? PT SHORT TERM GOAL #4  ? Title Will appropriately attend to and recognize visual stimulation on L side on 4/5 attempts to show improving L inattention   ? Time 4   ? Period Weeks   ? Status On-going   ?  ? PT SHORT TERM GOAL #5  ? Title Will be able to gait train at least 58ft with LRAD and no more than min guard assist   ? Time 4   ? Period Weeks   ? Status On-going   ? ?  ?  ? ?  ? ? ? ? PT Long Term Goals - 05/19/21 1736   ? ?  ? PT LONG TERM GOAL #1  ? Title MMT to improve by 1 grade in all weak groups in BLEs   ? Time 10   ? Period Weeks   ? Status New   ? Target Date 07/28/21   ?  ? PT LONG TERM GOAL #2  ? Title Will be able to gait train at least 113ft with LRAD and no more than distant S assist   ?  Time 10   ? Period Weeks   ? Status New   ?  ? PT LONG TERM GOAL #3  ? Title Will be able to navigate at least 4 steps with U rail and no more than Min guard assistance   ? Time 10   ? Period Weeks   ? Status New   ?  ? PT LONG TERM GOAL #4  ? Title Will be able to perform all bed mobility and functional transfers on a Mod(I) level with LRAD   ? Time 10   ? Period Weeks   ? Status New   ?  ? PT LONG TERM GOAL #5  ? Title Will be able to complete TUG in 25 seconds or less with LRAD to show improved functional balance   ? Time 10   ? Period Weeks   ? Status New   ? ?  ?  ? ?  ? ? ? ? ? ? ? ? Plan - 06/16/21 1623   ? ? Clinical Impression Statement Miguel Mccullough arrives with his [facility] today, doing OK. Tells me there?s not a lot that?s hard for him to do at this point, everything is going OK. We focused on reciprocal LE strengthening on the Nustep, then practiced stair training today as well. Otherwise continued working on functional coordination and weight shifting activities to try and normalize gait as much as possible. Will continue efforts.   ? Personal Factors and Comorbidities Finances;Comorbidity 2;Age   ? Examination-Activity Limitations Locomotion Level;Transfers;Bed Mobility;Sit;Squat;Stairs;Stand   ? Examination-Participation Restrictions Community Activity;Driving;Shop;Yard Work   ? Stability/Clinical Decision Making Evolving/Moderate complexity   ? Clinical Decision Making Moderate   ? Rehab Potential Good   ? PT Frequency 1x / week   ? PT Duration Other (comment)   ? PT Treatment/Interventions ADLs/Self  Care Home Management;Biofeedback;Cryotherapy;Electrical Stimulation;DME Instruction;Gait training;Stair training;Functional mobility training;Therapeutic activities;Therapeutic exercise;Balance training;Neuromuscular re-education;Cognitive remediation;Patient/family education;Wheelchair mobility training;Manual techniques;Energy conservation;Taping;Visual/perceptual remediation/compensation   ? PT Next Visit Plan  work on standing weight shifting over L LE/standing tasks, stair training, gait training   ? PT Home Exercise Plan sitting/stanidng in midline at home with family/RW using mirror, L LE anlke DF, LAQs, hip flexion against gravity   ? Consulted and Agree with Plan of Care Patient   ? ?  ?  ? ?  ? ? ?Patient will benefit from skilled therapeutic intervention in order to improve the following deficits and impairments:  Abnormal gait, Decreased coordination, Difficulty walking, Increased fascial restricitons, Cardiopulmonary status limiting activity, Impaired UE functional use, Decreased activity tolerance, Impaired vision/preception, Decreased balance, Decreased knowledge of use of DME, Decreased cognition, Decreased mobility, Decreased strength, Impaired sensation, Postural dysfunction ? ?Visit Diagnosis: ?Difficulty in walking, not elsewhere classified ? ?Unsteadiness on feet ? ?Debility ? ? ? ? ?Problem List ?Patient Active Problem List  ? Diagnosis Date Noted  ? Neglect of one side of body 06/13/2021  ? Abnormality of gait 06/13/2021  ? AMS (altered mental status) 05/24/2021  ? Aphasia 05/24/2021  ? History of intracranial hemorrhage 05/24/2021  ? 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 Stage 3b chronic kidney disease ([place]) 05/20/2021  ? Hypokalemia 05/20/2021  ? Mild major depression ([place]) 05/20/2021  ? Acute renal failure ([place]) 05/15/2021  ? Elevated lipids 05/15/2021  ? Spasticity 05/15/2021  ? Depression 05/15/2021  ? Constipation 05/15/2021  ? ICH (intracerebral hemorrhage) ([place]) 04/13/2021  ? ?Minaal Struckman U PT, DPT, PN2  ? ?Supplemental Physical Therapist ?[facility]  ? ? ? ? ? ?[facility] ?[address] ?[place], [place], [address] ?Phone: [phone]   Fax:  [phone] ? ?Name: Miguel Mccullough ?MRN: [id number] ?Date of Birth: 1965-11-30 ? ? ? ?

## 2021-06-18 ENCOUNTER — Other Ambulatory Visit: Payer: Self-pay | Admitting: Internal Medicine

## 2021-06-18 ENCOUNTER — Ambulatory Visit: Payer: MEDICAID | Admitting: Diagnostic Neuroimaging

## 2021-06-18 ENCOUNTER — Other Ambulatory Visit: Payer: Self-pay

## 2021-06-18 ENCOUNTER — Encounter: Payer: Self-pay | Admitting: Diagnostic Neuroimaging

## 2021-06-18 VITALS — BP 160/92 | HR 67 | Ht 64.0 in | Wt 170.0 lb

## 2021-06-18 DIAGNOSIS — E269 Hyperaldosteronism, unspecified: Secondary | ICD-10-CM

## 2021-06-18 DIAGNOSIS — I610 Nontraumatic intracerebral hemorrhage in hemisphere, subcortical: Secondary | ICD-10-CM

## 2021-06-18 DIAGNOSIS — G40109 Localization-related (focal) (partial) symptomatic epilepsy and epileptic syndromes with simple partial seizures, not intractable, without status epilepticus: Secondary | ICD-10-CM

## 2021-06-18 DIAGNOSIS — G459 Transient cerebral ischemic attack, unspecified: Secondary | ICD-10-CM

## 2021-06-18 MED ORDER — LEVETIRACETAM 500 MG PO TABS
500.0000 mg | ORAL_TABLET | Freq: Two times a day (BID) | ORAL | 4 refills | Status: DC
  Filled 2021-06-18: qty 60, 30d supply, fill #0
  Filled 2021-07-09 – 2021-07-17 (×2): qty 60, 30d supply, fill #1
  Filled 2021-08-27: qty 60, 30d supply, fill #2
  Filled 2021-09-28: qty 60, 30d supply, fill #3
  Filled 2021-10-30: qty 60, 30d supply, fill #4
  Filled 2021-11-25 – 2021-11-27 (×2): qty 60, 30d supply, fill #5
  Filled 2021-12-30: qty 60, 30d supply, fill #6
  Filled 2022-01-28: qty 60, 30d supply, fill #7
  Filled 2022-02-25 (×2): qty 60, 30d supply, fill #8
  Filled 2022-03-23 – 2022-03-30 (×3): qty 60, 30d supply, fill #9
  Filled 2022-04-20: qty 60, 30d supply, fill #10
  Filled 2022-05-18 (×2): qty 60, 30d supply, fill #11
  Filled 2022-06-17 (×2): qty 60, 30d supply, fill #12

## 2021-06-18 NOTE — Progress Notes (Signed)
? ?[facility] ? ?PATIENT: Miguel Mccullough ?DOB: Sep 27, 1965 ? ?REFERRING CLINICIAN: Love, Ivan Anchors, PA-C ?HISTORY FROM: patient  ?REASON FOR VISIT: new consult  ? ? ?HISTORICAL ? ?CHIEF COMPLAINT:  ?Chief Complaint  ?Patient presents with  ? Transient Ischemic Attack  ?  Rm 7 New Pt  wife- [name] "getting therapies, no concerns"   ? ? ?HISTORY OF PRESENT ILLNESS:  ? ?56 year old male with hypertension, hyperlipidemia, chronic kidney disease, millimeters ganglia hemorrhagic stroke in February 2023 with residual left hemiparesis, presented to the hospital in April 2023 with multiple episodes of right-sided weakness and aphasia.  Each episode lasted 4 to 5 minutes.  Patient was diagnosed with left brain TIA.  Work-up was completed.  EEG was negative for epileptiform discharges.  Patient was diagnosed with TIAs versus seizures.  He was discharged on aspirin and Keppra. ? ?Since that time no further events. ?Patient doing well. ? ? ?REVIEW OF SYSTEMS: Full 14 system review of systems performed and negative with exception of: As per HPI. ? ?ALLERGIES: ?No Known Allergies ? ?HOME MEDICATIONS: ?Outpatient Medications Prior to Visit  ?Medication Sig Dispense Refill  ? acetaminophen (TYLENOL) 325 MG tablet Take 1-2 tablets (325-650 mg total) by mouth every 4 (four) hours as needed for mild pain. 100 tablet 0  ? amantadine (SYMMETREL) 100 MG capsule Take 1 capsule (100 mg total) by mouth daily. 30 capsule 2  ? amLODipine (NORVASC) 10 MG tablet Take 1 tablet (10 mg total) by mouth at bedtime. 30 tablet 6  ? aspirin 81 MG EC tablet Take 1 tablet (81 mg total) by mouth daily. Swallow whole. 90 tablet 2  ? atorvastatin (LIPITOR) 40 MG tablet Take 1 tablet (40 mg total) by mouth at bedtime. 30 tablet 6  ? baclofen (LIORESAL) 10 MG tablet Take 1 tablet (10 mg total) by mouth 3 (three) times daily. 90 each 6  ? citalopram (CELEXA) 20 MG tablet Take 1 tablet (20 mg total) by mouth daily. 30 tablet 3  ? cloNIDine  (CATAPRES - DOSED IN MG/24 HR) 0.3 mg/24hr patch Place 1 patch (0.3 mg total) onto the skin once a week. (Patient taking differently: Place 0.3 mg onto the skin every Friday.) 4 patch 6  ? hydrALAZINE (APRESOLINE) 100 MG tablet Take 1 tablet (100 mg total) by mouth every 8 (eight) hours. 90 tablet 2  ? hydrochlorothiazide (HYDRODIURIL) 12.5 MG tablet Take 1 tablet (12.5 mg total) by mouth daily. 30 tablet 2  ? magnesium oxide (MAG-OX) 400 MG tablet Take 1 tablet (400 mg total) by mouth daily. 30 tablet 3  ? metoprolol tartrate (LOPRESSOR) 50 MG tablet Take 1 & 1/2 tablets (75 mg total) by mouth 2 (two) times daily. 90 tablet 6  ? pantoprazole (PROTONIX) 40 MG tablet Take 1 tablet (40 mg total) by mouth at bedtime. 30 tablet 1  ? potassium chloride SA (KLOR-CON M) 20 MEQ tablet Take 1 tablet (20 mEq total) by mouth 2 (two) times daily. 60 tablet 4  ? senna-docusate (SENOKOT-S) 8.6-50 MG tablet Take 2 tablets by mouth at bedtime as needed for mild constipation. (Patient taking differently: Take 2 tablets by mouth See admin instructions. Take 2 tablets by mouth every other night) 60 tablet 3  ? traZODone (DESYREL) 150 MG tablet Take 0.5 tablets (75 mg total) by mouth at bedtime. 30 tablet 6  ? levETIRAcetam (KEPPRA) 500 MG tablet Take 1 tablet (500 mg total) by mouth 2 (two) times daily. 60 tablet 2  ? fluticasone (FLONASE)  50 MCG/ACT nasal spray Place 1 spray into both nostrils daily. (Patient not taking: Reported on 05/24/2021) 16 g 0  ? Homeopathic Products (COLD-EEZE) LOZG Use as directed 1 lozenge in the mouth or throat as needed (to boost the immune system).    ? ?No facility-administered medications prior to visit.  ? ? ?PAST MEDICAL HISTORY: ?Past Medical History:  ?Diagnosis Date  ? COVID-19 2021  ? continues to have increase in suptum production.  ? Hypertension   ? TIA (transient ischemic attack)   ? ? ?PAST SURGICAL HISTORY: ?History reviewed. No pertinent surgical history. ? ?FAMILY HISTORY: ?Family History   ?Problem Relation Age of Onset  ? Hypertension Mother   ? Hypertension Father   ? Hypertension Sister   ? Hypertension Brother   ? ? ?SOCIAL HISTORY: ?Social History  ? ?Socioeconomic History  ? Marital status: Married  ?  Spouse name: [name]  ? Number of children: 1  ? Years of education: Not on file  ? Highest education level: 12th grade  ?Occupational History  ? Not on file  ?Tobacco Use  ? Smoking status: Never  ? Smokeless tobacco: Never  ?Vaping Use  ? Vaping Use: Never used  ?Substance and Sexual Activity  ? Alcohol use: Not Currently  ? Drug use: Never  ? Sexual activity: Not Currently  ?Other Topics Concern  ? Not on file  ?Social History Narrative  ? Lives with wife  ? ?Social Determinants of Health  ? ?Financial Resource Strain: Not on file  ?Food Insecurity: Not on file  ?Transportation Needs: Not on file  ?Physical Activity: Not on file  ?Stress: Not on file  ?Social Connections: Not on file  ?Intimate Partner Violence: Not on file  ? ? ? ?PHYSICAL EXAM ? ?GENERAL EXAM/CONSTITUTIONAL: ?Vitals:  ?Vitals:  ? 06/18/21 1113  ?BP: (!) 160/92  ?Pulse: 67  ?Weight: 170 lb (77.1 kg)  ?Height: 5\' 4"  (1.626 m)  ? ?Body mass index is 29.18 kg/m?. ?Wt Readings from Last 3 Encounters:  ?06/18/21 170 lb (77.1 kg)  ?06/13/21 170 lb (77.1 kg)  ?05/24/21 180 lb (81.6 kg)  ? ?Patient is in no distress; well developed, nourished and groomed; neck is supple ? ?CARDIOVASCULAR: ?Examination of carotid arteries is normal; no carotid bruits ?Regular rate and rhythm, no murmurs ?Examination of peripheral vascular system by observation and palpation is normal ? ?EYES: ?Ophthalmoscopic exam of optic discs and posterior segments is normal; no papilledema or hemorrhages ?No results found. ? ?MUSCULOSKELETAL: ?Gait, strength, tone, movements noted in Neurologic exam below ? ?NEUROLOGIC: ?MENTAL STATUS:  ?   ? View : No data to display.  ?  ?  ?  ? ?awake, alert, oriented to person, place and time ?recent and remote memory  intact ?normal attention and concentration ?language fluent, comprehension intact, naming intact ?fund of knowledge appropriate ? ?CRANIAL NERVE:  ?2nd - no papilledema on fundoscopic exam ?2nd, 3rd, 4th, 6th - pupils equal and reactive to light, visual fields full to confrontation, extraocular muscles intact, no nystagmus ?5th - facial sensation symmetric ?7th - facial strength --> SLIGHTLY DECR LEFT LOWER FACIAL STRENGTH ?8th - hearing intact ?9th - palate elevates symmetrically, uvula midline ?11th - shoulder shrug symmetric ?12th - tongue protrusion midline ? ?MOTOR:  ?normal bulk and tone, full strength in the RUE, RLE ?INCREASED TONE ON LEFT SIDE --> LUE 1-2; LLE 2-3 ? ?SENSORY:  ?normal and symmetric to light touch, temperature, vibration ? ?COORDINATION:  ?finger-nose-finger, fine finger movements normal ? ?REFLEXES:  ?  deep tendon reflexes 1+; SLIGHTLY INCREASED ON LEFT SIDE ? ?GAIT/STATION:  ?USING CANE; MILD LET HEMIPARETIC GAIT ? ? ? ? ?DIAGNOSTIC DATA (LABS, IMAGING, TESTING) ?- I reviewed patient records, labs, notes, testing and imaging myself where available. ? ?Lab Results  ?Component Value Date  ? WBC 8.7 05/24/2021  ? HGB 14.6 05/24/2021  ? HCT 43.0 05/24/2021  ? MCV 88.0 05/24/2021  ? PLT 253 05/24/2021  ? ?   ?Component Value Date/Time  ? NA 142 05/24/2021 1641  ? NA 142 05/20/2021 1532  ? K 4.3 05/24/2021 1641  ? CL 104 05/24/2021 1641  ? CO2 26 05/24/2021 1609  ? GLUCOSE 87 05/24/2021 1641  ? BUN 22 (H) 05/24/2021 1641  ? BUN 22 05/20/2021 1532  ? CREATININE 1.90 (H) 05/24/2021 1641  ? CALCIUM 9.3 05/24/2021 1609  ? PROT 7.4 05/24/2021 1609  ? ALBUMIN 4.0 05/24/2021 1609  ? AST 26 05/24/2021 1609  ? ALT 42 05/24/2021 1609  ? ALKPHOS 144 (H) 05/24/2021 1609  ? BILITOT 0.4 05/24/2021 1609  ? GFRNONAA 48 (L) 05/24/2021 1609  ? ?Lab Results  ?Component Value Date  ? CHOL 101 05/25/2021  ? HDL 47 05/25/2021  ? LDLCALC 49 05/25/2021  ? TRIG 24 05/25/2021  ? CHOLHDL 2.1 05/25/2021  ? ?Lab Results   ?Component Value Date  ? HGBA1C 5.1 05/25/2021  ? ?No results found for: VITAMINB12 ?Lab Results  ?Component Value Date  ? TSH 3.456 05/25/2021  ? ? ?05/24/21 MRI brain (without)  ?1. Decreased size of right thalam

## 2021-06-18 NOTE — Patient Instructions (Signed)
Right basal ganglia hemorrhage (left sided weakness) ?- continue BP control ? ?Left brain events (right sided weakness, aphasia, 3-4 minutes, 4 events) ?- suspect left brain TIA; continue aspirin 81mg  daily and BP control, statin ? ?- possible left brain seizure, and now on levetiracetam 500mg  twice a day; continue levetiracetam for now; may consider to wean off after 2-3 years ? ?- According to [place] law, you can not drive unless you are seizure / syncope free for at least 6 months and under physician's care.  ? ?- Please maintain precautions. Do not participate in activities where a loss of awareness could harm you or someone else. No swimming alone, no tub bathing, no hot tubs, no driving, no operating motorized vehicles (cars, ATVs, motocycles, etc), lawnmowers, power tools or firearms. No standing at heights, such as rooftops, ladders or stairs. Avoid hot objects such as stoves, heaters, open fires. Wear a helmet when riding a bicycle, scooter, skateboard, etc. and avoid areas of traffic. Set your water heater to 120 degrees or less.  ?

## 2021-06-19 ENCOUNTER — Ambulatory Visit: Payer: Self-pay | Admitting: Occupational Therapy

## 2021-06-19 ENCOUNTER — Ambulatory Visit: Payer: Self-pay | Admitting: Speech Pathology

## 2021-06-23 ENCOUNTER — Ambulatory Visit: Payer: Self-pay | Admitting: Physical Therapy

## 2021-06-23 ENCOUNTER — Other Ambulatory Visit: Payer: Self-pay

## 2021-06-23 ENCOUNTER — Encounter: Payer: Self-pay | Admitting: Diagnostic Neuroimaging

## 2021-06-24 ENCOUNTER — Other Ambulatory Visit: Payer: Self-pay

## 2021-06-26 ENCOUNTER — Ambulatory Visit: Payer: Self-pay | Admitting: Speech Pathology

## 2021-06-26 ENCOUNTER — Ambulatory Visit: Payer: Self-pay | Admitting: Occupational Therapy

## 2021-06-30 ENCOUNTER — Ambulatory Visit: Payer: Self-pay | Admitting: Physical Therapy

## 2021-06-30 ENCOUNTER — Ambulatory Visit: Payer: Self-pay | Admitting: Speech Pathology

## 2021-07-03 ENCOUNTER — Ambulatory Visit: Payer: Self-pay | Admitting: Occupational Therapy

## 2021-07-07 ENCOUNTER — Ambulatory Visit: Payer: Self-pay | Admitting: Physical Therapy

## 2021-07-09 ENCOUNTER — Other Ambulatory Visit: Payer: Self-pay | Admitting: Physical Medicine and Rehabilitation

## 2021-07-09 ENCOUNTER — Other Ambulatory Visit: Payer: Self-pay

## 2021-07-10 ENCOUNTER — Ambulatory Visit: Payer: Self-pay | Admitting: Speech Pathology

## 2021-07-10 ENCOUNTER — Other Ambulatory Visit: Payer: Self-pay

## 2021-07-10 ENCOUNTER — Ambulatory Visit: Payer: Self-pay | Admitting: Occupational Therapy

## 2021-07-11 ENCOUNTER — Other Ambulatory Visit: Payer: Self-pay

## 2021-07-15 ENCOUNTER — Ambulatory Visit: Payer: Self-pay | Admitting: Physical Therapy

## 2021-07-17 ENCOUNTER — Ambulatory Visit: Payer: Self-pay | Admitting: Speech Pathology

## 2021-07-17 ENCOUNTER — Ambulatory Visit: Payer: Self-pay | Admitting: Occupational Therapy

## 2021-07-18 ENCOUNTER — Other Ambulatory Visit: Payer: Self-pay

## 2021-07-21 ENCOUNTER — Ambulatory Visit: Payer: Self-pay | Admitting: Physical Therapy

## 2021-07-21 ENCOUNTER — Other Ambulatory Visit: Payer: Self-pay

## 2021-07-24 ENCOUNTER — Other Ambulatory Visit: Payer: Self-pay

## 2021-07-28 ENCOUNTER — Other Ambulatory Visit: Payer: Self-pay

## 2021-07-28 ENCOUNTER — Ambulatory Visit: Payer: Self-pay | Attending: Internal Medicine | Admitting: Internal Medicine

## 2021-07-28 VITALS — BP 154/89 | HR 63 | Temp 98.1°F | Resp 16 | Wt 173.6 lb

## 2021-07-28 DIAGNOSIS — I6930 Unspecified sequelae of cerebral infarction: Secondary | ICD-10-CM

## 2021-07-28 DIAGNOSIS — I10 Essential (primary) hypertension: Secondary | ICD-10-CM

## 2021-07-28 DIAGNOSIS — Z23 Encounter for immunization: Secondary | ICD-10-CM

## 2021-07-28 DIAGNOSIS — Z1211 Encounter for screening for malignant neoplasm of colon: Secondary | ICD-10-CM

## 2021-07-28 MED ORDER — SPIRONOLACTONE 25 MG PO TABS
25.0000 mg | ORAL_TABLET | Freq: Every day | ORAL | 2 refills | Status: DC
  Filled 2021-07-28: qty 30, 30d supply, fill #0
  Filled 2021-08-07 – 2021-08-21 (×3): qty 30, 30d supply, fill #1
  Filled 2021-08-31: qty 30, 30d supply, fill #2

## 2021-07-28 NOTE — Progress Notes (Signed)
Patient ID: Miguel Mccullough, male    DOB: 09/21/1965  MRN: [id number]  CC: Chronic disease management and hospital follow-up.   Subjective: Miguel Mccullough is a 56 y.o. male who presents for 90-month follow-up visit for chronic disease management and hospital follow-up. 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  MRI of the brain revealed no acute ischemia, decreased size of right thalamic intraparenchymal hemorrhage with minimal residual edema, numerous chronic microhemorrhages in the cerebrum, brainstem and cerebellum consistent with chronic hypertensive angiopathy. Seen by PMR Dr. Alice Rieger in follow-up 06/13/2021.  He was continued on baclofen for spasticity.  Ritalin was stopped due to blood pressure issues.  Now on amantadine. Saw neurologist Dr. Leta Baptist 06/18/2021.  Patient with diagnosis of possible left brain seizure for which he is now on Keppra. He awaits appointment with endocrinology for increased aldosterone/renin ratio of 55.8  Today: HTN/CVA: Currently on amlodipine 10 mg, HCTZ 12.5 mg daily, metoprolol 75 mg twice a day, hydralazine 100 mg 3 times a day.  Still having to take potassium supplement twice a day. Reports compliance with taking medicines.  Took a.m meds already today 157/98 is his average and depends on what he is doing.  Lowest 145/89.   No appt with endocrine as yet.    Currently getting an 4 hrs/wk of pro bono physical therapy through [facility] physical therapy program.  He has found the sessions helpful so far.  He is walking better.  They are also working on his left shoulder to increase range of motion.  He can elevate the left arm a little.  He is in a wheelchair today but at home he uses his cane  or walker.  Had 1 slight fall about 2 weeks ago.  No injuries.  HM: He declines Shingrix vaccine.  He is agreeable to colon cancer screening with the fit test.  Agreeable to receiving Tdap. 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 100 tablet 0   amantadine (SYMMETREL) 100 MG capsule Take 1 capsule (100 mg total) by mouth daily. 30 capsule 2   amLODipine (NORVASC) 10 MG tablet Take 1 tablet (10 mg total) by mouth at bedtime. 30 tablet 6   aspirin 81 MG EC tablet Take 1 tablet (81 mg total) by mouth daily. Swallow whole. 90 tablet 2   atorvastatin (LIPITOR) 40 MG tablet Take 1 tablet (40 mg total) by mouth  at bedtime. 30 tablet 6   baclofen (LIORESAL) 10 MG tablet Take 1 tablet (10 mg total) by mouth 3 (three) times daily. 90 each 6   citalopram (CELEXA) 20 MG tablet Take 1 tablet (20 mg total) by mouth daily. 30 tablet 3   cloNIDine (CATAPRES - DOSED IN MG/24 HR) 0.3 mg/24hr patch Place 1 patch (0.3 mg total) onto the skin once a week. (Patient taking differently: Place 0.3 mg onto the skin every Friday.) 4 patch 6   hydrALAZINE (APRESOLINE) 100 MG tablet Take 1 tablet  (100 mg total) by mouth every 8 (eight) hours. 90 tablet 2   hydrochlorothiazide (HYDRODIURIL) 12.5 MG tablet Take 1 tablet (12.5 mg total) by mouth daily. 30 tablet 2   levETIRAcetam (KEPPRA) 500 MG tablet Take 1 tablet (500 mg total) by mouth 2 (two) times daily. 180 tablet 4   magnesium oxide (MAG-OX) 400 MG tablet Take 1 tablet (400 mg total) by mouth daily. 30 tablet 3   metoprolol tartrate (LOPRESSOR) 50 MG tablet Take 1 & 1/2 tablets (75 mg total) by mouth 2 (two) times daily. 90 tablet 6   pantoprazole (PROTONIX) 40 MG tablet Take 1 tablet (40 mg total) by mouth at bedtime. 30 tablet 1   potassium chloride SA (KLOR-CON M) 20 MEQ tablet Take 1 tablet (20 mEq total) by mouth 2 (two) times daily. 60 tablet 4   senna-docusate (SENOKOT-S) 8.6-50 MG tablet Take 2 tablets by mouth at bedtime as needed for mild constipation. (Patient taking differently: Take 2 tablets by mouth See admin instructions. Take 2 tablets by mouth every other night) 60 tablet 3   traZODone (DESYREL) 150 MG tablet Take 0.5 tablets (75 mg total) by mouth at bedtime. 30 tablet 6   No current facility-administered medications on file prior to visit.    No Known Allergies  Social History   Socioeconomic History   Marital status: Married    Spouse name: [name]   Number of children: 1   Years of education: Not on file   Highest education level: 12th grade  Occupational History   Not on file  Tobacco Use   Smoking status: Never   Smokeless tobacco: Never  Vaping Use   Vaping Use: Never used  Substance and Sexual Activity   Alcohol use: Not Currently   Drug use: Never   Sexual activity: Not Currently  Other Topics Concern   Not on file  Social History Narrative   Lives with wife   Social Determinants of Health   Financial Resource Strain: Not on file  Food Insecurity: Not on file  Transportation Needs: Not on file  Physical Activity: Not on file  Stress: Not on file  Social Connections: Not on file   Intimate Partner Violence: Not on file    Family History  Problem Relation Age of Onset   Hypertension Mother    Hypertension Father    Hypertension Sister    Hypertension Brother     No past surgical history on file.  ROS: Review of Systems Negative except as stated above  PHYSICAL EXAM: BP (!) 154/89   Pulse 63   Temp 98.1 F (36.7 C) (Oral)   Resp 16   Wt 173 lb 9.6 oz (78.7 kg)   SpO2 96%   BMI 29.80 kg/m   Physical Exam   General appearance - alert, well appearing, middle-aged African-American male and in no distress Mental status - normal mood, behavior, speech, dress, motor activity, and thought processes Chest -  clear to auscultation, no wheezes, rales or rhonchi, symmetric air entry Heart - normal rate, regular rhythm, normal S1, S2, no murmurs, rubs, clicks or gallops Neurological -he is able to elevate the left arm against gravity to about 70 degrees. Extremities -trace bilateral lower extremity edema     Latest Ref Rng & Units 05/24/2021    4:41 PM 05/24/2021    4:09 PM 05/20/2021    3:32 PM  CMP  Glucose 70 - 99 mg/dL 87  88  83   BUN 6 - 20 mg/dL 22  20  22    Creatinine 0.61 - 1.24 mg/dL 1.90  1.68  1.80   Sodium 135 - 145 mmol/L 142  140  142   Potassium 3.5 - 5.1 mmol/L 4.3  4.3  4.4   Chloride 98 - 111 mmol/L 104  107  103   CO2 22 - 32 mmol/L  26  25   Calcium 8.9 - 10.3 mg/dL  9.3  9.6   Total Protein 6.5 - 8.1 g/dL  7.4    Total Bilirubin 0.3 - 1.2 mg/dL  0.4    Alkaline Phos 38 - 126 U/L  144    AST 15 - 41 U/L  26    ALT 0 - 44 U/L  42     Lipid Panel     Component Value Date/Time   CHOL 101 05/25/2021 0436   TRIG 24 05/25/2021 0436   HDL 47 05/25/2021 0436   CHOLHDL 2.1 05/25/2021 0436   VLDL 5 05/25/2021 0436   LDLCALC 49 05/25/2021 0436    CBC    Component Value Date/Time   WBC 8.7 05/24/2021 1609   RBC 4.84 05/24/2021 1609   HGB 14.6 05/24/2021 1641   HCT 43.0 05/24/2021 1641   PLT 253 05/24/2021 1609   MCV 88.0  05/24/2021 1609   MCH 30.0 05/24/2021 1609   MCHC 34.0 05/24/2021 1609   RDW 13.2 05/24/2021 1609   LYMPHSABS 1.3 05/24/2021 1609   MONOABS 0.6 05/24/2021 1609   EOSABS 0.3 05/24/2021 1609   BASOSABS 0.1 05/24/2021 1609    ASSESSMENT AND PLAN:  1. Essential hypertension Not at goal. I recommend stopping the HCTZ as this will probably bring down his potassium levels.  changing to spironolactone instead.  We will check basic metabolic panel today.  After he has been on the spironolactone for 1 week, I would like for him to return to the lab again to have potassium level rechecked. He should continue home blood pressure monitoring.  Follow-up with clinical pharmacist in several weeks for recheck. - Basic Metabolic Panel - spironolactone (ALDACTONE) 25 MG tablet; Take 1 tablet (25 mg total) by mouth once daily.  Dispense: 30 tablet; Refill: 2  2. History of hemorrhagic cerebrovascular accident (CVA) with residual deficit Strength improving with some physical therapy.  3. Need for Tdap vaccination Patient agreeable to receiving Tdap vaccine today. - Tdap vaccine greater than or equal to 7yo IM  4. Screening for colon cancer - Fecal occult blood, imunochemical([facility])  Patient was given the opportunity to ask questions.  Patient verbalized understanding of the plan and was able to repeat key elements of the plan.   This documentation was completed using Radio producer.  Any transcriptional errors are unintentional.  No orders of the defined types were placed in this encounter.    Requested Prescriptions    No prescriptions requested or ordered in this encounter    No follow-ups on file.  Neoma Laming  Wynetta Emery, MD, Rosalita Chessman

## 2021-07-28 NOTE — Progress Notes (Signed)
Patient said that he has no concerns today for provider. Patient said he is doing well.

## 2021-07-29 ENCOUNTER — Other Ambulatory Visit: Payer: Self-pay | Admitting: Internal Medicine

## 2021-07-29 ENCOUNTER — Other Ambulatory Visit: Payer: Self-pay

## 2021-07-29 DIAGNOSIS — N1832 Chronic kidney disease, stage 3b: Secondary | ICD-10-CM

## 2021-07-29 DIAGNOSIS — I10 Essential (primary) hypertension: Secondary | ICD-10-CM

## 2021-07-29 LAB — BASIC METABOLIC PANEL
BUN/Creatinine Ratio: 13 (ref 9–20)
BUN: 22 mg/dL (ref 6–24)
CO2: 26 mmol/L (ref 20–29)
Calcium: 9.7 mg/dL (ref 8.7–10.2)
Chloride: 100 mmol/L (ref 96–106)
Creatinine, Ser: 1.72 mg/dL — ABNORMAL HIGH (ref 0.76–1.27)
Glucose: 77 mg/dL (ref 70–99)
Potassium: 3.9 mmol/L (ref 3.5–5.2)
Sodium: 143 mmol/L (ref 134–144)
eGFR: 47 mL/min/{1.73_m2} — ABNORMAL LOW (ref >59)

## 2021-07-29 MED ORDER — POTASSIUM CHLORIDE CRYS ER 20 MEQ PO TBCR
20.0000 meq | EXTENDED_RELEASE_TABLET | Freq: Every day | ORAL | 4 refills | Status: DC
  Filled 2021-07-29 – 2021-08-07 (×2): qty 30, 30d supply, fill #0
  Filled 2021-08-27: qty 30, 30d supply, fill #1
  Filled 2021-10-06: qty 30, 30d supply, fill #2

## 2021-08-02 LAB — FECAL OCCULT BLOOD, IMMUNOCHEMICAL: Fecal Occult Bld: NEGATIVE

## 2021-08-07 ENCOUNTER — Other Ambulatory Visit: Payer: Self-pay | Admitting: Internal Medicine

## 2021-08-07 ENCOUNTER — Other Ambulatory Visit: Payer: Self-pay | Admitting: Physical Medicine and Rehabilitation

## 2021-08-08 ENCOUNTER — Other Ambulatory Visit: Payer: Self-pay

## 2021-08-08 MED ORDER — PANTOPRAZOLE SODIUM 40 MG PO TBEC
40.0000 mg | DELAYED_RELEASE_TABLET | Freq: Every day | ORAL | 1 refills | Status: DC
  Filled 2021-08-08: qty 30, 30d supply, fill #0
  Filled 2021-08-31: qty 30, 30d supply, fill #1

## 2021-08-11 ENCOUNTER — Other Ambulatory Visit: Payer: Self-pay

## 2021-08-12 ENCOUNTER — Other Ambulatory Visit: Payer: Self-pay

## 2021-08-14 ENCOUNTER — Other Ambulatory Visit: Payer: Self-pay

## 2021-08-15 ENCOUNTER — Other Ambulatory Visit: Payer: Self-pay

## 2021-08-21 ENCOUNTER — Other Ambulatory Visit: Payer: Self-pay

## 2021-08-27 ENCOUNTER — Other Ambulatory Visit: Payer: Self-pay | Admitting: Physical Medicine and Rehabilitation

## 2021-08-28 ENCOUNTER — Other Ambulatory Visit: Payer: Self-pay

## 2021-08-29 ENCOUNTER — Other Ambulatory Visit: Payer: Self-pay

## 2021-08-31 ENCOUNTER — Other Ambulatory Visit: Payer: Self-pay

## 2021-08-31 ENCOUNTER — Other Ambulatory Visit: Payer: Self-pay | Admitting: Physical Medicine and Rehabilitation

## 2021-09-01 ENCOUNTER — Other Ambulatory Visit: Payer: Self-pay

## 2021-09-01 MED ORDER — AMANTADINE HCL 100 MG PO CAPS
100.0000 mg | ORAL_CAPSULE | Freq: Every day | ORAL | 1 refills | Status: DC
  Filled 2021-09-01: qty 30, 30d supply, fill #0
  Filled 2021-10-06: qty 30, 30d supply, fill #1

## 2021-09-02 ENCOUNTER — Other Ambulatory Visit: Payer: Self-pay

## 2021-09-05 ENCOUNTER — Ambulatory Visit: Payer: Self-pay | Attending: Internal Medicine

## 2021-09-05 DIAGNOSIS — N1832 Chronic kidney disease, stage 3b: Secondary | ICD-10-CM

## 2021-09-05 DIAGNOSIS — I10 Essential (primary) hypertension: Secondary | ICD-10-CM

## 2021-09-06 LAB — BASIC METABOLIC PANEL
BUN/Creatinine Ratio: 14 (ref 9–20)
BUN: 29 mg/dL — ABNORMAL HIGH (ref 6–24)
CO2: 22 mmol/L (ref 20–29)
Calcium: 9.8 mg/dL (ref 8.7–10.2)
Chloride: 103 mmol/L (ref 96–106)
Creatinine, Ser: 2.1 mg/dL — ABNORMAL HIGH (ref 0.76–1.27)
Glucose: 69 mg/dL — ABNORMAL LOW (ref 70–99)
Potassium: 4.6 mmol/L (ref 3.5–5.2)
Sodium: 141 mmol/L (ref 134–144)
eGFR: 37 mL/min/{1.73_m2} — ABNORMAL LOW (ref >59)

## 2021-09-09 ENCOUNTER — Encounter: Payer: Self-pay | Admitting: Internal Medicine

## 2021-09-13 ENCOUNTER — Encounter: Payer: Self-pay | Admitting: Internal Medicine

## 2021-09-14 ENCOUNTER — Other Ambulatory Visit: Payer: Self-pay | Admitting: Internal Medicine

## 2021-09-14 DIAGNOSIS — I10 Essential (primary) hypertension: Secondary | ICD-10-CM

## 2021-09-14 MED ORDER — HYDRALAZINE HCL 100 MG PO TABS
100.0000 mg | ORAL_TABLET | Freq: Three times a day (TID) | ORAL | 2 refills | Status: DC
  Filled 2021-09-14: qty 90, 30d supply, fill #0
  Filled 2021-10-06: qty 90, 30d supply, fill #1
  Filled 2021-10-30: qty 90, 30d supply, fill #2
  Filled 2021-11-25 – 2021-11-27 (×2): qty 90, 30d supply, fill #3
  Filled 2021-12-30: qty 90, 30d supply, fill #4
  Filled 2022-01-25: qty 90, 30d supply, fill #5
  Filled 2022-02-25 (×2): qty 90, 30d supply, fill #6
  Filled 2022-03-28 – 2022-03-30 (×2): qty 90, 30d supply, fill #7
  Filled 2022-04-20: qty 90, 30d supply, fill #8

## 2021-09-15 ENCOUNTER — Other Ambulatory Visit: Payer: Self-pay

## 2021-09-29 ENCOUNTER — Other Ambulatory Visit: Payer: Self-pay

## 2021-10-02 ENCOUNTER — Encounter: Payer: Self-pay | Admitting: Internal Medicine

## 2021-10-02 ENCOUNTER — Other Ambulatory Visit: Payer: Self-pay

## 2021-10-06 ENCOUNTER — Encounter: Payer: Self-pay | Admitting: Physical Medicine and Rehabilitation

## 2021-10-06 ENCOUNTER — Other Ambulatory Visit: Payer: Self-pay | Admitting: Internal Medicine

## 2021-10-06 ENCOUNTER — Other Ambulatory Visit: Payer: Self-pay | Admitting: Physical Medicine and Rehabilitation

## 2021-10-06 ENCOUNTER — Other Ambulatory Visit: Payer: Self-pay

## 2021-10-06 DIAGNOSIS — F320 Major depressive disorder, single episode, mild: Secondary | ICD-10-CM

## 2021-10-06 MED ORDER — MAGNESIUM OXIDE 400 MG PO TABS
400.0000 mg | ORAL_TABLET | Freq: Every day | ORAL | 3 refills | Status: DC
  Filled 2021-10-06: qty 30, 30d supply, fill #0
  Filled 2021-10-30: qty 30, 30d supply, fill #1
  Filled 2021-11-25 – 2021-12-06 (×2): qty 30, 30d supply, fill #2
  Filled 2022-01-12: qty 30, 30d supply, fill #3

## 2021-10-06 MED ORDER — CITALOPRAM HYDROBROMIDE 20 MG PO TABS
20.0000 mg | ORAL_TABLET | Freq: Every day | ORAL | 3 refills | Status: DC
  Filled 2021-10-06: qty 30, 30d supply, fill #0
  Filled 2021-10-30: qty 30, 30d supply, fill #1
  Filled 2021-11-25 – 2021-11-27 (×2): qty 30, 30d supply, fill #2
  Filled 2021-12-30: qty 30, 30d supply, fill #3

## 2021-10-06 MED ORDER — PANTOPRAZOLE SODIUM 40 MG PO TBEC
40.0000 mg | DELAYED_RELEASE_TABLET | Freq: Every day | ORAL | 3 refills | Status: DC
  Filled 2021-10-06: qty 30, 30d supply, fill #0
  Filled 2021-10-30: qty 30, 30d supply, fill #1
  Filled 2021-11-25 – 2021-11-27 (×2): qty 30, 30d supply, fill #2
  Filled 2021-12-30: qty 30, 30d supply, fill #3

## 2021-10-07 ENCOUNTER — Other Ambulatory Visit: Payer: Self-pay

## 2021-10-17 ENCOUNTER — Other Ambulatory Visit: Payer: Self-pay | Admitting: Internal Medicine

## 2021-10-17 DIAGNOSIS — I10 Essential (primary) hypertension: Secondary | ICD-10-CM

## 2021-10-17 MED ORDER — SPIRONOLACTONE 25 MG PO TABS
25.0000 mg | ORAL_TABLET | Freq: Every day | ORAL | 2 refills | Status: DC
  Filled 2021-10-17: qty 30, 30d supply, fill #0
  Filled 2021-10-30: qty 30, 30d supply, fill #1
  Filled 2021-11-25 – 2021-11-27 (×2): qty 30, 30d supply, fill #2

## 2021-10-21 ENCOUNTER — Other Ambulatory Visit: Payer: Self-pay

## 2021-10-22 ENCOUNTER — Other Ambulatory Visit: Payer: Self-pay

## 2021-10-30 ENCOUNTER — Encounter: Payer: Self-pay | Admitting: Internal Medicine

## 2021-10-30 ENCOUNTER — Other Ambulatory Visit: Payer: Self-pay

## 2021-10-30 ENCOUNTER — Other Ambulatory Visit: Payer: Self-pay | Admitting: Internal Medicine

## 2021-10-31 ENCOUNTER — Other Ambulatory Visit: Payer: Self-pay

## 2021-10-31 ENCOUNTER — Encounter: Payer: Self-pay | Admitting: Internal Medicine

## 2021-10-31 ENCOUNTER — Telehealth: Payer: Self-pay | Admitting: Internal Medicine

## 2021-10-31 MED ORDER — AMANTADINE HCL 100 MG PO CAPS
100.0000 mg | ORAL_CAPSULE | Freq: Every day | ORAL | 2 refills | Status: DC
  Filled 2021-10-31: qty 30, 30d supply, fill #0
  Filled 2021-11-25 – 2021-11-27 (×2): qty 30, 30d supply, fill #1
  Filled 2021-12-30: qty 30, 30d supply, fill #2

## 2021-10-31 NOTE — Telephone Encounter (Signed)
-----   Message from Courtney Heys, MD sent at 10/31/2021 12:34 PM EDT ----- I usually try to wean pt's off after 1 year- I already tried to wean him off at 3 months- he didn't do well- his aphasia and initiation were poor/worse when off it-  If you try and wean him off it again, at the 1 year mark, that's fine- if he doesn't do well- I.e. aphasia and initiation get worse again, then he will need to be on lifelong-  Thanks SO much for asking- I truly appreciate that,  ML ----- Message ----- From: Ladell Pier, MD Sent: 10/30/2021   9:27 PM EDT To: Courtney Heys, MD  I am the PCP for this pt. You had him on Amantadine.  He requested RF from me in July which I did.  He is requesting RF again and I just wanted to touch base with you before I do.  Does he still need to be on this medication?

## 2021-11-01 ENCOUNTER — Other Ambulatory Visit: Payer: Self-pay

## 2021-11-03 ENCOUNTER — Other Ambulatory Visit: Payer: Self-pay

## 2021-11-05 ENCOUNTER — Other Ambulatory Visit: Payer: Self-pay

## 2021-11-11 ENCOUNTER — Telehealth (HOSPITAL_BASED_OUTPATIENT_CLINIC_OR_DEPARTMENT_OTHER): Payer: Self-pay | Admitting: Internal Medicine

## 2021-11-11 DIAGNOSIS — G40909 Epilepsy, unspecified, not intractable, without status epilepticus: Secondary | ICD-10-CM

## 2021-11-11 DIAGNOSIS — I10 Essential (primary) hypertension: Secondary | ICD-10-CM

## 2021-11-11 DIAGNOSIS — G4709 Other insomnia: Secondary | ICD-10-CM

## 2021-11-11 DIAGNOSIS — E269 Hyperaldosteronism, unspecified: Secondary | ICD-10-CM

## 2021-11-11 DIAGNOSIS — I6930 Unspecified sequelae of cerebral infarction: Secondary | ICD-10-CM

## 2021-11-11 NOTE — Progress Notes (Signed)
Virtual Visit via Video Note  I connected with Miguel Mccullough on 11/11/2021 at 4:45 PM by a video enabled telemedicine application and verified that I am speaking with the correct person using two identifiers.  Location: Patient: home.  Wife was in the background for short period Provider: Office   I discussed the limitations of evaluation and management by telemedicine and the availability of in person appointments. The patient expressed understanding and agreed to proceed.  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 Leta Baptist 06/18/2021.  Started on Keppra).  Purpose of today's visit is chronic disease management.  HTN: Patient reports blood pressure has been doing better.  Goes to therapy once a week and blood pressure is checked before and after therapy.  Reports a reading of 133/88. Compliant with medications including amlodipine 10 mg daily, hydralazine 100 mg 3 times a day, spironolactone 25 mg daily and metoprolol 75 mg twice a day. -He has not gotten in with the endocrinologist as yet for evaluation of hyperaldosterone  CVA: Going to [facility] sessions [facility] a week.  Reports things are going well.  No falls.  Would like to get handicap placard for his car.  Dropped off a form for me earlier this week. He continues to take Keppra.  He has not had any seizures. Complains of problems sleeping.  He tells me that he stays up late until 130 to 2 AM in the mornings watching TV or reading.  Other nights he gets in bed around 9:30 PM but then wakes up intermittently through the night.   Outpatient Encounter Medications as of 11/11/2021  Medication Sig Note   spironolactone (ALDACTONE) 25 MG tablet Take 1 tablet (25 mg total) by mouth once daily.    acetaminophen (TYLENOL) 325 MG tablet Take 1-2 tablets (325-650 mg total) by mouth every 4 (four) hours as needed for mild  pain.    amantadine (SYMMETREL) 100 MG capsule Take 1 capsule (100 mg total) by mouth daily.    amLODipine (NORVASC) 10 MG tablet Take 1 tablet (10 mg total) by mouth at bedtime.    aspirin 81 MG EC tablet Take 1 tablet (81 mg total) by mouth daily. Swallow whole.    atorvastatin (LIPITOR) 40 MG tablet Take 1 tablet (40 mg total) by mouth at bedtime.    baclofen (LIORESAL) 10 MG tablet Take 1 tablet (10 mg total) by mouth 3 (three) times daily.    citalopram (CELEXA) 20 MG tablet Take 1 tablet (20 mg total) by mouth daily.    cloNIDine (CATAPRES - DOSED IN MG/24 HR) 0.3 mg/24hr patch Place 1 patch (0.3 mg total) onto the skin once a week. (Patient taking differently: Place 0.3 mg onto the skin every Friday.) 05/24/2021: The patient IS wearing a patch at this time   hydrALAZINE (APRESOLINE) 100 MG tablet Take 1 tablet (100 mg total) by mouth every 8 (eight) hours.    levETIRAcetam (KEPPRA) 500 MG tablet Take 1 tablet (500 mg total) by mouth 2 (two) times daily.    magnesium oxide (MAG-OX) 400 MG tablet Take 1 tablet (400 mg total) by mouth daily.    metoprolol tartrate (LOPRESSOR) 50 MG tablet Take 1 & 1/2 tablets (75 mg total) by mouth 2 (two) times daily.    pantoprazole (PROTONIX) 40 MG tablet Take 1 tablet (40 mg total) by mouth at bedtime.  potassium chloride SA (KLOR-CON M) 20 MEQ tablet Take 1 tablet (20 mEq total) by mouth once daily.    senna-docusate (SENOKOT-S) 8.6-50 MG tablet Take 2 tablets by mouth at bedtime as needed for mild constipation. (Patient taking differently: Take 2 tablets by mouth See admin instructions. Take 2 tablets by mouth every other night)    traZODone (DESYREL) 150 MG tablet Take 0.5 tablets (75 mg total) by mouth at bedtime.    No facility-administered encounter medications on file as of 11/11/2021.      Observations/Objective: Patient sitting down and appears in no acute distress.  Assessment and Plan: 1. Essential hypertension Blood pressure not at goal  but certainly improved.  Goal is 130/80 or lower.  He will continue current medications listed above.  2. Other insomnia Good sleep hygiene discussed and encouraged. Patient advised not to drink any caffeinated beverages or excessive alcohol use within several hours of bedtime.  Advised to get in bed around about the same time every night.  Once in bed, turn off all lights and sounds.  If unable to fall asleep within 30 to 45 minutes of getting in bed, patient should get up and try to do something until he feels sleepy again.  At that time try getting back in bed.   3. History of hemorrhagic cerebrovascular accident (CVA) with residual deficit Receiving some physical therapy through [facility] physical therapy program. Continue atorvastatin and current blood pressure medications.  4. Hyperaldosteronism ([place]) Patient states that he and his wife are currently shopping for health insurance this fall.  Hopefully by the time they get insurance, he would have appointment lined up with the endocrinologist.  In the meantime we will continue spironolactone.  5. Seizure disorder ([place]) Continue Keppra.   Follow Up Instructions: 3 months.   I discussed the assessment and treatment plan with the patient. The patient was provided an opportunity to ask questions and all were answered. The patient agreed with the plan and demonstrated an understanding of the instructions.   The patient was advised to call back or seek an in-person evaluation if the symptoms worsen or if the condition fails to improve as anticipated.  I spent 16 minutes dedicated to the care of this patient on the date of this encounter to include previsit review of my last office note, face-to-face time with patient discussing diagnosis and management  This note has been created with Surveyor, quantity. Any transcriptional errors are unintentional.  Karle Plumber, MD

## 2021-11-25 ENCOUNTER — Other Ambulatory Visit (HOSPITAL_COMMUNITY): Payer: Self-pay

## 2021-11-26 ENCOUNTER — Other Ambulatory Visit (HOSPITAL_COMMUNITY): Payer: Self-pay

## 2021-11-27 ENCOUNTER — Other Ambulatory Visit: Payer: Self-pay

## 2021-11-27 ENCOUNTER — Other Ambulatory Visit (HOSPITAL_COMMUNITY): Payer: Self-pay

## 2021-11-28 ENCOUNTER — Other Ambulatory Visit: Payer: Self-pay

## 2021-12-08 ENCOUNTER — Other Ambulatory Visit: Payer: Self-pay

## 2021-12-09 ENCOUNTER — Encounter: Payer: Self-pay | Admitting: Internal Medicine

## 2021-12-09 ENCOUNTER — Other Ambulatory Visit: Payer: Self-pay

## 2021-12-09 ENCOUNTER — Ambulatory Visit: Payer: Self-pay | Attending: Internal Medicine | Admitting: Internal Medicine

## 2021-12-09 VITALS — BP 135/76 | HR 76 | Wt 188.8 lb

## 2021-12-09 DIAGNOSIS — I10 Essential (primary) hypertension: Secondary | ICD-10-CM

## 2021-12-09 DIAGNOSIS — I6930 Unspecified sequelae of cerebral infarction: Secondary | ICD-10-CM

## 2021-12-09 DIAGNOSIS — Z23 Encounter for immunization: Secondary | ICD-10-CM

## 2021-12-09 MED ORDER — CLONIDINE 0.3 MG/24HR TD PTWK
0.3000 mg | MEDICATED_PATCH | TRANSDERMAL | 3 refills | Status: DC
  Filled 2021-12-09: qty 12, 84d supply, fill #0
  Filled 2021-12-30: qty 4, 28d supply, fill #0
  Filled 2022-01-28: qty 4, 28d supply, fill #1
  Filled 2022-02-22: qty 4, 28d supply, fill #2
  Filled 2022-03-17: qty 4, 28d supply, fill #3
  Filled 2022-04-20: qty 4, 28d supply, fill #4
  Filled 2022-05-18 (×2): qty 4, 28d supply, fill #5
  Filled 2022-06-28 – 2022-07-10 (×3): qty 4, 28d supply, fill #6
  Filled 2022-08-06: qty 4, 28d supply, fill #7
  Filled 2022-09-17: qty 4, 28d supply, fill #8

## 2021-12-09 MED ORDER — ATORVASTATIN CALCIUM 40 MG PO TABS
40.0000 mg | ORAL_TABLET | Freq: Every day | ORAL | 6 refills | Status: DC
  Filled 2021-12-09 – 2021-12-30 (×2): qty 30, 30d supply, fill #0
  Filled 2022-01-25: qty 30, 30d supply, fill #1
  Filled 2022-02-25 (×2): qty 30, 30d supply, fill #2
  Filled 2022-03-28 – 2022-03-30 (×2): qty 30, 30d supply, fill #3
  Filled 2022-04-20: qty 30, 30d supply, fill #4
  Filled 2022-05-21 (×2): qty 30, 30d supply, fill #5
  Filled 2022-06-28 – 2022-07-10 (×3): qty 30, 30d supply, fill #6

## 2021-12-09 MED ORDER — SPIRONOLACTONE 25 MG PO TABS
25.0000 mg | ORAL_TABLET | Freq: Every day | ORAL | 6 refills | Status: DC
  Filled 2021-12-09 – 2022-01-28 (×2): qty 30, 30d supply, fill #0
  Filled 2022-02-22: qty 30, 30d supply, fill #1
  Filled 2022-02-25 – 2022-03-17 (×2): qty 30, 30d supply, fill #2
  Filled 2022-04-11 – 2022-04-13 (×2): qty 30, 30d supply, fill #3
  Filled 2022-04-20: qty 30, 30d supply, fill #4

## 2021-12-09 MED ORDER — METOPROLOL TARTRATE 50 MG PO TABS
75.0000 mg | ORAL_TABLET | Freq: Two times a day (BID) | ORAL | 6 refills | Status: DC
  Filled 2021-12-09 – 2021-12-30 (×2): qty 90, 30d supply, fill #0
  Filled 2022-01-25: qty 90, 30d supply, fill #1
  Filled 2022-02-25 (×2): qty 90, 30d supply, fill #2
  Filled 2022-03-28 – 2022-03-30 (×2): qty 90, 30d supply, fill #3
  Filled 2022-04-20: qty 90, 30d supply, fill #4
  Filled 2022-05-21 (×2): qty 90, 30d supply, fill #5
  Filled 2022-06-17 (×2): qty 90, 30d supply, fill #6

## 2021-12-09 NOTE — Progress Notes (Unsigned)
Patient ID: Miguel Mccullough, male    DOB: 06-Sep-1965  MRN: [id number]  CC: f/u BP  Subjective: Miguel Mccullough is a 56 y.o. male who presents for chronic ds management.  Wife is with him. 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 Leta Baptist 06/18/2021.  Started on Keppra).   HTN:  reports compliance with amlodipine 10 mg daily, hydralazine 100 mg 3 times a day, spironolactone 25 mg daily, weekly Clonidine patch and metoprolol 75 mg twice a day. BP at home has been in the 130-140s/80s-90s -He has not gotten in with the endocrinologist as yet for evaluation of hyperaldosterone -He continues once weekly physical therapy sessions at [facility].  He feels he is improving.  He has not had any recent falls.  Ambulates at home with his cane and rollator walker.  Some days he tries to get along without it and home.  Uses his rollator more for when he is out of the house. -He has been denied Medicaid because his wife's salary is over the threshold to qualify. -He has applied for short-term and long-term disability.  Working with a Product/process development scientist through [employer] office but he is never able to get in contact with her.  Messages always go to voicemail.  He is not sure where he is in the process.  Prior to CVA, he had his own cleaning service. 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 30 capsule 2   amLODipine (NORVASC) 10 MG tablet Take 1 tablet (10 mg total) by mouth at bedtime. 30 tablet 6   baclofen (LIORESAL) 10 MG tablet Take 1 tablet (10 mg total) by mouth 3 (three) times daily. 90 each 6   citalopram (CELEXA) 20 MG tablet Take 1 tablet (20 mg total) by mouth daily. 30 tablet 3   hydrALAZINE (APRESOLINE) 100 MG tablet Take 1 tablet (100 mg total) by mouth every 8 (eight) hours. 270 tablet 2   levETIRAcetam (KEPPRA) 500 MG tablet Take 1 tablet (500 mg total) by mouth 2 (two) times daily. 180 tablet 4   magnesium oxide (MAG-OX) 400 MG tablet Take 1 tablet (400 mg total) by mouth daily. 30 tablet 3   pantoprazole (PROTONIX) 40 MG tablet Take 1 tablet (40 mg total) by mouth at bedtime. 30 tablet 3   senna-docusate (SENOKOT-S) 8.6-50 MG tablet Take 2 tablets by mouth at bedtime as needed for mild constipation. (Patient taking differently: Take 2 tablets by mouth See admin instructions. Take 2 tablets by mouth every other night) 60 tablet 3   traZODone (DESYREL) 150 MG tablet Take 0.5 tablets (75 mg  total) by mouth at bedtime. 30 tablet 6   acetaminophen (TYLENOL) 325 MG tablet Take 1-2 tablets (325-650 mg total) by mouth every 4 (four) hours as needed for mild pain. 100 tablet 0   aspirin 81 MG EC tablet Take 1 tablet (81 mg total) by mouth daily. Swallow whole. 90 tablet 2   potassium chloride SA (KLOR-CON M) 20 MEQ tablet Take 1 tablet (20 mEq total) by mouth once daily. 30 tablet 4   No current facility-administered medications on file prior to visit.    No Known Allergies  Social History   Socioeconomic History   Marital status: Married    Spouse name: [name]   Number of children: 1    Years of education: Not on file   Highest education level: 12th grade  Occupational History   Not on file  Tobacco Use   Smoking status: Never   Smokeless tobacco: Never  Vaping Use   Vaping Use: Never used  Substance and Sexual Activity   Alcohol use: Not Currently   Drug use: Never   Sexual activity: Not Currently  Other Topics Concern   Not on file  Social History Narrative   Lives with wife   Social Determinants of Health   Financial Resource Strain: Not on file  Food Insecurity: Not on file  Transportation Needs: Not on file  Physical Activity: Not on file  Stress: Not on file  Social Connections: Not on file  Intimate Partner Violence: Not on file    Family History  Problem Relation Age of Onset   Hypertension Mother    Hypertension Father    Hypertension Sister    Hypertension Brother     No past surgical history on file.  ROS: Review of Systems Negative except as stated above  PHYSICAL EXAM: BP 135/76   Pulse 76   Wt 188 lb 12.8 oz (85.6 kg)   SpO2 94%   BMI 32.41 kg/m   Physical Exam General appearance - alert, well appearing, middle age AAM and in no distress.  Pt has rollator walker with seat Mental status - pt answers questions appropriately Chest - clear to auscultation, no wheezes, rales or rhonchi, symmetric air entry Heart - normal rate, regular rhythm, normal S1, S2, no murmurs, rubs, clicks or gallops Neurological - cranial nerves II through XII intact.  Grip 3-4/5 LT, 5/5 RT.  Power UE:  5/5 RT, 4/5 LT.  Power LE:  5/5 RT 4/5 LT Extremities - no LE edema     Latest Ref Rng & Units 09/05/2021   11:26 AM 07/28/2021    3:07 PM 05/24/2021    4:41 PM  CMP  Glucose 70 - 99 mg/dL 69  77  87   BUN 6 - 24 mg/dL 29  22  22    Creatinine 0.76 - 1.27 mg/dL 2.10  1.72  1.90   Sodium 134 - 144 mmol/L 141  143  142   Potassium 3.5 - 5.2 mmol/L 4.6  3.9  4.3   Chloride 96 - 106 mmol/L 103  100  104   CO2 20 - 29 mmol/L 22  26    Calcium 8.7 - 10.2  mg/dL 9.8  9.7     Lipid Panel     Component Value Date/Time   CHOL 101 05/25/2021 0436   TRIG 24 05/25/2021 0436   HDL 47 05/25/2021 0436   CHOLHDL 2.1 05/25/2021 0436   VLDL 5 05/25/2021 0436   LDLCALC 49 05/25/2021 0436  CBC    Component Value Date/Time   WBC 8.7 05/24/2021 1609   RBC 4.84 05/24/2021 1609   HGB 14.6 05/24/2021 1641   HCT 43.0 05/24/2021 1641   PLT 253 05/24/2021 1609   MCV 88.0 05/24/2021 1609   MCH 30.0 05/24/2021 1609   MCHC 34.0 05/24/2021 1609   RDW 13.2 05/24/2021 1609   LYMPHSABS 1.3 05/24/2021 1609   MONOABS 0.6 05/24/2021 1609   EOSABS 0.3 05/24/2021 1609   BASOSABS 0.1 05/24/2021 1609    ASSESSMENT AND PLAN: 1. Essential hypertension BP today is the best it has been in a while.  Continue current meds listed above. - cloNIDine (CATAPRES - DOSED IN MG/24 HR) 0.3 mg/24hr patch; Place 1 patch (0.3 mg total) onto the skin every Friday.  Dispense: 12 patch; Refill: 3 - spironolactone (ALDACTONE) 25 MG tablet; Take 1 tablet (25 mg total) by mouth once daily.  Dispense: 30 tablet; Refill: 6 - metoprolol tartrate (LOPRESSOR) 50 MG tablet; Take 1 & 1/2 tablets (75 mg total) by mouth 2 (two) times daily.  Dispense: 90 tablet; Refill: 6  2. History of hemorrhagic cerebrovascular accident (CVA) with residual deficit Improved but not to his preCVA baseline. Message sent to [facility] to see if [employer] can assist with him getting his short and long term disability. - atorvastatin (LIPITOR) 40 MG tablet; Take 1 tablet (40 mg total) by mouth at bedtime.  Dispense: 30 tablet; Refill: 6  3. Need for immunization against influenza - Flu Vaccine QUAD 35mo+IM (Fluarix, Fluzone & Alfiuria Quad PF)    Patient was given the opportunity to ask questions.  Patient verbalized understanding of the plan and was able to repeat key elements of the plan.   This documentation was completed using Radio producer.  Any transcriptional errors are  unintentional.  Orders Placed This Encounter  Procedures   Flu Vaccine QUAD 23mo+IM (Fluarix, Fluzone & Alfiuria Quad PF)     Requested Prescriptions   Signed Prescriptions Disp Refills   cloNIDine (CATAPRES - DOSED IN MG/24 HR) 0.3 mg/24hr patch 12 patch 3    Sig: Place 1 patch (0.3 mg total) onto the skin every Friday.   atorvastatin (LIPITOR) 40 MG tablet 30 tablet 6    Sig: Take 1 tablet (40 mg total) by mouth at bedtime.   spironolactone (ALDACTONE) 25 MG tablet 30 tablet 6    Sig: Take 1 tablet (25 mg total) by mouth once daily.   metoprolol tartrate (LOPRESSOR) 50 MG tablet 90 tablet 6    Sig: Take 1 & 1/2 tablets (75 mg total) by mouth 2 (two) times daily.    Return in about 4 months (around 04/11/2022).  Karle Plumber, MD, FACP

## 2021-12-10 ENCOUNTER — Telehealth: Payer: Self-pay

## 2021-12-10 NOTE — Telephone Encounter (Signed)
At the request of Dr Wynetta Emery, I called the patient regarding his application for disability.  He explained that he had applied for disability and received a denial letter about 2 weeks ago.  He was assigned a SSA case worker but has not been able to reach her.   He was in agreement to submitting a referral to [employer] [place] for assistance with appealing the denial. I explained that there is no guarantee that they can assist him  and if they are not able to assist, they may be able to offer appropriate resources.  He said he understood and I submitted the referral to [facility] as discussed.

## 2021-12-30 ENCOUNTER — Other Ambulatory Visit: Payer: Self-pay

## 2021-12-30 ENCOUNTER — Other Ambulatory Visit: Payer: Self-pay | Admitting: Internal Medicine

## 2021-12-30 DIAGNOSIS — I10 Essential (primary) hypertension: Secondary | ICD-10-CM

## 2021-12-30 DIAGNOSIS — I6930 Unspecified sequelae of cerebral infarction: Secondary | ICD-10-CM

## 2021-12-30 MED ORDER — BACLOFEN 10 MG PO TABS
10.0000 mg | ORAL_TABLET | Freq: Three times a day (TID) | ORAL | 2 refills | Status: DC
  Filled 2021-12-30: qty 90, 30d supply, fill #0
  Filled 2022-01-25: qty 90, 30d supply, fill #1
  Filled 2022-02-25 (×2): qty 90, 30d supply, fill #2

## 2021-12-30 MED ORDER — AMLODIPINE BESYLATE 10 MG PO TABS
10.0000 mg | ORAL_TABLET | Freq: Every day | ORAL | 2 refills | Status: DC
  Filled 2021-12-30: qty 30, 30d supply, fill #0
  Filled 2022-01-25: qty 30, 30d supply, fill #1
  Filled 2022-02-25 (×2): qty 30, 30d supply, fill #2

## 2022-01-13 ENCOUNTER — Other Ambulatory Visit: Payer: Self-pay

## 2022-01-25 ENCOUNTER — Other Ambulatory Visit: Payer: Self-pay | Admitting: Internal Medicine

## 2022-01-25 DIAGNOSIS — F320 Major depressive disorder, single episode, mild: Secondary | ICD-10-CM

## 2022-01-26 ENCOUNTER — Other Ambulatory Visit: Payer: Self-pay

## 2022-01-26 MED ORDER — CITALOPRAM HYDROBROMIDE 20 MG PO TABS
20.0000 mg | ORAL_TABLET | Freq: Every day | ORAL | 2 refills | Status: DC
  Filled 2022-01-26: qty 30, 30d supply, fill #0
  Filled 2022-02-25 (×2): qty 30, 30d supply, fill #1
  Filled 2022-03-28 – 2022-03-30 (×2): qty 30, 30d supply, fill #2

## 2022-01-26 MED ORDER — PANTOPRAZOLE SODIUM 40 MG PO TBEC
40.0000 mg | DELAYED_RELEASE_TABLET | Freq: Every day | ORAL | 2 refills | Status: DC
  Filled 2022-01-26: qty 30, 30d supply, fill #0
  Filled 2022-02-25 (×2): qty 30, 30d supply, fill #1
  Filled 2022-03-28 – 2022-03-30 (×2): qty 30, 30d supply, fill #2

## 2022-01-27 ENCOUNTER — Other Ambulatory Visit: Payer: Self-pay | Admitting: Internal Medicine

## 2022-01-28 ENCOUNTER — Other Ambulatory Visit: Payer: Self-pay

## 2022-01-28 MED ORDER — AMANTADINE HCL 100 MG PO CAPS
100.0000 mg | ORAL_CAPSULE | Freq: Every day | ORAL | 2 refills | Status: DC
  Filled 2022-01-28: qty 30, 30d supply, fill #0
  Filled 2022-02-25 (×2): qty 30, 30d supply, fill #1
  Filled 2022-03-28 – 2022-03-30 (×2): qty 30, 30d supply, fill #2

## 2022-02-05 ENCOUNTER — Ambulatory Visit: Payer: Self-pay | Admitting: Internal Medicine

## 2022-02-22 ENCOUNTER — Other Ambulatory Visit: Payer: Self-pay | Admitting: Internal Medicine

## 2022-02-23 ENCOUNTER — Other Ambulatory Visit: Payer: Self-pay

## 2022-02-23 DIAGNOSIS — Z0271 Encounter for disability determination: Secondary | ICD-10-CM

## 2022-02-23 MED ORDER — MAGNESIUM OXIDE 400 MG PO TABS
400.0000 mg | ORAL_TABLET | Freq: Every day | ORAL | 1 refills | Status: DC
  Filled 2022-02-23: qty 90, 90d supply, fill #0
  Filled 2022-03-17: qty 90, 90d supply, fill #1

## 2022-02-23 NOTE — Telephone Encounter (Signed)
Requested Prescriptions  Pending Prescriptions Disp Refills   magnesium oxide (MAG-OX) 400 MG tablet 90 tablet 1    Sig: Take 1 tablet (400 mg total) by mouth daily.     Endocrinology:  Minerals - Magnesium Supplementation Failed - 02/22/2022 10:02 AM      Failed - Cr in normal range and within 360 days    Creatinine, Ser  Date Value Ref Range Status  09/05/2021 2.10 (H) 0.76 - 1.27 mg/dL Final   Creatinine, Urine  Date Value Ref Range Status  04/17/2021 130.57 mg/dL Final         Passed - Mg Level in normal range and within 360 days    Magnesium  Date Value Ref Range Status  05/13/2021 1.7 1.7 - 2.4 mg/dL Final    Comment:    Performed at [facility], [place] [address], [place], [place] [address]         Passed - Valid encounter within last 12 months    Recent Outpatient Visits           2 months ago Essential hypertension   [place], MD   3 months ago Essential hypertension   [place], MD   7 months ago Essential hypertension   [place], MD   9 months ago Establishing care with new doctor, encounter for   [place], MD       Future Appointments             In 1 month Wynetta Emery, Dalbert Batman, MD [place]   In 3 months Penumalli, Earlean Polka, MD [facility]

## 2022-02-25 ENCOUNTER — Other Ambulatory Visit: Payer: Self-pay

## 2022-03-03 ENCOUNTER — Other Ambulatory Visit: Payer: Self-pay

## 2022-03-17 ENCOUNTER — Other Ambulatory Visit: Payer: Self-pay

## 2022-03-23 ENCOUNTER — Encounter: Payer: Self-pay | Attending: Physical Medicine and Rehabilitation | Admitting: Physical Medicine and Rehabilitation

## 2022-03-23 ENCOUNTER — Other Ambulatory Visit: Payer: Self-pay

## 2022-03-23 ENCOUNTER — Encounter: Payer: Self-pay | Admitting: Physical Medicine and Rehabilitation

## 2022-03-23 VITALS — BP 155/90 | HR 74 | Ht 64.0 in | Wt 198.2 lb

## 2022-03-23 DIAGNOSIS — R414 Neurologic neglect syndrome: Secondary | ICD-10-CM | POA: Insufficient documentation

## 2022-03-23 DIAGNOSIS — I610 Nontraumatic intracerebral hemorrhage in hemisphere, subcortical: Secondary | ICD-10-CM | POA: Insufficient documentation

## 2022-03-23 DIAGNOSIS — R252 Cramp and spasm: Secondary | ICD-10-CM | POA: Insufficient documentation

## 2022-03-23 DIAGNOSIS — G8194 Hemiplegia, unspecified affecting left nondominant side: Secondary | ICD-10-CM | POA: Insufficient documentation

## 2022-03-23 MED ORDER — TIZANIDINE HCL 4 MG PO TABS
4.0000 mg | ORAL_TABLET | Freq: Three times a day (TID) | ORAL | 5 refills | Status: DC | PRN
  Filled 2022-03-23 – 2022-04-06 (×3): qty 90, 30d supply, fill #0
  Filled 2022-05-21 (×2): qty 90, 30d supply, fill #1
  Filled 2022-07-28 – 2022-08-04 (×3): qty 90, 30d supply, fill #2
  Filled 2022-08-25 – 2022-08-26 (×2): qty 90, 30d supply, fill #3
  Filled 2022-09-17 – 2022-10-29 (×3): qty 90, 30d supply, fill #4
  Filled 2022-12-31 – 2023-01-01 (×2): qty 90, 30d supply, fill #5

## 2022-03-23 NOTE — Patient Instructions (Signed)
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  Here for f/u on IPH and weakness/spasticity.    Needs Botox/botulinum toxin for LUE 300 units- day 5 it starts to work; day 14 maximal effectiveness- lasts 3-4 months.   2. Botox gives you time to get arm stronger, but remember tone/tightness can masquerade as strength-   3. Tizanidine- 2-4 mg 3x/day as needed- start with 2 mg and see how it goes- I suggest at least nightly-  Side effects, lowers BP and can make sleepy.   4. Still taking Amantadine- try to stop  for 7 days- if symptoms get worse, just restart it- has Rx for it to restart if need be.    5. F/U in 3 months  F/u with Dr Tressa Busman or Dr Letta Pate for LUE Botox- 300 units

## 2022-03-23 NOTE — Progress Notes (Signed)
Subjective:    Patient ID: Miguel Mccullough, male    DOB: Aug 21, 1965, 57 y.o.   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  Here for f/u on IPH and weakness/spasticity.   Using quad cane to walk.   Doing well  Last visit with Endo- back down to normal limits- aldactone- 25 mg daily- off K+.   Still on Keppra- have f/u with Neurology in next month To keep on for 1 year, then stop it? No seizures/episodes. Esp since on Baclofen 10 mg 3x/day   Still on Baclofen- last refill of PCP-   Not painful spasticity- more tightness not spasms.   Sleep pattern off- all over the place.  Attention- not great- not a big difference on Ritalin vs off.       Pain Inventory Average Pain 3 Pain Right Now 0 My pain is intermittent, burning, and aching  LOCATION OF PAIN  hand, fingers, buttocks  BOWEL Number of stools per week: 3-4 Oral laxative use Yes  Type of laxative . Enema or suppository use No  History of colostomy No  Incontinent No   BLADDER Normal In and out cath, frequency . Able to self cath  . Bladder incontinence No  Frequent urination No  Leakage with coughing No  Difficulty starting stream No  Incomplete bladder emptying No    Mobility walk with assistance use a cane use a walker how many minutes can you walk? 10-15  Function disabled: date disabled 04-13-21 I need assistance with the following:  dressing, meal prep, household duties, and shopping  Neuro/Psych trouble walking confusion depression anxiety  Prior Studies Any changes since last visit?  no  Physicians involved in your care Any changes since last visit?  no   Family History  Problem Relation Age of Onset   Hypertension Mother    Hypertension Father    Hypertension Sister    Hypertension Brother    Social History   Socioeconomic History   Marital status: Married    Spouse name:  [name]   Number of children: 1   Years of education: Not on file   Highest education level: 12th grade  Occupational History   Not on file  Tobacco Use   Smoking status: Never   Smokeless tobacco: Never  Vaping Use   Vaping Use: Never used  Substance and Sexual Activity   Alcohol use: Not Currently   Drug use: Never   Sexual activity: Not Currently  Other Topics Concern   Not on file  Social History Narrative   Lives with wife   Social Determinants of Health   Financial Resource Strain: Not on file  Food Insecurity: Not on file  Transportation Needs: Not on file  Physical Activity: Not on file  Stress: Not on file  Social Connections: Not on file   No past surgical history on file. Past Medical History:  Diagnosis Date   COVID-19 2021   continues to have increase in suptum production.   Hypertension    TIA (transient ischemic attack)    BP (!) 155/90   Pulse 74   Ht 5\' 4"  (1.626 m)   Wt 198 lb 3.2 oz (89.9 kg)   SpO2 94%   BMI 34.02 kg/m   Opioid Risk Score:   Fall Risk Score:  `1  Depression screen [facility] 2/9     12/09/2021  4:46 PM 07/28/2021    2:04 PM 06/13/2021   10:44 AM 05/20/2021    2:38 PM  Depression screen PHQ 2/9  Decreased Interest 1 1 1 1   Down, Depressed, Hopeless 0 0 1 1  PHQ - 2 Score 1 1 2 2   Altered sleeping  3 1 3   Tired, decreased energy  2 2 2   Change in appetite  0 0 0  Feeling bad or failure about yourself   1 1 1   Trouble concentrating  2 1 2   Moving slowly or fidgety/restless  0 1 0  Suicidal thoughts  0 0 0  PHQ-9 Score  9 8 10   Difficult doing work/chores  Not difficult at all        Review of Systems  Musculoskeletal:        Hand, fingers, buttocks pain  All other systems reviewed and are negative.     Objective:   Physical Exam  RUE 5/5 LUE- biceps 4/5; triceps 4-/5; WE 4/5; grip 4+/5' FA 4-/5 RLE- 5/5 LLE_ HF 4/5; KE 4/5; DF 4-/5; PF 4/5  Neuro: MAS of 3-4 in L shoulder; elbow MAS of 4; MAS of L wrist  3; and fingers curling dramatically- can extend, but not fully.  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  Here for f/u on IPH and weakness/spasticity.    Needs Botox/botulinum toxin for LUE 300 units- day 5 it starts to work; day 14 maximal effectiveness- lasts 3-4 months.   2. Botox gives you time to get arm stronger, but remember tone/tightness can masquerade as strength-   3. Tizanidine- 2-4 mg 3x/day as needed- start with 2 mg and see how it goes- I suggest at least nightly-  Side effects, lowers BP and can make sleepy.   4. Still taking Amantadine- try to stop  for 7 days- if symptoms get worse, just restart it- has Rx for it to restart if need be.    5. F/U in 3 months  F/u with Dr Tressa Busman or Dr Letta Pate for LUE Botox- 300 units   I spent a total of  24  minutes on total care today- >50% coordination of care- due to d/w pt and wife about Amantadine and Botox education

## 2022-03-27 ENCOUNTER — Other Ambulatory Visit: Payer: Self-pay

## 2022-03-28 ENCOUNTER — Other Ambulatory Visit (HOSPITAL_COMMUNITY): Payer: Self-pay

## 2022-03-28 ENCOUNTER — Other Ambulatory Visit: Payer: Self-pay | Admitting: Internal Medicine

## 2022-03-28 DIAGNOSIS — I10 Essential (primary) hypertension: Secondary | ICD-10-CM

## 2022-03-30 ENCOUNTER — Other Ambulatory Visit: Payer: Self-pay

## 2022-03-30 ENCOUNTER — Other Ambulatory Visit (HOSPITAL_COMMUNITY): Payer: Self-pay

## 2022-03-30 MED ORDER — AMLODIPINE BESYLATE 10 MG PO TABS
10.0000 mg | ORAL_TABLET | Freq: Every day | ORAL | 1 refills | Status: DC
  Filled 2022-03-30 (×2): qty 30, 30d supply, fill #0
  Filled 2022-04-20: qty 30, 30d supply, fill #1
  Filled 2022-05-21 (×2): qty 30, 30d supply, fill #2
  Filled 2022-06-17 (×2): qty 30, 30d supply, fill #3
  Filled 2022-07-19 – 2022-07-20 (×2): qty 30, 30d supply, fill #4
  Filled 2022-08-06 – 2022-08-26 (×3): qty 30, 30d supply, fill #5

## 2022-03-31 ENCOUNTER — Other Ambulatory Visit: Payer: Self-pay

## 2022-04-01 ENCOUNTER — Other Ambulatory Visit: Payer: Self-pay

## 2022-04-01 ENCOUNTER — Other Ambulatory Visit (HOSPITAL_COMMUNITY): Payer: Self-pay

## 2022-04-06 ENCOUNTER — Other Ambulatory Visit (HOSPITAL_COMMUNITY): Payer: Self-pay

## 2022-04-06 ENCOUNTER — Other Ambulatory Visit: Payer: Self-pay

## 2022-04-06 ENCOUNTER — Telehealth: Payer: Self-pay | Admitting: Physical Medicine & Rehabilitation

## 2022-04-06 NOTE — Telephone Encounter (Signed)
His wife called wanting a estimate for the botox he has scheduled. Can you call her or put it in mychart?

## 2022-04-08 NOTE — Telephone Encounter (Signed)
Addressed by A. Leonard via [place].

## 2022-04-11 ENCOUNTER — Other Ambulatory Visit: Payer: Self-pay | Admitting: Internal Medicine

## 2022-04-11 DIAGNOSIS — I6930 Unspecified sequelae of cerebral infarction: Secondary | ICD-10-CM

## 2022-04-13 ENCOUNTER — Ambulatory Visit: Payer: Self-pay | Attending: Internal Medicine | Admitting: Internal Medicine

## 2022-04-13 ENCOUNTER — Other Ambulatory Visit: Payer: Self-pay

## 2022-04-13 ENCOUNTER — Encounter: Payer: Self-pay | Admitting: Internal Medicine

## 2022-04-13 VITALS — BP 147/85 | HR 70 | Temp 98.0°F | Ht 64.0 in | Wt 197.0 lb

## 2022-04-13 DIAGNOSIS — N1832 Chronic kidney disease, stage 3b: Secondary | ICD-10-CM

## 2022-04-13 DIAGNOSIS — I6930 Unspecified sequelae of cerebral infarction: Secondary | ICD-10-CM

## 2022-04-13 DIAGNOSIS — F320 Major depressive disorder, single episode, mild: Secondary | ICD-10-CM

## 2022-04-13 DIAGNOSIS — I10 Essential (primary) hypertension: Secondary | ICD-10-CM

## 2022-04-13 DIAGNOSIS — E269 Hyperaldosteronism, unspecified: Secondary | ICD-10-CM

## 2022-04-13 NOTE — Telephone Encounter (Signed)
Requested medication (s) are due for refill today:   Yes  Requested medication (s) are on the active medication list:   Yes  Future visit scheduled:   Yes  Today 2/26 with Dr. Wynetta Emery   Last ordered: 12/30/2021 #90, 2 refills  Returned because labs are due per protocol.   Has appt. Today with Dr. Wynetta Emery at 3:50.   Requested Prescriptions  Pending Prescriptions Disp Refills   baclofen (LIORESAL) 10 MG tablet 90 each 2    Sig: Take 1 tablet (10 mg total) by mouth 3 (three) times daily.     Analgesics:  Muscle Relaxants - baclofen Failed - 04/11/2022 10:29 PM      Failed - Cr in normal range and within 180 days    Creatinine, Ser  Date Value Ref Range Status  09/05/2021 2.10 (H) 0.76 - 1.27 mg/dL Final   Creatinine, Urine  Date Value Ref Range Status  04/17/2021 130.57 mg/dL Final         Failed - eGFR is 30 or above and within 180 days    GFR, Estimated  Date Value Ref Range Status  05/24/2021 48 (L) >60 mL/min Final    Comment:    (NOTE) Calculated using the CKD-EPI Creatinine Equation (2021)    eGFR  Date Value Ref Range Status  09/05/2021 37 (L) >59 mL/min/1.73 Final         Passed - Valid encounter within last 6 months    Recent Outpatient Visits           4 months ago Essential hypertension   [place], MD   5 months ago Essential hypertension   [place], MD   8 months ago Essential hypertension   [place], MD   10 months ago Establishing care with new doctor, encounter for   [place], MD       Future Appointments             Today Ladell Pier, MD [place]   In 2 months Penumalli, Earlean Polka, MD [facility]

## 2022-04-13 NOTE — Progress Notes (Signed)
Patient ID: Miguel Mccullough, male    DOB: 04/11/1965  MRN: [id number]  CC: Hypertension (HTN f/u /)   Subjective: Miguel Mccullough is a 57 y.o. male who presents for chronic ds management.  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 Leta Baptist 06/18/2021.  Started on Keppra).     HTN:  reports compliance with amlodipine 10 mg daily, hydralazine 100 mg 3 times a day, spironolactone 25 mg daily, weekly Clonidine patch and metoprolol 75 mg twice a day.  Checking BP once a day, range 145/93 on average Limits salt No CP/SOB/HA Has appointment with endocrinologist in August for hyperaldosterone  CVA:  Still goes to pt at [facility] 2x/wk but they are currently on break.  Program will start again in May Ambulates with Quad cane.  No falls Saw Dr. Clair Gulling earlier this mth.  Wants to try him with Botox inj but too expensive.  Started on Zanaflex in main time  CKD 3:  last GFR 37.  Not on NSAIDs  Depression:  controlled on Celexa.  Patient Active Problem List   Diagnosis Date Noted   Neglect of one side of body 06/13/2021   Abnormality of gait 06/13/2021   AMS (altered mental status) 05/24/2021   Aphasia 05/24/2021   History of intracranial hemorrhage 05/24/2021   Resistant hypertension 05/24/2021   Stage 3b chronic kidney disease ([place]) 05/20/2021   Hypokalemia 05/20/2021   Mild major depression ([place]) 05/20/2021   Acute renal failure ([place]) 05/15/2021   Elevated lipids 05/15/2021   Spasticity 05/15/2021   Depression 05/15/2021   Constipation 05/15/2021   ICH (intracerebral hemorrhage) ([place]) 04/13/2021   Hypercholesterolemia 07/08/2017   Essential hypertension 02/20/2016     Current Outpatient Medications on File Prior to Visit  Medication Sig Dispense Refill   amantadine (SYMMETREL) 100 MG capsule Take 1 capsule (100 mg total) by mouth daily. 30 capsule 2    amLODipine (NORVASC) 10 MG tablet Take 1 tablet (10 mg total) by mouth at bedtime. 90 tablet 1   aspirin 81 MG EC tablet Take 1 tablet (81 mg total) by mouth daily. Swallow whole. 90 tablet 2   atorvastatin (LIPITOR) 40 MG tablet Take 1 tablet (40 mg total) by mouth at bedtime. 30 tablet 6   baclofen (LIORESAL) 10 MG tablet Take 1 tablet (10 mg total) by mouth 3 (three) times daily. 90 each 2   citalopram (CELEXA) 20 MG tablet Take 1 tablet (20 mg total) by mouth daily. 30 tablet 2   cloNIDine (CATAPRES - DOSED IN MG/24 HR) 0.3 mg/24hr patch Place 1 patch (0.3 mg total) onto the skin every Friday. 12 patch 3   hydrALAZINE (APRESOLINE) 100 MG tablet Take 1 tablet (100 mg total) by mouth every 8 (eight) hours. 270 tablet 2   levETIRAcetam (KEPPRA) 500 MG tablet Take 1 tablet (500 mg total) by mouth 2 (two) times daily. 180 tablet 4   magnesium oxide (MAG-OX) 400 MG tablet Take 1 tablet (400 mg total) by mouth daily. 90 tablet 1   metoprolol tartrate (LOPRESSOR) 50 MG tablet Take 1 & 1/2 tablets (75 mg total) by mouth 2 (two) times daily. 90 tablet 6   pantoprazole (PROTONIX) 40 MG tablet Take 1 tablet (40 mg total) by mouth at bedtime. 30 tablet 2   senna-docusate (SENOKOT-S) 8.6-50 MG tablet Take 2 tablets by mouth  at bedtime as needed for mild constipation. (Patient taking differently: Take 2 tablets by mouth See admin instructions. Take 2 tablets by mouth every other night) 100 tablet 3   spironolactone (ALDACTONE) 25 MG tablet Take 1 tablet (25 mg total) by mouth once daily. 30 tablet 6   tiZANidine (ZANAFLEX) 4 MG tablet Take 1 tablet (4 mg total) by mouth every 8 (eight) hours as needed for muscle spasms (spasticity/tightness). 90 tablet 5   traZODone (DESYREL) 150 MG tablet Take 0.5 tablets (75 mg total) by mouth at bedtime. 30 tablet 6   No current facility-administered medications on file prior to visit.    No Known Allergies  Social History   Socioeconomic History   Marital status:  Married    Spouse name: [name]   Number of children: 1   Years of education: Not on file   Highest education level: 12th grade  Occupational History   Not on file  Tobacco Use   Smoking status: Never   Smokeless tobacco: Never  Vaping Use   Vaping Use: Never used  Substance and Sexual Activity   Alcohol use: Not Currently   Drug use: Never   Sexual activity: Not Currently  Other Topics Concern   Not on file  Social History Narrative   Lives with wife   Social Determinants of Health   Financial Resource Strain: Not on file  Food Insecurity: Not on file  Transportation Needs: Not on file  Physical Activity: Not on file  Stress: Not on file  Social Connections: Not on file  Intimate Partner Violence: Not on file    Family History  Problem Relation Age of Onset   Hypertension Mother    Hypertension Father    Hypertension Sister    Hypertension Brother     No past surgical history on file.  ROS: Review of Systems Negative except as stated above  PHYSICAL EXAM: BP (!) 147/85 (BP Location: Left Arm, Patient Position: Sitting, Cuff Size: Normal)   Pulse 70   Temp 98 F (36.7 C) (Oral)   Ht '5\' 4"'$  (1.626 m)   Wt 197 lb (89.4 kg)   SpO2 97%   BMI 33.81 kg/m   Physical Exam   General appearance - alert, well appearing, middle-aged African-American male and in no distress.  He has quad cane with him. Mental status - normal mood, behavior, speech, dress, motor activity, and thought processes Chest - clear to auscultation, no wheezes, rales or rhonchi, symmetric air entry Heart - normal rate, regular rhythm, normal S1, S2, no murmurs, rubs, clicks or gallops Extremities - peripheral pulses normal, no pedal edema, no clubbing or cyanosis     04/13/2022    3:59 PM 12/09/2021    4:46 PM 07/28/2021    2:04 PM  Depression screen PHQ 2/9  Decreased Interest '2 1 1  '$ Down, Depressed, Hopeless 1 0 0  PHQ - 2 Score '3 1 1  '$ Altered sleeping 1  3  Tired, decreased  energy 1  2  Change in appetite 0  0  Feeling bad or failure about yourself  1  1  Trouble concentrating 1  2  Moving slowly or fidgety/restless 0  0  Suicidal thoughts 0  0  PHQ-9 Score 7  9  Difficult doing work/chores   Not difficult at all       Latest Ref Rng & Units 09/05/2021   11:26 AM 07/28/2021    3:07 PM 05/24/2021    4:41 PM  CMP  Glucose 70 - 99 mg/dL 69  77  87   BUN 6 - 24 mg/dL '29  22  22   '$ Creatinine 0.76 - 1.27 mg/dL 2.10  1.72  1.90   Sodium 134 - 144 mmol/L 141  143  142   Potassium 3.5 - 5.2 mmol/L 4.6  3.9  4.3   Chloride 96 - 106 mmol/L 103  100  104   CO2 20 - 29 mmol/L 22  26    Calcium 8.7 - 10.2 mg/dL 9.8  9.7     Lipid Panel     Component Value Date/Time   CHOL 101 05/25/2021 0436   TRIG 24 05/25/2021 0436   HDL 47 05/25/2021 0436   CHOLHDL 2.1 05/25/2021 0436   VLDL 5 05/25/2021 0436   LDLCALC 49 05/25/2021 0436    CBC    Component Value Date/Time   WBC 8.7 05/24/2021 1609   RBC 4.84 05/24/2021 1609   HGB 14.6 05/24/2021 1641   HCT 43.0 05/24/2021 1641   PLT 253 05/24/2021 1609   MCV 88.0 05/24/2021 1609   MCH 30.0 05/24/2021 1609   MCHC 34.0 05/24/2021 1609   RDW 13.2 05/24/2021 1609   LYMPHSABS 1.3 05/24/2021 1609   MONOABS 0.6 05/24/2021 1609   EOSABS 0.3 05/24/2021 1609   BASOSABS 0.1 05/24/2021 1609    ASSESSMENT AND PLAN: 1. Essential hypertension Not at goal.  He is on several blood pressure medications listed above.  Will refer to advanced hypertension clinic to assist with getting him at goal on for any suggestions in tweaking current meds - Ambulatory referral to Advanced Hypertension Clinic - [facility] - Basic Metabolic Panel  2. History of hemorrhagic cerebrovascular accident (CVA) with residual deficit Patient slowly progressing with pro bono physical therapy sessions through [facility] physical therapy program  3. Stage 3b chronic kidney disease ([place]) We will continue to observe.  Check BMP today.  4.  Hyperaldosteronism ([place]) Has appointment with endocrinology but not until August.  5. Mild major depression ([place]) Feels he is stable on Celexa.   Patient was given the opportunity to ask questions.  Patient verbalized understanding of the plan and was able to repeat key elements of the plan.   This documentation was completed using Radio producer.  Any transcriptional errors are unintentional.  No orders of the defined types were placed in this encounter.    Requested Prescriptions    No prescriptions requested or ordered in this encounter    No follow-ups on file.  Karle Plumber, MD, FACP

## 2022-04-14 LAB — BASIC METABOLIC PANEL
BUN/Creatinine Ratio: 10 (ref 9–20)
BUN: 17 mg/dL (ref 6–24)
CO2: 23 mmol/L (ref 20–29)
Calcium: 9.8 mg/dL (ref 8.7–10.2)
Chloride: 106 mmol/L (ref 96–106)
Creatinine, Ser: 1.72 mg/dL — ABNORMAL HIGH (ref 0.76–1.27)
Glucose: 126 mg/dL — ABNORMAL HIGH (ref 70–99)
Potassium: 4.3 mmol/L (ref 3.5–5.2)
Sodium: 144 mmol/L (ref 134–144)
eGFR: 47 mL/min/{1.73_m2} — ABNORMAL LOW (ref >59)

## 2022-04-15 ENCOUNTER — Other Ambulatory Visit: Payer: Self-pay

## 2022-04-15 MED ORDER — BACLOFEN 10 MG PO TABS
10.0000 mg | ORAL_TABLET | Freq: Three times a day (TID) | ORAL | 2 refills | Status: DC
  Filled 2022-04-15: qty 90, 30d supply, fill #0
  Filled 2022-05-04 – 2022-05-18 (×3): qty 90, 30d supply, fill #1
  Filled 2022-05-21 – 2022-06-17 (×3): qty 90, 30d supply, fill #2

## 2022-04-16 ENCOUNTER — Ambulatory Visit: Payer: Self-pay | Admitting: Physical Medicine & Rehabilitation

## 2022-04-20 ENCOUNTER — Other Ambulatory Visit: Payer: Self-pay

## 2022-04-20 ENCOUNTER — Other Ambulatory Visit: Payer: Self-pay | Admitting: Internal Medicine

## 2022-04-20 DIAGNOSIS — F320 Major depressive disorder, single episode, mild: Secondary | ICD-10-CM

## 2022-04-21 ENCOUNTER — Other Ambulatory Visit: Payer: Self-pay

## 2022-04-21 MED ORDER — CITALOPRAM HYDROBROMIDE 20 MG PO TABS
20.0000 mg | ORAL_TABLET | Freq: Every day | ORAL | 1 refills | Status: DC
  Filled 2022-04-21: qty 90, 90d supply, fill #0
  Filled 2022-05-04 – 2022-08-04 (×4): qty 90, 90d supply, fill #1

## 2022-04-21 MED ORDER — PANTOPRAZOLE SODIUM 40 MG PO TBEC
40.0000 mg | DELAYED_RELEASE_TABLET | Freq: Every day | ORAL | 3 refills | Status: DC
  Filled 2022-04-21: qty 90, 90d supply, fill #0
  Filled 2022-05-04 – 2022-08-04 (×4): qty 90, 90d supply, fill #1
  Filled 2022-09-17 – 2022-10-29 (×3): qty 90, 90d supply, fill #2
  Filled 2023-01-18 (×2): qty 90, 90d supply, fill #3

## 2022-04-21 NOTE — Telephone Encounter (Signed)
Requested medications are due for refill today.  Unsure  Requested medications are on the active medications list.  yes  Last refill. 01/28/2022 #30 2 rf  Future visit scheduled.   yes  Notes to clinic.  No protocol for this medication. Please review for refill.    Requested Prescriptions  Pending Prescriptions Disp Refills   amantadine (SYMMETREL) 100 MG capsule 30 capsule 2    Sig: Take 1 capsule (100 mg total) by mouth daily.     Off-Protocol Failed - 04/20/2022  9:35 AM      Failed - Medication not assigned to a protocol, review manually.      Passed - Valid encounter within last 12 months    Recent Outpatient Visits           1 week ago Essential hypertension   [place], MD   4 months ago Essential hypertension   [place], MD   5 months ago Essential hypertension   [place], MD   8 months ago Essential hypertension   [place] Ladell Pier, MD   11 months ago Establishing care with new doctor, encounter for   [place], MD       Future Appointments             In 4 weeks Skeet Latch, MD [facility] [place]   In 2 months Penumalli, Earlean Polka, MD [facility]   In 3 months Ladell Pier, MD [place]            Signed Prescriptions Disp Refills   citalopram (CELEXA) 20 MG tablet 90 tablet 1    Sig: Take 1 tablet (20 mg total) by mouth daily.     Psychiatry:  Antidepressants - SSRI Passed - 04/20/2022  9:35 AM      Passed - Completed PHQ-2 or PHQ-9 in the last 360 days      Passed - Valid encounter within last 6 months    Recent Outpatient Visits           1 week ago Essential  hypertension   [place], MD   4 months ago Essential hypertension   [place], MD   5 months ago Essential hypertension   [place], MD   8 months ago Essential hypertension   [place] Ladell Pier, MD   11 months ago Establishing care with new doctor, encounter for   [place], MD       Future Appointments             In 4 weeks Skeet Latch, MD [place] [facility], [place]   In 2 months Penumalli, Earlean Polka, MD [facility]   In 3 months Ladell Pier, MD [place]             pantoprazole (PROTONIX) 40 MG tablet 90 tablet 3    Sig: Take 1 tablet (40 mg  total) by mouth at bedtime.     Gastroenterology: Proton Pump Inhibitors Passed - 04/20/2022  9:35 AM      Passed - Valid encounter within last 12 months    Recent Outpatient Visits           1 week ago Essential hypertension   [place], MD   4 months ago Essential hypertension   [place], MD   5 months ago Essential hypertension   [place], MD   8 months ago Essential hypertension   [place], MD   11 months ago Establishing care with new doctor, encounter for   [place], MD       Future Appointments             In 4 weeks Skeet Latch, MD [place] [facility], [place]   In 2 months Penumalli, Earlean Polka, [facility]   In 3 months Ladell Pier, MD [place]

## 2022-04-21 NOTE — Telephone Encounter (Signed)
Requested Prescriptions  Pending Prescriptions Disp Refills   citalopram (CELEXA) 20 MG tablet 90 tablet 1    Sig: Take 1 tablet (20 mg total) by mouth daily.     Psychiatry:  Antidepressants - SSRI Passed - 04/20/2022  9:35 AM      Passed - Completed PHQ-2 or PHQ-9 in the last 360 days      Passed - Valid encounter within last 6 months    Recent Outpatient Visits           1 week ago Essential hypertension   [place], MD   4 months ago Essential hypertension   [place], MD   5 months ago Essential hypertension   [place], MD   8 months ago Essential hypertension   [place] Ladell Pier, MD   11 months ago Establishing care with new doctor, encounter for   [place], MD       Future Appointments             In 4 weeks Skeet Latch, MD [place] [facility], [place]   In 2 months Penumalli, Earlean Polka, MD [facility]   In 3 months Ladell Pier, MD [place]             pantoprazole (PROTONIX) 40 MG tablet 90 tablet 3    Sig: Take 1 tablet (40 mg total) by mouth at bedtime.     Gastroenterology: Proton Pump Inhibitors Passed - 04/20/2022  9:35 AM      Passed - Valid encounter within last 12 months    Recent Outpatient Visits           1 week ago Essential hypertension   [place], MD   4 months ago Essential hypertension   [place], MD   5 months ago Essential hypertension   [place], MD   8 months ago Essential hypertension   [place] Ladell Pier, MD   11 months ago Establishing care with new doctor, encounter for   [place], MD       Future Appointments             In 4 weeks Skeet Latch, MD [place] [facility], [place]   In 2 months Penumalli, Earlean Polka, MD [facility]   In 3 months Ladell Pier, MD [place]             amantadine (SYMMETREL) 100 MG capsule 30 capsule 2    Sig: Take 1 capsule (100 mg total) by mouth daily.     Off-Protocol Failed - 04/20/2022  9:35 AM      Failed - Medication not assigned to a protocol, review manually.      Passed - Valid encounter within last 12 months    Recent Outpatient Visits           1 week ago Essential hypertension   [place], MD   4 months ago Essential hypertension   [place], MD   5 months ago Essential hypertension   [place], MD   8 months ago Essential hypertension   [place], MD   11 months ago Establishing care with new doctor, encounter for   [place], MD       Future Appointments             In 4 weeks Skeet Latch, MD [place] [facility], [place]   In 2 months Penumalli, Earlean Polka, [facility]   In 3 months Ladell Pier, MD [place]

## 2022-04-24 ENCOUNTER — Other Ambulatory Visit: Payer: Self-pay

## 2022-04-27 ENCOUNTER — Other Ambulatory Visit: Payer: Self-pay | Admitting: Physical Medicine and Rehabilitation

## 2022-04-27 ENCOUNTER — Other Ambulatory Visit: Payer: Self-pay

## 2022-04-27 MED ORDER — AMANTADINE HCL 100 MG PO CAPS
100.0000 mg | ORAL_CAPSULE | Freq: Every day | ORAL | 5 refills | Status: DC
  Filled 2022-04-27: qty 90, 90d supply, fill #0
  Filled 2022-05-04 – 2022-08-04 (×3): qty 90, 90d supply, fill #1

## 2022-04-30 ENCOUNTER — Telehealth: Payer: Self-pay

## 2022-04-30 NOTE — Telephone Encounter (Signed)
Patient attempted to be outreached by Estelle June, PharmD Candidate on 04/30/22 to discuss hypertension. Left voicemail for patient to return our call at their convenience at [phone].   Estelle June, PharmD Candidate 2026  [place] [facility]   Maryan Puls, PharmD PGY-1 [facility] Resident

## 2022-05-04 ENCOUNTER — Other Ambulatory Visit: Payer: Self-pay

## 2022-05-07 NOTE — Telephone Encounter (Signed)
Patient attempted to be outreached by Estelle June, PharmD Candidate on 05/07/22 to discuss hypertension. Left voicemail for patient to return our call at their convenience at [phone].   Estelle June, PharmD Candidate 2026  [place] [facility]   Maryan Puls, PharmD PGY-1 [facility] Resident

## 2022-05-08 NOTE — Telephone Encounter (Signed)
Received VM from patent returning Miguel Mccullough's call. Will route to student pharmacists to outreach.   Catie Hedwig Morton, PharmD, [place], [facility] [phone]

## 2022-05-11 NOTE — Progress Notes (Signed)
Patient attempted to be outreached by Darrall Dears, PharmD Candidate on 05/11/2022 to discuss hypertension. Left voicemail for patient to return our call at their convenience at [phone].   Darrall Dears  PharmD Candidate   Joseph Art, Pharm.D. PGY-2 [facility] Resident

## 2022-05-18 ENCOUNTER — Other Ambulatory Visit: Payer: Self-pay

## 2022-05-18 ENCOUNTER — Other Ambulatory Visit (HOSPITAL_COMMUNITY): Payer: Self-pay

## 2022-05-20 ENCOUNTER — Ambulatory Visit (INDEPENDENT_AMBULATORY_CARE_PROVIDER_SITE_OTHER): Payer: Self-pay | Admitting: Cardiovascular Disease

## 2022-05-20 ENCOUNTER — Other Ambulatory Visit: Payer: Self-pay

## 2022-05-20 ENCOUNTER — Encounter (HOSPITAL_BASED_OUTPATIENT_CLINIC_OR_DEPARTMENT_OTHER): Payer: Self-pay | Admitting: Cardiovascular Disease

## 2022-05-20 ENCOUNTER — Telehealth: Payer: Self-pay | Admitting: Cardiovascular Disease

## 2022-05-20 VITALS — BP 155/90 | HR 76 | Ht 64.0 in | Wt 200.6 lb

## 2022-05-20 DIAGNOSIS — Z5181 Encounter for therapeutic drug level monitoring: Secondary | ICD-10-CM

## 2022-05-20 DIAGNOSIS — I610 Nontraumatic intracerebral hemorrhage in hemisphere, subcortical: Secondary | ICD-10-CM

## 2022-05-20 DIAGNOSIS — E7800 Pure hypercholesterolemia, unspecified: Secondary | ICD-10-CM

## 2022-05-20 DIAGNOSIS — G4733 Obstructive sleep apnea (adult) (pediatric): Secondary | ICD-10-CM

## 2022-05-20 DIAGNOSIS — N1832 Chronic kidney disease, stage 3b: Secondary | ICD-10-CM

## 2022-05-20 DIAGNOSIS — I1A0 Resistant hypertension: Secondary | ICD-10-CM

## 2022-05-20 MED ORDER — VALSARTAN 80 MG PO TABS
80.0000 mg | ORAL_TABLET | Freq: Every day | ORAL | 3 refills | Status: DC
  Filled 2022-05-20: qty 90, 90d supply, fill #0
  Filled 2022-07-28 – 2022-08-04 (×3): qty 90, 90d supply, fill #1
  Filled 2022-09-17: qty 90, 90d supply, fill #2

## 2022-05-20 MED ORDER — EPLERENONE 25 MG PO TABS
25.0000 mg | ORAL_TABLET | Freq: Every day | ORAL | 3 refills | Status: DC
  Filled 2022-05-20: qty 30, 30d supply, fill #0
  Filled 2022-06-17 (×2): qty 30, 30d supply, fill #1
  Filled 2022-06-28 – 2022-07-10 (×3): qty 30, 30d supply, fill #2
  Filled 2022-08-06: qty 30, 30d supply, fill #3
  Filled 2022-08-25 – 2022-08-31 (×2): qty 30, 30d supply, fill #4
  Filled 2022-09-17: qty 30, 30d supply, fill #5

## 2022-05-20 MED ORDER — EPLERENONE 25 MG PO TABS
25.0000 mg | ORAL_TABLET | Freq: Every day | ORAL | 3 refills | Status: DC
  Filled 2022-05-20: qty 90, 90d supply, fill #0

## 2022-05-20 NOTE — Patient Instructions (Addendum)
Medication Instructions:  STOP SPIROLACTONE   START VALSARTAN 80 MG DAILY   EPLERONONE 25 MG DAILY    Labwork: BMET IN 1 WEEK    Testing/Procedures: Your physician has requested that you have a renal artery duplex. During this test, an ultrasound is used to evaluate blood flow to the kidneys. Allow one hour for this exam. Do not eat after midnight the day before and avoid carbonated beverages. Take your medications as you usually do.  Your physician has recommended that you have a sleep study. This test records several body functions during sleep, including: brain activity, eye movement, oxygen and carbon dioxide blood levels, heart rate and rhythm, breathing rate and rhythm, the flow of air through your mouth and nose, snoring, body muscle movements, and chest and belly movement.   Follow-Up: 06/26/2022 2:30 with PHARM D AT [facility]    Special Instructions:  MONITOR YOUR BLOOD PRESSURE TWICE A DAY, LOG IN THE BOOK PROVIDED. BRING THE BOOK AND YOUR BLOOD PRESSURE MACHINE TO YOUR FOLLOW UP IN 1 MONTH   DASH Eating Plan DASH stands for "Dietary Approaches to Stop Hypertension." The DASH eating plan is a healthy eating plan that has been shown to reduce high blood pressure (hypertension). It may also reduce your risk for type 2 diabetes, heart disease, and stroke. The DASH eating plan may also help with weight loss. What are tips for following this plan?  General guidelines Avoid eating more than 2,300 mg (milligrams) of salt (sodium) a day. If you have hypertension, you may need to reduce your sodium intake to 1,500 mg a day. Limit alcohol intake to no more than 1 drink a day for nonpregnant women and 2 drinks a day for men. One drink equals 12 oz of beer, 5 oz of wine, or 1 oz of hard liquor. Work with your health care provider to maintain a healthy body weight or to lose weight. Ask what an ideal weight is for you. Get at least 30 minutes of exercise that causes your heart to  beat faster (aerobic exercise) most days of the week. Activities may include walking, swimming, or biking. Work with your health care provider or diet and nutrition specialist (dietitian) to adjust your eating plan to your individual calorie needs. Reading food labels  Check food labels for the amount of sodium per serving. Choose foods with less than 5 percent of the Daily Value of sodium. Generally, foods with less than 300 mg of sodium per serving fit into this eating plan. To find whole grains, look for the word "whole" as the first word in the ingredient list. Shopping Buy products labeled as "low-sodium" or "no salt added." Buy fresh foods. Avoid canned foods and premade or frozen meals. Cooking Avoid adding salt when cooking. Use salt-free seasonings or herbs instead of table salt or sea salt. Check with your health care provider or pharmacist before using salt substitutes. Do not fry foods. Cook foods using healthy methods such as baking, boiling, grilling, and broiling instead. Cook with heart-healthy oils, such as olive, canola, soybean, or sunflower oil. Meal planning Eat a balanced diet that includes: 5 or more servings of fruits and vegetables each day. At each meal, try to fill half of your plate with fruits and vegetables. Up to 6-8 servings of whole grains each day. Less than 6 oz of lean meat, poultry, or fish each day. A 3-oz serving of meat is about the same size as a deck of cards. One egg equals 1  oz. 2 servings of low-fat dairy each day. A serving of nuts, seeds, or beans 5 times each week. Heart-healthy fats. Healthy fats called Omega-3 fatty acids are found in foods such as flaxseeds and coldwater fish, like sardines, salmon, and mackerel. Limit how much you eat of the following: Canned or prepackaged foods. Food that is high in trans fat, such as fried foods. Food that is high in saturated fat, such as fatty meat. Sweets, desserts, sugary drinks, and other foods with  added sugar. Full-fat dairy products. Do not salt foods before eating. Try to eat at least 2 vegetarian meals each week. Eat more home-cooked food and less restaurant, buffet, and fast food. When eating at a restaurant, ask that your food be prepared with less salt or no salt, if possible. What foods are recommended? The items listed may not be a complete list. Talk with your dietitian about what dietary choices are best for you. Grains Whole-grain or whole-wheat bread. Whole-grain or whole-wheat pasta. Brown rice. [place]. Bulgur. Whole-grain and low-sodium cereals. Pita bread. Low-fat, low-sodium crackers. Whole-wheat flour tortillas. Vegetables Fresh or frozen vegetables (raw, steamed, roasted, or grilled). Low-sodium or reduced-sodium tomato and vegetable juice. Low-sodium or reduced-sodium tomato sauce and tomato paste. Low-sodium or reduced-sodium canned vegetables. Fruits All fresh, dried, or frozen fruit. Canned fruit in natural juice (without added sugar). Meat and other protein foods Skinless chicken or [place]. Ground chicken or [place]. Pork with fat trimmed off. Fish and seafood. Egg whites. Dried beans, peas, or lentils. Unsalted nuts, nut butters, and seeds. Unsalted canned beans. Lean cuts of beef with fat trimmed off. Low-sodium, lean deli meat. Dairy Low-fat (1%) or fat-free (skim) milk. Fat-free, low-fat, or reduced-fat cheeses. Nonfat, low-sodium ricotta or cottage cheese. Low-fat or nonfat yogurt. Low-fat, low-sodium cheese. Fats and oils Soft margarine without trans fats. Vegetable oil. Low-fat, reduced-fat, or light mayonnaise and salad dressings (reduced-sodium). Canola, safflower, olive, soybean, and sunflower oils. Avocado. Seasoning and other foods Herbs. Spices. Seasoning mixes without salt. Unsalted popcorn and pretzels. Fat-free sweets. What foods are not recommended? The items listed may not be a complete list. Talk with your dietitian about what dietary  choices are best for you. Grains Baked goods made with fat, such as croissants, muffins, or some breads. Dry pasta or rice meal packs. Vegetables Creamed or fried vegetables. Vegetables in a cheese sauce. Regular canned vegetables (not low-sodium or reduced-sodium). Regular canned tomato sauce and paste (not low-sodium or reduced-sodium). Regular tomato and vegetable juice (not low-sodium or reduced-sodium). Angie Fava. Olives. Fruits Canned fruit in a light or heavy syrup. Fried fruit. Fruit in cream or butter sauce. Meat and other protein foods Fatty cuts of meat. Ribs. Fried meat. Berniece Salines. Sausage. Bologna and other processed lunch meats. Salami. Fatback. Hotdogs. Bratwurst. Salted nuts and seeds. Canned beans with added salt. Canned or smoked fish. Whole eggs or egg yolks. Chicken or [place] with skin. Dairy Whole or 2% milk, cream, and half-and-half. Whole or full-fat cream cheese. Whole-fat or sweetened yogurt. Full-fat cheese. Nondairy creamers. Whipped toppings. Processed cheese and cheese spreads. Fats and oils Butter. Stick margarine. Lard. Shortening. Ghee. Bacon fat. Tropical oils, such as coconut, palm kernel, or palm oil. Seasoning and other foods Salted popcorn and pretzels. Onion salt, garlic salt, seasoned salt, table salt, and sea salt. Worcestershire sauce. Tartar sauce. Barbecue sauce. Teriyaki sauce. Soy sauce, including reduced-sodium. Steak sauce. Canned and packaged gravies. Fish sauce. Oyster sauce. Cocktail sauce. Horseradish that you find on the shelf. Ketchup. Mustard. Meat flavorings  and tenderizers. Bouillon cubes. Hot sauce and Tabasco sauce. Premade or packaged marinades. Premade or packaged taco seasonings. Relishes. Regular salad dressings. Where to find more information: National Heart, Lung, and [place]: https://wilson-eaton.com/ American Heart Association: www.heart.org Summary The DASH eating plan is a healthy eating plan that has been shown to reduce high blood  pressure (hypertension). It may also reduce your risk for type 2 diabetes, heart disease, and stroke. With the DASH eating plan, you should limit salt (sodium) intake to 2,300 mg a day. If you have hypertension, you may need to reduce your sodium intake to 1,500 mg a day. When on the DASH eating plan, aim to eat more fresh fruits and vegetables, whole grains, lean proteins, low-fat dairy, and heart-healthy fats. Work with your health care provider or diet and nutrition specialist (dietitian) to adjust your eating plan to your individual calorie needs. This information is not intended to replace advice given to you by your health care provider. Make sure you discuss any questions you have with your health care provider. Document Released: 01/22/2011 Document Revised: 01/15/2017 Document Reviewed: 01/27/2016 Elsevier Patient Education  2020 [employer].

## 2022-05-20 NOTE — Telephone Encounter (Signed)
Chart shows patient does not have any insurance. Ok  to change split night sleep study ordered to HST?

## 2022-05-20 NOTE — Progress Notes (Signed)
Advanced Hypertension Clinic Initial Assessment:    Date:  05/20/2022   ID:  Miguel Mccullough, DOB 1965/08/18, MRN [id number]  PCP:  Ladell Pier, MD  Cardiologist:  None  Nephrologist:  Referring MD: Ladell Pier, MD   CC: Hypertension  History of Present Illness:    Miguel Mccullough is a 56 y.o. male with a hx of hypertension, hemorrhagic stroke, L hemiparesis, CKD 3, and seizure disorder here to establish care in the Advanced Hypertension Clinic.   Miguel Mccullough reports having high blood pressure since his high school or twenties, describing it as always hard to control. He monitors his blood pressure at home, observing readings between 145/90 to 153/90. His physical activity has significantly decreased following a stroke a year ago, which affected his balance and left side's functionality. Before the stroke, he was highly active, working 70 hours a week, serving as a Physiological scientist, and running a Scientist, water quality. He plans to resume rehabilitation sessions in June.  Miguel Mccullough denies experiencing any chest pain or pressure and states that his blood pressure remained high even when he was more active. Regarding his diet, he mentions cooking frequently at home, including pasta, chicken, potatoes, and greens in his meals. He minimizes salt and caffeine intake, drinks herbal teas, occasionally coffee, and does not consume alcohol. For pain, he takes Tylenol.  He acknowledges snoring but denies having sleep apnea, although he sometimes feels unrested upon waking and has been waking up frequently during the night. He had a sleep study 15 years ago and was prescribed a CPAP machine, which he stopped using due to perceived ineffectiveness.  Miguel Mccullough is currently on spironolactone, which was started in October.  He is unaware of any family history of difficult-to-control high blood pressure, though his mother has hypertension under control, and his deceased father also had the condition. He  has no personal history of smoking.  He had an echo 05/2021 that revealed LVEF 65 to 70% with moderate LVH and grade 1 diastolic dysfunction.  Renal and and aldosterone levels were checked 05/2021.  Aldosterone was 12.1 and renin 0.21.  Ratio was 55.8.  He was referred to endocrine and is scheduled to see them in August.  He was subsequently started on spironolactone.  Previous antihypertensives:    Past Medical History:  Diagnosis Date   COVID-19 2021   continues to have increase in suptum production.   Hypertension    TIA (transient ischemic attack)     No past surgical history on file.  Current Medications: Current Meds  Medication Sig   amantadine (SYMMETREL) 100 MG capsule Take 1 capsule (100 mg total) by mouth daily.   amLODipine (NORVASC) 10 MG tablet Take 1 tablet (10 mg total) by mouth at bedtime.   aspirin 81 MG EC tablet Take 1 tablet (81 mg total) by mouth daily. Swallow whole.   atorvastatin (LIPITOR) 40 MG tablet Take 1 tablet (40 mg total) by mouth at bedtime.   baclofen (LIORESAL) 10 MG tablet Take 1 tablet (10 mg total) by mouth 3 (three) times daily.   citalopram (CELEXA) 20 MG tablet Take 1 tablet (20 mg total) by mouth daily.   cloNIDine (CATAPRES - DOSED IN MG/24 HR) 0.3 mg/24hr patch Place 1 patch (0.3 mg total) onto the skin every Friday.   hydrALAZINE (APRESOLINE) 100 MG tablet Take 1 tablet (100 mg total) by mouth every 8 (eight) hours.   levETIRAcetam (KEPPRA) 500 MG tablet Take 1 tablet (500 mg  total) by mouth 2 (two) times daily.   magnesium oxide (MAG-OX) 400 MG tablet Take 1 tablet (400 mg total) by mouth daily.   metoprolol tartrate (LOPRESSOR) 50 MG tablet Take 1 & 1/2 tablets (75 mg total) by mouth 2 (two) times daily.   pantoprazole (PROTONIX) 40 MG tablet Take 1 tablet (40 mg total) by mouth at bedtime.   senna-docusate (SENOKOT-S) 8.6-50 MG tablet Take 2 tablets by mouth at bedtime as needed for mild constipation. (Patient taking differently: Take 2  tablets by mouth See admin instructions. Take 2 tablets by mouth every other night)   tiZANidine (ZANAFLEX) 4 MG tablet Take 1 tablet (4 mg total) by mouth every 8 (eight) hours as needed for muscle spasms (spasticity/tightness).   traZODone (DESYREL) 150 MG tablet Take 0.5 tablets (75 mg total) by mouth at bedtime.   valsartan (DIOVAN) 80 MG tablet Take 1 tablet (80 mg total) by mouth daily.   [DISCONTINUED] eplerenone (INSPRA) 25 MG tablet Take 1 tablet (25 mg total) by mouth daily.   [DISCONTINUED] spironolactone (ALDACTONE) 25 MG tablet Take 1 tablet (25 mg total) by mouth once daily.     Allergies:   Spironolactone   Social History   Socioeconomic History   Marital status: Married    Spouse name: [name]   Number of children: 1   Years of education: Not on file   Highest education level: 12th grade  Occupational History   Not on file  Tobacco Use   Smoking status: Never   Smokeless tobacco: Never  Vaping Use   Vaping Use: Never used  Substance and Sexual Activity   Alcohol use: Not Currently   Drug use: Never   Sexual activity: Not Currently  Other Topics Concern   Not on file  Social History Narrative   Lives with wife   Social Determinants of Health   Financial Resource Strain: Not on file  Food Insecurity: Not on file  Transportation Needs: Not on file  Physical Activity: Not on file  Stress: Not on file  Social Connections: Not on file     Family History: The patient's family history includes Hypertension in his brother, father, mother, and sister.  ROS:   Please see the history of present illness.     All other systems reviewed and are negative.  EKGs/Labs/Other Studies Reviewed:    EKG:  EKG is not ordered today.  Recent Labs: 05/24/2021: ALT 42; Hemoglobin 14.6; Platelets 253 05/25/2021: TSH 3.456 04/13/2022: BUN 17; Creatinine, Ser 1.72; Potassium 4.3; Sodium 144   Recent Lipid Panel    Component Value Date/Time   CHOL 101 05/25/2021 0436    TRIG 24 05/25/2021 0436   HDL 47 05/25/2021 0436   CHOLHDL 2.1 05/25/2021 0436   VLDL 5 05/25/2021 0436   LDLCALC 49 05/25/2021 0436    Physical Exam:   VS:  BP (!) 155/90 (BP Location: Right Arm, Patient Position: Sitting, Cuff Size: Large)   Pulse 76   Ht 5\' 4"  (1.626 m)   Wt 200 lb 9.6 oz (91 kg)   SpO2 94%   BMI 34.43 kg/m  , BMI Body mass index is 34.43 kg/m. GENERAL:  Well appearing HEENT: Pupils equal round and reactive, fundi not visualized, oral mucosa unremarkable NECK:  No jugular venous distention, waveform within normal limits, carotid upstroke brisk and symmetric, no bruits, no thyromegaly LUNGS:  Clear to auscultation bilaterally HEART:  RRR.  PMI not displaced or sustained,S1 and S2 within normal limits, no S3, no  S4, no clicks, no rubs, no murmurs ABD:  Flat, positive bowel sounds normal in frequency in pitch, no bruits, no rebound, no guarding, no midline pulsatile mass, no hepatomegaly, no splenomegaly EXT:  2 plus pulses throughout, no edema, no cyanosis no clubbing SKIN:  No rashes no nodules NEURO:  Cranial nerves II through XII grossly intact.  L sided UE/LE weakness PSYCH:  Cognitively intact, oriented to person place and time   ASSESSMENT/PLAN:    # Resistant Hypertension: - Continue home blood pressure monitoring and record in the provided hypertension clinic book.  Continue amlodipine, clonidine patch, hydralazine, and metoprolol.  Switch spironolactone to eplerenone due to gynecomastia and breast tenderness. - Start Valsartan 80 mg daily. - Consider switching from Metoprolol to Carvedilol in the future if blood pressure remains uncontrolled. - Recheck labs in one week. - Schedule a renal artery Doppler Lifestyle modifications: - Encourage a low-sodium diet and continue avoiding excessive caffeine and alcohol intake. - Discuss the potential benefits of increasing physical activity as tolerated.  # Chronic Kidney Disease 3b: - Switch from  Spironolactone to Eplerenone 25mg  - Monitor kidney function closely with Valsartan initiation. - Follow up with a nephrologist as needed.  # Sleep Apnea and Snoring: - Schedule a sleep study to reassess sleep apnea. - Consider reinitiating CPAP therapy if indicated.  Follow-up in four months, with interim follow-ups at the hypertension clinic as needed.  Screening for Secondary Hypertension:     05/20/2022    2:40 PM  Causes  Drugs/Herbals Screened     - Comments limits caffeine and no EtOH.  Limits salt  No NSAID.  Renovascular HTN Screened     - Comments Check renal Dopplers  Sleep Apnea Screened     - Comments snores.  daytime somnolence.  Prior CPAP.  Thyroid Disease Screened  Hyperaldosteronism Screened     - Comments Renin/aldo indeterminate    Relevant Labs/Studies:    Latest Ref Rng & Units 04/13/2022    5:00 PM 09/05/2021   11:26 AM 07/28/2021    3:07 PM  Basic Labs  Sodium 134 - 144 mmol/L 144  141  143   Potassium 3.5 - 5.2 mmol/L 4.3  4.6  3.9   Creatinine 0.76 - 1.27 mg/dL 1.72  2.10  1.72        Latest Ref Rng & Units 05/25/2021    4:36 AM  Thyroid   TSH 0.350 - 4.500 uIU/mL 3.456        Latest Ref Rng & Units 05/20/2021    3:32 PM  Renin/Aldosterone   Aldosterone 0.0 - 30.0 ng/dL 12.1   Renin 0.167 - 5.380 ng/mL/hr 0.217   Aldos/Renin Ratio 0.0 - 30.0 55.8       Disposition:    FU with MD/PharmD in 1 month    Medication Adjustments/Labs and Tests Ordered: Current medicines are reviewed at length with the patient today.  Concerns regarding medicines are outlined above.  Orders Placed This Encounter  Procedures   Basic metabolic panel   Split night study   Meds ordered this encounter  Medications   valsartan (DIOVAN) 80 MG tablet    Sig: Take 1 tablet (80 mg total) by mouth daily.    Dispense:  90 tablet    Refill:  3   DISCONTD: eplerenone (INSPRA) 25 MG tablet    Sig: Take 1 tablet (25 mg total) by mouth daily.    Dispense:  90 tablet     Refill:  3   eplerenone (  INSPRA) 25 MG tablet    Sig: Take 1 tablet (25 mg total) by mouth daily.    Dispense:  90 tablet    Refill:  3    D/C SPIRONOLACTONE     Signed, Skeet Latch, MD  05/20/2022 5:28 PM    [place]

## 2022-05-21 ENCOUNTER — Other Ambulatory Visit: Payer: Self-pay

## 2022-05-21 ENCOUNTER — Other Ambulatory Visit: Payer: Self-pay | Admitting: Internal Medicine

## 2022-05-21 DIAGNOSIS — I10 Essential (primary) hypertension: Secondary | ICD-10-CM

## 2022-05-21 MED ORDER — HYDRALAZINE HCL 100 MG PO TABS
100.0000 mg | ORAL_TABLET | Freq: Three times a day (TID) | ORAL | 2 refills | Status: DC
Start: 2022-05-21 — End: 2022-09-18
  Filled 2022-05-21: qty 90, 30d supply, fill #0
  Filled 2022-06-28 – 2022-07-10 (×3): qty 90, 30d supply, fill #1
  Filled 2022-08-06: qty 90, 30d supply, fill #2
  Filled 2022-08-25 – 2022-08-31 (×3): qty 90, 30d supply, fill #3
  Filled 2022-09-17: qty 90, 30d supply, fill #4

## 2022-05-21 NOTE — Telephone Encounter (Signed)
Requested medication (s) are due for refill today: Yes  Requested medication (s) are on the active medication list: Yes  Last refill:  09/14/21  Future visit scheduled: No  Notes to clinic:  Protocol indicates lab work is needed.     Requested Prescriptions  Pending Prescriptions Disp Refills   hydrALAZINE (APRESOLINE) 100 MG tablet 270 tablet 2    Sig: Take 1 tablet (100 mg total) by mouth every 8 (eight) hours.     Cardiovascular:  Vasodilators Failed - 05/21/2022 10:52 AM      Failed - HCT in normal range and within 360 days    HCT  Date Value Ref Range Status  05/24/2021 43.0 39.0 - 52.0 % Final         Failed - HGB in normal range and within 360 days    Hemoglobin  Date Value Ref Range Status  05/24/2021 14.6 13.0 - 17.0 g/dL Final         Failed - RBC in normal range and within 360 days    RBC  Date Value Ref Range Status  05/24/2021 4.84 4.22 - 5.81 MIL/uL Final         Failed - WBC in normal range and within 360 days    WBC  Date Value Ref Range Status  05/24/2021 8.7 4.0 - 10.5 K/uL Final         Failed - PLT in normal range and within 360 days    Platelets  Date Value Ref Range Status  05/24/2021 253 150 - 400 K/uL Final         Failed - ANA Screen, Ifa, Serum in normal range and within 360 days    No results found for: "ANA", "ANATITER", "LABANTI"       Failed - Last BP in normal range    BP Readings from Last 1 Encounters:  05/20/22 (!) 155/90         Passed - Valid encounter within last 12 months    Recent Outpatient Visits           1 month ago Essential hypertension   [place], MD   5 months ago Essential hypertension   [place], MD   6 months ago Essential hypertension   [place], MD   9 months ago Essential hypertension   [place]  Ladell Pier, MD   1 year ago Establishing care with new doctor, encounter for   [place], MD       Future Appointments             In 1 month Penumalli, Earlean Polka, MD [facility]   In 1 month  [facility]   In 2 months [place], MD [place]

## 2022-05-21 NOTE — Telephone Encounter (Signed)
Discussed with Dr Oval Linsey and ok to change to home sleep study, order placed  Advised patients wife, verbalized understading.

## 2022-05-22 ENCOUNTER — Other Ambulatory Visit: Payer: Self-pay

## 2022-05-27 ENCOUNTER — Telehealth: Payer: Self-pay | Admitting: Licensed Clinical Social Worker

## 2022-05-27 NOTE — Telephone Encounter (Signed)
H&V Care Navigation CSW Progress Note  Clinical Social Worker contacted patient by phone to f/u after appt with Dr. Duke Salvia as pt is currently uninsured. Note from account was over guidelines for Medicaid but didn't appear to be screened for CAFA. I messaged Christia Reading, financial counseling and [employer] team lead Clement Husbands inquiring if this would be helpful. LCSW was unable to reach pt at [phone], left voicemail.  Patient is participating in a Managed Medicaid Plan:  No, self pay only.  SDOH Screenings   Depression (PHQ2-9): Medium Risk (04/13/2022)  Tobacco Use: Low Risk  (05/20/2022)    Miguel Mccullough, MSW, LCSW Clinical Social Worker II [facility] Heart/Vascular Care Navigation  [phone]- work cell phone (preferred) [phone]- desk phone

## 2022-05-28 ENCOUNTER — Telehealth: Payer: Self-pay | Admitting: Licensed Clinical Social Worker

## 2022-05-28 NOTE — Telephone Encounter (Signed)
H&V Care Navigation CSW Progress Note  Clinical Social Worker contacted patient by phone to f/u after appt with Dr. Duke Salvia as pt is currently uninsured. Note from account was over guidelines for Medicaid but didn't appear to be screened for CAFA. Financial counseling notes that pt may be eligible for assistance with some bills if meets criteria for CAFA. LCSW was unable to reach pt at [phone], left voicemail again today. Will attempt one more time and then send pt application information.  Patient is participating in a Managed Medicaid Plan:  No, self pay only.  SDOH Screenings   Depression (PHQ2-9): Medium Risk (04/13/2022)  Tobacco Use: Low Risk  (05/20/2022)    Octavio Graves, MSW, LCSW Clinical Social Worker II [facility] Heart/Vascular Care Navigation  [phone]- work cell phone (preferred) [phone]- desk phone

## 2022-06-04 LAB — BASIC METABOLIC PANEL
BUN/Creatinine Ratio: 9 (ref 9–20)
BUN: 17 mg/dL (ref 6–24)
CO2: 22 mmol/L (ref 20–29)
Calcium: 9.2 mg/dL (ref 8.7–10.2)
Chloride: 106 mmol/L (ref 96–106)
Creatinine, Ser: 1.93 mg/dL — ABNORMAL HIGH (ref 0.76–1.27)
Glucose: 105 mg/dL — ABNORMAL HIGH (ref 70–99)
Potassium: 4.4 mmol/L (ref 3.5–5.2)
Sodium: 143 mmol/L (ref 134–144)
eGFR: 41 mL/min/{1.73_m2} — ABNORMAL LOW (ref >59)

## 2022-06-05 ENCOUNTER — Telehealth: Payer: Self-pay | Admitting: Licensed Clinical Social Worker

## 2022-06-05 NOTE — Telephone Encounter (Signed)
H&V Care Navigation CSW Progress Note  Clinical Social Worker contacted patient by phone to f/u on assistance applications. No answer at [phone], left third voicemail for pt. Will mail applications and my card for additional assistance if needed.   Patient is participating in a Managed Medicaid Plan:  No, self pay only.  SDOH Screenings   Depression (PHQ2-9): Medium Risk (04/13/2022)  Tobacco Use: Low Risk  (05/20/2022)    Octavio Graves, MSW, LCSW Clinical Social Worker II [facility] Heart/Vascular Care Navigation  [phone]- work cell phone (preferred) [phone]- desk phone

## 2022-06-12 ENCOUNTER — Telehealth (HOSPITAL_BASED_OUTPATIENT_CLINIC_OR_DEPARTMENT_OTHER): Payer: Self-pay

## 2022-06-12 DIAGNOSIS — I1A0 Resistant hypertension: Secondary | ICD-10-CM

## 2022-06-12 NOTE — Telephone Encounter (Addendum)
Called results to patient and left results on VM (ok per DPR), instructions left to call office back if patient has any questions! Added to renal appointment notes.   ----- Message from Alver Sorrow, NP sent at 06/11/2022  9:27 PM EDT ----- Kidney function decreased from last lab draw but overall stable over the years. Ensure staying hydrate. Recommend repeat BMP on 06/18/22 when he is in office for renal duplex to ensure stable kidney function.

## 2022-06-17 ENCOUNTER — Other Ambulatory Visit: Payer: Self-pay | Admitting: Internal Medicine

## 2022-06-17 ENCOUNTER — Other Ambulatory Visit: Payer: Self-pay

## 2022-06-17 DIAGNOSIS — I6930 Unspecified sequelae of cerebral infarction: Secondary | ICD-10-CM

## 2022-06-17 MED ORDER — TRAZODONE HCL 150 MG PO TABS
75.0000 mg | ORAL_TABLET | Freq: Every day | ORAL | 0 refills | Status: DC
Start: 2022-06-17 — End: 2022-09-17
  Filled 2022-06-17: qty 15, 30d supply, fill #0
  Filled 2022-07-19 – 2022-07-20 (×2): qty 15, 30d supply, fill #1
  Filled 2022-08-25 – 2022-08-26 (×2): qty 15, 30d supply, fill #2

## 2022-06-18 ENCOUNTER — Encounter (HOSPITAL_BASED_OUTPATIENT_CLINIC_OR_DEPARTMENT_OTHER): Payer: Self-pay

## 2022-06-19 ENCOUNTER — Telehealth (HOSPITAL_BASED_OUTPATIENT_CLINIC_OR_DEPARTMENT_OTHER): Payer: Self-pay | Admitting: Cardiovascular Disease

## 2022-06-19 NOTE — Telephone Encounter (Signed)
Left message for patient to call and reschedule the renal artery duplex  ordered by Dr. Duke Salvia.  Appointment missed  06/18/22

## 2022-06-22 ENCOUNTER — Other Ambulatory Visit: Payer: Self-pay

## 2022-06-22 ENCOUNTER — Encounter: Payer: Self-pay | Admitting: Diagnostic Neuroimaging

## 2022-06-22 ENCOUNTER — Telehealth (INDEPENDENT_AMBULATORY_CARE_PROVIDER_SITE_OTHER): Payer: Self-pay | Admitting: Diagnostic Neuroimaging

## 2022-06-22 ENCOUNTER — Encounter: Payer: Self-pay | Admitting: Physical Medicine and Rehabilitation

## 2022-06-22 DIAGNOSIS — G459 Transient cerebral ischemic attack, unspecified: Secondary | ICD-10-CM

## 2022-06-22 DIAGNOSIS — G40109 Localization-related (focal) (partial) symptomatic epilepsy and epileptic syndromes with simple partial seizures, not intractable, without status epilepticus: Secondary | ICD-10-CM

## 2022-06-22 DIAGNOSIS — I610 Nontraumatic intracerebral hemorrhage in hemisphere, subcortical: Secondary | ICD-10-CM

## 2022-06-22 MED ORDER — LEVETIRACETAM 500 MG PO TABS
500.0000 mg | ORAL_TABLET | Freq: Two times a day (BID) | ORAL | 4 refills | Status: DC
  Filled 2022-07-19: qty 180, 90d supply, fill #0
  Filled 2022-09-17 – 2022-10-23 (×3): qty 180, 90d supply, fill #1
  Filled 2023-01-18: qty 180, 90d supply, fill #2
  Filled 2023-04-20: qty 180, 90d supply, fill #3

## 2022-06-22 NOTE — Progress Notes (Signed)
[facility]  PATIENT: Miguel Mccullough DOB: [birth year]  REFERRING CLINICIAN: No ref. provider found HISTORY FROM: patient  REASON FOR VISIT: follow up   HISTORICAL  CHIEF COMPLAINT:  Chief Complaint  Patient presents with   Cerebrovascular Accident   Seizures         HISTORY OF PRESENT ILLNESS:   UPDATE (06/22/22, VRP): Since last visit, doing well. Now new issues. No seizures. Left sided weakness improving. Tolerating LEV.   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  Each episode lasted 4 to 5 minutes.  Patient was diagnosed with left brain TIA.  Work-up was completed.  EEG was negative for epileptiform discharges.  Patient was diagnosed with TIAs versus seizures.  He was discharged on aspirin and Keppra.  Since that time no further events. Patient doing well.   REVIEW OF SYSTEMS: Full 14 system review of systems performed and negative with exception of: As per HPI.  ALLERGIES: Allergies  Allergen Reactions   Spironolactone     HOME MEDICATIONS: Outpatient Medications Prior to Visit  Medication Sig Dispense Refill   amantadine (SYMMETREL) 100 MG capsule Take 1 capsule (100 mg total) by mouth daily. 30 capsule 5   amLODipine (NORVASC) 10 MG tablet Take 1 tablet (10 mg total) by mouth at bedtime. 90 tablet 1   atorvastatin (LIPITOR) 40 MG tablet Take 1 tablet (40 mg total) by mouth at bedtime. 30 tablet 6   baclofen (LIORESAL) 10 MG tablet Take 1 tablet (10 mg total) by mouth 3 (three) times daily. 90 each 2   citalopram (CELEXA) 20 MG tablet Take 1 tablet (20 mg total) by mouth daily. 90 tablet 1   cloNIDine (CATAPRES - DOSED IN MG/24 HR) 0.3 mg/24hr patch Place 1 patch (0.3 mg total) onto the skin every Friday. 12  patch 3   eplerenone (INSPRA) 25 MG tablet Take 1 tablet (25 mg total) by mouth daily. 90 tablet 3   hydrALAZINE (APRESOLINE) 100 MG tablet Take 1 tablet (100 mg total) by mouth every 8 (eight) hours. 270 tablet 2   levETIRAcetam (KEPPRA) 500 MG tablet Take 1 tablet (500 mg total) by mouth 2 (two) times daily. 180 tablet 4   magnesium oxide (MAG-OX) 400 MG tablet Take 1 tablet (400 mg total) by mouth daily. 90 tablet 1   metoprolol tartrate (LOPRESSOR) 50 MG tablet Take 1 & 1/2 tablets (75 mg total) by mouth 2 (two) times daily. 90 tablet 6   pantoprazole (PROTONIX) 40 MG tablet Take 1 tablet (40 mg total) by mouth at bedtime. 90 tablet 3   senna-docusate (SENOKOT-S) 8.6-50 MG tablet Take 2 tablets by mouth at bedtime as needed for mild constipation. (Patient taking differently: Take 2 tablets by mouth See admin instructions. Take 2 tablets by mouth every other night) 100 tablet 3   tiZANidine (ZANAFLEX) 4 MG tablet Take 1 tablet (4 mg total) by mouth every 8 (eight) hours as needed for muscle spasms (spasticity/tightness). 90 tablet 5   traZODone (DESYREL) 150 MG tablet Take 0.5 tablets (75 mg total) by mouth at bedtime. 45 tablet 0   valsartan (DIOVAN) 80 MG tablet Take 1 tablet (80 mg total) by mouth daily. 90 tablet 3   No facility-administered medications prior to visit.    PAST MEDICAL HISTORY: Past Medical History:  Diagnosis Date  COVID-19 2021   continues to have increase in suptum production.   Hypertension    TIA (transient ischemic attack)     PAST SURGICAL HISTORY: No past surgical history on file.  FAMILY HISTORY: Family History  Problem Relation Age of Onset   Hypertension Mother    Hypertension Father    Hypertension Sister    Hypertension Brother     SOCIAL HISTORY: Social History   Socioeconomic History   Marital status: Married    Spouse name: Judeth Cornfield   Number of children: 1   Years of education: Not on file   Highest education level: 12th grade   Occupational History   Not on file  Tobacco Use   Smoking status: Never   Smokeless tobacco: Never  Vaping Use   Vaping Use: Never used  Substance and Sexual Activity   Alcohol use: Not Currently   Drug use: Never   Sexual activity: Not Currently  Other Topics Concern   Not on file  Social History Narrative   Lives with wife   Social Determinants of Health   Financial Resource Strain: Not on file  Food Insecurity: Not on file  Transportation Needs: Not on file  Physical Activity: Not on file  Stress: Not on file  Social Connections: Not on file  Intimate Partner Violence: Not on file     PHYSICAL EXAM  Video visit: exam from 06/17/21  NEUROLOGIC: MENTAL STATUS:      No data to display         awake, alert, oriented to person, place and time recent and remote memory intact normal attention and concentration language fluent, comprehension intact, naming intact fund of knowledge appropriate  CRANIAL NERVE:  2nd - no papilledema on fundoscopic exam 2nd, 3rd, 4th, 6th - pupils equal and reactive to light, visual fields full to confrontation, extraocular muscles intact, no nystagmus 5th - facial sensation symmetric 7th - facial strength --> SLIGHTLY DECR LEFT LOWER FACIAL STRENGTH 8th - hearing intact 9th - palate elevates symmetrically, uvula midline 11th - shoulder shrug symmetric 12th - tongue protrusion midline  MOTOR:  normal bulk and tone, full strength in the RUE, RLE INCREASED TONE ON LEFT SIDE --> LUE 1-2; LLE 2-3  SENSORY:  normal and symmetric to light touch, temperature, vibration  COORDINATION:  finger-nose-finger, fine finger movements normal  REFLEXES:  deep tendon reflexes 1+; SLIGHTLY INCREASED ON LEFT SIDE  GAIT/STATION:  USING CANE; MILD LET HEMIPARETIC GAIT     DIAGNOSTIC DATA (LABS, IMAGING, TESTING) - I reviewed patient records, labs, notes, testing and imaging myself where available.  Lab Results  Component Value Date    WBC 8.7 05/24/2021   HGB 14.6 05/24/2021   HCT 43.0 05/24/2021   MCV 88.0 05/24/2021   PLT 253 05/24/2021      Component Value Date/Time   NA 143 06/03/2022 1320   K 4.4 06/03/2022 1320   CL 106 06/03/2022 1320   CO2 22 06/03/2022 1320   GLUCOSE 105 (H) 06/03/2022 1320   GLUCOSE 87 05/24/2021 1641   BUN 17 06/03/2022 1320   CREATININE 1.93 (H) 06/03/2022 1320   CALCIUM 9.2 06/03/2022 1320   PROT 7.4 05/24/2021 1609   ALBUMIN 4.0 05/24/2021 1609   AST 26 05/24/2021 1609   ALT 42 05/24/2021 1609   ALKPHOS 144 (H) 05/24/2021 1609   BILITOT 0.4 05/24/2021 1609   GFRNONAA 48 (L) 05/24/2021 1609   Lab Results  Component Value Date   CHOL 101 05/25/2021   HDL  47 05/25/2021   LDLCALC 49 05/25/2021   TRIG 24 05/25/2021   CHOLHDL 2.1 05/25/2021   Lab Results  Component Value Date   HGBA1C 5.1 05/25/2021   No results found for: "VITAMINB12" Lab Results  Component Value Date   TSH 3.456 05/25/2021    05/24/21 MRI brain (without)  1. Decreased size of right thalamic intraparenchymal hemorrhage with minimal residual edema. 2. No acute ischemia. 3. Numerous chronic microhemorrhages in the cerebrum, brainstem and cerebellum, consistent with chronic hypertensive angiopathy. 4. Chronic ischemic microangiopathy and generalized volume loss.  05/24/21 EEG - This study is within normal limits. No seizures or epileptiform discharges were seen throughout the recording.    ASSESSMENT AND PLAN  57 y.o. year old male here with:   Dx:  1. Nontraumatic subcortical hemorrhage of right cerebral hemisphere (HCC)   2. TIA (transient ischemic attack)   3. Partial symptomatic epilepsy with simple partial seizures, not intractable, without status epilepticus (HCC)     PLAN:  Right basal ganglia hemorrhage (left sided weakness) - continue BP control  Left brain events (right sided weakness, aphasia, 3-4 minutes, 4 events; last events April 2024) - suspect left brain TIA; continue  aspirin 81mg  daily and BP control, statin  - possible left brain seizure, and now on levetiracetam 500mg  twice a day; continue levetiracetam for now; may consider to wean off after 2-3 years  - According to [place] law, you can not drive unless you are seizure / syncope free for at least 6 months and under physician's care.   - Please maintain precautions. Do not participate in activities where a loss of awareness could harm you or someone else. No swimming alone, no tub bathing, no hot tubs, no driving, no operating motorized vehicles (cars, ATVs, motocycles, etc), lawnmowers, power tools or firearms. No standing at heights, such as rooftops, ladders or stairs. Avoid hot objects such as stoves, heaters, open fires. Wear a helmet when riding a bicycle, scooter, skateboard, etc. and avoid areas of traffic. Set your water heater to 120 degrees or less.   No orders of the defined types were placed in this encounter.  No follow-ups on file.   Virtual Visit via Video Note  I connected with Miguel Mccullough on 06/22/22 at  1:30 PM EDT by a video enabled telemedicine application and verified that I am speaking with the correct person using two identifiers.   I discussed the limitations of evaluation and management by telemedicine and the availability of in person appointments. The patient expressed understanding and agreed to proceed.  Patient is at home and I am at the office.   I spent 20 minutes of face-to-face and non-face-to-face time with patient.  This included previsit chart review, lab review, study review, order entry, electronic health record documentation, patient education.     Suanne Marker, MD 06/22/2022, 1:22 PM Certified in Neurology, Neurophysiology and Neuroimaging  [facility] [address], [address] [place], [place] [address] [phone]

## 2022-06-26 ENCOUNTER — Ambulatory Visit: Payer: Self-pay | Attending: Cardiovascular Disease

## 2022-06-29 ENCOUNTER — Other Ambulatory Visit: Payer: Self-pay

## 2022-06-29 ENCOUNTER — Encounter: Payer: Self-pay | Admitting: Cardiovascular Disease

## 2022-07-03 ENCOUNTER — Other Ambulatory Visit: Payer: Self-pay

## 2022-07-06 ENCOUNTER — Other Ambulatory Visit: Payer: Self-pay

## 2022-07-10 ENCOUNTER — Other Ambulatory Visit: Payer: Self-pay

## 2022-07-19 ENCOUNTER — Other Ambulatory Visit: Payer: Self-pay | Admitting: Internal Medicine

## 2022-07-19 DIAGNOSIS — I10 Essential (primary) hypertension: Secondary | ICD-10-CM

## 2022-07-20 ENCOUNTER — Other Ambulatory Visit: Payer: Self-pay

## 2022-07-21 ENCOUNTER — Other Ambulatory Visit: Payer: Self-pay

## 2022-07-21 MED ORDER — METOPROLOL TARTRATE 50 MG PO TABS
75.0000 mg | ORAL_TABLET | Freq: Two times a day (BID) | ORAL | 2 refills | Status: DC
Start: 2022-07-21 — End: 2022-09-18
  Filled 2022-07-21: qty 90, 30d supply, fill #0
  Filled 2022-08-25 – 2022-08-26 (×2): qty 90, 30d supply, fill #1
  Filled 2022-09-17: qty 90, 30d supply, fill #2

## 2022-07-21 NOTE — Telephone Encounter (Signed)
Requested Prescriptions  Pending Prescriptions Disp Refills   metoprolol tartrate (LOPRESSOR) 50 MG tablet 90 tablet 6    Sig: Take 1 & 1/2 tablets (75 mg total) by mouth 2 (two) times daily.     Cardiovascular:  Beta Blockers Failed - 07/19/2022 11:14 PM      Failed - Last BP in normal range    BP Readings from Last 1 Encounters:  05/20/22 (!) 155/90         Passed - Last Heart Rate in normal range    Pulse Readings from Last 1 Encounters:  05/20/22 76         Passed - Valid encounter within last 6 months    Recent Outpatient Visits           3 months ago Essential hypertension   [place] [facility] Marcine Matar, MD   7 months ago Essential hypertension   [place] [facility] Marcine Matar, MD   8 months ago Essential hypertension   [place] [facility] Marcine Matar, MD   11 months ago Essential hypertension    [facility] Marcine Matar, MD   1 year ago Establishing care with new doctor, encounter for   [facility] Marcine Matar, MD       Future Appointments             In 3 weeks Marcine Matar, MD [facility]

## 2022-07-23 ENCOUNTER — Other Ambulatory Visit: Payer: Self-pay

## 2022-07-27 ENCOUNTER — Ambulatory Visit (INDEPENDENT_AMBULATORY_CARE_PROVIDER_SITE_OTHER): Payer: Self-pay

## 2022-07-27 DIAGNOSIS — I1A0 Resistant hypertension: Secondary | ICD-10-CM

## 2022-07-28 ENCOUNTER — Other Ambulatory Visit: Payer: Self-pay

## 2022-07-28 ENCOUNTER — Other Ambulatory Visit: Payer: Self-pay | Admitting: Internal Medicine

## 2022-07-28 DIAGNOSIS — I6930 Unspecified sequelae of cerebral infarction: Secondary | ICD-10-CM

## 2022-07-28 MED ORDER — BACLOFEN 10 MG PO TABS
10.0000 mg | ORAL_TABLET | Freq: Three times a day (TID) | ORAL | 0 refills | Status: DC
Start: 2022-07-28 — End: 2022-08-25
  Filled 2022-07-28 – 2022-08-04 (×2): qty 90, 30d supply, fill #0

## 2022-08-04 ENCOUNTER — Other Ambulatory Visit: Payer: Self-pay

## 2022-08-06 ENCOUNTER — Other Ambulatory Visit: Payer: Self-pay | Admitting: Internal Medicine

## 2022-08-06 ENCOUNTER — Other Ambulatory Visit: Payer: Self-pay

## 2022-08-06 DIAGNOSIS — I6930 Unspecified sequelae of cerebral infarction: Secondary | ICD-10-CM

## 2022-08-07 ENCOUNTER — Other Ambulatory Visit: Payer: Self-pay

## 2022-08-07 MED ORDER — MAGNESIUM OXIDE 400 MG PO TABS
400.0000 mg | ORAL_TABLET | Freq: Every day | ORAL | 0 refills | Status: DC
  Filled 2022-08-07: qty 90, 90d supply, fill #0

## 2022-08-07 MED ORDER — ATORVASTATIN CALCIUM 40 MG PO TABS
40.0000 mg | ORAL_TABLET | Freq: Every day | ORAL | 0 refills | Status: DC
Start: 2022-08-07 — End: 2022-09-17
  Filled 2022-08-07: qty 90, 90d supply, fill #0

## 2022-08-07 NOTE — Telephone Encounter (Signed)
Requested Prescriptions  Pending Prescriptions Disp Refills   atorvastatin (LIPITOR) 40 MG tablet 90 tablet 0    Sig: Take 1 tablet (40 mg total) by mouth at bedtime.     Cardiovascular:  Antilipid - Statins Failed - 08/06/2022 10:35 PM      Failed - Lipid Panel in normal range within the last 12 months    Cholesterol  Date Value Ref Range Status  05/25/2021 101 0 - 200 mg/dL Final   LDL Cholesterol  Date Value Ref Range Status  05/25/2021 49 0 - 99 mg/dL Final    Comment:           Total Cholesterol/HDL:CHD Risk Coronary Heart Disease Risk Table                     Men   Women  1/2 Average Risk   3.4   3.3  Average Risk       5.0   4.4  2 X Average Risk   9.6   7.1  3 X Average Risk  23.4   11.0        Use the calculated Patient Ratio above and the CHD Risk Table to determine the patient's CHD Risk.        ATP III CLASSIFICATION (LDL):  <100     mg/dL   Optimal  865-784  mg/dL   Near or Above                    Optimal  130-159  mg/dL   Borderline  696-295  mg/dL   High  >284     mg/dL   Very High Performed at [facility], [address] [place], [place] [address]    HDL  Date Value Ref Range Status  05/25/2021 47 >40 mg/dL Final   Triglycerides  Date Value Ref Range Status  05/25/2021 24 <150 mg/dL Final         Passed - Patient is not pregnant      Passed - Valid encounter within last 12 months    Recent Outpatient Visits           3 months ago Essential hypertension   [place] [facility] Marcine Matar, MD   8 months ago Essential hypertension   [place] [facility] Marcine Matar, MD   8 months ago Essential hypertension   [place] [facility] Marcine Matar, MD   1 year ago Essential hypertension   [place] Marcine Matar, MD   1 year ago Establishing care with new doctor, encounter for   [facility] Marcine Matar, MD       Future Appointments             In 6 days Marcine Matar, MD [place] [facility]             magnesium oxide (MAG-OX) 400 MG tablet 90 tablet 0    Sig: Take 1 tablet (400 mg total) by mouth daily.     Endocrinology:  Minerals - Magnesium Supplementation Failed - 08/06/2022 10:35 PM      Failed - Mg Level in normal range and within 360 days    Magnesium  Date Value Ref Range Status  05/13/2021 1.7 1.7 - 2.4 mg/dL Final    Comment:  Performed at [facility], [address] [place], [place] [address]         Failed - Cr in normal range and within 360 days    Creatinine, Ser  Date Value Ref Range Status  06/03/2022 1.93 (H) 0.76 - 1.27 mg/dL Final   Creatinine, Urine  Date Value Ref Range Status  04/17/2021 130.57 mg/dL Final         Passed - Valid encounter within last 12 months    Recent Outpatient Visits           3 months ago Essential hypertension   [facility] Marcine Matar, MD   8 months ago Essential hypertension   [place] [facility] Marcine Matar, MD   8 months ago Essential hypertension   [place] Marcine Matar, MD   1 year ago Essential hypertension   [place] [facility] Marcine Matar, MD   1 year ago Establishing care with new doctor, encounter for   [facility] Marcine Matar, MD       Future Appointments             In 6 days Marcine Matar, MD [facility]

## 2022-08-10 NOTE — Addendum Note (Signed)
Addended by: Brunetta Genera on: 08/10/2022 04:41 PM   Modules accepted: Orders

## 2022-08-13 ENCOUNTER — Other Ambulatory Visit: Payer: Self-pay

## 2022-08-13 ENCOUNTER — Ambulatory Visit: Payer: Self-pay | Admitting: Internal Medicine

## 2022-08-25 ENCOUNTER — Other Ambulatory Visit: Payer: Self-pay | Admitting: Internal Medicine

## 2022-08-25 DIAGNOSIS — I6930 Unspecified sequelae of cerebral infarction: Secondary | ICD-10-CM

## 2022-08-26 ENCOUNTER — Other Ambulatory Visit: Payer: Self-pay

## 2022-08-26 MED ORDER — BACLOFEN 10 MG PO TABS
10.0000 mg | ORAL_TABLET | Freq: Three times a day (TID) | ORAL | 0 refills | Status: DC
Start: 2022-08-26 — End: 2022-09-17
  Filled 2022-08-26: qty 270, 90d supply, fill #0

## 2022-08-26 NOTE — Telephone Encounter (Signed)
Requested Prescriptions  Pending Prescriptions Disp Refills   baclofen (LIORESAL) 10 MG tablet 90 each 0    Sig: Take 1 tablet (10 mg total) by mouth 3 (three) times daily.     Analgesics:  Muscle Relaxants - baclofen Failed - 08/25/2022 11:12 PM      Failed - Cr in normal range and within 180 days    Creatinine, Ser  Date Value Ref Range Status  06/03/2022 1.93 (H) 0.76 - 1.27 mg/dL Final   Creatinine, Urine  Date Value Ref Range Status  04/17/2021 130.57 mg/dL Final         Passed - eGFR is 30 or above and within 180 days    GFR, Estimated  Date Value Ref Range Status  05/24/2021 48 (L) >60 mL/min Final    Comment:    (NOTE) Calculated using the CKD-EPI Creatinine Equation (2021)    eGFR  Date Value Ref Range Status  06/03/2022 41 (L) >59 mL/min/1.73 Final         Passed - Valid encounter within last 6 months    Recent Outpatient Visits           4 months ago Essential hypertension   [place] [facility] Marcine Matar, MD   8 months ago Essential hypertension   [place] [facility] Marcine Matar, MD   9 months ago Essential hypertension   [place] [facility] Marcine Matar, MD   1 year ago Essential hypertension   [place] [facility] Marcine Matar, MD   1 year ago Establishing care with new doctor, encounter for   [facility] Marcine Matar, MD       Future Appointments             In 2 months Marcine Matar, MD [facility]

## 2022-08-31 ENCOUNTER — Other Ambulatory Visit: Payer: Self-pay

## 2022-09-01 ENCOUNTER — Other Ambulatory Visit: Payer: Self-pay

## 2022-09-02 ENCOUNTER — Other Ambulatory Visit: Payer: Self-pay

## 2022-09-17 ENCOUNTER — Other Ambulatory Visit: Payer: Self-pay | Admitting: Internal Medicine

## 2022-09-17 ENCOUNTER — Other Ambulatory Visit: Payer: Self-pay | Admitting: Physical Medicine and Rehabilitation

## 2022-09-17 DIAGNOSIS — I10 Essential (primary) hypertension: Secondary | ICD-10-CM

## 2022-09-17 DIAGNOSIS — I6930 Unspecified sequelae of cerebral infarction: Secondary | ICD-10-CM

## 2022-09-17 DIAGNOSIS — F320 Major depressive disorder, single episode, mild: Secondary | ICD-10-CM

## 2022-09-18 ENCOUNTER — Ambulatory Visit (INDEPENDENT_AMBULATORY_CARE_PROVIDER_SITE_OTHER): Payer: Self-pay | Admitting: Internal Medicine

## 2022-09-18 ENCOUNTER — Other Ambulatory Visit: Payer: Self-pay

## 2022-09-18 ENCOUNTER — Encounter: Payer: Self-pay | Admitting: Internal Medicine

## 2022-09-18 DIAGNOSIS — I10 Essential (primary) hypertension: Secondary | ICD-10-CM

## 2022-09-18 MED ORDER — VALSARTAN 80 MG PO TABS
80.0000 mg | ORAL_TABLET | Freq: Every day | ORAL | 3 refills | Status: DC
  Filled 2022-09-18 – 2022-11-11 (×2): qty 90, 90d supply, fill #0
  Filled 2023-01-26: qty 90, 90d supply, fill #1
  Filled 2023-05-11 – 2023-05-12 (×2): qty 90, 90d supply, fill #2
  Filled 2023-07-29 – 2023-08-05 (×3): qty 90, 90d supply, fill #3

## 2022-09-18 MED ORDER — ATORVASTATIN CALCIUM 40 MG PO TABS
40.0000 mg | ORAL_TABLET | Freq: Every day | ORAL | 1 refills | Status: DC
Start: 2022-09-18 — End: 2022-12-31
  Filled 2022-09-18 – 2022-10-29 (×2): qty 30, 30d supply, fill #0
  Filled 2022-12-10 (×2): qty 30, 30d supply, fill #1

## 2022-09-18 MED ORDER — MAGNESIUM OXIDE 400 MG PO TABS
400.0000 mg | ORAL_TABLET | Freq: Every day | ORAL | 1 refills | Status: DC
  Filled 2022-09-18 – 2022-12-10 (×2): qty 30, 30d supply, fill #0
  Filled 2023-01-18: qty 30, 30d supply, fill #1

## 2022-09-18 MED ORDER — EPLERENONE 50 MG PO TABS
50.0000 mg | ORAL_TABLET | Freq: Every day | ORAL | 3 refills | Status: DC
  Filled 2022-09-18: qty 90, 90d supply, fill #0
  Filled 2022-09-26: qty 30, 30d supply, fill #0
  Filled 2022-10-22 – 2022-10-23 (×2): qty 30, 30d supply, fill #1
  Filled 2022-12-10 (×2): qty 30, 30d supply, fill #2

## 2022-09-18 MED ORDER — AMANTADINE HCL 100 MG PO CAPS
100.0000 mg | ORAL_CAPSULE | Freq: Every day | ORAL | 5 refills | Status: DC
  Filled 2022-09-18 – 2022-10-29 (×2): qty 30, 30d supply, fill #0
  Filled 2022-12-10 (×2): qty 30, 30d supply, fill #1
  Filled 2023-01-18: qty 30, 30d supply, fill #2
  Filled 2023-02-17 – 2023-02-18 (×2): qty 30, 30d supply, fill #3
  Filled 2023-03-17 – 2023-03-18 (×2): qty 30, 30d supply, fill #4
  Filled 2023-04-20: qty 30, 30d supply, fill #5

## 2022-09-18 MED ORDER — AMLODIPINE BESYLATE 10 MG PO TABS
10.0000 mg | ORAL_TABLET | Freq: Every day | ORAL | 3 refills | Status: DC
  Filled 2022-09-18 – 2022-09-27 (×2): qty 90, 90d supply, fill #0
  Filled 2022-10-13 – 2022-12-31 (×3): qty 90, 90d supply, fill #1
  Filled 2023-01-01: qty 30, 30d supply, fill #1
  Filled 2023-01-18: qty 30, 30d supply, fill #2
  Filled 2023-02-28 – 2023-03-03 (×2): qty 30, 30d supply, fill #3
  Filled 2023-03-31 – 2023-04-01 (×2): qty 30, 30d supply, fill #4
  Filled 2023-05-04: qty 30, 30d supply, fill #5
  Filled 2023-06-08 – 2023-06-09 (×2): qty 30, 30d supply, fill #6
  Filled 2023-07-05: qty 30, 30d supply, fill #7
  Filled 2023-07-29 – 2023-08-05 (×2): qty 30, 30d supply, fill #8
  Filled 2023-09-05: qty 30, 30d supply, fill #9

## 2022-09-18 MED ORDER — HYDRALAZINE HCL 100 MG PO TABS
100.0000 mg | ORAL_TABLET | Freq: Three times a day (TID) | ORAL | 3 refills | Status: DC
Start: 2022-09-18 — End: 2023-10-08
  Filled 2022-09-18: qty 270, 90d supply, fill #0
  Filled 2022-10-13 – 2022-10-14 (×2): qty 90, 30d supply, fill #0
  Filled 2022-11-11: qty 90, 30d supply, fill #1
  Filled 2022-12-18 – 2023-01-01 (×3): qty 90, 30d supply, fill #2
  Filled 2023-01-18 – 2023-01-26 (×3): qty 90, 30d supply, fill #3
  Filled 2023-02-28 – 2023-03-03 (×2): qty 90, 30d supply, fill #4
  Filled 2023-03-31 – 2023-04-01 (×2): qty 90, 30d supply, fill #5
  Filled 2023-04-20 – 2023-05-04 (×2): qty 90, 30d supply, fill #6
  Filled 2023-05-30 – 2023-06-10 (×2): qty 90, 30d supply, fill #7
  Filled 2023-07-05: qty 90, 30d supply, fill #8
  Filled 2023-07-29 – 2023-08-05 (×2): qty 90, 30d supply, fill #9
  Filled 2023-09-05: qty 90, 30d supply, fill #10

## 2022-09-18 MED ORDER — AMLODIPINE BESYLATE 10 MG PO TABS
10.0000 mg | ORAL_TABLET | Freq: Every day | ORAL | 1 refills | Status: DC
Start: 2022-09-18 — End: 2022-09-18
  Filled 2022-09-18: qty 30, 30d supply, fill #0

## 2022-09-18 MED ORDER — CITALOPRAM HYDROBROMIDE 20 MG PO TABS
20.0000 mg | ORAL_TABLET | Freq: Every day | ORAL | 1 refills | Status: DC
Start: 2022-09-18 — End: 2022-12-31
  Filled 2022-09-18 – 2022-10-29 (×2): qty 30, 30d supply, fill #0
  Filled 2022-12-10 (×2): qty 30, 30d supply, fill #1

## 2022-09-18 MED ORDER — BACLOFEN 10 MG PO TABS
10.0000 mg | ORAL_TABLET | Freq: Three times a day (TID) | ORAL | 1 refills | Status: DC
Start: 2022-09-18 — End: 2023-02-17
  Filled 2022-09-18 – 2022-12-10 (×2): qty 90, 30d supply, fill #0
  Filled 2023-01-18: qty 90, 30d supply, fill #1

## 2022-09-18 MED ORDER — METOPROLOL TARTRATE 50 MG PO TABS
75.0000 mg | ORAL_TABLET | Freq: Two times a day (BID) | ORAL | 2 refills | Status: DC
Start: 2022-09-18 — End: 2023-07-05
  Filled 2022-09-18 – 2022-09-27 (×2): qty 270, 90d supply, fill #0
  Filled 2022-12-31: qty 270, 90d supply, fill #1
  Filled 2023-01-01: qty 90, 30d supply, fill #1
  Filled 2023-01-18 – 2023-01-26 (×2): qty 90, 30d supply, fill #2
  Filled 2023-02-28 – 2023-03-03 (×2): qty 90, 30d supply, fill #3
  Filled 2023-03-31 – 2023-04-01 (×2): qty 90, 30d supply, fill #4
  Filled 2023-05-04 (×2): qty 90, 30d supply, fill #5
  Filled 2023-06-08 – 2023-06-09 (×2): qty 90, 30d supply, fill #6

## 2022-09-18 MED ORDER — CLONIDINE 0.2 MG/24HR TD PTWK
0.2000 mg | MEDICATED_PATCH | TRANSDERMAL | 12 refills | Status: DC
  Filled 2022-09-18: qty 4, 28d supply, fill #0
  Filled 2022-10-13 – 2022-10-14 (×2): qty 4, 28d supply, fill #1
  Filled 2022-11-11: qty 4, 28d supply, fill #2
  Filled 2022-12-13 – 2022-12-14 (×2): qty 4, 28d supply, fill #3
  Filled 2022-12-31: qty 4, 28d supply, fill #4
  Filled 2023-01-31: qty 4, 28d supply, fill #5
  Filled 2023-03-17 – 2023-03-18 (×2): qty 4, 28d supply, fill #6
  Filled 2023-04-20: qty 4, 28d supply, fill #7
  Filled 2023-05-18: qty 4, 28d supply, fill #8
  Filled 2023-06-17 – 2023-06-18 (×2): qty 4, 28d supply, fill #9
  Filled 2023-07-20 – 2023-07-21 (×2): qty 4, 28d supply, fill #10
  Filled 2023-08-14 – 2023-08-17 (×3): qty 4, 28d supply, fill #11
  Filled 2023-09-18: qty 4, 28d supply, fill #12

## 2022-09-18 MED ORDER — TRAZODONE HCL 150 MG PO TABS
75.0000 mg | ORAL_TABLET | Freq: Every day | ORAL | 1 refills | Status: DC
Start: 2022-09-18 — End: 2022-11-11
  Filled 2022-09-18 – 2022-09-27 (×2): qty 15, 30d supply, fill #0
  Filled 2022-10-28: qty 15, 30d supply, fill #1

## 2022-09-18 NOTE — Progress Notes (Unsigned)
Name: Miguel Mccullough  MRN/ DOB: [id number], 1965/11/04    Age/ Sex: 57 y.o., male    PCP: Marcine Matar, MD   Reason for Endocrinology Evaluation: Hyperaldosteronism     Date of Initial Endocrinology Evaluation: 09/18/2022     HPI: [name] is a 57 y.o. male with a past medical history of HTN, CKD, CVA. The patient presented for initial endocrinology clinic visit on 09/18/2022 for consultative assistance with his hyperaldosteronism.   During evaluation for resistant hypertension patient has been noted with elevated Aldo/renin ratio in April 2023At 55.8, renin normal 0.217 NG/mL/HR, and normal aldosterone at 12.1 NG/DL   Accompanied by  wife stephanie  Has left sided weakness secondary to hemorrhagic stroke 20 2023  He was diagnosed wit HTN late 20's  Denies headaches or vision changes  Denies chest pain  Has occasional left LE swelling  Has minimal left sided tremors  Weight has been stable  Has hx of hypokalemia    Mother and father with DM    Diovan 80 mg daily Hydralazine 100 mg 3 times daily Eplerenone 25 mg daily Amlodipine 10 mg daily Metoprolol 50 mg, 1.5 tabs  BID Clonidine 0.3 mg patch every Friday      HISTORY:  Past Medical History:  Past Medical History:  Diagnosis Date   COVID-19 2021   continues to have increase in suptum production.   Hypertension    TIA (transient ischemic attack)    Past Surgical History: No past surgical history on file.  Social History:  reports that he has never smoked. He has never used smokeless tobacco. He reports that he does not currently use alcohol. He reports that he does not use drugs. Family History: family history includes Hypertension in his brother, father, mother, and sister.   HOME MEDICATIONS: Allergies as of 09/18/2022       Reactions   Spironolactone         Medication List        Accurate as of September 18, 2022  2:31 PM. If you have any questions, ask your nurse or doctor.           STOP taking these medications    cloNIDine 0.3 mg/24hr patch Commonly known as: CATAPRES - Dosed in mg/24 hr Replaced by: cloNIDine 0.2 mg/24hr patch Stopped by: Johnney Ou        TAKE these medications    amantadine 100 MG capsule Commonly known as: SYMMETREL Take 1 capsule (100 mg total) by mouth daily.   amLODipine 10 MG tablet Commonly known as: NORVASC Take 1 tablet (10 mg total) by mouth at bedtime.   atorvastatin 40 MG tablet Commonly known as: LIPITOR Take 1 tablet (40 mg total) by mouth at bedtime.   baclofen 10 MG tablet Commonly known as: LIORESAL Take 1 tablet (10 mg total) by mouth 3 (three) times daily.   citalopram 20 MG tablet Commonly known as: CELEXA Take 1 tablet (20 mg total) by mouth daily.   cloNIDine 0.2 mg/24hr patch Commonly known as: CATAPRES - Dosed in mg/24 hr Place 1 patch (0.2 mg total) onto the skin once a week. Replaces: cloNIDine 0.3 mg/24hr patch Started by: Johnney Ou    eplerenone 50 MG tablet Commonly known as: INSPRA Take 1 tablet (50 mg total) by mouth daily. What changed:  medication strength how much to take Changed by: Johnney Ou    hydrALAZINE 100 MG tablet Commonly known as: APRESOLINE Take 1 tablet (100 mg  total) by mouth every 8 (eight) hours.   levETIRAcetam 500 MG tablet Commonly known as: KEPPRA Take 1 tablet (500 mg total) by mouth 2 (two) times daily.   magnesium oxide 400 MG tablet Commonly known as: MAG-OX Take 1 tablet (400 mg total) by mouth daily.   metoprolol tartrate 50 MG tablet Commonly known as: LOPRESSOR Take 1 & 1/2 tablets (75 mg total) by mouth 2 (two) times daily.   pantoprazole 40 MG tablet Commonly known as: PROTONIX Take 1 tablet (40 mg total) by mouth at bedtime.   Stimulant Laxative 8.6-50 MG tablet Generic drug: senna-docusate Take 2 tablets by mouth at bedtime as needed for mild constipation. What changed:  when to take this additional  instructions   tiZANidine 4 MG tablet Commonly known as: Zanaflex Take 1 tablet (4 mg total) by mouth every 8 (eight) hours as needed for muscle spasms (spasticity/tightness).   traZODone 150 MG tablet Commonly known as: DESYREL Take 0.5 tablets (75 mg total) by mouth at bedtime.   valsartan 80 MG tablet Commonly known as: Diovan Take 1 tablet (80 mg total) by mouth daily.          REVIEW OF SYSTEMS: A comprehensive ROS was conducted with the patient and is negative except as per HPI    OBJECTIVE:  VS: BP (!) 122/8 (BP Location: Left Arm, Patient Position: Sitting, Cuff Size: Large)   Pulse 70   Ht 5\' 4"  (1.626 m)   Wt 200 lb 3.2 oz (90.8 kg)   SpO2 96%   BMI 34.36 kg/m    Wt Readings from Last 3 Encounters:  09/18/22 200 lb 3.2 oz (90.8 kg)  05/20/22 200 lb 9.6 oz (91 kg)  04/13/22 197 lb (89.4 kg)     EXAM: General: Pt appears well and is in NAD  Neck: General: Supple without adenopathy. Thyroid: Thyroid size normal.  No goiter or nodules appreciated.   Lungs: Clear with good BS bilat   Heart: Auscultation: RRR.  Abdomen: Soft, nontender  Extremities:  BL [id number]  pretibial edema   Mental Status: Judgment, insight: Intact Orientation: Oriented to time, place, and person Mood and affect: No depression, anxiety, or agitation     DATA REVIEWED: ***    Old records , labs and images have been reviewed.    ASSESSMENT/PLAN/RECOMMENDATIONS:   Resistant hypertension:  -Patient with abnormal Aldo/PRA ratio, we discussed that the patient had abnormal screening test, we discussed that typically confirmation is the neck step with either salt or saline loading, this is to be performed after being off eplerenone for approximately 6 weeks.  Given that the patient had a hemorrhagic stroke last year and the risk of elevated BP during this process, we opted to treat the patient medically at this time and avoid invasive procedures.  -I have recommended increasing  eplerenone as below, and decreasing clonidine with the goal to wean off clonidine gradually -Will also proceed with pheochromocytoma and Cushing syndrome screening with 24-hour urinary cortisol  Medications : Stop clonidine 0.2 mg patch Start clonidine 0.2 mg patch weekly Increase eplerenone 50 mg daily Continue Diovan 80 mg daily Continue hydralazine 100 mg 3 times daily Continue metoprolol 50 mg, 1.5 tablets twice daily Continue amlodipine 10 mg daily      Signed electronically by: Lyndle Herrlich, MD  [facility]  [facility] [address] [place] [place], [place] [address] Phone: [phone] FAX: [phone]   CC: Marcine Matar, MD [address] [place]  [address] [place] [address] Phone: [phone] Fax: [phone]   Return to Endocrinology clinic as below: Future Appointments  Date Time Provider Department Center  11/05/2022  4:10 PM Marcine Matar, MD [id number] None

## 2022-09-18 NOTE — Patient Instructions (Addendum)
Stop clonidine 0.3 mg patch Start clonidine 0.2 patch weekly Increase eplerenone to 50 mg, 1 tablet daily Continue Diovan 80 mg daily Continue hydralazine 100 mg, 1 tablet 3 times a day Continue metoprolol 50 mg, 1.5 tablets twice daily Continue amlodipine 10 mg daily   24-Hour Urine Collection  You will be collecting your urine for a 24-hour period of time. Your timer starts with your first urine of the morning (For example - If you first pee at 9AM, your timer will start at 9AM) Throw away your first urine of the morning Collect your urine every time you pee for the next 24 hours STOP your urine collection 24 hours after you started the collection (For example - You would stop at 9AM the day after you started)

## 2022-09-21 ENCOUNTER — Other Ambulatory Visit: Payer: Self-pay

## 2022-09-27 ENCOUNTER — Other Ambulatory Visit: Payer: Self-pay

## 2022-09-28 ENCOUNTER — Other Ambulatory Visit: Payer: Self-pay

## 2022-10-14 ENCOUNTER — Other Ambulatory Visit: Payer: Self-pay

## 2022-10-15 ENCOUNTER — Other Ambulatory Visit: Payer: Self-pay

## 2022-10-23 ENCOUNTER — Other Ambulatory Visit: Payer: Self-pay

## 2022-10-28 ENCOUNTER — Other Ambulatory Visit: Payer: Self-pay

## 2022-10-29 ENCOUNTER — Other Ambulatory Visit: Payer: Self-pay

## 2022-10-30 ENCOUNTER — Encounter: Payer: Self-pay | Admitting: Pharmacist

## 2022-11-03 ENCOUNTER — Other Ambulatory Visit: Payer: Self-pay

## 2022-11-05 ENCOUNTER — Ambulatory Visit: Payer: Self-pay | Attending: Internal Medicine | Admitting: Internal Medicine

## 2022-11-05 ENCOUNTER — Telehealth: Payer: Self-pay | Admitting: Internal Medicine

## 2022-11-05 ENCOUNTER — Encounter: Payer: Self-pay | Admitting: Internal Medicine

## 2022-11-05 VITALS — BP 141/85 | HR 60 | Temp 98.2°F | Ht 64.0 in | Wt 200.0 lb

## 2022-11-05 DIAGNOSIS — N1832 Chronic kidney disease, stage 3b: Secondary | ICD-10-CM

## 2022-11-05 DIAGNOSIS — I6930 Unspecified sequelae of cerebral infarction: Secondary | ICD-10-CM

## 2022-11-05 DIAGNOSIS — F320 Major depressive disorder, single episode, mild: Secondary | ICD-10-CM

## 2022-11-05 DIAGNOSIS — Z2821 Immunization not carried out because of patient refusal: Secondary | ICD-10-CM

## 2022-11-05 DIAGNOSIS — I1A0 Resistant hypertension: Secondary | ICD-10-CM

## 2022-11-05 DIAGNOSIS — E269 Hyperaldosteronism, unspecified: Secondary | ICD-10-CM

## 2022-11-05 DIAGNOSIS — G40909 Epilepsy, unspecified, not intractable, without status epilepticus: Secondary | ICD-10-CM

## 2022-11-05 NOTE — Progress Notes (Signed)
Patient ID: Miguel Mccullough, male    DOB: Mar 08, 1965  MRN: [id number]  CC: Hypertension (HTN f/u./No question/ concerns. /No to flu vax.  No to shingles vax. No to colonoscopy referral. )   Subjective: Miguel Mccullough is a 57 y.o. male who presents for chronic ds management.  Wife is with him. 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 Marjory Lies 06/18/2021.  Started on Keppra).      HTN/hyperaldosterone: Last visit with me, he has seen Duke Salvia in hypertension clinic.  Spironolactone was discontinued and changed to Eplerenone.  Endocrinologist is trying to wean him off the clonidine patch so the dose was decreased to 0.2 mg and Eplerenone was increased to 50 mg daily. Otherwise, he is still on  amlodipine 10 mg daily, hydralazine 100 mg 3 times a day, weekly Clonidine patch and metoprolol 75 mg twice a day.  Checking blood pressure daily.  Some of his recent readings have been 128/79 and 134/81.  History of seizure/CVA: Saw the neurologist recently.  He was left on Keppra for now.  He has completed PT through [facility] physical therapy program.  Ambulating with cane.  No recent falls.  Positive depression screen: Patient states this is due to his chronic medical conditions.  He is still waiting to be approved for [employer].  Because his wife makes above the threshold.  CKD 3: Had chemistry done recently with the endocrinologist but GFR was not included.  HM: He declines flu and shingrix shot.  Would like to hold off on all forms of colon cancer screening until he gets insurance.  Patient Active Problem List   Diagnosis Date Noted   Neglect of one side of body 06/13/2021   Abnormality of gait 06/13/2021   AMS (altered mental status) 05/24/2021   Aphasia 05/24/2021   History of intracranial hemorrhage 05/24/2021   Resistant hypertension 05/24/2021   Stage 3b chronic kidney  disease (HCC) 05/20/2021   Hypokalemia 05/20/2021   Mild major depression (HCC) 05/20/2021   Acute renal failure (HCC) 05/15/2021   Elevated lipids 05/15/2021   Spasticity 05/15/2021   Depression 05/15/2021   Constipation 05/15/2021   ICH (intracerebral hemorrhage) (HCC) 04/13/2021   Hypercholesterolemia 07/08/2017   Essential hypertension 02/20/2016     Current Outpatient Medications on File Prior to Visit  Medication Sig Dispense Refill   amantadine (SYMMETREL) 100 MG capsule Take 1 capsule (100 mg total) by mouth daily. 30 capsule 5   amLODipine (NORVASC) 10 MG tablet Take 1 tablet (10 mg total) by mouth at bedtime. 90 tablet 3   aspirin EC 81 MG tablet Take 81 mg by mouth daily. Swallow whole.     atorvastatin (LIPITOR) 40 MG tablet Take 1 tablet (40 mg total) by mouth at bedtime. 30 tablet 1   b complex vitamins capsule Take 1 capsule by mouth daily.     baclofen (LIORESAL) 10 MG tablet Take 1 tablet (10 mg total) by mouth 3 (three) times daily. 90 tablet 1   citalopram (CELEXA) 20 MG tablet Take 1 tablet (20 mg total) by mouth daily. 30 tablet 1   cloNIDine (CATAPRES - DOSED IN MG/24 HR) 0.2 mg/24hr patch Place 1 patch (0.2 mg total) onto the skin once a week. 4 patch 12   eplerenone (INSPRA) 50 MG tablet Take 1 tablet (50 mg total) by mouth daily. 90 tablet 3  hydrALAZINE (APRESOLINE) 100 MG tablet Take 1 tablet (100 mg total) by mouth every 8 (eight) hours. 270 tablet 3   levETIRAcetam (KEPPRA) 500 MG tablet Take 1 tablet (500 mg total) by mouth 2 (two) times daily. 180 tablet 4   magnesium oxide (MAG-OX) 400 MG tablet Take 1 tablet (400 mg total) by mouth daily. 30 tablet 1   metoprolol tartrate (LOPRESSOR) 50 MG tablet Take 1 & 1/2 tablets (75 mg total) by mouth 2 (two) times daily. 270 tablet 2   pantoprazole (PROTONIX) 40 MG tablet Take 1 tablet (40 mg total) by mouth at bedtime. 90 tablet 3   senna-docusate (SENOKOT-S) 8.6-50 MG tablet Take 2 tablets by mouth at bedtime as  needed for mild constipation. (Patient taking differently: Take 2 tablets by mouth See admin instructions. Take 2 tablets by mouth every other night) 100 tablet 3   tiZANidine (ZANAFLEX) 4 MG tablet Take 1 tablet (4 mg total) by mouth every 8 (eight) hours as needed for muscle spasms (spasticity/tightness). 90 tablet 5   traZODone (DESYREL) 150 MG tablet Take 0.5 tablets (75 mg total) by mouth at bedtime. 15 tablet 1   valsartan (DIOVAN) 80 MG tablet Take 1 tablet (80 mg total) by mouth daily. 90 tablet 3   No current facility-administered medications on file prior to visit.    Allergies  Allergen Reactions   Spironolactone     Social History   Socioeconomic History   Marital status: Married    Spouse name: Judeth Cornfield   Number of children: 1   Years of education: Not on file   Highest education level: 12th grade  Occupational History   Not on file  Tobacco Use   Smoking status: Never   Smokeless tobacco: Never  Vaping Use   Vaping status: Never Used  Substance and Sexual Activity   Alcohol use: Not Currently   Drug use: Never   Sexual activity: Not Currently  Other Topics Concern   Not on file  Social History Narrative   Lives with wife   Social Determinants of Health   Financial Resource Strain: High Risk (11/04/2022)   Overall Financial Resource Strain (CARDIA)    Difficulty of Paying Living Expenses: Very hard  Food Insecurity: No Food Insecurity (11/04/2022)   Hunger Vital Sign    Worried About Running Out of Food in the Last Year: Never true    Ran Out of Food in the Last Year: Never true  Transportation Needs: No Transportation Needs (11/04/2022)   PRAPARE - Administrator, Civil Service (Medical): No    Lack of Transportation (Non-Medical): No  Physical Activity: Insufficiently Active (11/04/2022)   Exercise Vital Sign    Days of Exercise per Week: 2 days    Minutes of Exercise per Session: 20 min  Stress: No Stress Concern Present (11/04/2022)    [employer] of Occupational Health - Occupational Stress Questionnaire    Feeling of Stress : Only a little  Social Connections: Moderately Integrated (11/04/2022)   Social Connection and Isolation Panel [NHANES]    Frequency of Communication with Friends and Family: Once a week    Frequency of Social Gatherings with Friends and Family: Twice a week    Attends Religious Services: 1 to 4 times per year    Active Member of [employer] or Organizations: No    Attends Engineer, structural: Not on file    Marital Status: Married  Catering manager Violence: Not on file    Family History  Problem Relation Age of Onset   Hypertension Mother    Hypertension Father    Hypertension Sister    Hypertension Brother     No past surgical history on file.  ROS: Review of Systems Negative except as stated above  PHYSICAL EXAM: BP (!) 141/85 (BP Location: Left Arm, Patient Position: Sitting, Cuff Size: Large)   Pulse 60   Temp 98.2 F (36.8 C) (Oral)   Ht 5\' 4"  (1.626 m)   Wt 200 lb (90.7 kg)   SpO2 97%   BMI 34.33 kg/m   Physical Exam BP 160/90  General appearance - alert, well appearing, middle-aged African-American male and in no distress.  He is sitting in exam chair.  He has his cane with him. Mental status - normal mood, behavior, speech, dress, motor activity, and thought processes Neck - supple, no significant adenopathy Chest - clear to auscultation, no wheezes, rales or rhonchi, symmetric air entry Heart - normal rate, regular rhythm, normal S1, S2, no murmurs, rubs, clicks or gallops Extremities - peripheral pulses normal, no pedal edema, no clubbing or cyanosis    11/05/2022    4:45 PM 04/13/2022    3:59 PM 12/09/2021    4:46 PM  Depression screen PHQ 2/9  Decreased Interest 3 2 1   Down, Depressed, Hopeless 3 1 0  PHQ - 2 Score 6 3 1   Altered sleeping 3 1   Tired, decreased energy 3 1   Change in appetite 0 0   Feeling bad or failure about yourself  3 1    Trouble concentrating 0 1   Moving slowly or fidgety/restless 0 0   Suicidal thoughts 0 0   PHQ-9 Score 15 7   Difficult doing work/chores Very difficult         Latest Ref Rng & Units 09/18/2022    2:42 PM 06/03/2022    1:20 PM 04/13/2022    5:00 PM  CMP  Glucose 65 - 99 mg/dL 51  409  811   BUN 7 - 25 mg/dL 20  17  17    Creatinine 0.70 - 1.30 mg/dL 9.14  7.82  9.56   Sodium 135 - 146 mmol/L 141  143  144   Potassium 3.5 - 5.3 mmol/L 4.0  4.4  4.3   Chloride 98 - 110 mmol/L 106  106  106   CO2 20 - 32 mmol/L 25  22  23    Calcium 8.6 - 10.3 mg/dL 9.1  9.2  9.8    Lipid Panel     Component Value Date/Time   CHOL 101 05/25/2021 0436   TRIG 24 05/25/2021 0436   HDL 47 05/25/2021 0436   CHOLHDL 2.1 05/25/2021 0436   VLDL 5 05/25/2021 0436   LDLCALC 49 05/25/2021 0436    CBC    Component Value Date/Time   WBC 8.7 05/24/2021 1609   RBC 4.84 05/24/2021 1609   HGB 14.6 05/24/2021 1641   HCT 43.0 05/24/2021 1641   PLT 253 05/24/2021 1609   MCV 88.0 05/24/2021 1609   MCH 30.0 05/24/2021 1609   MCHC 34.0 05/24/2021 1609   RDW 13.2 05/24/2021 1609   LYMPHSABS 1.3 05/24/2021 1609   MONOABS 0.6 05/24/2021 1609   EOSABS 0.3 05/24/2021 1609   BASOSABS 0.1 05/24/2021 1609    ASSESSMENT AND PLAN: 1. Resistant hypertension Blood pressure not at goal today.  However last 2 blood pressure readings at home been good.  No changes made.  Continue clonidine 0.2 mg patch weekly,  Diovan 80 mg, amlodipine 10 mg daily, hydralazine 100 mg 3 times a day, Eplerenone 50 mg daily and metoprolol 75 mg twice a day.  - Basic Metabolic Panel  2. Hyperaldosteronism (HCC) On Eplerenone.  Seen by endocrinology Dr. Brooks Sailors.  Plan is to treat medically at this time and avoid invasive procedures.  3. History of hemorrhagic cerebrovascular accident (CVA) with residual deficit Continue aspirin and atorvastatin.  Good blood pressure control is important.  4. Seizure disorder (HCC) Stable with no  recurrent seizures.  Continue Keppra 500 mg twice a day.  5. Stage 3b chronic kidney disease (HCC) He will return to the lab tomorrow or sometime next week for BMP  6. Major depressive disorder, single episode, mild (HCC) Patient does not feel he needs to be on any medicines at this time.  No SI.  He feels he would benefit from some counseling.  Message sent to our LCSW Ms. Margo Aye.  7. Influenza vaccination declined   Patient was given the opportunity to ask questions.  Patient verbalized understanding of the plan and was able to repeat key elements of the plan.   This documentation was completed using Paediatric nurse.  Any transcriptional errors are unintentional.  No orders of the defined types were placed in this encounter.    Requested Prescriptions    No prescriptions requested or ordered in this encounter    No follow-ups on file.  Jonah Blue, MD, FACP

## 2022-11-11 ENCOUNTER — Telehealth: Payer: Self-pay | Admitting: Licensed Clinical Social Worker

## 2022-11-11 ENCOUNTER — Other Ambulatory Visit: Payer: Self-pay | Admitting: Internal Medicine

## 2022-11-11 DIAGNOSIS — I6930 Unspecified sequelae of cerebral infarction: Secondary | ICD-10-CM

## 2022-11-11 NOTE — Telephone Encounter (Signed)
LCSWA called patient today to introduce herself and to assess patients' mental health needs. Patient did not answer the phone. LCSWA was able to leave a brief message with the patient asking them to return the call. Patient was referred by PCP for depression and seeking therapy.

## 2022-11-12 ENCOUNTER — Other Ambulatory Visit: Payer: Self-pay

## 2022-11-12 MED ORDER — TRAZODONE HCL 150 MG PO TABS
75.0000 mg | ORAL_TABLET | Freq: Every day | ORAL | 1 refills | Status: DC
Start: 2022-11-12 — End: 2023-02-17
  Filled 2022-11-12 – 2022-12-10 (×2): qty 15, 30d supply, fill #0
  Filled 2022-12-31 – 2023-01-18 (×2): qty 15, 30d supply, fill #1

## 2022-11-12 NOTE — Telephone Encounter (Signed)
Requested Prescriptions  Pending Prescriptions Disp Refills   traZODone (DESYREL) 150 MG tablet 15 tablet 1    Sig: Take 0.5 tablets (75 mg total) by mouth at bedtime.     Psychiatry: Antidepressants - Serotonin Modulator Passed - 11/11/2022 10:39 PM      Passed - Completed PHQ-2 or PHQ-9 in the last 360 days      Passed - Valid encounter within last 6 months    Recent Outpatient Visits           1 week ago Resistant hypertension   [facility] Marcine Matar, MD   7 months ago Essential hypertension   [facility] Marcine Matar, MD   11 months ago Essential hypertension   [facility] Marcine Matar, MD   1 year ago Essential hypertension   [facility] Marcine Matar, MD   1 year ago Essential hypertension   [facility] Marcine Matar, MD       Future Appointments             In 2 months Marcine Matar, MD [facility]

## 2022-11-15 NOTE — Telephone Encounter (Signed)
Pt was called by LCSW.

## 2022-11-18 ENCOUNTER — Other Ambulatory Visit: Payer: Self-pay

## 2022-11-19 ENCOUNTER — Other Ambulatory Visit: Payer: Self-pay

## 2022-11-24 ENCOUNTER — Other Ambulatory Visit: Payer: Self-pay

## 2022-11-27 ENCOUNTER — Ambulatory Visit: Payer: Self-pay | Attending: Nurse Practitioner

## 2022-11-28 LAB — BASIC METABOLIC PANEL
BUN/Creatinine Ratio: 10 (ref 9–20)
BUN: 21 mg/dL (ref 6–24)
CO2: 22 mmol/L (ref 20–29)
Calcium: 9.5 mg/dL (ref 8.7–10.2)
Chloride: 106 mmol/L (ref 96–106)
Creatinine, Ser: 2.16 mg/dL — ABNORMAL HIGH (ref 0.76–1.27)
Glucose: 55 mg/dL — ABNORMAL LOW (ref 70–99)
Potassium: 4.5 mmol/L (ref 3.5–5.2)
Sodium: 144 mmol/L (ref 134–144)
eGFR: 35 mL/min/{1.73_m2} — ABNORMAL LOW (ref >59)

## 2022-12-05 ENCOUNTER — Encounter: Payer: Self-pay | Admitting: Internal Medicine

## 2022-12-10 ENCOUNTER — Other Ambulatory Visit: Payer: Self-pay

## 2022-12-14 ENCOUNTER — Other Ambulatory Visit: Payer: Self-pay

## 2022-12-18 ENCOUNTER — Encounter: Payer: Self-pay | Admitting: Internal Medicine

## 2022-12-18 ENCOUNTER — Other Ambulatory Visit: Payer: Self-pay

## 2022-12-18 ENCOUNTER — Ambulatory Visit (INDEPENDENT_AMBULATORY_CARE_PROVIDER_SITE_OTHER): Payer: Self-pay | Admitting: Internal Medicine

## 2022-12-18 VITALS — BP 160/102 | HR 74 | Ht 64.0 in | Wt 203.4 lb

## 2022-12-18 DIAGNOSIS — I1A0 Resistant hypertension: Secondary | ICD-10-CM

## 2022-12-18 DIAGNOSIS — E269 Hyperaldosteronism, unspecified: Secondary | ICD-10-CM | POA: Insufficient documentation

## 2022-12-18 MED ORDER — EPLERENONE 50 MG PO TABS
50.0000 mg | ORAL_TABLET | Freq: Two times a day (BID) | ORAL | 3 refills | Status: DC
Start: 2022-12-18 — End: 2023-12-29
  Filled 2022-12-18 – 2022-12-31 (×2): qty 180, 90d supply, fill #0
  Filled 2023-01-01: qty 60, 30d supply, fill #0
  Filled 2023-01-25 – 2023-01-26 (×2): qty 60, 30d supply, fill #1
  Filled 2023-02-28 – 2023-03-03 (×2): qty 60, 30d supply, fill #2
  Filled 2023-04-20: qty 60, 30d supply, fill #3
  Filled 2023-05-18: qty 60, 30d supply, fill #4
  Filled 2023-06-17 – 2023-06-18 (×2): qty 60, 30d supply, fill #5
  Filled 2023-07-20 – 2023-07-21 (×2): qty 60, 30d supply, fill #6
  Filled 2023-09-05: qty 60, 30d supply, fill #7
  Filled 2023-10-13: qty 60, 30d supply, fill #8
  Filled 2023-11-25: qty 60, 30d supply, fill #9

## 2022-12-18 NOTE — Patient Instructions (Signed)
Increase Epleronone 50 mg, 1 tablet twice daily  Continue Clonidine 0.2 patch weekly Continue Diovan 80 mg daily Continue hydralazine 100 mg, 1 tablet 3 times a day Continue metoprolol 50 mg, 1.5 tablets twice daily Continue amlodipine 10 mg daily

## 2022-12-18 NOTE — Progress Notes (Signed)
Name: Miguel Mccullough  MRN/ DOB: [id number], 1965-06-24    Age/ Sex: 57 y.o., male    PCP: Marcine Matar, MD   Reason for Endocrinology Evaluation: Hyperaldosteronism     Date of Initial Endocrinology Evaluation: 09/18/2022    HPI: Miguel Mccullough is a 57 y.o. male with a past medical history of HTN, CKD, CVA. The patient presented for initial endocrinology clinic visit on 09/18/2022 for consultative assistance with his hyperaldosteronism.   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  24-hour urinary cortisol was normal at 30.7 mcg 09/2022  SUBJECTIVE:    Today (12/18/22):  Miguel Mccullough is here for follow-up on hypertension.   BP at home 146/93 mmhg   Denies headaches or vision changes  Denies chest pain  Stable  occasional left LE swelling     Diovan 80 mg daily Hydralazine 100 mg 3 times daily Eplerenone 50 mg daily Amlodipine 10 mg daily Metoprolol 50 mg, 1.5 tabs  BID Clonidine 0.2 mg patch every Friday      HISTORY:  Past Medical History:  Past Medical History:  Diagnosis Date   COVID-19 2021   continues to have increase in suptum production.   Hypertension    TIA (transient ischemic attack)    Past Surgical History: No past surgical history on file.  Social History:  reports that he has never smoked. He has never used smokeless tobacco. He reports that he does not currently use alcohol. He reports that he does not use drugs. Family History: family history includes  Hypertension in his brother, father, mother, and sister.   HOME MEDICATIONS: Allergies as of 12/18/2022       Reactions   Spironolactone         Medication List        Accurate as of December 18, 2022 10:08 AM. If you have any questions, ask your nurse or doctor.          amantadine 100 MG capsule Commonly known as: SYMMETREL Take 1 capsule (100 mg total) by mouth daily.   amLODipine 10 MG tablet Commonly known as: NORVASC Take 1 tablet (10 mg total) by mouth at bedtime.   aspirin EC 81 MG tablet Take 81 mg by mouth daily. Swallow whole.   atorvastatin 40 MG tablet Commonly known as: LIPITOR Take 1 tablet (40 mg total) by mouth at bedtime.   b complex vitamins capsule Take 1 capsule by mouth daily.   baclofen 10 MG tablet Commonly known as: LIORESAL Take 1 tablet (10 mg total) by mouth 3 (three) times daily.   citalopram 20 MG tablet Commonly known as: CELEXA Take 1 tablet (20 mg total) by mouth daily.   cloNIDine 0.2 mg/24hr patch Commonly known as: CATAPRES - Dosed in mg/24 hr Place 1 patch (0.2 mg total) onto the skin once a week.   eplerenone 50 MG tablet Commonly known as: INSPRA Take 1 tablet (50 mg total) by mouth daily.   hydrALAZINE 100 MG  tablet Commonly known as: APRESOLINE Take 1 tablet (100 mg total) by mouth every 8 (eight) hours.   levETIRAcetam 500 MG tablet Commonly known as: KEPPRA Take 1 tablet (500 mg total) by mouth 2 (two) times daily.   magnesium oxide 400 MG tablet Commonly known as: MAG-OX Take 1 tablet (400 mg total) by mouth daily.   metoprolol tartrate 50 MG tablet Commonly known as: LOPRESSOR Take 1 & 1/2 tablets (75 mg total) by mouth 2 (two) times daily.   pantoprazole 40 MG tablet Commonly known as: PROTONIX Take 1 tablet (40 mg total) by mouth at bedtime.   Stimulant Laxative 8.6-50 MG tablet Generic drug: senna-docusate Take 2 tablets by mouth at bedtime as needed for mild constipation. What changed:  when  to take this additional instructions   tiZANidine 4 MG tablet Commonly known as: Zanaflex Take 1 tablet (4 mg total) by mouth every 8 (eight) hours as needed for muscle spasms (spasticity/tightness).   traZODone 150 MG tablet Commonly known as: DESYREL Take 0.5 tablets (75 mg total) by mouth at bedtime.   valsartan 80 MG tablet Commonly known as: Diovan Take 1 tablet (80 mg total) by mouth daily.          REVIEW OF SYSTEMS: A comprehensive ROS was conducted with the patient and is negative except as per HPI    OBJECTIVE:  VS: BP (!) 160/100   Pulse 74   Ht 5\' 4"  (1.626 m)   Wt 203 lb 6.4 oz (92.3 kg)   SpO2 98%   BMI 34.91 kg/m    Wt Readings from Last 3 Encounters:  12/18/22 203 lb 6.4 oz (92.3 kg)  11/05/22 200 lb (90.7 kg)  09/18/22 200 lb 3.2 oz (90.8 kg)     EXAM: General: Pt appears well and is in NAD  Lungs: Clear with good BS bilat   Heart: Auscultation: RRR.  Extremities:  BL LE: 1+ pretibial edema on the left   Mental Status: Judgment, insight: Intact Orientation: Oriented to time, place, and person Mood and affect: No depression, anxiety, or agitation     DATA REVIEWED:   Latest Reference Range & Units 11/27/22 13:45  Sodium 134 - 144 mmol/L 144  Potassium 3.5 - 5.2 mmol/L 4.5  Chloride 96 - 106 mmol/L 106  CO2 20 - 29 mmol/L 22  Glucose 70 - 99 mg/dL 55 (L)  BUN 6 - 24 mg/dL 21  Creatinine 8.11 - 9.14 mg/dL 7.82 (H)  Calcium 8.7 - 10.2 mg/dL 9.5  BUN/Creatinine Ratio 9 - 20  10  eGFR >59 mL/min/1.73 35 (L)   Old records , labs and images have been reviewed.    ASSESSMENT/PLAN/RECOMMENDATIONS:   Resistant hypertension:  -Patient with abnormal Aldo/PRA ratio, we discussed that the patient had abnormal screening test, we discussed that typically confirmation is the neck step with either salt or saline loading, this is to be performed after being off eplerenone for approximately 6 weeks.  Given that the patient had a hemorrhagic stroke  last year and the risk of elevated BP during this process, we opted to treat the patient medically at this time and avoid invasive procedures. -24-hour urinary cortisol normal -Will recheck metanephrines in 4 weeks -I have recommended increasing eplerenone as below, caution against dehydration, encouraged hydration  Medications :  Continue clonidine 0.2 mg patch weekly Increase eplerenone 50 mg BID  Continue Diovan 80 mg daily Continue hydralazine 100 mg 3 times daily Continue metoprolol 50 mg, 1.5 tablets twice daily Continue  amlodipine 10 mg daily     2. Low GFR:  -His GFR has trended down -I have encouraged hydration, I will increase his eplerenone which could reduce his GFR further -We will recheck in 4 weeks   Follow-up in 4 months    Signed electronically by: Lyndle Herrlich, MD  [facility] Endocrinology  [facility] [address] [place] [place], [place] [address] Phone: [phone] FAX: [phone]   CC: Marcine Matar, MD [address] [place] [address] [place] [address] Phone: [phone] Fax: [phone]   Return to Endocrinology clinic as below: Future Appointments  Date Time Provider Department Center  02/04/2023  3:10 PM Marcine Matar, MD [facility] None

## 2022-12-30 ENCOUNTER — Other Ambulatory Visit: Payer: Self-pay

## 2022-12-31 ENCOUNTER — Other Ambulatory Visit: Payer: Self-pay | Admitting: Internal Medicine

## 2022-12-31 DIAGNOSIS — I6930 Unspecified sequelae of cerebral infarction: Secondary | ICD-10-CM

## 2022-12-31 DIAGNOSIS — F320 Major depressive disorder, single episode, mild: Secondary | ICD-10-CM

## 2023-01-01 ENCOUNTER — Other Ambulatory Visit: Payer: Self-pay

## 2023-01-01 MED ORDER — ATORVASTATIN CALCIUM 40 MG PO TABS
40.0000 mg | ORAL_TABLET | Freq: Every day | ORAL | 1 refills | Status: DC
  Filled 2023-01-01 – 2023-01-18 (×2): qty 30, 30d supply, fill #0
  Filled 2023-02-17 – 2023-02-18 (×2): qty 30, 30d supply, fill #1

## 2023-01-01 MED ORDER — CITALOPRAM HYDROBROMIDE 20 MG PO TABS
20.0000 mg | ORAL_TABLET | Freq: Every day | ORAL | 1 refills | Status: DC
  Filled 2023-01-01 – 2023-01-18 (×2): qty 30, 30d supply, fill #0
  Filled 2023-02-17 – 2023-02-18 (×2): qty 30, 30d supply, fill #1

## 2023-01-18 ENCOUNTER — Other Ambulatory Visit: Payer: Self-pay

## 2023-01-18 ENCOUNTER — Other Ambulatory Visit: Payer: Self-pay | Admitting: Physical Medicine and Rehabilitation

## 2023-01-18 DIAGNOSIS — E269 Hyperaldosteronism, unspecified: Secondary | ICD-10-CM

## 2023-01-18 DIAGNOSIS — I1A0 Resistant hypertension: Secondary | ICD-10-CM

## 2023-01-18 LAB — BASIC METABOLIC PANEL
BUN/Creatinine Ratio: 13 (calc) (ref 6–22)
BUN: 26 mg/dL — ABNORMAL HIGH (ref 7–25)
CO2: 25 mmol/L (ref 20–32)
Calcium: 9.5 mg/dL (ref 8.6–10.3)
Chloride: 107 mmol/L (ref 98–110)
Creat: 2.03 mg/dL — ABNORMAL HIGH (ref 0.70–1.30)
Glucose, Bld: 80 mg/dL (ref 65–99)
Potassium: 4.2 mmol/L (ref 3.5–5.3)
Sodium: 142 mmol/L (ref 135–146)

## 2023-01-18 MED ORDER — TIZANIDINE HCL 4 MG PO TABS
4.0000 mg | ORAL_TABLET | Freq: Three times a day (TID) | ORAL | 5 refills | Status: DC | PRN
  Filled 2023-01-18 – 2023-01-26 (×2): qty 90, 30d supply, fill #0
  Filled 2023-03-17 – 2023-03-18 (×3): qty 90, 30d supply, fill #1
  Filled 2023-05-11 – 2023-05-12 (×2): qty 90, 30d supply, fill #2
  Filled 2023-06-08 – 2023-06-09 (×2): qty 90, 30d supply, fill #3
  Filled 2023-07-21: qty 90, 30d supply, fill #4
  Filled 2023-07-29 – 2023-08-16 (×3): qty 90, 30d supply, fill #5

## 2023-01-22 ENCOUNTER — Other Ambulatory Visit: Payer: Self-pay

## 2023-01-25 ENCOUNTER — Other Ambulatory Visit: Payer: Self-pay

## 2023-01-26 ENCOUNTER — Other Ambulatory Visit: Payer: Self-pay

## 2023-01-31 ENCOUNTER — Other Ambulatory Visit: Payer: Self-pay | Admitting: Internal Medicine

## 2023-02-01 ENCOUNTER — Other Ambulatory Visit: Payer: Self-pay

## 2023-02-01 MED ORDER — MAGNESIUM OXIDE 400 MG PO TABS
400.0000 mg | ORAL_TABLET | Freq: Every day | ORAL | 0 refills | Status: DC
  Filled 2023-02-01 – 2023-02-17 (×2): qty 30, 30d supply, fill #0

## 2023-02-04 ENCOUNTER — Ambulatory Visit: Payer: Self-pay | Admitting: Internal Medicine

## 2023-02-17 ENCOUNTER — Other Ambulatory Visit: Payer: Self-pay | Admitting: Internal Medicine

## 2023-02-17 DIAGNOSIS — I6930 Unspecified sequelae of cerebral infarction: Secondary | ICD-10-CM

## 2023-02-17 MED ORDER — BACLOFEN 10 MG PO TABS
10.0000 mg | ORAL_TABLET | Freq: Three times a day (TID) | ORAL | 0 refills | Status: DC
  Filled 2023-02-17: qty 90, 30d supply, fill #0

## 2023-02-17 MED ORDER — TRAZODONE HCL 150 MG PO TABS
75.0000 mg | ORAL_TABLET | Freq: Every day | ORAL | 0 refills | Status: DC
  Filled 2023-02-17: qty 15, 30d supply, fill #0

## 2023-02-18 ENCOUNTER — Other Ambulatory Visit: Payer: Self-pay

## 2023-02-22 IMAGING — CT CT ANGIO HEAD-NECK (W OR W/O PERF)
2 of 7 series · 8 of 33 positions shown · non-contrast
Comparison: Head CT earlier same day. CT studies [REDACTED] and
[REDACTED] of this year.

CLINICAL DATA: Follow-up stroke. Previous right thalamic
hemorrhage.

EXAM:
CT ANGIOGRAPHY HEAD AND NECK
TECHNIQUE: Multidetector CT imaging of the head and neck was performed using
the standard protocol during bolus administration of intravenous
contrast. Multiplanar CT image reconstructions and MIPs were
obtained to evaluate the vascular anatomy. Carotid stenosis
measurements (when applicable) are obtained utilizing NASCET
criteria, using the distal internal carotid diameter as the
denominator.

[Series 5: cta neck · axial · 0.49mm/px · z∈[-253,-139]mm · 2 of 172 slices shown]
[im 58/172  soft-tissue]
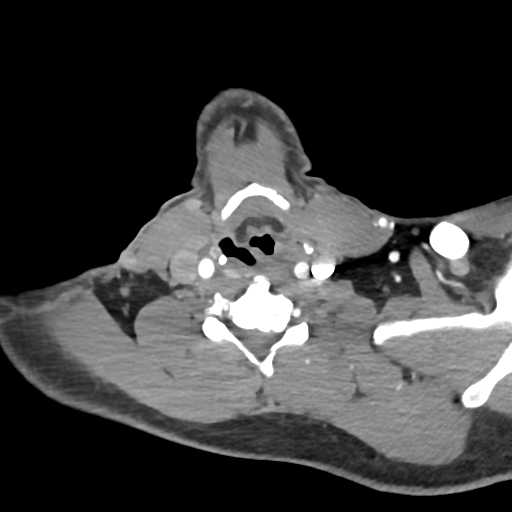
[im 115/172  bone]
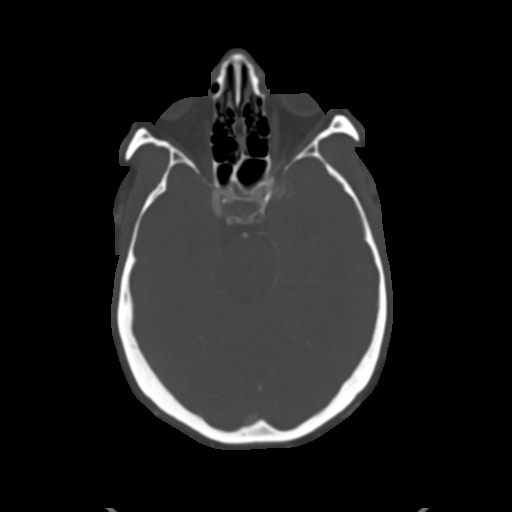

[Series 7: cta neck axial · axial · 0.42mm/px · z∈[-328,-89]mm · 6 of 337 slices shown]
[im 49/337  soft-tissue]
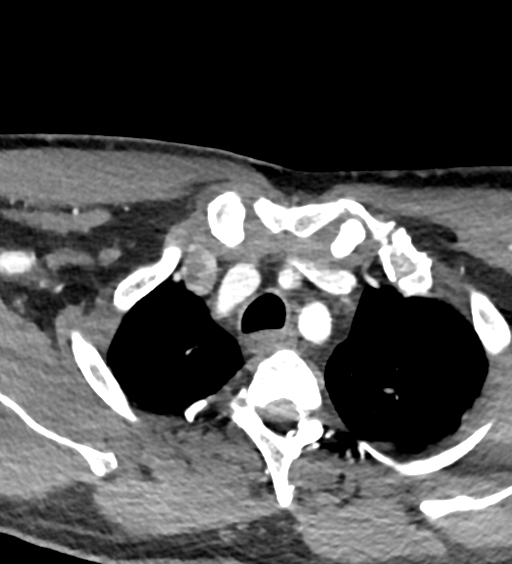
[im 97/337  soft-tissue]
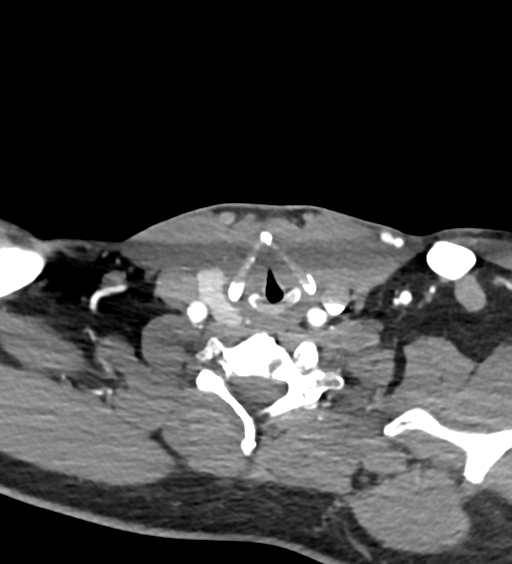
[im 145/337  soft-tissue]
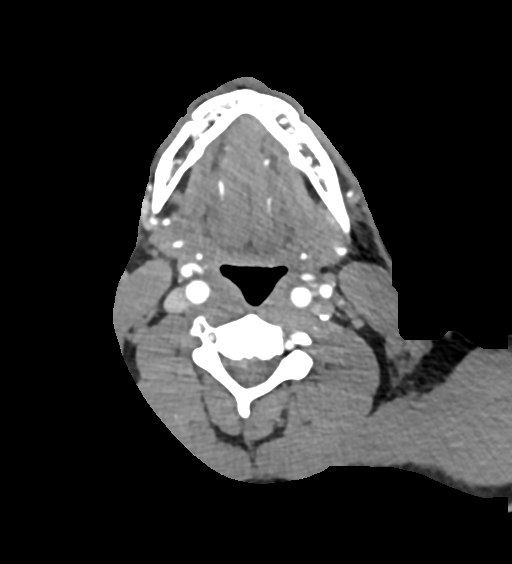
[im 193/337  soft-tissue]
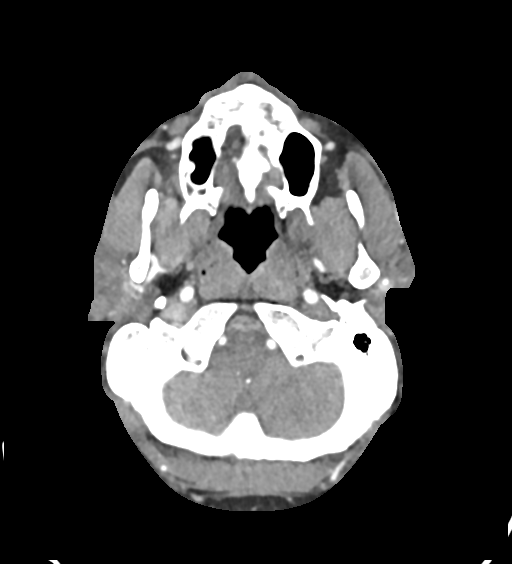
[im 241/337  soft-tissue]
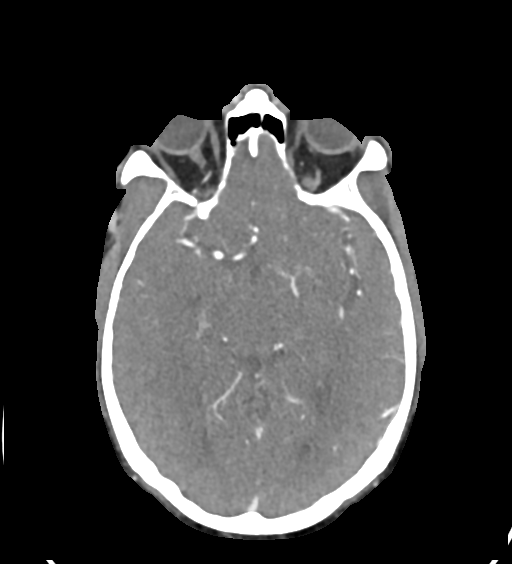
[im 289/337  soft-tissue]
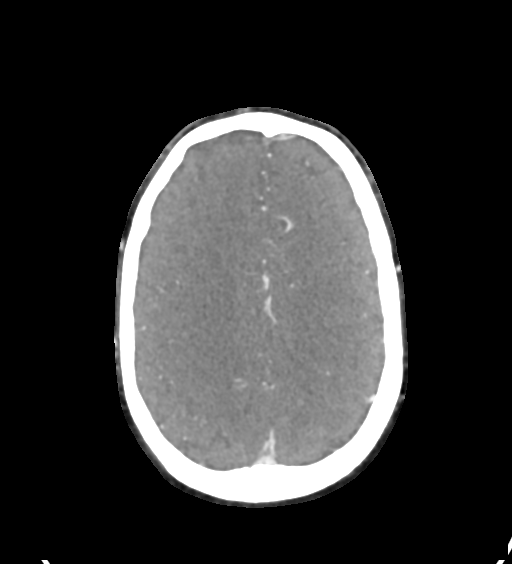

[8 of 33 positions shown; findings below may reference images not displayed]

RADIATION DOSE REDUCTION: This exam was performed according to the
departmental dose-optimization program which includes automated
exposure control, adjustment of the mA and/or kV according to
patient size and/or use of iterative reconstruction technique.

CONTRAST:  75mL OMNIPAQUE IOHEXOL 350 MG/ML SOLN
FINDINGS: CTA NECK FINDINGS

Aortic arch: Normal.  Branching pattern is normal.

Right carotid system: Common carotid artery widely patent to the
bifurcation. Carotid bifurcation is normal without soft or calcified
plaque. Cervical ICA is normal.

Left carotid system: Left carotid system similarly normal.

Vertebral arteries: Both vertebral artery origins are widely patent.
Both vertebral arteries are normal through the cervical region to
the foramen magnum.

Skeleton: Ordinary cervical spondylosis. Scoliotic curvature.

Other neck: No mass or lymphadenopathy. Previous thyroidectomy on
the left.

Upper chest: Normal

Review of the MIP images confirms the above findings

CTA HEAD FINDINGS

Anterior circulation: Both internal carotid arteries are widely
patent through the skull base and siphon regions. The anterior and
middle cerebral vessels are patent. No large vessel occlusion. Mild
irregularity of the M1 segments without flow limiting stenosis. Mild
distal vessel atherosclerotic irregularity.

Posterior circulation: Both vertebral arteries are widely patent to
the basilar. No basilar stenosis. Posterior circulation branch
vessels are patent. Mild atherosclerotic irregularity of the PCA
branches.

Venous sinuses: Patent and normal.

Anatomic variants: None significant.

Review of the MIP images confirms the above findings
IMPRESSION: No acute vascular finding.

No large vessel occlusion or flow limiting proximal stenosis.

Atherosclerotic irregularity of the M1 segments and the distal
branch vessels of both the anterior and posterior circulations.

## 2023-02-22 IMAGING — MR MR HEAD W/O CM
6 of 10 series · 29 of 48 positions shown · non-contrast
Comparison: 05/24/2021 head CT

CLINICAL DATA: Transient ischemic attack

EXAM:
MRI HEAD WITHOUT CONTRAST
TECHNIQUE: Multiplanar, multiecho pulse sequences of the brain and surrounding
structures were obtained without intravenous contrast.

[Series 2: DWI · axial · 3.0mm · 0.94mm/px · z∈[-100,+49]mm · 9 of 104 slices shown (1 of 2)]
[im 1/104]
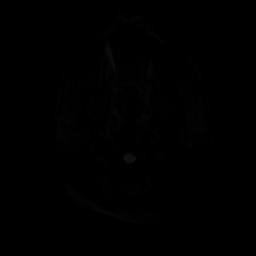
[im 13/104]
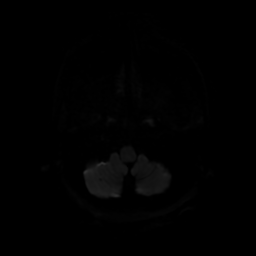
[im 26/104]
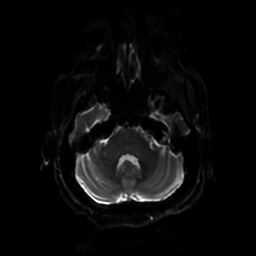
[im 39/104]
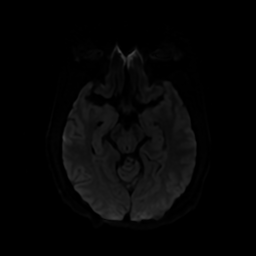
[im 52/104]
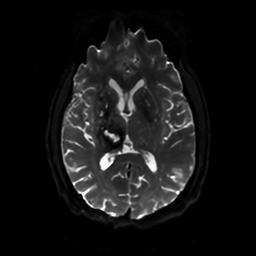
[im 65/104]
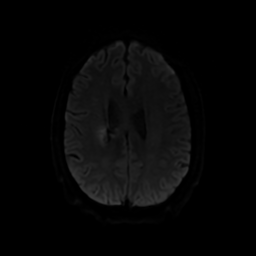
[im 78/104]
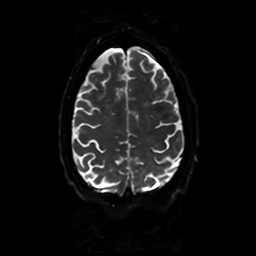
[im 91/104]
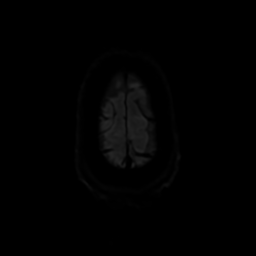
[im 104/104]
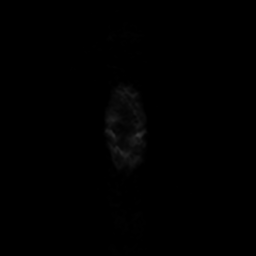

[Series 3: DWI · coronal · 4.0mm · 0.94mm/px · 7 of 74 slices shown (2 of 2)]
[im 1/74]
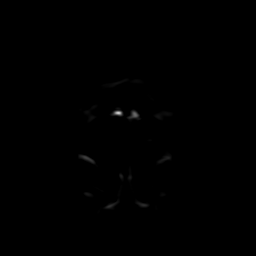
[im 13/74]
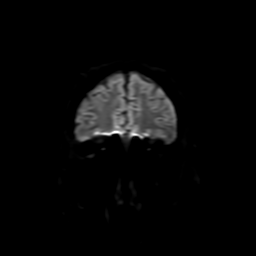
[im 25/74]
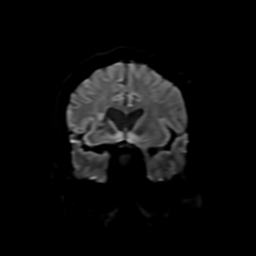
[im 37/74]
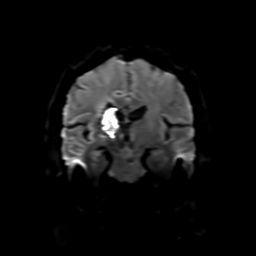
[im 49/74]
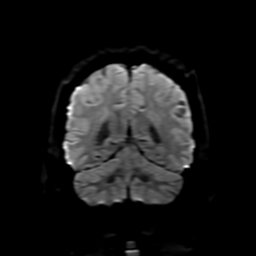
[im 61/74]
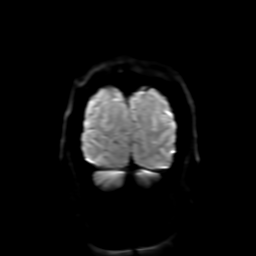
[im 74/74]
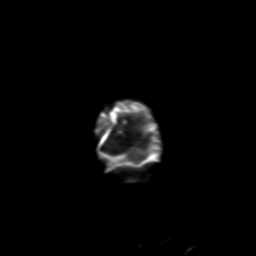

[Series 4: FLAIR · sagittal · 5.0mm · 0.23mm/px · 2 of 23 slices shown (1 of 2)]
[im 1/23]
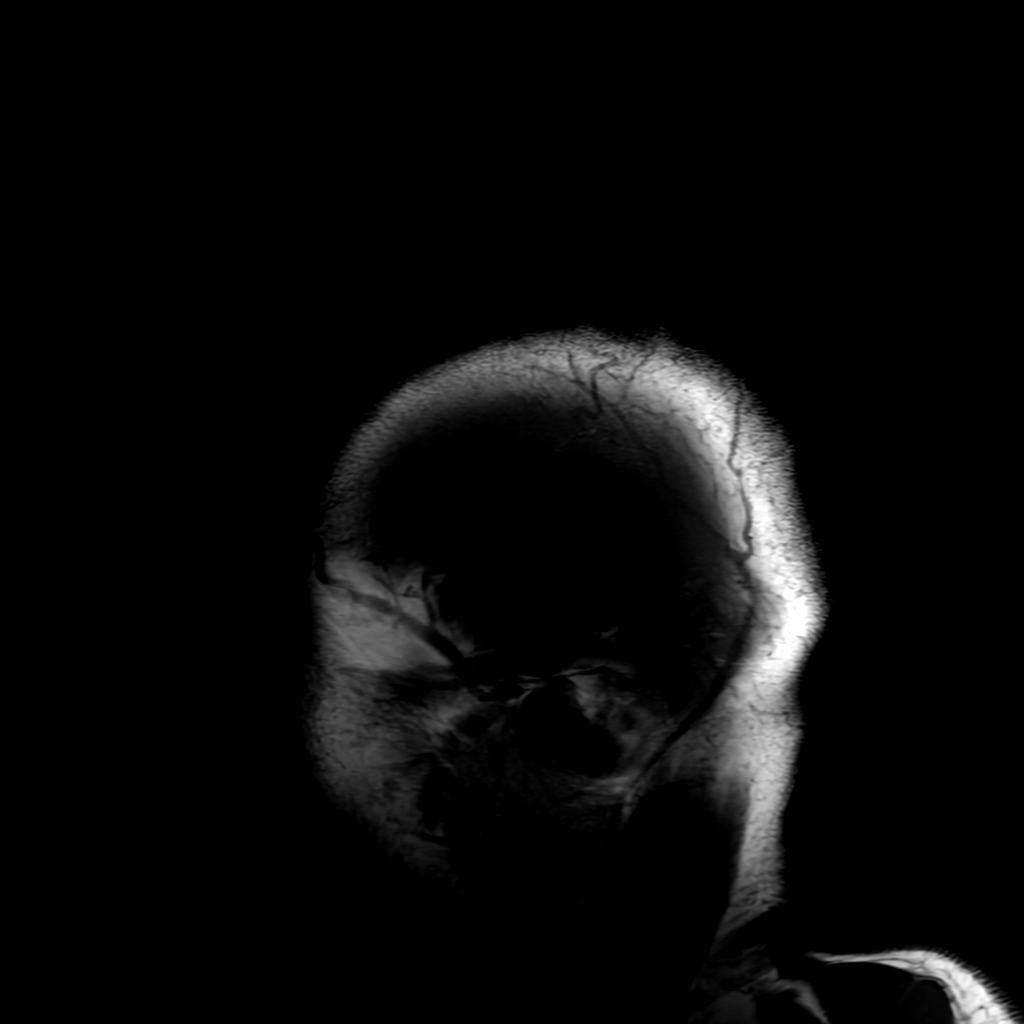
[im 23/23]
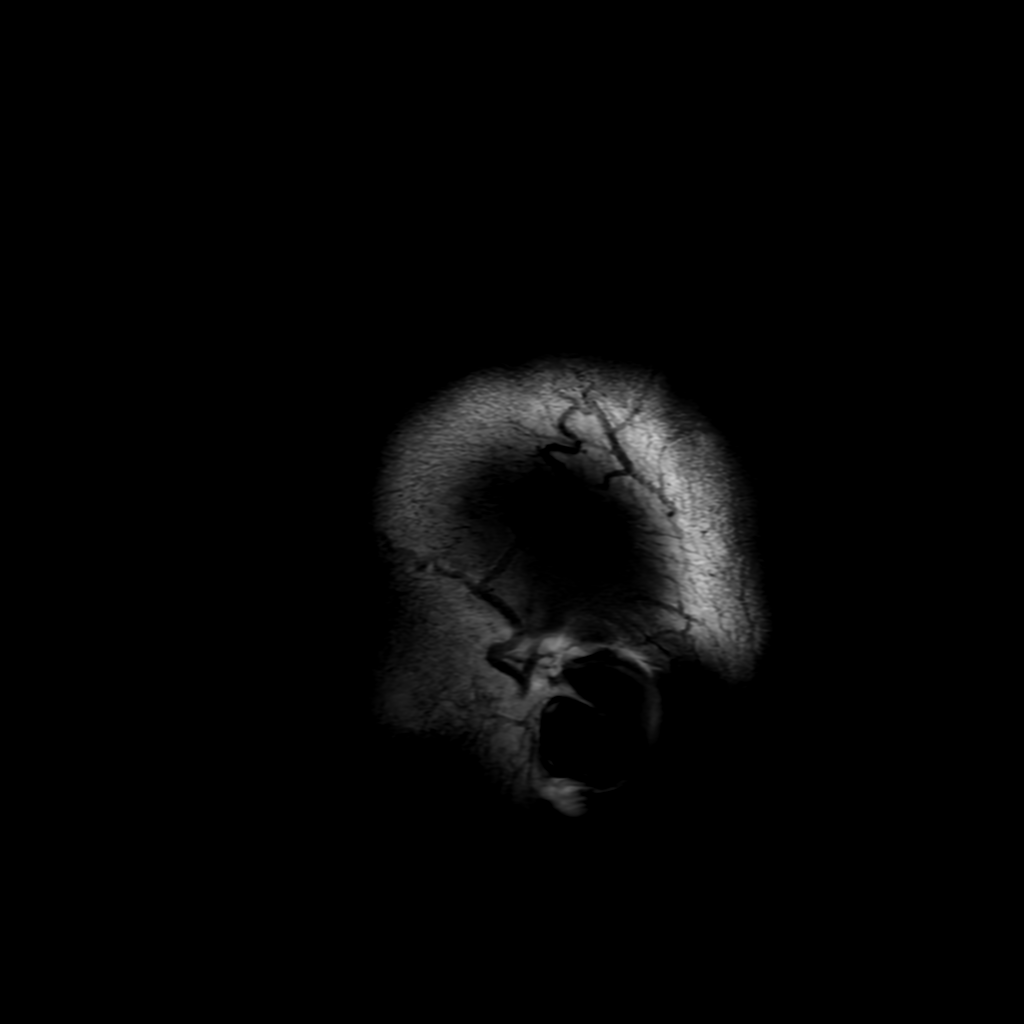

[Series 6: FLAIR · axial · 4.0mm · 0.45mm/px · z∈[-98,+48]mm · 3 of 35 slices shown (2 of 2)]
[im 1/35]
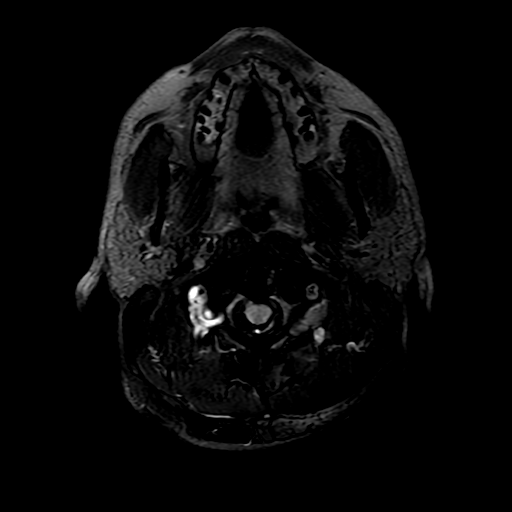
[im 18/35]
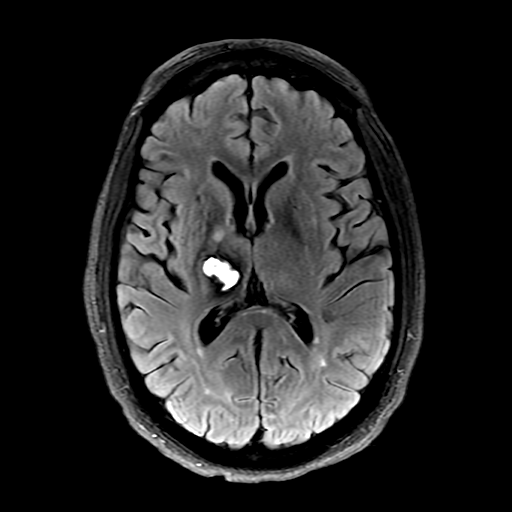
[im 35/35]
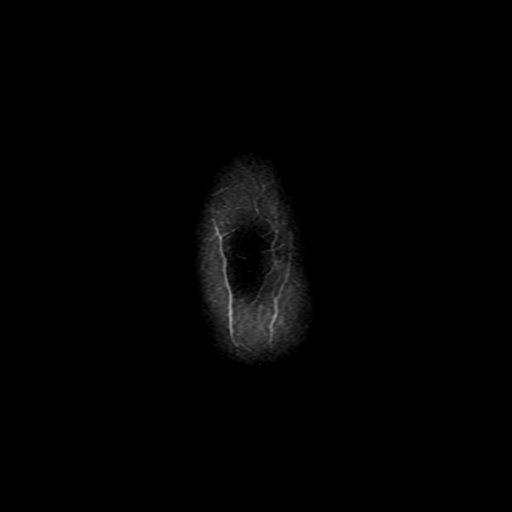

[Series 250: ADC · axial · 3.0mm · 0.94mm/px · z∈[-100,+49]mm · 5 of 52 slices shown (1 of 2)]
[im 1/52]
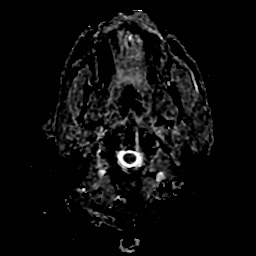
[im 13/52]
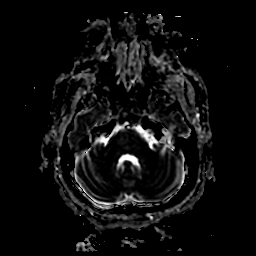
[im 26/52]
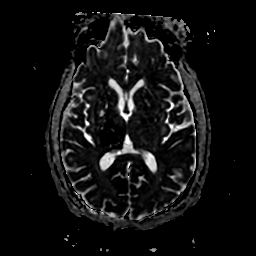
[im 39/52]
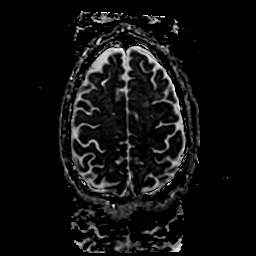
[im 52/52]
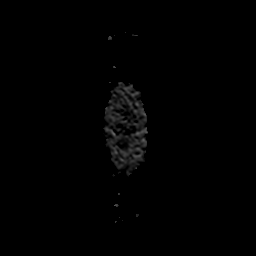

[Series 350: ADC · coronal · 4.0mm · 0.94mm/px · 3 of 38 slices shown (2 of 2)]
[im 1/38]
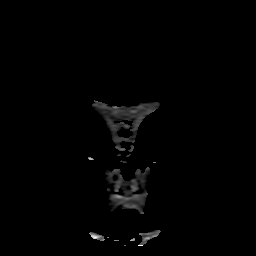
[im 19/38]
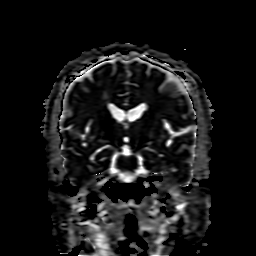
[im 38/38]
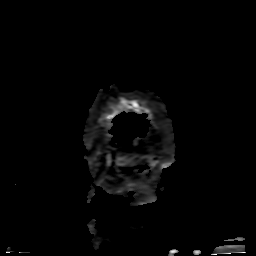

[29 of 48 positions shown; findings below may reference images not displayed]

FINDINGS: Brain: There is intraparenchymal hemorrhage in the right thalamus
with hyperintense T1 and T2-weighted signal, with decreased size of
the hematoma compared to 04/14/2021. There is minimal associated
edema. There are numerous chronic microhemorrhages in the cerebrum,
brainstem and cerebellum. There is multifocal hyperintense
T2-weighted signal within the white matter. Generalized volume loss
without a clear lobar predilection. Old right basal ganglia small
vessel infarct. The midline structures are normal. No acute infarct.

Vascular: Major flow voids are preserved.

Skull and upper cervical spine: Normal calvarium and skull base.
Visualized upper cervical spine and soft tissues are normal.

Sinuses/Orbits:No paranasal sinus fluid levels or advanced mucosal
thickening. No mastoid or middle ear effusion. Normal orbits.
IMPRESSION: 1. Decreased size of right thalamic intraparenchymal hemorrhage with
minimal residual edema.
2. No acute ischemia.
3. Numerous chronic microhemorrhages in the cerebrum, brainstem and
cerebellum, consistent with chronic hypertensive angiopathy.
4. Chronic ischemic microangiopathy and generalized volume loss.

## 2023-02-28 ENCOUNTER — Other Ambulatory Visit: Payer: Self-pay | Admitting: Internal Medicine

## 2023-02-28 MED ORDER — MAGNESIUM OXIDE 400 MG PO TABS
400.0000 mg | ORAL_TABLET | Freq: Every day | ORAL | 0 refills | Status: DC
  Filled 2023-02-28 – 2023-03-17 (×2): qty 30, 30d supply, fill #0

## 2023-03-01 ENCOUNTER — Other Ambulatory Visit: Payer: Self-pay

## 2023-03-01 ENCOUNTER — Other Ambulatory Visit (HOSPITAL_COMMUNITY): Payer: Self-pay

## 2023-03-03 ENCOUNTER — Other Ambulatory Visit: Payer: Self-pay

## 2023-03-03 ENCOUNTER — Other Ambulatory Visit (HOSPITAL_COMMUNITY): Payer: Self-pay

## 2023-03-04 ENCOUNTER — Other Ambulatory Visit: Payer: Self-pay

## 2023-03-08 ENCOUNTER — Ambulatory Visit: Payer: Self-pay | Attending: Internal Medicine | Admitting: Internal Medicine

## 2023-03-08 VITALS — BP 115/72 | Temp 97.8°F | Ht 64.0 in | Wt 195.0 lb

## 2023-03-08 DIAGNOSIS — Z1211 Encounter for screening for malignant neoplasm of colon: Secondary | ICD-10-CM

## 2023-03-08 DIAGNOSIS — N1832 Chronic kidney disease, stage 3b: Secondary | ICD-10-CM

## 2023-03-08 DIAGNOSIS — G4733 Obstructive sleep apnea (adult) (pediatric): Secondary | ICD-10-CM

## 2023-03-08 DIAGNOSIS — E269 Hyperaldosteronism, unspecified: Secondary | ICD-10-CM

## 2023-03-08 DIAGNOSIS — G40909 Epilepsy, unspecified, not intractable, without status epilepticus: Secondary | ICD-10-CM

## 2023-03-08 DIAGNOSIS — E66811 Obesity, class 1: Secondary | ICD-10-CM

## 2023-03-08 DIAGNOSIS — R5383 Other fatigue: Secondary | ICD-10-CM

## 2023-03-08 DIAGNOSIS — I1A0 Resistant hypertension: Secondary | ICD-10-CM

## 2023-03-08 DIAGNOSIS — R748 Abnormal levels of other serum enzymes: Secondary | ICD-10-CM

## 2023-03-08 NOTE — Patient Instructions (Signed)
Please apply for the orange card/cone discount card.  Once approved we can refer you for an updated sleep study.  Please make sure that you are drinking at least 4 to 8 glasses of water daily.

## 2023-03-08 NOTE — Progress Notes (Signed)
Patient ID: Miguel Mccullough, male    DOB: 08-04-1965  MRN: [id number]  CC: Hypertension (HTN f/u. /No questions / concerns/No to all vax. Yes to colonoscopy referral)   Subjective: Miguel Mccullough is a 58 y.o. male who presents for chronic ds management. 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 Marjory Lies 06/18/2021.  Started on Keppra).    HTN/hyperaldosterone:   Saw Dr. Brooks Sailors in November.  She has been trying to wean him off the clonidine patch.  Eplerenone was increased to 50 mg twice a day.    -Otherwise, he is still on  amlodipine 10 mg daily, hydralazine 100 mg 3 times a day, Diovan 80 mg daily, weekly Clonidine patch and metoprolol 75 mg twice a day.  -checks BP 1-2x/wk. Gives range 115-125/70-80 -no CP/SOB. Some swelling in LT leg, not new.  No dizziness. Feels tired/exhausted after breakfast and during the day  Gives hx of OSA dx around 2008.  On CPAP since then.  Uses it consistently.   History of seizure/CVA: Still ambulating with a cane; no recent falls.  No recent falls.  He continues Keppra 500 mg BID.  Compliant with atorvastatin 40 mg daily.  CKD 3: Recent creatinine was 2.03 with BUN slightly elevated at 26.  I do not see a GFR with the most recent BMP.  Last GFRs in 35-47.  Has increased water intake.   Obesity:  down 5 lbs since last visit 4 mths ago. Eating habits good. Drinks herbal tea, not eating as much.  Eating fruits/veggies daily.   HM:  due for colon CA screen. 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 30 capsule 5   amLODipine (NORVASC) 10 MG tablet Take 1 tablet (10 mg total) by mouth at bedtime. 90 tablet 3   aspirin EC 81 MG tablet Take 81 mg by mouth daily. Swallow whole.     atorvastatin (LIPITOR) 40 MG tablet Take 1 tablet (40 mg total) by mouth at bedtime. 30 tablet 1   b complex vitamins capsule Take 1 capsule by mouth daily.     baclofen (LIORESAL) 10 MG tablet Take 1 tablet (10 mg total) by mouth 3 (three) times daily. 90 tablet 0   citalopram (CELEXA) 20 MG tablet Take 1 tablet (20 mg total) by mouth daily. 30 tablet 1   cloNIDine (CATAPRES - DOSED IN MG/24 HR) 0.2 mg/24hr patch Place 1 patch (0.2 mg total) onto the skin once a week. 4 patch 12   eplerenone (INSPRA) 50 MG tablet Take 1 tablet (50 mg total) by mouth  2 (two) times daily. 180 tablet 3   hydrALAZINE (APRESOLINE) 100 MG tablet Take 1 tablet (100 mg total) by mouth every 8 (eight) hours. 270 tablet 3   levETIRAcetam (KEPPRA) 500 MG tablet Take 1 tablet (500 mg total) by mouth 2 (two) times daily. 180 tablet 4   magnesium oxide (MAG-OX) 400 MG tablet Take 1 tablet (400 mg total) by mouth daily. 30 tablet 0   metoprolol tartrate (LOPRESSOR) 50 MG tablet Take 1 & 1/2 tablets (75 mg total) by mouth 2 (two) times daily. 270 tablet 2   pantoprazole (PROTONIX) 40 MG tablet Take 1 tablet (40 mg total) by mouth at bedtime. 90 tablet 3   senna-docusate (SENOKOT-S) 8.6-50 MG tablet Take 2 tablets by mouth at bedtime as needed for mild  constipation. (Patient taking differently: Take 2 tablets by mouth See admin instructions. Take 2 tablets by mouth every other night) 100 tablet 3   tiZANidine (ZANAFLEX) 4 MG tablet Take 1 tablet (4 mg total) by mouth every 8 (eight) hours as needed for muscle spasms (spasticity/tightness). 90 tablet 5   traZODone (DESYREL) 150 MG tablet Take 0.5 tablets (75 mg total) by mouth at bedtime. 15 tablet 0   valsartan (DIOVAN) 80 MG tablet Take 1 tablet (80 mg total) by mouth daily. 90 tablet 3   No current facility-administered medications on file prior to visit.    Allergies  Allergen Reactions   Spironolactone     Social History   Socioeconomic History   Marital status: Married    Spouse name: Judeth Cornfield   Number of children: 1   Years of education: Not on file   Highest education level: Some college, no degree  Occupational History   Not on file  Tobacco Use   Smoking status: Never   Smokeless tobacco: Never  Vaping Use   Vaping status: Never Used  Substance and Sexual Activity   Alcohol use: Not Currently   Drug use: Never   Sexual activity: Not Currently  Other Topics Concern   Not on file  Social History Narrative   Lives with wife   Social Drivers of Health   Financial Resource Strain: Low Risk  (03/08/2023)   Overall Financial Resource Strain (CARDIA)    Difficulty of Paying Living Expenses: Not very hard  Food Insecurity: No Food Insecurity (03/08/2023)   Hunger Vital Sign    Worried About Running Out of Food in the Last Year: Never true    Ran Out of Food in the Last Year: Never true  Transportation Needs: No Transportation Needs (03/08/2023)   PRAPARE - Administrator, Civil Service (Medical): No    Lack of Transportation (Non-Medical): No  Physical Activity: Insufficiently Active (03/08/2023)   Exercise Vital Sign    Days of Exercise per Week: 1 day    Minutes of Exercise per Session: 10 min  Stress: No Stress Concern Present (03/08/2023)   [employer] of Occupational Health - Occupational Stress Questionnaire    Feeling of Stress : Only a little  Social Connections: Socially Isolated (03/08/2023)   Social Connection and Isolation Panel [NHANES]    Frequency of Communication with Friends and Family: Once a week    Frequency of Social Gatherings with Friends and Family: Once a week    Attends Religious Services: Never    Database administrator or Organizations: No    Attends Banker Meetings: Never    Marital Status: Married  Catering manager Violence: Not At  Risk (03/08/2023)   Humiliation, Afraid, Rape, and Kick questionnaire    Fear of Current or Ex-Partner: No    Emotionally Abused: No    Physically Abused: No    Sexually Abused: No    Family History  Problem Relation Age of Onset   Hypertension Mother    Hypertension Father    Hypertension Sister    Hypertension Brother     No past surgical history on file.  ROS: Review of Systems Negative except as stated above  PHYSICAL EXAM: BP 115/72 (BP Location: Left Arm, Patient Position: Sitting, Cuff Size: Normal)   Temp 97.8 F (36.6 C) (Oral)   Ht 5\' 4"  (1.626 m)   Wt 195 lb (88.5 kg)   BMI 33.47 kg/m   Wt Readings from Last 3 Encounters:  03/08/23 195 lb (88.5 kg)  12/18/22 203 lb 6.4 oz (92.3 kg)  11/05/22 200 lb (90.7 kg)    Physical Exam   General appearance - alert, well appearing, middle age obese AAM and in no distress Mental status - normal mood, behavior, speech, dress, motor activity, and thought processes Neck - supple, no significant adenopathy Chest - clear to auscultation, no wheezes, rales or rhonchi, symmetric air entry Heart - normal rate, regular rhythm, normal S1, S2, no murmurs, rubs, clicks or gallops Extremities - trace LE edema     Latest Ref Rng & Units 01/18/2023    3:10 PM 11/27/2022    1:45 PM 09/18/2022    2:42 PM  CMP  Glucose 65 - 99 mg/dL 80  55  51   BUN 7 - 25 mg/dL 26  21  20    Creatinine 0.70 - 1.30  mg/dL 4.09  8.11  9.14   Sodium 135 - 146 mmol/L 142  144  141   Potassium 3.5 - 5.3 mmol/L 4.2  4.5  4.0   Chloride 98 - 110 mmol/L 107  106  106   CO2 20 - 32 mmol/L 25  22  25    Calcium 8.6 - 10.3 mg/dL 9.5  9.5  9.1    Lipid Panel     Component Value Date/Time   CHOL 101 05/25/2021 0436   TRIG 24 05/25/2021 0436   HDL 47 05/25/2021 0436   CHOLHDL 2.1 05/25/2021 0436   VLDL 5 05/25/2021 0436   LDLCALC 49 05/25/2021 0436    CBC    Component Value Date/Time   WBC 8.7 05/24/2021 1609   RBC 4.84 05/24/2021 1609   HGB 14.6 05/24/2021 1641   HCT 43.0 05/24/2021 1641   PLT 253 05/24/2021 1609   MCV 88.0 05/24/2021 1609   MCH 30.0 05/24/2021 1609   MCHC 34.0 05/24/2021 1609   RDW 13.2 05/24/2021 1609   LYMPHSABS 1.3 05/24/2021 1609   MONOABS 0.6 05/24/2021 1609   EOSABS 0.3 05/24/2021 1609   BASOSABS 0.1 05/24/2021 1609    ASSESSMENT AND PLAN: 1. Resistant hypertension (Primary) Blood pressure today is at goal. He will continue Eplerenone 50 mg twice a day, hydralazine 100 mg 3 times a day, metoprolol 75 mg twice a day amlodipine 10 mg daily, Diovan 80 mg daily and clonidine patch 0.2 mg.  If blood pressure remains good, we should be able to decrease the clonidine to 0.1 mg on next visit. - Comprehensive metabolic panel - Lipid panel  2. Hyperaldosteronism (HCC) On a Eplerenone  3. Seizure disorder (HCC) Stable.  Continue Keppra  4. CKD stage 3b, GFR 30-44 ml/min (HCC) Advised patient to try to get  in at least 4 to 8 glasses of water daily especially with the dose of Eplerenone increased  5. Obesity (BMI 30.0-34.9) Commended him on trying to eat healthier.  Keep up the good works.  6. OSA on CPAP 7. Other fatigue Advised patient that the fatigue that he experiences after eating breakfast may be due to his medications in particular metoprolol and clonidine but also could be due to OSA that is not adequately treated.  Last sleep study he states was done in 2008.   Advised him to apply for orange card/cone discount.  Once approved we can refer him for sleep study to make sure that current pressure is adequate for him - CBC  8. Screening for colon cancer - Fecal occult blood, imunochemical([facility])  Addendum 03/10/2023: Patient with elevated alkaline phosphatase on chemistry.  Will add GGT to labs.  Patient was given the opportunity to ask questions.  Patient verbalized understanding of the plan and was able to repeat key elements of the plan.   This documentation was completed using Paediatric nurse.  Any transcriptional errors are unintentional.  Orders Placed This Encounter  Procedures   Fecal occult blood, imunochemical([facility]/Sun[facility])   Comprehensive metabolic panel   Lipid panel   CBC     Requested Prescriptions    No prescriptions requested or ordered in this encounter    Return in about 4 months (around 07/06/2023).  Jonah Blue, MD, FACP

## 2023-03-09 LAB — COMPREHENSIVE METABOLIC PANEL
ALT: 44 [IU]/L (ref 0–44)
AST: 30 [IU]/L (ref 0–40)
Albumin: 4.7 g/dL (ref 3.8–4.9)
Alkaline Phosphatase: 203 [IU]/L — ABNORMAL HIGH (ref 44–121)
BUN/Creatinine Ratio: 12 (ref 9–20)
BUN: 33 mg/dL — ABNORMAL HIGH (ref 6–24)
Bilirubin Total: 0.4 mg/dL (ref 0.0–1.2)
CO2: 21 mmol/L (ref 20–29)
Calcium: 9.2 mg/dL (ref 8.7–10.2)
Chloride: 102 mmol/L (ref 96–106)
Creatinine, Ser: 2.76 mg/dL — ABNORMAL HIGH (ref 0.76–1.27)
Globulin, Total: 3.4 g/dL (ref 1.5–4.5)
Glucose: 71 mg/dL (ref 70–99)
Potassium: 4.4 mmol/L (ref 3.5–5.2)
Sodium: 139 mmol/L (ref 134–144)
Total Protein: 8.1 g/dL (ref 6.0–8.5)
eGFR: 26 mL/min/{1.73_m2} — ABNORMAL LOW (ref >59)

## 2023-03-09 LAB — LIPID PANEL
Chol/HDL Ratio: 2.8 {ratio} (ref 0.0–5.0)
Cholesterol, Total: 107 mg/dL (ref 100–199)
HDL: 38 mg/dL — ABNORMAL LOW (ref >39)
LDL Chol Calc (NIH): 52 mg/dL (ref 0–99)
Triglycerides: 88 mg/dL (ref 0–149)
VLDL Cholesterol Cal: 17 mg/dL (ref 5–40)

## 2023-03-09 LAB — CBC
Hematocrit: 41.6 % (ref 37.5–51.0)
Hemoglobin: 14.1 g/dL (ref 13.0–17.7)
MCH: 29.9 pg (ref 26.6–33.0)
MCHC: 33.9 g/dL (ref 31.5–35.7)
MCV: 88 fL (ref 79–97)
Platelets: 224 10*3/uL (ref 150–450)
RBC: 4.71 x10E6/uL (ref 4.14–5.80)
RDW: 13.4 % (ref 11.6–15.4)
WBC: 7.8 10*3/uL (ref 3.4–10.8)

## 2023-03-10 ENCOUNTER — Telehealth: Payer: Self-pay | Admitting: Internal Medicine

## 2023-03-10 DIAGNOSIS — N1832 Chronic kidney disease, stage 3b: Secondary | ICD-10-CM

## 2023-03-10 NOTE — Addendum Note (Signed)
Addended by: Jonah Blue B on: 03/10/2023 11:52 AM   Modules accepted: Orders

## 2023-03-10 NOTE — Telephone Encounter (Signed)
Phone call placed to patient and his wife this morning to go over lab results. Patient informed that his kidney function has declined even further since being on the increased dose of the Eplerenone.  Currently on 50 mg BID.  I did touch base with Dr. Brooks Sailors about this and said this is expected and pt should increase water intake.  I recommend that pt change to taking the Eplerenone to 50 mg ONLY once a day on Mon/Wed/Fri/Sat and twice a day on the other 3 days.  Wife expressed understanding.  She fills pt's med box.  She was able to repeat back instructions to me. Pt advise to increase water intake to 4-8 glasses a day and return to lab [facility] Feb 3 for recheck kidney function with these changes.  Other labs revealed elevation in alkaline phosphatase of 202.  I will have the lab add a GGT level to this.  Blood cell count normal.  Cholesterol levels are good.  Results for orders placed or performed in visit on 03/08/23  Comprehensive metabolic panel   Collection Time: 03/08/23  3:44 PM  Result Value Ref Range   Glucose 71 70 - 99 mg/dL   BUN 33 (H) 6 - 24 mg/dL   Creatinine, Ser 1.02 (H) 0.76 - 1.27 mg/dL   eGFR 26 (L) >72 ZD/GUY/4.03   BUN/Creatinine Ratio 12 9 - 20   Sodium 139 134 - 144 mmol/L   Potassium 4.4 3.5 - 5.2 mmol/L   Chloride 102 96 - 106 mmol/L   CO2 21 20 - 29 mmol/L   Calcium 9.2 8.7 - 10.2 mg/dL   Total Protein 8.1 6.0 - 8.5 g/dL   Albumin 4.7 3.8 - 4.9 g/dL   Globulin, Total 3.4 1.5 - 4.5 g/dL   Bilirubin Total 0.4 0.0 - 1.2 mg/dL   Alkaline Phosphatase 203 (H) 44 - 121 IU/L   AST 30 0 - 40 IU/L   ALT 44 0 - 44 IU/L  Lipid panel   Collection Time: 03/08/23  3:44 PM  Result Value Ref Range   Cholesterol, Total 107 100 - 199 mg/dL   Triglycerides 88 0 - 149 mg/dL   HDL 38 (L) >47 mg/dL   VLDL Cholesterol Cal 17 5 - 40 mg/dL   LDL Chol Calc (NIH) 52 0 - 99 mg/dL   Chol/HDL Ratio 2.8 0.0 - 5.0 ratio  CBC   Collection Time: 03/08/23  3:44 PM  Result Value Ref  Range   WBC 7.8 3.4 - 10.8 x10E3/uL   RBC 4.71 4.14 - 5.80 x10E6/uL   Hemoglobin 14.1 13.0 - 17.7 g/dL   Hematocrit 42.5 95.6 - 51.0 %   MCV 88 79 - 97 fL   MCH 29.9 26.6 - 33.0 pg   MCHC 33.9 31.5 - 35.7 g/dL   RDW 38.7 56.4 - 33.2 %   Platelets 224 150 - 450 x10E3/uL

## 2023-03-11 LAB — GAMMA GT: GGT: 37 IU/L (ref 0–65)

## 2023-03-11 LAB — SPECIMEN STATUS REPORT

## 2023-03-17 ENCOUNTER — Other Ambulatory Visit: Payer: Self-pay | Admitting: Internal Medicine

## 2023-03-17 DIAGNOSIS — I6930 Unspecified sequelae of cerebral infarction: Secondary | ICD-10-CM

## 2023-03-17 DIAGNOSIS — F320 Major depressive disorder, single episode, mild: Secondary | ICD-10-CM

## 2023-03-18 ENCOUNTER — Other Ambulatory Visit: Payer: Self-pay

## 2023-03-18 MED ORDER — ATORVASTATIN CALCIUM 40 MG PO TABS
40.0000 mg | ORAL_TABLET | Freq: Every day | ORAL | 0 refills | Status: DC
  Filled 2023-03-18: qty 30, 30d supply, fill #0

## 2023-03-18 MED ORDER — CITALOPRAM HYDROBROMIDE 20 MG PO TABS
20.0000 mg | ORAL_TABLET | Freq: Every day | ORAL | 0 refills | Status: DC
  Filled 2023-03-18: qty 30, 30d supply, fill #0

## 2023-03-18 MED ORDER — BACLOFEN 10 MG PO TABS
10.0000 mg | ORAL_TABLET | Freq: Three times a day (TID) | ORAL | 0 refills | Status: DC
  Filled 2023-03-18: qty 90, 30d supply, fill #0

## 2023-03-18 MED ORDER — TRAZODONE HCL 150 MG PO TABS
75.0000 mg | ORAL_TABLET | Freq: Every day | ORAL | 1 refills | Status: DC
  Filled 2023-03-18: qty 45, 90d supply, fill #0
  Filled 2023-06-24: qty 45, 90d supply, fill #1

## 2023-03-18 NOTE — Telephone Encounter (Signed)
Requested by interface surescripts. Future visit in 3 months. Last labs1/2/ 25.  Requested Prescriptions  Pending Prescriptions Disp Refills   citalopram (CELEXA) 20 MG tablet 30 tablet 0    Sig: Take 1 tablet (20 mg total) by mouth daily.     Psychiatry:  Antidepressants - SSRI Passed - 03/18/2023 11:15 AM      Passed - Completed PHQ-2 or PHQ-9 in the last 360 days      Passed - Valid encounter within last 6 months    Recent Outpatient Visits           1 week ago Resistant hypertension   [place] [facility] Dept Of [place]. [facility] Marcine Matar, MD   4 months ago Resistant hypertension   [place] [facility] [place] - A Dept Of [place]. [facility] Marcine Matar, MD   11 months ago Essential hypertension   [place] [facility] [place] - A Dept Of [place]. [facility] Marcine Matar, MD   1 year ago Essential hypertension   [place] [facility] [place] - A Dept Of [place]. [facility] Marcine Matar, MD   1 year ago Essential hypertension   [place] [facility] [place] - A Dept Of [place]. [facility] Marcine Matar, MD       Future Appointments             In 3 months Laural Benes Binnie Rail, MD [facility] [place] - A Dept Of Eligha Bridegroom. [facility]             atorvastatin (LIPITOR) 40 MG tablet 30 tablet 0    Sig: Take 1 tablet (40 mg total) by mouth at bedtime.     Cardiovascular:  Antilipid - Statins Failed - 03/18/2023 11:15 AM      Failed - Lipid Panel in normal range within the last 12 months    Cholesterol, Total  Date Value Ref Range Status  03/08/2023 107 100 - 199 mg/dL Final   LDL Chol Calc (NIH)  Date Value Ref Range Status  03/08/2023 52 0 - 99 mg/dL Final   HDL  Date Value Ref Range Status  03/08/2023 38 (L) >39 mg/dL Final   Triglycerides  Date Value Ref Range Status  03/08/2023 88 0 - 149 mg/dL Final         Passed -  Patient is not pregnant      Passed - Valid encounter within last 12 months    Recent Outpatient Visits           1 week ago Resistant hypertension   [place] [facility] [place] - [facility] Dept Of [place]. [facility] Marcine Matar, MD   4 months ago Resistant hypertension   [place] [facility] [place] - A Dept Of [place]. [facility] Marcine Matar, MD   11 months ago Essential hypertension   [place] [facility] [place] - A Dept Of [place]. [facility] Marcine Matar, MD   1 year ago Essential hypertension   [place] [facility] [place] - A Dept Of [place]. [facility] Marcine Matar, MD   1 year ago Essential hypertension   [place] [facility] [place] - A Dept Of . [facility] Marcine Matar, MD       Future Appointments  In 3 months Marcine Matar, MD [facility] [place] - A Dept Of [place]igh[place] Bridegroom. [facility]             baclofen (LIORESAL) 10 MG tablet 90 tablet 0    Sig: Take 1 tablet (10 mg total) by mouth 3 (three) times daily.     Analgesics:  Muscle Relaxants - baclofen Failed - 03/18/2023 11:15 AM      Failed - Cr in normal range and within 180 days    Creat  Date Value Ref Range Status  01/18/2023 2.03 (H) 0.70 - 1.30 mg/dL Final   Creatinine, Ser  Date Value Ref Range Status  03/08/2023 2.76 (H) 0.76 - 1.27 mg/dL Final   Creatinine, Urine  Date Value Ref Range Status  04/17/2021 130.57 mg/dL Final         Failed - eGFR is 30 or above and within 180 days    GFR, Estimated  Date Value Ref Range Status  05/24/2021 48 (L) >60 mL/min Final    Comment:    (NOTE) Calculated using the CKD-EPI Creatinine Equation (2021)    eGFR  Date Value Ref Range Status  03/08/2023 26 (L) >59 mL/min/1.73 Final         Passed - Valid encounter within last 6 months    Recent Outpatient Visits           1 week ago Resistant hypertension    [place] [facility] [place] - A Dept Of [place]. [facility] Marcine Matar, MD   4 months ago Resistant hypertension   [place] [facility] [place] - A Dept Of [place]. [facility] Marcine Matar, MD   11 months ago Essential hypertension   [place] [facility] [place] - A Dept Of [place]. [facility] Marcine Matar, MD   1 year ago Essential hypertension   [place] [facility] [place] - A Dept Of [place]. [facility] Marcine Matar, MD   1 year ago Essential hypertension   [place] [facility] [place] - A Dept Of [place]. [facility] Marcine Matar, MD       Future Appointments             In 3 months Laural Benes Binnie Rail, MD [facility] - A Dept Of Eligha Bridegroom. [facility]             traZODone (DESYREL) 150 MG tablet 15 tablet 0    Sig: Take 0.5 tablets (75 mg total) by mouth at bedtime.     Psychiatry: Antidepressants - Serotonin Modulator Passed - 03/18/2023 11:15 AM      Passed - Completed PHQ-2 or PHQ-9 in the last 360 days      Passed - Valid encounter within last 6 months    Recent Outpatient Visits           1 week ago Resistant hypertension   [place] [facility] [place] - A Dept Of [place]. [facility] Marcine Matar, MD   4 months ago Resistant hypertension   [place] [facility] [place] - A Dept Of [place]. [facility] Marcine Matar, MD   11 months ago Essential hypertension   [place] [facility] [place] - A Dept Of [place]. [facility] Marcine Matar, MD   1 year ago Essential hypertension    Comm  [facility] [place] - A Dept Of [place]. [facility] Marcine Matar, MD   1 year ago Essential hypertension   [place] [facility] [place] - A Dept Of [place]. [facility] Marcine Matar, MD       Future Appointments             In 3 months Laural Benes  Binnie Rail, MD [facility] [place] - A Dept Of [place]igha Bridegroom. [facility]

## 2023-03-18 NOTE — Telephone Encounter (Signed)
Requested medication (s) are due for refill today: yes  Requested medication (s) are on the active medication list: yes  Last refill:  baclofen- 02/17/23 # 90 0 refills , trazodone - 02/17/23 #15 0 refills   Future visit scheduled: yes in 3 months   Notes to clinic:  no refills remain. Do you want to refill Rxs?     Requested Prescriptions  Pending Prescriptions Disp Refills   baclofen (LIORESAL) 10 MG tablet 90 tablet 0    Sig: Take 1 tablet (10 mg total) by mouth 3 (three) times daily.     Analgesics:  Muscle Relaxants - baclofen Failed - 03/18/2023 11:16 AM      Failed - Cr in normal range and within 180 days    Creat  Date Value Ref Range Status  01/18/2023 2.03 (H) 0.70 - 1.30 mg/dL Final   Creatinine, Ser  Date Value Ref Range Status  03/08/2023 2.76 (H) 0.76 - 1.27 mg/dL Final   Creatinine, Urine  Date Value Ref Range Status  04/17/2021 130.57 mg/dL Final         Failed - eGFR is 30 or above and within 180 days    GFR, Estimated  Date Value Ref Range Status  05/24/2021 48 (L) >60 mL/min Final    Comment:    (NOTE) Calculated using the CKD-EPI Creatinine Equation (2021)    eGFR  Date Value Ref Range Status  03/08/2023 26 (L) >59 mL/min/1.73 Final         Passed - Valid encounter within last 6 months    Recent Outpatient Visits           1 week ago Resistant hypertension   [place] [facility] [place] Dept Of [place]. [facility] Marcine Matar, MD   4 months ago Resistant hypertension   [place] [facility] [place] - A Dept Of [place]. [facility] Marcine Matar, MD   11 months ago Essential hypertension   [place] [facility] [place] - A Dept Of [place]. [facility] Marcine Matar, MD   1 year ago Essential hypertension   [place] [facility] [place] - A Dept Of [place]. [facility] Marcine Matar, MD   1 year ago Essential hypertension   [place] [facility] [place] - A Dept  Of [place]. [facility] Marcine Matar, MD       Future Appointments             In 3 months Laural Benes Binnie Rail, MD [facility] [place] [place] - A Dept Of Eligha Bridegroom. [facility]             traZODone (DESYREL) 150 MG tablet 15 tablet 0    Sig: Take 0.5 tablets (75 mg total) by mouth at bedtime.     Psychiatry: Antidepressants - Serotonin Modulator Passed - 03/18/2023 11:16 AM      Passed - Completed PHQ-2 or PHQ-9 in the last 360 days      Passed - Valid encounter within last 6 months    Recent Outpatient Visits           1 week ago Resistant hypertension   [place] [facility] [place] - A Dept Of [place]. [facility] Marcine Matar, MD   4 months ago Resistant hypertension   [place] [facility] [place] [place] - A Dept Of . [facility] Jonah Blue  B, MD   11 months ago Essential hypertension   [place] [facility] [place] - A Dept Of [place]. [facility] Marcine Matar, MD   1 year ago Essential hypertension   [place] [facility] [place] - A Dept Of [place]. [facility] Marcine Matar, MD   1 year ago Essential hypertension   [place] [facility] [place] - A Dept Of [place]. [facility] Marcine Matar, MD       Future Appointments             In 3 months Laural Benes Binnie Rail, MD [facility] [place] [place] - A Dept Of [place] Brideg[place]. [facility]            Signed Prescriptions Disp Refills   citalopram (CELEXA) 20 MG tablet 30 tablet 0    Sig: Take 1 tablet (20 mg total) by mouth daily.     Psychiatry:  Antidepressants - SSRI Passed - 03/18/2023 11:16 AM      Passed - Completed PHQ-2 or PHQ-9 in the last 360 days      Passed - Valid encounter within last 6 months    Recent Outpatient Visits           1 week ago Resistant hypertension   [place] [facility] [place] - [facility] Dept Of [place]. [facility] Marcine Matar, MD    4 months ago Resistant hypertension   [place] [facility] [place] - [facility] Dept Of [place]. [facility] Marcine Matar, MD   11 months ago Essential hypertension   [place] [facility] [place] - A Dept Of [place]. [facility] Marcine Matar, MD   1 year ago Essential hypertension   [place] [facility] [place] - A Dept Of [place]. [facility] Marcine Matar, MD   1 year ago Essential hypertension   [place] [facility] [place] - A Dept Of [place]. [facility] Marcine Matar, MD       Future Appointments             In 3 months Laural Benes Binnie Rail, MD [facility] [place] [place] - A Dept Of [place]igh[place] Bridegroom. [facility]             atorvastatin (LIPITOR) 40 MG tablet 30 tablet 0    Sig: Take 1 tablet (40 mg total) by mouth at bedtime.     Cardiovascular:  Antilipid - Statins Failed - 03/18/2023 11:16 AM      Failed - Lipid Panel in normal range within the last 12 months    Cholesterol, Total  Date Value Ref Range Status  03/08/2023 107 100 - 199 mg/dL Final   LDL Chol Calc (NIH)  Date Value Ref Range Status  03/08/2023 52 0 - 99 mg/dL Final   HDL  Date Value Ref Range Status  03/08/2023 38 (L) >39 mg/dL Final   Triglycerides  Date Value Ref Range Status  03/08/2023 88 0 - 149 mg/dL Final         Passed - Patient is not pregnant      Passed - Valid encounter within last 12 months    Recent Outpatient Visits           1 week ago Resistant hypertension    [facility] [place] - A Dept Of [place]. [facility] [place], [place] B,  MD   4 months ago Resistant hypertension   [place] [facility] [place] - A Dept Of [place]. [facility] Marcine Matar, MD   11 months ago Essential hypertension   [place] [facility] [place] - A Dept Of [place]. [facility] Marcine Matar, MD   1 year ago Essential hypertension   [place] [facility]  [place] - A Dept Of [place]. [facility] Marcine Matar, MD   1 year ago Essential hypertension   [place] [facility] [place] - A Dept Of [place]. [facility] Marcine Matar, MD       Future Appointments             In 3 months Laural Benes Binnie Rail, MD [facility] [place] - A Dept Of [place]igha Bridegroom. [facility]

## 2023-03-24 ENCOUNTER — Other Ambulatory Visit: Payer: Self-pay

## 2023-03-24 ENCOUNTER — Telehealth: Payer: Self-pay | Admitting: Internal Medicine

## 2023-03-24 ENCOUNTER — Ambulatory Visit: Payer: Self-pay | Attending: Family Medicine

## 2023-03-24 DIAGNOSIS — N1832 Chronic kidney disease, stage 3b: Secondary | ICD-10-CM

## 2023-03-24 NOTE — Telephone Encounter (Signed)
 Phone call placed to patient this morning.  I left a voicemail message on both mobile numbers listed in the chart reminding him that he was supposed to have return to the lab 2 days ago to have blood test done to recheck his kidney function.  Advised that he please come to the lab this week to have it done.

## 2023-03-25 ENCOUNTER — Encounter: Payer: Self-pay | Admitting: Internal Medicine

## 2023-03-25 LAB — BASIC METABOLIC PANEL
BUN/Creatinine Ratio: 7 — ABNORMAL LOW (ref 9–20)
BUN: 15 mg/dL (ref 6–24)
CO2: 19 mmol/L — ABNORMAL LOW (ref 20–29)
Calcium: 9.2 mg/dL (ref 8.7–10.2)
Chloride: 105 mmol/L (ref 96–106)
Creatinine, Ser: 2.03 mg/dL — ABNORMAL HIGH (ref 0.76–1.27)
Glucose: 109 mg/dL — ABNORMAL HIGH (ref 70–99)
Potassium: 4.8 mmol/L (ref 3.5–5.2)
Sodium: 143 mmol/L (ref 134–144)
eGFR: 38 mL/min/{1.73_m2} — ABNORMAL LOW (ref >59)

## 2023-03-25 LAB — FECAL OCCULT BLOOD, IMMUNOCHEMICAL

## 2023-03-27 ENCOUNTER — Encounter: Payer: Self-pay | Admitting: Internal Medicine

## 2023-03-27 NOTE — Progress Notes (Signed)
 Advise pt change Eplerenone  to 50 mg ONLY ONCE A DAY on Mon/Wed/Fri/Sat/Sun and twice a day on all other days (Tue and Thursday).  Try to speak with his wife as she is the one who sets up his med box every week.

## 2023-04-01 ENCOUNTER — Other Ambulatory Visit: Payer: Self-pay

## 2023-04-06 ENCOUNTER — Other Ambulatory Visit: Payer: Self-pay

## 2023-04-19 ENCOUNTER — Ambulatory Visit: Payer: Self-pay | Admitting: Internal Medicine

## 2023-04-19 NOTE — Progress Notes (Deleted)
 Name: Miguel Mccullough  MRN/ DOB: [id number], Oct 27, 1965    Age/ Sex: 58 y.o., male    PCP: Marcine Matar, MD   Reason for Endocrinology Evaluation: Hyperaldosteronism     Date of Initial Endocrinology Evaluation: 09/18/2022    HPI: [name] is a 58 y.o. male with a past medical history of HTN, CKD, CVA. The patient presented for initial endocrinology clinic visit on 09/18/2022 for consultative assistance with his hyperaldosteronism.   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  24-hour urinary cortisol was normal at 30.7 mcg 09/2022  SUBJECTIVE:    Today (04/19/23):  Miguel Mccullough is here for follow-up on hypertension.   BP at home 146/93 mmhg   Denies headaches or vision changes  Denies chest pain  Stable  occasional left LE swelling     Diovan 80 mg daily Hydralazine 100 mg 3 times daily Eplerenone 50 mg daily Amlodipine 10 mg daily Metoprolol 50 mg, 1.5 tabs  BID Clonidine 0.2 mg patch every Friday      HISTORY:  Past Medical History:  Past Medical History:  Diagnosis Date   COVID-19 2021   continues to have increase in suptum production.   Hypertension    TIA (transient ischemic attack)    Past Surgical History: No past surgical history on file.  Social History:  reports that he has never smoked. He has never used smokeless tobacco. He reports that he does not currently use alcohol. He reports that he does not use drugs. Family History: family history includes  Hypertension in his brother, father, mother, and sister.   HOME MEDICATIONS: Allergies as of 04/19/2023       Reactions   Spironolactone         Medication List        Accurate as of April 19, 2023  9:58 AM. If you have any questions, ask your nurse or doctor.          amantadine 100 MG capsule Commonly known as: SYMMETREL Take 1 capsule (100 mg total) by mouth daily.   amLODipine 10 MG tablet Commonly known as: NORVASC Take 1 tablet (10 mg total) by mouth at bedtime.   aspirin EC 81 MG tablet Take 81 mg by mouth daily. Swallow whole.   atorvastatin 40 MG tablet Commonly known as: LIPITOR Take 1 tablet (40 mg total) by mouth at bedtime.   b complex vitamins capsule Take 1 capsule by mouth daily.   baclofen 10 MG tablet Commonly known as: LIORESAL Take 1 tablet (10 mg total) by mouth 3 (three) times daily.   citalopram 20 MG tablet Commonly known as: CELEXA Take 1 tablet (20 mg total) by mouth daily.   cloNIDine 0.2 mg/24hr patch Commonly known as: CATAPRES - Dosed in mg/24 hr Place 1 patch (0.2 mg total) onto the skin once a week.   eplerenone 50 MG tablet Commonly known as: INSPRA Take 1 tablet (50 mg total) by mouth 2 (two) times daily.  hydrALAZINE 100 MG tablet Commonly known as: APRESOLINE Take 1 tablet (100 mg total) by mouth every 8 (eight) hours.   levETIRAcetam 500 MG tablet Commonly known as: KEPPRA Take 1 tablet (500 mg total) by mouth 2 (two) times daily.   magnesium oxide 400 MG tablet Commonly known as: MAG-OX Take 1 tablet (400 mg total) by mouth daily.   metoprolol tartrate 50 MG tablet Commonly known as: LOPRESSOR Take 1 & 1/2 tablets (75 mg total) by mouth 2 (two) times daily.   pantoprazole 40 MG tablet Commonly known as: PROTONIX Take 1 tablet (40 mg total) by mouth at bedtime.   Stimulant Laxative 8.6-50 MG tablet Generic drug: senna-docusate Take 2 tablets by mouth at bedtime as needed for mild constipation. What  changed:  when to take this additional instructions   tiZANidine 4 MG tablet Commonly known as: Zanaflex Take 1 tablet (4 mg total) by mouth every 8 (eight) hours as needed for muscle spasms (spasticity/tightness).   traZODone 150 MG tablet Commonly known as: DESYREL Take 0.5 tablets (75 mg total) by mouth at bedtime.   valsartan 80 MG tablet Commonly known as: Diovan Take 1 tablet (80 mg total) by mouth daily.          REVIEW OF SYSTEMS: A comprehensive ROS was conducted with the patient and is negative except as per HPI    OBJECTIVE:  VS: There were no vitals taken for this visit.   Wt Readings from Last 3 Encounters:  03/08/23 195 lb (88.5 kg)  12/18/22 203 lb 6.4 oz (92.3 kg)  11/05/22 200 lb (90.7 kg)     EXAM: General: Pt appears well and is in NAD  Lungs: Clear with good BS bilat   Heart: Auscultation: RRR.  Extremities:  BL LE: 1+ pretibial edema on the left   Mental Status: Judgment, insight: Intact Orientation: Oriented to time, place, and person Mood and affect: No depression, anxiety, or agitation     DATA REVIEWED:   Latest Reference Range & Units 03/24/23 11:49  Sodium 134 - 144 mmol/L 143  Potassium 3.5 - 5.2 mmol/L 4.8  Chloride 96 - 106 mmol/L 105  CO2 20 - 29 mmol/L 19 (L)  Glucose 70 - 99 mg/dL 914 (H)  BUN 6 - 24 mg/dL 15  Creatinine 7.82 - 9.56 mg/dL 2.13 (H)  Calcium 8.7 - 10.2 mg/dL 9.2  BUN/Creatinine Ratio 9 - 20  7 (L)  eGFR >59 mL/min/1.73 38 (L)  (L): Data is abnormally low (H): Data is abnormally high    Old records , labs and images have been reviewed.    ASSESSMENT/PLAN/RECOMMENDATIONS:   Resistant hypertension:  -Patient with abnormal Aldo/PRA ratio, we discussed that the patient had abnormal screening test, we discussed that typically confirmation is the neck step with either salt or saline loading, this is to be performed after being off eplerenone for approximately 6 weeks.  Given that the patient had a  hemorrhagic stroke last year and the risk of elevated BP during this process, we opted to treat the patient medically at this time and avoid invasive procedures. -24-hour urinary cortisol normal -Will recheck metanephrines in 4 weeks -I have recommended increasing eplerenone as below, caution against dehydration, encouraged hydration  Medications :  Continue clonidine 0.2 mg patch weekly Increase eplerenone 50 mg BID  Continue Diovan 80 mg daily Continue hydralazine 100 mg 3 times daily Continue metoprolol 50 mg, 1.5 tablets twice daily Continue amlodipine 10 mg daily     2. Low  GFR:  -His GFR has trended down -I have encouraged hydration, I will increase his eplerenone which could reduce his GFR further -We will recheck in 4 weeks   Follow-up in 4 months    Signed electronically by: Lyndle Herrlich, MD  [facility] Endocrinology  [facility] [address] [place], [place] [address] Phone: [phone] FAX: [phone]   CC: Marcine Matar, MD [address] [place] [address] [place] [address] Phone: [phone] Fax: [phone]   Return to Endocrinology clinic as below: Future Appointments  Date Time Provider Department Center  04/19/2023  2:20 PM Tanisha Lutes, Konrad Dolores, MD [id number] None  07/06/2023  3:50 PM Marcine Matar, MD [id number] None

## 2023-04-20 ENCOUNTER — Other Ambulatory Visit: Payer: Self-pay | Admitting: Internal Medicine

## 2023-04-20 DIAGNOSIS — F320 Major depressive disorder, single episode, mild: Secondary | ICD-10-CM

## 2023-04-20 DIAGNOSIS — I6930 Unspecified sequelae of cerebral infarction: Secondary | ICD-10-CM

## 2023-04-21 ENCOUNTER — Other Ambulatory Visit: Payer: Self-pay

## 2023-04-21 MED ORDER — CITALOPRAM HYDROBROMIDE 20 MG PO TABS
20.0000 mg | ORAL_TABLET | Freq: Every day | ORAL | 0 refills | Status: DC
  Filled 2023-04-21: qty 30, 30d supply, fill #0

## 2023-04-21 MED ORDER — BACLOFEN 10 MG PO TABS
10.0000 mg | ORAL_TABLET | Freq: Three times a day (TID) | ORAL | 0 refills | Status: DC
  Filled 2023-04-21: qty 90, 30d supply, fill #0

## 2023-04-21 MED ORDER — ATORVASTATIN CALCIUM 40 MG PO TABS
40.0000 mg | ORAL_TABLET | Freq: Every day | ORAL | 0 refills | Status: DC
  Filled 2023-04-21: qty 30, 30d supply, fill #0

## 2023-04-21 MED ORDER — MAGNESIUM OXIDE 400 MG PO TABS
400.0000 mg | ORAL_TABLET | Freq: Every day | ORAL | 0 refills | Status: DC
  Filled 2023-04-21: qty 30, 30d supply, fill #0

## 2023-04-23 ENCOUNTER — Other Ambulatory Visit (HOSPITAL_COMMUNITY): Payer: Self-pay

## 2023-04-23 ENCOUNTER — Other Ambulatory Visit: Payer: Self-pay

## 2023-05-04 ENCOUNTER — Other Ambulatory Visit: Payer: Self-pay

## 2023-05-04 ENCOUNTER — Other Ambulatory Visit: Payer: Self-pay | Admitting: Internal Medicine

## 2023-05-04 MED ORDER — PANTOPRAZOLE SODIUM 40 MG PO TBEC
40.0000 mg | DELAYED_RELEASE_TABLET | Freq: Every day | ORAL | 0 refills | Status: DC
  Filled 2023-05-04: qty 90, 90d supply, fill #0

## 2023-05-05 ENCOUNTER — Other Ambulatory Visit: Payer: Self-pay

## 2023-05-12 ENCOUNTER — Other Ambulatory Visit: Payer: Self-pay

## 2023-05-18 ENCOUNTER — Other Ambulatory Visit: Payer: Self-pay | Admitting: Internal Medicine

## 2023-05-18 ENCOUNTER — Other Ambulatory Visit: Payer: Self-pay | Admitting: Physical Medicine and Rehabilitation

## 2023-05-18 DIAGNOSIS — F320 Major depressive disorder, single episode, mild: Secondary | ICD-10-CM

## 2023-05-18 DIAGNOSIS — I6930 Unspecified sequelae of cerebral infarction: Secondary | ICD-10-CM

## 2023-05-19 ENCOUNTER — Other Ambulatory Visit: Payer: Self-pay

## 2023-05-19 MED ORDER — ATORVASTATIN CALCIUM 40 MG PO TABS
40.0000 mg | ORAL_TABLET | Freq: Every day | ORAL | 1 refills | Status: DC
  Filled 2023-05-19: qty 90, 90d supply, fill #0
  Filled 2023-08-14 – 2023-08-17 (×3): qty 90, 90d supply, fill #1

## 2023-05-19 MED ORDER — CITALOPRAM HYDROBROMIDE 20 MG PO TABS
20.0000 mg | ORAL_TABLET | Freq: Every day | ORAL | 1 refills | Status: DC
  Filled 2023-05-19: qty 90, 90d supply, fill #0
  Filled 2023-07-29 – 2023-08-16 (×3): qty 90, 90d supply, fill #1

## 2023-05-20 ENCOUNTER — Other Ambulatory Visit: Payer: Self-pay

## 2023-05-21 ENCOUNTER — Other Ambulatory Visit: Payer: Self-pay | Admitting: Physical Medicine and Rehabilitation

## 2023-05-26 ENCOUNTER — Other Ambulatory Visit: Payer: Self-pay | Admitting: Internal Medicine

## 2023-05-26 ENCOUNTER — Other Ambulatory Visit: Payer: Self-pay

## 2023-05-26 ENCOUNTER — Other Ambulatory Visit: Payer: Self-pay | Admitting: Physical Medicine and Rehabilitation

## 2023-05-26 ENCOUNTER — Encounter: Payer: Self-pay | Admitting: Internal Medicine

## 2023-05-26 NOTE — Telephone Encounter (Signed)
 Requested medication (s) are due for refill today: Yes  Requested medication (s) are on the active medication list: Yes  Last refill:  09/18/22  Future visit scheduled: Yes  Notes to clinic:  Unable to refill due to no refill protocol for this medication.      Requested Prescriptions  Pending Prescriptions Disp Refills   amantadine (SYMMETREL) 100 MG capsule 30 capsule 5    Sig: Take 1 capsule (100 mg total) by mouth daily.     Off-Protocol Failed - 05/26/2023  2:14 PM      Failed - Medication not assigned to a protocol, review manually.      Passed - Valid encounter within last 12 months    Recent Outpatient Visits           2 months ago Resistant hypertension   [place] [facility] [place] - [facility] Dept Of [place]. [facility] Marcine Matar, MD   6 months ago Resistant hypertension   [place] [facility] [place] - [facility] Dept Of [place]. [facility] Marcine Matar, MD   1 year ago Essential hypertension   [place] [facility] [place] - A Dept Of [place]. [facility] Marcine Matar, MD   1 year ago Essential hypertension   [place] [facility] [place] - A Dept Of [place]. [facility] Marcine Matar, MD   1 year ago Essential hypertension   [place] [facility] [place] - A Dept Of [place]. [facility] Marcine Matar, MD       Future Appointments             In 1 month Laural Benes Binnie Rail, MD [facility] [place] - A Dept Of Eligha Bridegroom. [facility]

## 2023-05-27 ENCOUNTER — Other Ambulatory Visit: Payer: Self-pay

## 2023-05-30 ENCOUNTER — Other Ambulatory Visit: Payer: Self-pay | Admitting: Physical Medicine and Rehabilitation

## 2023-05-30 ENCOUNTER — Other Ambulatory Visit: Payer: Self-pay | Admitting: Internal Medicine

## 2023-05-30 DIAGNOSIS — I6930 Unspecified sequelae of cerebral infarction: Secondary | ICD-10-CM

## 2023-05-31 ENCOUNTER — Other Ambulatory Visit: Payer: Self-pay

## 2023-05-31 ENCOUNTER — Other Ambulatory Visit: Payer: Self-pay | Admitting: Internal Medicine

## 2023-05-31 DIAGNOSIS — I6930 Unspecified sequelae of cerebral infarction: Secondary | ICD-10-CM

## 2023-06-01 ENCOUNTER — Other Ambulatory Visit: Payer: Self-pay

## 2023-06-02 ENCOUNTER — Other Ambulatory Visit: Payer: Self-pay

## 2023-06-09 ENCOUNTER — Other Ambulatory Visit: Payer: Self-pay

## 2023-06-10 ENCOUNTER — Other Ambulatory Visit: Payer: Self-pay

## 2023-06-18 ENCOUNTER — Other Ambulatory Visit: Payer: Self-pay

## 2023-06-24 ENCOUNTER — Other Ambulatory Visit: Payer: Self-pay

## 2023-06-25 ENCOUNTER — Other Ambulatory Visit: Payer: Self-pay

## 2023-07-05 ENCOUNTER — Other Ambulatory Visit: Payer: Self-pay | Admitting: Internal Medicine

## 2023-07-05 DIAGNOSIS — I10 Essential (primary) hypertension: Secondary | ICD-10-CM

## 2023-07-06 ENCOUNTER — Other Ambulatory Visit: Payer: Self-pay

## 2023-07-06 ENCOUNTER — Ambulatory Visit: Payer: Self-pay | Attending: Internal Medicine | Admitting: Internal Medicine

## 2023-07-06 ENCOUNTER — Encounter: Payer: Self-pay | Admitting: Internal Medicine

## 2023-07-06 VITALS — BP 101/67 | HR 68 | Temp 98.1°F | Ht 64.0 in | Wt 203.0 lb

## 2023-07-06 DIAGNOSIS — G40909 Epilepsy, unspecified, not intractable, without status epilepticus: Secondary | ICD-10-CM

## 2023-07-06 DIAGNOSIS — I10 Essential (primary) hypertension: Secondary | ICD-10-CM

## 2023-07-06 DIAGNOSIS — E269 Hyperaldosteronism, unspecified: Secondary | ICD-10-CM

## 2023-07-06 DIAGNOSIS — I129 Hypertensive chronic kidney disease with stage 1 through stage 4 chronic kidney disease, or unspecified chronic kidney disease: Secondary | ICD-10-CM

## 2023-07-06 DIAGNOSIS — F320 Major depressive disorder, single episode, mild: Secondary | ICD-10-CM

## 2023-07-06 DIAGNOSIS — I69398 Other sequelae of cerebral infarction: Secondary | ICD-10-CM

## 2023-07-06 DIAGNOSIS — R252 Cramp and spasm: Secondary | ICD-10-CM

## 2023-07-06 DIAGNOSIS — N1832 Chronic kidney disease, stage 3b: Secondary | ICD-10-CM

## 2023-07-06 MED ORDER — METOPROLOL TARTRATE 50 MG PO TABS
75.0000 mg | ORAL_TABLET | Freq: Two times a day (BID) | ORAL | 1 refills | Status: DC
  Filled 2023-07-06: qty 270, 90d supply, fill #0
  Filled 2023-09-18: qty 270, 90d supply, fill #1

## 2023-07-06 NOTE — Patient Instructions (Signed)
 VISIT SUMMARY:  Miguel Mccullough, a 58 year old male with hypertension and seizure disorder, came in for a follow-up visit. His blood pressure is well-controlled, and he has had no recent seizures. He is managing his muscle spasms with tizanidine  and continues physical therapy for stroke recovery. He is starting a new job in July, which will provide health benefits.  YOUR PLAN:  -HYPERTENSION: Hypertension means high blood pressure. Your blood pressure is well-controlled with your current medications. Continue taking amlodipine , hydralazine , Diovan , metoprolol , and ipirinone as prescribed. We will recheck your kidney function later this week or next week to ensure the medications are not affecting it adversely.  -CHRONIC KIDNEY DISEASE, UNSPECIFIED: Chronic kidney disease means your kidneys are not working as well as they should. We need to recheck your kidney function later this week or next week to monitor any changes.  -SEIZURE DISORDER: A seizure disorder means you have a tendency to have seizures. You have not had any recent seizures while taking Keppra . Continue taking Keppra  as prescribed.  -STROKE: A stroke occurs when blood flow to a part of your brain is stopped either by a blockage or a burst blood vessel. You have no new neurological symptoms and are continuing physical therapy. Keep up with your physical therapy sessions at [facility] and [facility].  -MUSCLE SPASTICITY: Muscle spasticity means you have tight or stiff muscles. It is being managed effectively with tizanidine . Continue taking tizanidine  and send a MyChart message when you need a refill.  -HYPERLIPIDEMIA: Hyperlipidemia means you have high cholesterol levels. Your cholesterol is being managed with atorvastatin . Continue taking atorvastatin  as prescribed.  INSTRUCTIONS:  Please recheck your kidney function later this week or next week. Continue with your current medications and physical therapy. Send a MyChart message  for a tizanidine  refill when needed.

## 2023-07-06 NOTE — Progress Notes (Signed)
 Patient ID: Miguel Mccullough, male    DOB: [birth year]  MRN: [id number]  CC: Hypertension (HTN f/u. /No questions / concerns/No to shingles vax)   Subjective: Miguel Mccullough is a 58 y.o. male who presents for chronic ds management. Wife is with him. 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 Salli Crawley 06/18/2021.  Started on Keppra ).   Discussed the use of AI scribe software for clinical note transcription with the patient, who gave verbal consent to proceed.  History of Present Illness Miguel Mccullough is a 58 year old male with hypertension and seizure disorder who presents for a follow-up visit.  HTN/hyperaldosteronism: His blood pressure is well-controlled at home, measuring around 113/68 to 113/70 mmHg. He takes amlodipine , hydralazine , Diovan , metoprolol , and Eplerenon with a modified dosing schedule due to kidney function concerns. He experiences no chest pain or shortness of breath and limits salt intake.  He missed last appointment with Dr. Hershal Loron but has one coming up next month.  SZ disorder/Hx CVA: He manages his seizure disorder with Keppra  and has had no recent seizures. He previously used amantadine  but is currently managing without it. -He engages in physical therapy twice a week, focusing on walking, resistance training, and flexibility exercises. He uses a cane outside but not at home. No falls or significant depressive symptoms, though he occasionally feels down. -He takes tizanidine  for muscle spasms, which is effective. He previously was on both tizanidine  and baclofen  through PMR.  He has ran out of the baclofen  and it was not approved for refill so he continues with the tizanidine .  His wife is wanting to know whether he would be able to get refills on the tizanidine  from me once he runs out.  This should not be a problem.   Depression screen positive.  He  remains on Celexa .  Reports that depression is not a major issue for him at this time but his wife states that he has good days and bad days at times.  He does not feel that he needs any new medication or counseling.  Wife is starting a new job in July, which should provide health benefits, and was previously denied Medicaid due to income limits.    Patient Active Problem List   Diagnosis Date Noted   OSA on CPAP 03/08/2023   Hyperaldosteronism (HCC) 12/18/2022   Neglect of one side of body 06/13/2021   Abnormality of gait 06/13/2021   AMS (altered mental status) 05/24/2021   Aphasia 05/24/2021   History of intracranial hemorrhage 05/24/2021   Resistant hypertension 05/24/2021   Stage 3b chronic kidney disease (HCC) 05/20/2021   Hypokalemia 05/20/2021   Mild major depression (HCC) 05/20/2021   Acute renal failure (HCC) 05/15/2021   Elevated lipids 05/15/2021   Spasticity 05/15/2021   Depression 05/15/2021   Constipation 05/15/2021   ICH (intracerebral hemorrhage) (HCC) 04/13/2021   Hypercholesterolemia 07/08/2017   Essential hypertension 02/20/2016     Current Outpatient Medications on File Prior to Visit  Medication Sig Dispense Refill   amLODipine  (NORVASC ) 10 MG tablet Take 1 tablet (10 mg total) by mouth at bedtime. 90 tablet 3   aspirin  EC 81 MG tablet Take 81 mg by mouth daily. Swallow whole.     atorvastatin  (LIPITOR) 40 MG tablet Take 1 tablet (40 mg total) by mouth at bedtime. 90 tablet 1   b complex  vitamins capsule Take 1 capsule by mouth daily.     citalopram  (CELEXA ) 20 MG tablet Take 1 tablet (20 mg total) by mouth daily. 90 tablet 1   cloNIDine  (CATAPRES  - DOSED IN MG/24 HR) 0.2 mg/24hr patch Place 1 patch (0.2 mg total) onto the skin once a week. 4 patch 12   eplerenone  (INSPRA ) 50 MG tablet Take 1 tablet (50 mg total) by mouth 2 (two) times daily. 180 tablet 3   hydrALAZINE  (APRESOLINE ) 100 MG tablet Take 1 tablet (100 mg total) by mouth every 8 (eight) hours.  270 tablet 3   levETIRAcetam  (KEPPRA ) 500 MG tablet Take 1 tablet (500 mg total) by mouth 2 (two) times daily. 180 tablet 4   magnesium  oxide (MAG-OX) 400 MG tablet Take 1 tablet (400 mg total) by mouth daily. 30 tablet 0   metoprolol  tartrate (LOPRESSOR ) 50 MG tablet Take 1 & 1/2 tablets (75 mg total) by mouth 2 (two) times daily. 270 tablet 1   pantoprazole  (PROTONIX ) 40 MG tablet Take 1 tablet (40 mg total) by mouth at bedtime. 90 tablet 0   senna-docusate (SENOKOT-S) 8.6-50 MG tablet Take 2 tablets by mouth at bedtime as needed for mild constipation. (Patient taking differently: Take 2 tablets by mouth See admin instructions. Take 2 tablets by mouth every other night) 100 tablet 3   tiZANidine  (ZANAFLEX ) 4 MG tablet Take 1 tablet (4 mg total) by mouth every 8 (eight) hours as needed for muscle spasms (spasticity/tightness). 90 tablet 5   traZODone  (DESYREL ) 150 MG tablet Take 0.5 tablets (75 mg total) by mouth at bedtime. 45 tablet 1   valsartan  (DIOVAN ) 80 MG tablet Take 1 tablet (80 mg total) by mouth daily. 90 tablet 3   No current facility-administered medications on file prior to visit.    Allergies  Allergen Reactions   Spironolactone      Social History   Socioeconomic History   Marital status: Married    Spouse name: Trevor Fudge   Number of children: 1   Years of education: Not on file   Highest education level: Some college, no degree  Occupational History   Not on file  Tobacco Use   Smoking status: Never   Smokeless tobacco: Never  Vaping Use   Vaping status: Never Used  Substance and Sexual Activity   Alcohol use: Not Currently   Drug use: Never   Sexual activity: Not Currently  Other Topics Concern   Not on file  Social History Narrative   Lives with wife   Social Drivers of Health   Financial Resource Strain: Low Risk  (03/08/2023)   Overall Financial Resource Strain (CARDIA)    Difficulty of Paying Living Expenses: Not very hard  Food Insecurity: No Food  Insecurity (03/08/2023)   Hunger Vital Sign    Worried About Running Out of Food in the Last Year: Never true    Ran Out of Food in the Last Year: Never true  Transportation Needs: No Transportation Needs (03/08/2023)   PRAPARE - Administrator, Civil Service (Medical): No    Lack of Transportation (Non-Medical): No  Physical Activity: Insufficiently Active (03/08/2023)   Exercise Vital Sign    Days of Exercise per Week: 1 day    Minutes of Exercise per Session: 10 min  Stress: No Stress Concern Present (03/08/2023)   [employer] of Occupational Health - Occupational Stress Questionnaire    Feeling of Stress : Only a little  Social Connections: Socially Isolated (03/08/2023)  Social Advertising account executive [NHANES]    Frequency of Communication with Friends and Family: Once a week    Frequency of Social Gatherings with Friends and Family: Once a week    Attends Religious Services: Never    Database administrator or Organizations: No    Attends Banker Meetings: Never    Marital Status: Married  Catering manager Violence: Not At Risk (03/08/2023)   Humiliation, Afraid, Rape, and Kick questionnaire    Fear of Current or Ex-Partner: No    Emotionally Abused: No    Physically Abused: No    Sexually Abused: No    Family History  Problem Relation Age of Onset   Hypertension Mother    Hypertension Father    Hypertension Sister    Hypertension Brother     No past surgical history on file.  ROS: Review of Systems Negative except as stated above  PHYSICAL EXAM: BP 101/67 (BP Location: Left Arm, Patient Position: Sitting, Cuff Size: Large)   Pulse 68   Temp 98.1 F (36.7 C) (Oral)   Ht 5\' 4"  (1.626 m)   Wt 203 lb (92.1 kg)   SpO2 96%   BMI 34.84 kg/m   Wt Readings from Last 3 Encounters:  07/06/23 203 lb (92.1 kg)  03/08/23 195 lb (88.5 kg)  12/18/22 203 lb 6.4 oz (92.3 kg)    Physical Exam   General appearance - alert, well  appearing, and in no distress Mental status - normal mood, behavior, speech, dress, motor activity, and thought processes Chest - clear to auscultation, no wheezes, rales or rhonchi, symmetric air entry Heart - normal rate, regular rhythm, normal S1, S2, no murmurs, rubs, clicks or gallops Extremities -trace edema above the socks He has his cane with him     07/06/2023    4:55 PM 03/08/2023    3:38 PM 11/05/2022    4:45 PM  Depression screen PHQ 2/9  Decreased Interest 2 2 3   Down, Depressed, Hopeless 0 1 3  PHQ - 2 Score 2 3 6   Altered sleeping 3 3 3   Tired, decreased energy 3 0 3  Change in appetite 1 0 0  Feeling bad or failure about yourself  0 0 3  Trouble concentrating 2 2 0  Moving slowly or fidgety/restless 1 0 0  Suicidal thoughts 0 0 0  PHQ-9 Score 12 8 15   Difficult doing work/chores Extremely dIfficult Somewhat difficult Very difficult       Latest Ref Rng & Units 03/24/2023   11:49 AM 03/08/2023    3:44 PM 01/18/2023    3:10 PM  CMP  Glucose 70 - 99 mg/dL 829  71  80   BUN 6 - 24 mg/dL 15  33  26   Creatinine 0.76 - 1.27 mg/dL 5.62  1.30  8.65   Sodium 134 - 144 mmol/L 143  139  142   Potassium 3.5 - 5.2 mmol/L 4.8  4.4  4.2   Chloride 96 - 106 mmol/L 105  102  107   CO2 20 - 29 mmol/L 19  21  25    Calcium  8.7 - 10.2 mg/dL 9.2  9.2  9.5   Total Protein 6.0 - 8.5 g/dL  8.1    Total Bilirubin 0.0 - 1.2 mg/dL  0.4    Alkaline Phos 44 - 121 IU/L  203    AST 0 - 40 IU/L  30    ALT 0 - 44 IU/L  44  Lipid Panel     Component Value Date/Time   CHOL 107 03/08/2023 1544   TRIG 88 03/08/2023 1544   HDL 38 (L) 03/08/2023 1544   CHOLHDL 2.8 03/08/2023 1544   CHOLHDL 2.1 05/25/2021 0436   VLDL 5 05/25/2021 0436   LDLCALC 52 03/08/2023 1544    CBC    Component Value Date/Time   WBC 7.8 03/08/2023 1544   WBC 8.7 05/24/2021 1609   RBC 4.71 03/08/2023 1544   RBC 4.84 05/24/2021 1609   HGB 14.1 03/08/2023 1544   HCT 41.6 03/08/2023 1544   PLT 224 03/08/2023  1544   MCV 88 03/08/2023 1544   MCH 29.9 03/08/2023 1544   MCH 30.0 05/24/2021 1609   MCHC 33.9 03/08/2023 1544   MCHC 34.0 05/24/2021 1609   RDW 13.4 03/08/2023 1544   LYMPHSABS 1.3 05/24/2021 1609   MONOABS 0.6 05/24/2021 1609   EOSABS 0.3 05/24/2021 1609   BASOSABS 0.1 05/24/2021 1609    ASSESSMENT AND PLAN: 1. Essential hypertension (Primary) At goal. He will continue Eplerenone  50 mg twice a day three days a wk and once a day all the other days, hydralazine  100 mg 3 times a day, metoprolol  75 mg twice a day amlodipine  10 mg daily, Diovan  80 mg daily and clonidine  patch 0.2 mg.  - Basic Metabolic Panel; Future  2. Hyperaldosteronism (HCC) Dose of Eplerenone  was adjusted as 50 mg twice daily had caused his GFR to drop into the 20s. Potassium level on last BMP was normal  3. CKD stage 3b, GFR 30-44 ml/min (HCC) After adjustment of Eplerenone , GFR return to the 30s.  Will continue to monitor.  4. Seizure disorder (HCC) Controlled on Keppra   5. Spasticity as late effect of cerebrovascular accident (CVA) We will will refill tizanidine  when he comes due for the refill.  6. Mild major depression (HCC) Continue Celexa .   Patient was given the opportunity to ask questions.  Patient verbalized understanding of the plan and was able to repeat key elements of the plan.   This documentation was completed using Paediatric nurse.  Any transcriptional errors are unintentional.  Orders Placed This Encounter  Procedures   Basic Metabolic Panel     Requested Prescriptions    No prescriptions requested or ordered in this encounter    Return in about 4 months (around 11/06/2023).  Concetta Dee, MD, FACP

## 2023-07-19 ENCOUNTER — Other Ambulatory Visit: Payer: Self-pay

## 2023-07-19 ENCOUNTER — Ambulatory Visit: Payer: Self-pay | Attending: Internal Medicine

## 2023-07-19 DIAGNOSIS — I10 Essential (primary) hypertension: Secondary | ICD-10-CM

## 2023-07-20 ENCOUNTER — Other Ambulatory Visit: Payer: Self-pay | Admitting: Internal Medicine

## 2023-07-20 ENCOUNTER — Ambulatory Visit: Payer: Self-pay | Admitting: Nurse Practitioner

## 2023-07-21 ENCOUNTER — Other Ambulatory Visit: Payer: Self-pay

## 2023-07-21 LAB — BASIC METABOLIC PANEL WITH GFR
BUN/Creatinine Ratio: 8 — ABNORMAL LOW (ref 9–20)
BUN: 17 mg/dL (ref 6–24)
CO2: 21 mmol/L (ref 20–29)
Calcium: 9.3 mg/dL (ref 8.7–10.2)
Chloride: 103 mmol/L (ref 96–106)
Creatinine, Ser: 2.05 mg/dL — ABNORMAL HIGH (ref 0.76–1.27)
Glucose: 139 mg/dL — ABNORMAL HIGH (ref 70–99)
Potassium: 4.6 mmol/L (ref 3.5–5.2)
Sodium: 141 mmol/L (ref 134–144)
eGFR: 38 mL/min/{1.73_m2} — ABNORMAL LOW (ref >59)

## 2023-07-21 MED ORDER — SENNOSIDES-DOCUSATE SODIUM 8.6-50 MG PO TABS
2.0000 | ORAL_TABLET | Freq: Every evening | ORAL | 3 refills | Status: AC | PRN
  Filled 2023-07-21: qty 60, 30d supply, fill #0

## 2023-07-22 ENCOUNTER — Other Ambulatory Visit: Payer: Self-pay

## 2023-07-29 ENCOUNTER — Other Ambulatory Visit: Payer: Self-pay | Admitting: Diagnostic Neuroimaging

## 2023-07-29 ENCOUNTER — Other Ambulatory Visit: Payer: Self-pay

## 2023-07-29 ENCOUNTER — Other Ambulatory Visit: Payer: Self-pay | Admitting: Internal Medicine

## 2023-07-29 MED ORDER — PANTOPRAZOLE SODIUM 40 MG PO TBEC
40.0000 mg | DELAYED_RELEASE_TABLET | Freq: Every day | ORAL | 0 refills | Status: DC
  Filled 2023-07-29: qty 90, 90d supply, fill #0

## 2023-08-05 ENCOUNTER — Other Ambulatory Visit: Payer: Self-pay

## 2023-08-06 ENCOUNTER — Other Ambulatory Visit: Payer: Self-pay

## 2023-08-09 ENCOUNTER — Encounter: Payer: Self-pay | Admitting: Internal Medicine

## 2023-08-09 ENCOUNTER — Ambulatory Visit (INDEPENDENT_AMBULATORY_CARE_PROVIDER_SITE_OTHER): Payer: Self-pay | Admitting: Internal Medicine

## 2023-08-09 VITALS — BP 136/80 | HR 63 | Ht 64.0 in | Wt 205.0 lb

## 2023-08-09 DIAGNOSIS — E269 Hyperaldosteronism, unspecified: Secondary | ICD-10-CM

## 2023-08-09 NOTE — Patient Instructions (Signed)
 Continue  Epleronone 50 mg, 1 tablet twice daily  Continue Clonidine  0.2 patch weekly Continue Diovan  80 mg daily Continue hydralazine  100 mg, 1 tablet 3 times a day Continue metoprolol  50 mg, 1.5 tablets twice daily Continue amlodipine  10 mg daily

## 2023-08-09 NOTE — Progress Notes (Signed)
 Name: Miguel Mccullough  MRN/ DOB: [id number], Oct 26, 1965    Age/ Sex: 58 y.o., male    PCP: Vicci Barnie NOVAK, MD   Reason for Endocrinology Evaluation: Hyperaldosteronism     Date of Initial Endocrinology Evaluation: 09/18/2022    HPI: Miguel Mccullough is a 58 y.o. male with a past medical history of HTN, CKD, CVA. The patient presented for initial endocrinology clinic visit on 09/18/2022 for consultative assistance with his hyperaldosteronism.   During evaluation for resistant hypertension patient has been noted with elevated Aldo/renin ratio in April 2023At 55.8, renin normal 0.217 NG/mL/HR, and normal aldosterone at 12.1 NG/DL    Has left sided weakness secondary to hemorrhagic stroke in 2023  He was diagnosed wit HTN late 20's   Mother and father with DM    On his initial visit given hemorrhagic stroke and hemiparesis, we have opted to treat him medically as the risk of discontinuing eplerenone  and going through  invasive testing would have have outweighed  the benefit.  24-hour urinary cortisol was normal at 30.7 mcg 09/2022  SUBJECTIVE:    Today (08/09/23):  Miguel Mccullough is here for follow-up on hypertension.   BP at home SBP 112-130 mmhg  Diastolic 80's   Denies headaches or vision changes  Denies chest pain or SOB  Improving occasional left LE swelling  No polydipsia    Diovan  80 mg daily Hydralazine  100 mg 3 times daily Eplerenone  50 mg  BID  Amlodipine  10 mg daily Metoprolol  50 mg, 1.5 tabs  BID Clonidine  0.2 mg patch every Friday      HISTORY:  Past Medical History:  Past Medical History:  Diagnosis Date   COVID-19 2021   continues to have increase in suptum production.   Hypertension    TIA (transient ischemic attack)    Past Surgical History: No past surgical history on file.  Social History:  reports that he has never smoked. He has never used smokeless tobacco. He reports that he does not currently use alcohol. He reports that he does not  use drugs. Family History: family history includes Hypertension in his brother, father, mother, and sister.   HOME MEDICATIONS: Allergies as of 08/09/2023       Reactions   Spironolactone          Medication List        Accurate as of August 09, 2023  7:46 AM. If you have any questions, ask your nurse or doctor.          amLODipine  10 MG tablet Commonly known as: NORVASC  Take 1 tablet (10 mg total) by mouth at bedtime.   aspirin  EC 81 MG tablet Take 81 mg by mouth daily. Swallow whole.   atorvastatin  40 MG tablet Commonly known as: LIPITOR Take 1 tablet (40 mg total) by mouth at bedtime.   b complex vitamins capsule Take 1 capsule by mouth daily.   citalopram  20 MG tablet Commonly known as: CELEXA  Take 1 tablet (20 mg total) by mouth daily.   cloNIDine  0.2 mg/24hr patch Commonly known as: CATAPRES  - Dosed in mg/24 hr Place 1 patch (0.2 mg total) onto the skin once a week.   eplerenone  50 MG tablet Commonly known as: INSPRA  Take 1 tablet (50 mg total) by mouth 2 (two) times daily.   hydrALAZINE  100 MG tablet Commonly known as: APRESOLINE  Take 1 tablet (100 mg total) by mouth every 8 (eight) hours.   levETIRAcetam  500 MG tablet Commonly known as: KEPPRA   Take 1 tablet (500 mg total) by mouth 2 (two) times daily.   magnesium  oxide 400 MG tablet Commonly known as: MAG-OX Take 1 tablet (400 mg total) by mouth daily.   metoprolol  tartrate 50 MG tablet Commonly known as: LOPRESSOR  Take 1 & 1/2 tablets (75 mg total) by mouth 2 (two) times daily.   pantoprazole  40 MG tablet Commonly known as: PROTONIX  Take 1 tablet (40 mg total) by mouth at bedtime.   Senna-Plus 8.6-50 MG tablet Generic drug: senna-docusate Take 2 tablets by mouth at bedtime as needed for mild constipation.   tiZANidine  4 MG tablet Commonly known as: Zanaflex  Take 1 tablet (4 mg total) by mouth every 8 (eight) hours as needed for muscle spasms (spasticity/tightness).   traZODone  150 MG  tablet Commonly known as: DESYREL  Take 0.5 tablets (75 mg total) by mouth at bedtime.   valsartan  80 MG tablet Commonly known as: Diovan  Take 1 tablet (80 mg total) by mouth daily.          REVIEW OF SYSTEMS: A comprehensive ROS was conducted with the patient and is negative except as per HPI    OBJECTIVE:  VS: BP 136/80 (BP Location: Left Arm, Patient Position: Sitting, Cuff Size: Normal)   Pulse 63   Ht 5' 4 (1.626 m)   Wt 205 lb (93 kg)   BMI 35.19 kg/m    Wt Readings from Last 3 Encounters:  08/09/23 205 lb (93 kg)  07/06/23 203 lb (92.1 kg)  03/08/23 195 lb (88.5 kg)     EXAM: General: Pt appears well and is in NAD  Lungs: Clear with good BS bilat   Heart: Auscultation: RRR.  Extremities:  BL LE: 1+ pretibial edema on the left   Mental Status: Judgment, insight: Intact Orientation: Oriented to time, place, and person Mood and affect: No depression, anxiety, or agitation     DATA REVIEWED:    Latest Reference Range & Units 07/19/23 14:00  Sodium 134 - 144 mmol/L 141  Potassium 3.5 - 5.2 mmol/L 4.6  Chloride 96 - 106 mmol/L 103  CO2 20 - 29 mmol/L 21  Glucose 70 - 99 mg/dL 860 (H)  BUN 6 - 24 mg/dL 17  Creatinine 9.23 - 8.72 mg/dL 7.94 (H)  Calcium  8.7 - 10.2 mg/dL 9.3  BUN/Creatinine Ratio 9 - 20  8 (L)  eGFR >59 mL/min/1.73 38 (L)  (H): Data is abnormally high (L): Data is abnormally low   Old records , labs and images have been reviewed.    ASSESSMENT/PLAN/RECOMMENDATIONS:   Resistant hypertension:  -Patient with abnormal Aldo/PRA ratio > 50 - In the past we have opted to avoid invasive testing due to hx of hemorrhagic stroke in 2023. Due to  the risk of elevated BP during this process, we opted to treat the patient medically. Today I entertained the idea of considering adrenal venous sampling as his BP has been stable and it has been 2 years since his CVA -24-hour urinary cortisol normal - We will proceed with adrenal imaging for  MRI -No changes today   Medications :  Continue clonidine  0.2 mg patch weekly Continue eplerenone  50 mg BID  Continue Diovan  80 mg daily Continue hydralazine  100 mg 3 times daily Continue metoprolol  50 mg, 1.5 tablets twice daily Continue amlodipine  10 mg daily      Follow-up in 6 months    Signed electronically by: Stefano Redgie Butts, MD  [facility]  [facility] [address] [place]  [place], [place] [address] Phone: [phone] FAX: [phone]   CC: Vicci Barnie NOVAK, MD [address] [place] [address] Phone: [phone] Fax: [phone]   Return to Endocrinology clinic as below: Future Appointments  Date Time Provider Department Center  11/08/2023  3:50 PM Vicci Barnie NOVAK, MD [id number] None

## 2023-08-13 ENCOUNTER — Other Ambulatory Visit: Payer: Self-pay

## 2023-08-13 MED ORDER — LEVETIRACETAM 500 MG PO TABS
500.0000 mg | ORAL_TABLET | Freq: Two times a day (BID) | ORAL | 0 refills | Status: DC
  Filled 2023-08-13: qty 60, 30d supply, fill #0

## 2023-08-13 NOTE — Telephone Encounter (Signed)
 Last seen on 06/22/22 per note   Return in about 1 year (around 06/22/2023) for MyChart visit (15 min). No follow up scheduled

## 2023-08-16 ENCOUNTER — Other Ambulatory Visit: Payer: Self-pay

## 2023-08-17 ENCOUNTER — Other Ambulatory Visit: Payer: Self-pay

## 2023-08-19 ENCOUNTER — Other Ambulatory Visit: Payer: Self-pay

## 2023-09-05 ENCOUNTER — Other Ambulatory Visit: Payer: Self-pay | Admitting: Diagnostic Neuroimaging

## 2023-09-05 ENCOUNTER — Encounter: Payer: Self-pay | Admitting: Internal Medicine

## 2023-09-06 ENCOUNTER — Other Ambulatory Visit: Payer: Self-pay

## 2023-09-06 ENCOUNTER — Other Ambulatory Visit: Payer: Self-pay | Admitting: Internal Medicine

## 2023-09-06 MED ORDER — LEVETIRACETAM 500 MG PO TABS
500.0000 mg | ORAL_TABLET | Freq: Two times a day (BID) | ORAL | 4 refills | Status: DC
  Filled 2023-09-06: qty 60, 30d supply, fill #0
  Filled 2023-10-08 – 2023-10-14 (×2): qty 60, 30d supply, fill #1
  Filled 2023-11-12 – 2023-11-25 (×2): qty 60, 30d supply, fill #2
  Filled 2023-12-29: qty 60, 30d supply, fill #3
  Filled 2024-01-25: qty 60, 30d supply, fill #4
  Filled ????-??-??: fill #2

## 2023-09-07 ENCOUNTER — Other Ambulatory Visit: Payer: Self-pay

## 2023-09-18 ENCOUNTER — Other Ambulatory Visit: Payer: Self-pay | Admitting: Physical Medicine and Rehabilitation

## 2023-09-18 ENCOUNTER — Other Ambulatory Visit: Payer: Self-pay | Admitting: Internal Medicine

## 2023-09-18 DIAGNOSIS — I6930 Unspecified sequelae of cerebral infarction: Secondary | ICD-10-CM

## 2023-09-20 ENCOUNTER — Other Ambulatory Visit: Payer: Self-pay

## 2023-09-20 ENCOUNTER — Other Ambulatory Visit: Payer: Self-pay | Admitting: Internal Medicine

## 2023-09-20 ENCOUNTER — Other Ambulatory Visit: Payer: Self-pay | Admitting: Family Medicine

## 2023-09-20 DIAGNOSIS — I6930 Unspecified sequelae of cerebral infarction: Secondary | ICD-10-CM

## 2023-09-20 MED ORDER — TIZANIDINE HCL 4 MG PO TABS
4.0000 mg | ORAL_TABLET | Freq: Three times a day (TID) | ORAL | 2 refills | Status: DC | PRN
  Filled 2023-09-20: qty 90, 30d supply, fill #0
  Filled 2023-10-14 – 2023-10-27 (×3): qty 90, 30d supply, fill #1
  Filled 2023-11-26: qty 90, 30d supply, fill #2

## 2023-09-20 MED ORDER — CLONIDINE 0.2 MG/24HR TD PTWK
0.2000 mg | MEDICATED_PATCH | TRANSDERMAL | 12 refills | Status: DC
  Filled 2023-09-20: qty 4, 28d supply, fill #0
  Filled 2023-10-14 – 2023-10-29 (×4): qty 4, 28d supply, fill #1
  Filled 2023-11-23: qty 4, 28d supply, fill #2
  Filled 2023-12-29: qty 4, 28d supply, fill #3
  Filled 2024-01-18 – 2024-01-25 (×2): qty 4, 28d supply, fill #4

## 2023-09-20 MED ORDER — TRAZODONE HCL 150 MG PO TABS
75.0000 mg | ORAL_TABLET | Freq: Every day | ORAL | 1 refills | Status: AC
Start: 2023-09-20 — End: ?
  Filled 2023-09-20: qty 45, 90d supply, fill #0
  Filled 2023-12-29: qty 45, 90d supply, fill #1

## 2023-09-24 ENCOUNTER — Other Ambulatory Visit: Payer: Self-pay

## 2023-10-08 ENCOUNTER — Other Ambulatory Visit: Payer: Self-pay | Admitting: Internal Medicine

## 2023-10-08 DIAGNOSIS — I10 Essential (primary) hypertension: Secondary | ICD-10-CM

## 2023-10-09 ENCOUNTER — Other Ambulatory Visit (HOSPITAL_COMMUNITY): Payer: Self-pay

## 2023-10-11 ENCOUNTER — Other Ambulatory Visit: Payer: Self-pay

## 2023-10-11 ENCOUNTER — Other Ambulatory Visit (HOSPITAL_COMMUNITY): Payer: Self-pay

## 2023-10-11 MED ORDER — AMLODIPINE BESYLATE 10 MG PO TABS
10.0000 mg | ORAL_TABLET | Freq: Every day | ORAL | 1 refills | Status: AC
  Filled 2023-10-11 – 2023-10-14 (×2): qty 90, 90d supply, fill #0
  Filled 2023-12-29: qty 90, 90d supply, fill #1

## 2023-10-11 MED ORDER — HYDRALAZINE HCL 100 MG PO TABS
100.0000 mg | ORAL_TABLET | Freq: Three times a day (TID) | ORAL | 1 refills | Status: AC
Start: 2023-10-11 — End: ?
  Filled 2023-10-11 – 2023-10-14 (×2): qty 270, 90d supply, fill #0
  Filled 2024-01-10: qty 270, 90d supply, fill #1

## 2023-10-13 ENCOUNTER — Other Ambulatory Visit: Payer: Self-pay

## 2023-10-14 ENCOUNTER — Other Ambulatory Visit: Payer: Self-pay

## 2023-10-15 ENCOUNTER — Other Ambulatory Visit: Payer: Self-pay

## 2023-10-20 ENCOUNTER — Other Ambulatory Visit: Payer: Self-pay | Admitting: Internal Medicine

## 2023-10-20 ENCOUNTER — Other Ambulatory Visit: Payer: Self-pay

## 2023-10-20 ENCOUNTER — Encounter: Payer: Self-pay | Admitting: Internal Medicine

## 2023-10-20 MED ORDER — PANTOPRAZOLE SODIUM 40 MG PO TBEC
40.0000 mg | DELAYED_RELEASE_TABLET | Freq: Every day | ORAL | 0 refills | Status: DC
  Filled 2023-10-20 – 2023-10-29 (×2): qty 90, 90d supply, fill #0

## 2023-10-20 MED ORDER — VALSARTAN 80 MG PO TABS
80.0000 mg | ORAL_TABLET | Freq: Every day | ORAL | 1 refills | Status: AC
  Filled 2023-10-27: qty 90, 90d supply, fill #0
  Filled 2024-02-21: qty 90, 90d supply, fill #1

## 2023-10-26 ENCOUNTER — Other Ambulatory Visit: Payer: Self-pay

## 2023-10-27 ENCOUNTER — Other Ambulatory Visit: Payer: Self-pay

## 2023-10-29 ENCOUNTER — Other Ambulatory Visit: Payer: Self-pay

## 2023-11-05 ENCOUNTER — Telehealth: Payer: Self-pay | Admitting: Internal Medicine

## 2023-11-05 NOTE — Telephone Encounter (Signed)
 Lvm to confirm appt

## 2023-11-08 ENCOUNTER — Other Ambulatory Visit: Payer: Self-pay

## 2023-11-08 ENCOUNTER — Ambulatory Visit: Payer: Self-pay | Attending: Internal Medicine | Admitting: Internal Medicine

## 2023-11-08 VITALS — BP 111/73 | HR 67 | Temp 97.8°F | Ht 64.0 in | Wt 199.0 lb

## 2023-11-08 DIAGNOSIS — I129 Hypertensive chronic kidney disease with stage 1 through stage 4 chronic kidney disease, or unspecified chronic kidney disease: Secondary | ICD-10-CM | POA: Insufficient documentation

## 2023-11-08 DIAGNOSIS — Z5320 Procedure and treatment not carried out because of patient's decision for unspecified reasons: Secondary | ICD-10-CM

## 2023-11-08 DIAGNOSIS — Z79899 Other long term (current) drug therapy: Secondary | ICD-10-CM | POA: Insufficient documentation

## 2023-11-08 DIAGNOSIS — Z2821 Immunization not carried out because of patient refusal: Secondary | ICD-10-CM

## 2023-11-08 DIAGNOSIS — I10 Essential (primary) hypertension: Secondary | ICD-10-CM | POA: Diagnosis present

## 2023-11-08 DIAGNOSIS — E269 Hyperaldosteronism, unspecified: Secondary | ICD-10-CM

## 2023-11-08 DIAGNOSIS — I69354 Hemiplegia and hemiparesis following cerebral infarction affecting left non-dominant side: Secondary | ICD-10-CM | POA: Diagnosis not present

## 2023-11-08 DIAGNOSIS — G40909 Epilepsy, unspecified, not intractable, without status epilepticus: Secondary | ICD-10-CM

## 2023-11-08 DIAGNOSIS — Z5986 Financial insecurity: Secondary | ICD-10-CM | POA: Diagnosis not present

## 2023-11-08 DIAGNOSIS — N1832 Chronic kidney disease, stage 3b: Secondary | ICD-10-CM | POA: Diagnosis not present

## 2023-11-08 DIAGNOSIS — I6930 Unspecified sequelae of cerebral infarction: Secondary | ICD-10-CM

## 2023-11-08 MED ORDER — ATORVASTATIN CALCIUM 40 MG PO TABS
40.0000 mg | ORAL_TABLET | Freq: Every day | ORAL | 1 refills | Status: AC
  Filled 2023-11-08 – 2023-11-23 (×2): qty 90, 90d supply, fill #0
  Filled 2024-02-21: qty 90, 90d supply, fill #1

## 2023-11-08 NOTE — Progress Notes (Signed)
 Patient ID: Remiel GALLERY, male    DOB: 1965/03/08  MRN: [id number]  CC: Hypertension (HTN f/u. Med refills. /No questions / concerns/No to all vax. No to colonoscopy. )   Subjective: Gevon Markus is a 58 y.o. male who presents for chronic ds management. Wife is with him. 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 Margaret 06/18/2021.  Started on Keppra ).    Discussed the use of AI scribe software for clinical note transcription with the patient, who gave verbal consent to proceed.  History of Present Illness JOCK MAHON is a 58 year old male with hypertension, seizure disorder, and hyperaldosteronism who presents for follow-up of his chronic medical conditions. He is accompanied by his wife.  HTN/Hyperaldo/CKD: he continues on eplerenone  50 mg twice a day three days a week and once a day on other days, hydralazine  100 mg three times a day, metoprolol  75 mg twice a day, amlodipine  10 mg daily, Diovan  80 mg daily, and a clonidine  patch. -He has not checked his blood pressure since July due to home renovations, but it is usually around 117/75. No dizziness, chest pain, shortness of breath, or leg swelling. - Saw his endocrinologist Dr. Kellie in June.  MRI of abdomen was ordered to look at the adrenal glands but he was not called.  He did not have insurance coverage at the time but has since obtained Medicare part A & B. -Last 2 GFRs remain in upper 30s  He has a history of seizure disorder with no seizures since his last visit. He continues to take Keppra  500 mg BID.  He has a history of depression and is on citalopram  20 mg, which he is tolerating well. Initially, he did not recall this medication, but his wife confirmed he is taking it.  Hx CVA: He has a history of stroke and continues to take aspirin  and atorvastatin . No falls, and he uses a cane primarily when  leaving the house.  HM: declines flu shot and PSA (not covered by Medicare - cost about $65 and would have to sign a waiver.    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 90 tablet 1   aspirin  EC 81 MG tablet Take 81 mg by mouth daily. Swallow whole.     b complex vitamins capsule Take 1 capsule by mouth daily.     citalopram  (CELEXA ) 20 MG tablet Take 1 tablet (20 mg total) by mouth daily. 90 tablet 1   cloNIDine  (CATAPRES  - DOSED IN MG/24 HR) 0.2 mg/24hr patch Place  1 patch (0.2 mg total) onto the skin once a week. 4 patch 12   eplerenone  (INSPRA ) 50 MG tablet Take 1 tablet (50 mg total) by mouth 2 (two) times daily. 180 tablet 3   hydrALAZINE  (APRESOLINE ) 100 MG tablet Take 1 tablet (100 mg total) by mouth every 8 (eight) hours. 270 tablet 1   levETIRAcetam  (KEPPRA ) 500 MG tablet Take 1 tablet (500 mg total) by mouth 2 (two) times daily. 60 tablet 4   magnesium  oxide (MAG-OX) 400 MG tablet Take 1 tablet (400 mg total) by mouth daily. 30 tablet 0   metoprolol  tartrate (LOPRESSOR ) 50 MG tablet Take 1 & 1/2 tablets (75 mg total) by mouth 2 (two) times daily. 270  tablet 1   pantoprazole  (PROTONIX ) 40 MG tablet Take 1 tablet (40 mg total) by mouth at bedtime. 90 tablet 0   senna-docusate (STIMULANT LAXATIVE) 8.6-50 MG tablet Take 2 tablets by mouth at bedtime as needed for mild constipation. 100 tablet 3   tiZANidine  (ZANAFLEX ) 4 MG tablet Take 1 tablet (4 mg total) by mouth every 8 (eight) hours as needed for muscle spasms (spasticity/tightness). 90 tablet 2   traZODone  (DESYREL ) 150 MG tablet Take 0.5 tablets (75 mg total) by mouth at bedtime. 45 tablet 1   valsartan  (DIOVAN ) 80 MG tablet Take 1 tablet (80 mg total) by mouth daily. 90 tablet 1   No current facility-administered medications on file prior to visit.    Allergies  Allergen Reactions   Spironolactone      Social History   Socioeconomic History   Marital status: Married    Spouse name: Corean   Number of children: 1   Years of education: Not on file   Highest education level: Some college, no degree  Occupational History   Not on file  Tobacco Use   Smoking status: Never   Smokeless tobacco: Never  Vaping Use   Vaping status: Never Used  Substance and Sexual Activity   Alcohol use: Not Currently   Drug use: Never   Sexual activity: Not Currently  Other Topics Concern   Not on file  Social History Narrative   Lives with wife   Social Drivers of Health   Financial Resource Strain: Medium Risk (11/08/2023)   Overall Financial Resource Strain (CARDIA)    Difficulty of Paying Living Expenses: Somewhat hard  Food Insecurity: No Food Insecurity (11/08/2023)   Hunger Vital Sign    Worried About Running Out of Food in the Last Year: Never true    Ran Out of Food in the Last Year: Never true  Transportation Needs: No Transportation Needs (11/08/2023)   PRAPARE - Administrator, Civil Service (Medical): No    Lack of Transportation (Non-Medical): No  Physical Activity: Inactive (11/08/2023)   Exercise Vital Sign    Days of Exercise per Week: 0 days    Minutes  of Exercise per Session: Not on file  Stress: No Stress Concern Present (11/08/2023)   [employer] of Occupational Health - Occupational Stress Questionnaire    Feeling of Stress: Only a little  Social Connections: Moderately Isolated (11/08/2023)   Social Connection and Isolation Panel    Frequency of Communication with Friends and Family: Once a week    Frequency of Social Gatherings with Friends and Family: Once a week    Attends Religious Services: 1 to 4 times per year    Active Member of [employer] or Organizations: No    Attends Banker Meetings: Not  on file    Marital Status: Married  Catering manager Violence: Not At Risk (03/08/2023)   Humiliation, Afraid, Rape, and Kick questionnaire    Fear of Current or Ex-Partner: No    Emotionally Abused: No    Physically Abused: No    Sexually Abused: No    Family History  Problem Relation Age of Onset   Hypertension Mother    Hypertension Father    Hypertension Sister    Hypertension Brother     No past surgical history on file.  ROS: Review of Systems Negative except as stated above  PHYSICAL EXAM: BP 111/73   Pulse 67   Temp 97.8 F (36.6 C) (Oral)   Ht 5' 4 (1.626 m)   Wt 199 lb (90.3 kg)   SpO2 97%   BMI 34.16 kg/m   Physical Exam   General appearance - alert, well appearing, older AAM and in no distress Mental status - normal mood, behavior, speech, dress, motor activity, and thought processes Neck - supple, no significant adenopathy Chest - clear to auscultation, no wheezes, rales or rhonchi, symmetric air entry Heart - normal rate, regular rhythm, normal S1, S2, no murmurs, rubs, clicks or gallops Neurological - Grip 5/5 RT, 4/5 LT with some spasms of fingers. Power LUE 4+/5 prox/distal; RUE 5/5 prox/dist. Power in [place] 5/5. Pt has a cane Extremities - no LE edema     Latest Ref Rng & Units 07/19/2023    2:00 PM 03/24/2023   11:49 AM 03/08/2023    3:44 PM  CMP  Glucose 70 - 99 mg/dL 860  890   71   BUN 6 - 24 mg/dL 17  15  33   Creatinine 0.76 - 1.27 mg/dL 7.94  7.96  7.23   Sodium 134 - 144 mmol/L 141  143  139   Potassium 3.5 - 5.2 mmol/L 4.6  4.8  4.4   Chloride 96 - 106 mmol/L 103  105  102   CO2 20 - 29 mmol/L 21  19  21    Calcium  8.7 - 10.2 mg/dL 9.3  9.2  9.2   Total Protein 6.0 - 8.5 g/dL   8.1   Total Bilirubin 0.0 - 1.2 mg/dL   0.4   Alkaline Phos 44 - 121 IU/L   203   AST 0 - 40 IU/L   30   ALT 0 - 44 IU/L   44    Lipid Panel     Component Value Date/Time   CHOL 107 03/08/2023 1544   TRIG 88 03/08/2023 1544   HDL 38 (L) 03/08/2023 1544   CHOLHDL 2.8 03/08/2023 1544   CHOLHDL 2.1 05/25/2021 0436   VLDL 5 05/25/2021 0436   LDLCALC 52 03/08/2023 1544    CBC    Component Value Date/Time   WBC 7.8 03/08/2023 1544   WBC 8.7 05/24/2021 1609   RBC 4.71 03/08/2023 1544   RBC 4.84 05/24/2021 1609   HGB 14.1 03/08/2023 1544   HCT 41.6 03/08/2023 1544   PLT 224 03/08/2023 1544   MCV 88 03/08/2023 1544   MCH 29.9 03/08/2023 1544   MCH 30.0 05/24/2021 1609   MCHC 33.9 03/08/2023 1544   MCHC 34.0 05/24/2021 1609   RDW 13.4 03/08/2023 1544   LYMPHSABS 1.3 05/24/2021 1609   MONOABS 0.6 05/24/2021 1609   EOSABS 0.3 05/24/2021 1609   BASOSABS 0.1 05/24/2021 1609    ASSESSMENT AND PLAN: 1. Essential hypertension (Primary) - blood pressure reading today was low initially  but repeat reading is within normal range.  I would like at some point to be able to decreased the Diovan  to 40 mg daily; will recheck BMP first - continue on eplerenone  50 mg twice a day three days a week and once a day on other days, hydralazine  100 mg three times a day, metoprolol  75 mg twice a day, amlodipine  10 mg daily, Diovan  80 mg daily, and a clonidine  patch.  2. Hyperaldosteronism See #1 above Pt to endocrinologist about moving forward with scheduling MRI abdomin now that he has Medicare. From the order, I can not tell if it was sent to [facility] or [facility]  3. CKD stage 3b, GFR 30-44  ml/min (HCC) That he has Medicare we will get him in with nephrology. - Ambulatory referral to Nephrology - Basic Metabolic Panel  4. Seizure disorder (HCC) Continue Keppra .  He has been seizure-free for at least over a year.  5. History of hemorrhagic cerebrovascular accident (CVA) with residual deficit - atorvastatin  (LIPITOR) 40 MG tablet; Take 1 tablet (40 mg total) by mouth at bedtime.  Dispense: 90 tablet; Refill: 1  6. Prostate cancer screening declined Patient declines screening with PSA due to cost.  Not covered by Medicare part B.  7. Influenza vaccination declined     Patient was given the opportunity to ask questions.  Patient verbalized understanding of the plan and was able to repeat key elements of the plan.   This documentation was completed using Paediatric nurse.  Any transcriptional errors are unintentional.  Orders Placed This Encounter  Procedures   Basic Metabolic Panel   Ambulatory referral to Nephrology     Requested Prescriptions   Signed Prescriptions Disp Refills   atorvastatin  (LIPITOR) 40 MG tablet 90 tablet 1    Sig: Take 1 tablet (40 mg total) by mouth at bedtime.    Return in about 7 weeks (around 12/27/2023) for Welcome to Medicare Visit.  Barnie Louder, MD, FACP

## 2023-11-08 NOTE — Patient Instructions (Signed)
 VISIT SUMMARY:  Today, you came in for a follow-up on your chronic medical conditions, including hypertension, hyperaldosteronism, seizure disorder, and depression. You were accompanied by your wife. We discussed your current medications, recent health status, and necessary follow-up actions.  YOUR PLAN:  -HYPERTENSION WITH HYPERALDOSTERONISM: Hypertension is high blood pressure, and hyperaldosteronism is a condition where your adrenal glands produce too much of the hormone aldosterone, which can increase blood pressure. Your blood pressure was low today at 95/61 mmHg. We will recheck it today, and if it remains low, we will decrease your Diovan  dosage. We will also contact Dr. Ricardo office to resubmit the adrenal MRI order now that you have Medicare coverage.  -CHRONIC KIDNEY DISEASE: Chronic kidney disease is a condition where your kidneys gradually lose function. Your kidney function is stable. We will refer you to [facility] for a nephrology consultation and order lab tests to monitor your kidney function.  -SEQUELAE OF CEREBRAL INFARCTION: Sequelae of cerebral infarction refers to the after-effects of a stroke. You have not had any falls and use a cane when outside. You have some left grip weakness. Continue taking aspirin  and atorvastatin  as prescribed.  -SEIZURE DISORDER: A seizure disorder is a condition where you have episodes of abnormal electrical activity in the brain. You have not had any seizures since your last visit. Continue taking Keppra  as prescribed.  -MAJOR DEPRESSIVE DISORDER: Major depressive disorder is a mental health condition characterized by persistent feelings of sadness and loss of interest. You are taking citalopram  20 mg daily without any issues. Continue with this medication.  -GENERAL HEALTH MAINTENANCE: You declined the flu vaccination today. We will schedule a Medicare wellness visit in 4-8 weeks and order a PSA screening if it is covered by  Medicare.  INSTRUCTIONS:  Please recheck your blood pressure today. If it remains low, we will adjust your Diovan  dosage. We will also contact Dr. Ricardo office to resubmit the adrenal MRI order. Additionally, we will refer you to [facility] for a nephrology consultation and order lab tests for your kidney function. Please schedule a Medicare wellness visit in 4-8 weeks.

## 2023-11-11 ENCOUNTER — Other Ambulatory Visit

## 2023-11-12 ENCOUNTER — Ambulatory Visit: Attending: Internal Medicine

## 2023-11-12 ENCOUNTER — Other Ambulatory Visit: Payer: Self-pay | Admitting: Internal Medicine

## 2023-11-12 DIAGNOSIS — F320 Major depressive disorder, single episode, mild: Secondary | ICD-10-CM

## 2023-11-13 LAB — BASIC METABOLIC PANEL WITH GFR
BUN/Creatinine Ratio: 9 (ref 9–20)
BUN: 18 mg/dL (ref 6–24)
CO2: 22 mmol/L (ref 20–29)
Calcium: 9.1 mg/dL (ref 8.7–10.2)
Chloride: 105 mmol/L (ref 96–106)
Creatinine, Ser: 1.98 mg/dL — ABNORMAL HIGH (ref 0.76–1.27)
Glucose: 143 mg/dL — ABNORMAL HIGH (ref 70–99)
Potassium: 4.3 mmol/L (ref 3.5–5.2)
Sodium: 141 mmol/L (ref 134–144)
eGFR: 39 mL/min/1.73 — ABNORMAL LOW (ref >59)

## 2023-11-14 ENCOUNTER — Ambulatory Visit: Payer: Self-pay | Admitting: Internal Medicine

## 2023-11-15 ENCOUNTER — Other Ambulatory Visit: Payer: Self-pay

## 2023-11-15 MED ORDER — CITALOPRAM HYDROBROMIDE 20 MG PO TABS
20.0000 mg | ORAL_TABLET | Freq: Every day | ORAL | 1 refills | Status: AC
  Filled 2023-11-15 – 2023-11-25 (×2): qty 90, 90d supply, fill #0
  Filled 2024-02-21: qty 90, 90d supply, fill #1

## 2023-11-16 ENCOUNTER — Other Ambulatory Visit: Payer: Self-pay

## 2023-11-17 ENCOUNTER — Other Ambulatory Visit: Payer: Self-pay

## 2023-11-23 ENCOUNTER — Other Ambulatory Visit: Payer: Self-pay

## 2023-11-24 ENCOUNTER — Other Ambulatory Visit: Payer: Self-pay

## 2023-11-25 ENCOUNTER — Other Ambulatory Visit: Payer: Self-pay

## 2023-11-30 ENCOUNTER — Other Ambulatory Visit: Payer: Self-pay

## 2023-12-16 LAB — LAB REPORT - SCANNED
Albumin, Urine POC: 9.4
Creatinine, POC: 181.4 mg/dL
EGFR: 35
Microalb Creat Ratio: 5

## 2023-12-27 ENCOUNTER — Encounter: Payer: Self-pay | Admitting: Internal Medicine

## 2023-12-29 ENCOUNTER — Other Ambulatory Visit: Payer: Self-pay

## 2023-12-29 ENCOUNTER — Other Ambulatory Visit: Payer: Self-pay | Admitting: Family Medicine

## 2023-12-29 ENCOUNTER — Other Ambulatory Visit: Payer: Self-pay | Admitting: Internal Medicine

## 2023-12-29 DIAGNOSIS — I10 Essential (primary) hypertension: Secondary | ICD-10-CM

## 2023-12-29 MED ORDER — EPLERENONE 50 MG PO TABS
50.0000 mg | ORAL_TABLET | Freq: Two times a day (BID) | ORAL | 3 refills | Status: AC
  Filled 2023-12-29: qty 60, 30d supply, fill #0

## 2023-12-29 MED ORDER — METOPROLOL TARTRATE 50 MG PO TABS
75.0000 mg | ORAL_TABLET | Freq: Two times a day (BID) | ORAL | 1 refills | Status: AC
  Filled 2023-12-29: qty 270, 90d supply, fill #0

## 2023-12-29 MED ORDER — TIZANIDINE HCL 4 MG PO TABS
4.0000 mg | ORAL_TABLET | Freq: Three times a day (TID) | ORAL | 0 refills | Status: DC | PRN
  Filled 2023-12-29: qty 90, 30d supply, fill #0

## 2024-01-19 ENCOUNTER — Other Ambulatory Visit: Payer: Self-pay

## 2024-01-25 ENCOUNTER — Other Ambulatory Visit: Payer: Self-pay | Admitting: Family Medicine

## 2024-01-25 ENCOUNTER — Other Ambulatory Visit: Payer: Self-pay | Admitting: Internal Medicine

## 2024-01-26 ENCOUNTER — Other Ambulatory Visit: Payer: Self-pay

## 2024-01-26 MED ORDER — TIZANIDINE HCL 4 MG PO TABS
4.0000 mg | ORAL_TABLET | Freq: Three times a day (TID) | ORAL | 0 refills | Status: DC | PRN
  Filled 2024-01-26 (×2): qty 90, 30d supply, fill #0

## 2024-01-26 MED ORDER — PANTOPRAZOLE SODIUM 40 MG PO TBEC
40.0000 mg | DELAYED_RELEASE_TABLET | Freq: Every day | ORAL | 0 refills | Status: AC
  Filled 2024-01-26: qty 90, 90d supply, fill #0

## 2024-02-01 ENCOUNTER — Ambulatory Visit: Attending: Internal Medicine | Admitting: Internal Medicine

## 2024-02-01 ENCOUNTER — Other Ambulatory Visit: Payer: Self-pay

## 2024-02-01 VITALS — BP 99/59 | HR 70 | Temp 97.6°F | Ht 63.0 in | Wt 203.0 lb

## 2024-02-01 DIAGNOSIS — I10 Essential (primary) hypertension: Secondary | ICD-10-CM | POA: Diagnosis not present

## 2024-02-01 DIAGNOSIS — Z2821 Immunization not carried out because of patient refusal: Secondary | ICD-10-CM

## 2024-02-01 DIAGNOSIS — Z5320 Procedure and treatment not carried out because of patient's decision for unspecified reasons: Secondary | ICD-10-CM

## 2024-02-01 DIAGNOSIS — Z7189 Other specified counseling: Secondary | ICD-10-CM | POA: Diagnosis not present

## 2024-02-01 DIAGNOSIS — E66812 Obesity, class 2: Secondary | ICD-10-CM

## 2024-02-01 DIAGNOSIS — Z0000 Encounter for general adult medical examination without abnormal findings: Secondary | ICD-10-CM

## 2024-02-01 DIAGNOSIS — Z1211 Encounter for screening for malignant neoplasm of colon: Secondary | ICD-10-CM

## 2024-02-01 DIAGNOSIS — Z79899 Other long term (current) drug therapy: Secondary | ICD-10-CM | POA: Diagnosis not present

## 2024-02-01 DIAGNOSIS — Z6835 Body mass index (BMI) 35.0-35.9, adult: Secondary | ICD-10-CM | POA: Diagnosis not present

## 2024-02-01 MED ORDER — CLONIDINE 0.1 MG/24HR TD PTWK
0.1000 mg | MEDICATED_PATCH | TRANSDERMAL | 3 refills | Status: AC
  Filled 2024-02-01 (×2): qty 4, 28d supply, fill #0

## 2024-02-01 NOTE — Patient Instructions (Addendum)
 If you do a health care power of attorney or living well, please bring a copy for our records.  Please check your blood pressure at least 2 times a week with goal being 130/80 or lower.   Decrease Clonidine  patch to 0.1 mg once a week.  After being on this dose for 2 weeks, if blood pressure is staying 130/80 or lower, you can stop the patch. Please let me know if blood pressure jumps up above this goal.  Mediterranean Diet A Mediterranean diet is based on the traditions of countries on the [employer]. It focuses on eating more: Fruits and vegetables. Whole grains, beans, nuts, and seeds. Heart-healthy fats. These are fats that are good for your heart. It involves eating less: Dairy. Meat and eggs. Processed foods with added sugar, salt, and fat. This type of diet can help prevent certain conditions. It can also improve outcomes if you have a long-term (chronic) disease, such as kidney or heart disease. What are tips for following this plan? Reading food labels Check packaged foods for: The serving size. For foods such as rice and pasta, the serving size is the amount of cooked product, not dry. The total fat. Avoid foods with saturated fat or trans fat. Added sugars, such as corn syrup. Shopping  Try to have a balanced diet. Buy a variety of foods, such as: Fresh fruits and vegetables. You may be able to get these from local farmers markets. You can also buy them frozen. Grains, beans, nuts, and seeds. Some of these can be bought in bulk. Fresh seafood. Poultry and eggs. Low-fat dairy products. Buy whole ingredients instead of foods that have already been packaged. If you can't get fresh seafood, buy precooked frozen shrimp or canned fish, such as tuna, salmon, or sardines. Stock your pantry so you always have certain foods on hand, such as olive oil, canned tuna, canned tomatoes, rice, pasta, and beans. Cooking Cook foods with extra-virgin olive oil instead of using butter  or other vegetable oils. Have meat as a side dish. Have vegetables or grains as your main dish. This means having meat in small portions or adding small amounts of meat to foods like pasta or stew. Use beans or vegetables instead of meat in common dishes like chili or lasagna. Try out different cooking methods. Try roasting, broiling, steaming, and sauting vegetables. Add frozen vegetables to soups, stews, pasta, or rice. Add nuts or seeds for added healthy fats and plant protein at each meal. You can add these to yogurt, salads, or vegetable dishes. Marinate fish or vegetables using olive oil, lemon juice, garlic, and fresh herbs. Meal planning Plan to eat a vegetarian meal one day each week. Try to work up to two vegetarian meals, if possible. Eat seafood two or more times a week. Have healthy snacks on hand. These may include: Vegetable sticks with hummus. Greek yogurt. Fruit and nut trail mix. Eat balanced meals. These should include: Fruit: 2-3 servings a day. Vegetables: 4-5 servings a day. Low-fat dairy: 2 servings a day. Fish, poultry, or lean meat: 1 serving a day. Beans and legumes: 2 or more servings a week. Nuts and seeds: 1-2 servings a day. Whole grains: 6-8 servings a day. Extra-virgin olive oil: 3-4 servings a day. Limit red meat and sweets to just a few servings a month. Lifestyle  Try to cook and eat meals with your family. Drink enough fluid to keep your pee (urine) pale yellow. Be active every day. This includes: Aerobic exercise,  which is exercise that causes your heart to beat faster. Examples include running and swimming. Leisure activities like gardening, walking, or housework. Get 7-8 hours of sleep each night. Drink red wine if your provider says you can. A glass of wine is 5 oz (150 mL). You may be allowed to have: Up to 1 glass a day if you're male and not pregnant. Up to 2 glasses a day if you're male. What foods should I eat? Fruits Apples.  Apricots. Avocado. Berries. Bananas. Cherries. Dates. Figs. Grapes. Lemons. Melon. Oranges. Peaches. Plums. Pomegranate. Vegetables Artichokes. Beets. Broccoli. Cabbage. Carrots. Eggplant. Green beans. Chard. Kale. Spinach. Onions. Leeks. Peas. Squash. Tomatoes. Peppers. Radishes. Grains Whole-grain pasta. Brown rice. Bulgur wheat. Polenta. Couscous. Whole-wheat bread. [place] Madeira. Meats and other proteins Beans. Almonds. Sunflower seeds. Pine nuts. Peanuts. Cod. Salmon. Scallops. Shrimp. Tuna. Tilapia. Clams. Oysters. Eggs. Chicken or turkey without skin. Dairy Low-fat milk. Cheese. Greek yogurt. Fats and oils Extra-virgin olive oil. Avocado oil. Grapeseed oil. Beverages Water. Red wine. Herbal tea. Sweets and desserts Greek yogurt with honey. Baked apples. Poached pears. Trail mix. Seasonings and condiments Basil. Cilantro. Coriander. Cumin. Mint. Parsley. Sage. Rosemary. Tarragon. Garlic. Oregano. Thyme. Pepper. Balsamic vinegar. Tahini. Hummus. Tomato sauce. Olives. Mushrooms. The items listed above may not be all the foods and drinks you can have. Talk to a dietitian to learn more. What foods should I limit? This is a list of foods that should be eaten rarely. Fruits Fruit canned in syrup. Vegetables Deep-fried potatoes, like French fries. Grains Packaged pasta or rice dishes. Cereal with added sugar. Snacks with added sugar. Meats and other proteins Beef. Pork. Lamb. Chicken or turkey with skin. Hot dogs. [place]. Dairy Ice cream. Sour cream. Whole milk. Fats and oils Butter. Canola oil. Vegetable oil. Beef fat (tallow). Lard. Beverages Juice. Sugar-sweetened soft drinks. Beer. Liquor and spirits. Sweets and desserts Cookies. Cakes. Pies. Candy. Seasonings and condiments Mayonnaise. Pre-made sauces and marinades. The items listed above may not be all the foods and drinks you should limit. Talk to a dietitian to learn more. Where to find more information American Heart  Association (AHA): heart.org This information is not intended to replace advice given to you by your health care provider. Make sure you discuss any questions you have with your health care provider. Document Revised: 05/17/2022 Document Reviewed: 05/17/2022 Elsevier Patient Education  2024 [employer].

## 2024-02-01 NOTE — Progress Notes (Signed)
 Chief Complaint  Patient presents with   Welcome to [facility]    Welcome to [facility]     Subjective:   Miguel Mccullough is a 58 y.o. male who presents for a Welcome to Medicare Exam.  Wife is with him.  HTN: BP noted to be low this a.m.  Took meds already for the a.m.  No dizziness just tired.  No CP/SOB Has not checked BP regularly since last visit Saw nephrologist Dr. Dennise last mth and BP was good.  He recommended that we try to wean patient off 1 or 2 of his blood pressure medication starting with clonidine  or hydralazine .  Visit info / Clinical Intake: Medicare Wellness Visit Type:: Welcome to [employer] (IPPE) Persons participating in visit and providing information:: patient & caregiver (Patient and wife are present) Medicare Wellness Visit Mode:: In-person (required for WTM) Interpreter Needed?: No Pre-visit prep was completed: yes AWV questionnaire completed by patient prior to visit?: no Living arrangements:: lives with spouse/significant other Patient's Overall Health Status Rating: (!) fair Typical amount of pain: none Does pain affect daily life?: no  Dietary Habits and Nutritional Risks How many meals a day?: 3 Eats fruit and vegetables daily?: yes Most meals are obtained by: having others provide food In the last 2 weeks, have you had any of the following?: none Diabetic:: no  Functional Status Activities of Daily Living (to include ambulation/medication): (!) Needs Assist Feeding: Independent Dressing/Grooming: Independent Bathing: Independent Toileting: Independent Transfer: Independent Ambulation: Independent (Cane) Medication Administration: Needs assistance (comment) (Wife assists with medication administration and refills) Home Management (perform basic housework or laundry): Needs assistance (comment) Manage your own finances?: (!) no (Wife manages finances) Primary transportation is: family / friends Concerns about vision?: no *vision screening is required for  WTM* Concerns about hearing?: no  Fall Screening Falls in the past year?: 1, this past summer. Tripped on rug. All rugs have since been removed. Ambulates with cane Number of falls in past year: 0 Was there an injury with Fall?: 0 Fall Risk Category Calculator: 1 Patient Fall Risk Level: Low Fall Risk  Fall Risk Patient at Risk for Falls Due to: No Fall Risks Fall risk Follow up: Falls evaluation completed  Home and Transportation Safety: All rugs have non-skid backing?: N/A, no rugs All stairs or steps have railings?: yes Grab bars in the bathtub or shower?: yes Have non-skid surface in bathtub or shower?: yes Good home lighting?: yes Regular seat belt use?: yes Hospital stays in the last year:: (Patient-Rptd) no  Cognitive Assessment Difficulty concentrating, remembering, or making decisions? : yes Will 6CIT or Mini Cog be Completed: yes (Mini mental exam will be completed)  Advance Directives (For Healthcare) Does Patient Have a Medical Advance Directive?: No Would patient like information on creating a medical advance directive?: yes - Patient given package.   Reviewed/Updated  Reviewed/Updated: Medications; Allergies; Family History; Surgical History; Medical History; Reviewed All (Medical, Surgical, Family, Medications, Allergies, Care Teams, Patient Goals); Care Teams; Patient Goals    Allergies (verified) Spironolactone    Current Medications (verified) Outpatient Encounter Medications as of 02/01/2024  Medication Sig   amLODipine  (NORVASC ) 10 MG tablet Take 1 tablet (10 mg total) by mouth at bedtime.   aspirin  EC 81 MG tablet Take 81 mg by mouth daily. Swallow whole.   atorvastatin  (LIPITOR) 40 MG tablet Take 1 tablet (40 mg total) by mouth at bedtime.   b complex vitamins capsule Take 1 capsule by mouth daily.   citalopram  (CELEXA ) 20 MG  tablet Take 1 tablet (20 mg total) by mouth daily.   cloNIDine  (CATAPRES  - DOSED IN MG/24 HR) 0.2 mg/24hr patch Place 1 patch  (0.2 mg total) onto the skin once a week.   eplerenone  (INSPRA ) 50 MG tablet Take 1 tablet (50 mg total) by mouth 2 (two) times daily.   hydrALAZINE  (APRESOLINE ) 100 MG tablet Take 1 tablet (100 mg total) by mouth every 8 (eight) hours.   levETIRAcetam  (KEPPRA ) 500 MG tablet Take 1 tablet (500 mg total) by mouth 2 (two) times daily.   magnesium  oxide (MAG-OX) 400 MG tablet Take 1 tablet (400 mg total) by mouth daily.   metoprolol  tartrate (LOPRESSOR ) 50 MG tablet Take 1 & 1/2 tablets (75 mg total) by mouth 2 (two) times daily.   pantoprazole  (PROTONIX ) 40 MG tablet Take 1 tablet (40 mg total) by mouth at bedtime.   senna-docusate (STIMULANT LAXATIVE) 8.6-50 MG tablet Take 2 tablets by mouth at bedtime as needed for mild constipation.   tiZANidine  (ZANAFLEX ) 4 MG tablet Take 1 tablet (4 mg total) by mouth every 8 (eight) hours as needed for muscle spasms (spasticity/tightness).   traZODone  (DESYREL ) 150 MG tablet Take 0.5 tablets (75 mg total) by mouth at bedtime.   valsartan  (DIOVAN ) 80 MG tablet Take 1 tablet (80 mg total) by mouth daily.   No facility-administered encounter medications on file as of 02/01/2024.    History: Past Medical History:  Diagnosis Date   COVID-19 2021   continues to have increase in suptum production.   Hypertension    Sleep apnea 02/16/2006   Stroke (HCC) 04/13/2021   TIA (transient ischemic attack)    History reviewed. No pertinent surgical history. Family History  Problem Relation Age of Onset   Hypertension Mother    Cancer Mother    Hypertension Father    Hypertension Sister    Hypertension Brother    Social History   Occupational History   Not on file  Tobacco Use   Smoking status: Never   Smokeless tobacco: Never  Vaping Use   Vaping status: Never Used  Substance and Sexual Activity   Alcohol use: Never   Drug use: Never   Sexual activity: Not Currently    Birth control/protection: None   Tobacco Counseling Counseling given: Not  Answered No ETOH in excess.  SDOH Screenings   Food Insecurity: No Food Insecurity (02/01/2024)  Housing: High Risk (02/01/2024)  Transportation Needs: No Transportation Needs (02/01/2024)  Utilities: Not At Risk (02/01/2024)  Alcohol Screen: Low Risk (02/01/2024)  Depression (PHQ2-9): Low Risk (02/01/2024)  Recent Concern: Depression (PHQ2-9) - Medium Risk (11/08/2023)  Financial Resource Strain: Low Risk (02/01/2024)  Recent Concern: Financial Resource Strain - Medium Risk (11/08/2023)  Physical Activity: Insufficiently Active (02/01/2024)  Social Connections: Moderately Integrated (02/01/2024)  Recent Concern: Social Connections - Moderately Isolated (11/08/2023)  Stress: No Stress Concern Present (02/01/2024)  Tobacco Use: Low Risk (02/01/2024)  Health Literacy: Adequate Health Literacy (02/01/2024)   See flowsheets for full screening details  Depression Screen PHQ 2 & 9 Depression Scale- Over the past 2 weeks, how often have you been bothered by any of the following problems? Little interest or pleasure in doing things: 0 Feeling down, depressed, or hopeless (PHQ Adolescent also includes...irritable): 0 PHQ-2 Total Score: 0 Trouble falling or staying asleep, or sleeping too much: 2 Feeling tired or having little energy: 2 Poor appetite or overeating (PHQ Adolescent also includes...weight loss): 0 Feeling bad about yourself - or that you are a failure or  have let yourself or your family down: 0 Trouble concentrating on things, such as reading the newspaper or watching television (PHQ Adolescent also includes...like school work): 1 Moving or speaking so slowly that other people could have noticed. Or the opposite - being so fidgety or restless that you have been moving around a lot more than usual: 0 Thoughts that you would be better off dead, or of hurting yourself in some way: 0 PHQ-9 Total Score: 7 If you checked off any problems, how difficult have these problems made it for  you to do your work, take care of things at home, or get along with other people?: Somewhat difficult Doing well on Celexa      02/01/2024    9:07 AM  MMSE - Mini Mental State Exam  Orientation to time 5  Orientation to Place 5  Registration 3  Attention/ Calculation 5  Recall 2  Language- name 2 objects 2  Language- repeat 1  Language- follow 3 step command 3  Language- read & follow direction 1  Write a sentence 1  Copy design 1  Total score 29        Goals Addressed             This Visit's Progress    Weight (lb) < 200 lb (90.7 kg)   203 lb (92.1 kg)    Wants to be down 20 lbs.  Start doing Meditterran diet starting 2026             Objective:    Today's Vitals   02/01/24 0911 02/01/24 0945  BP: (!) 91/53 (!) 99/59  Pulse: 70   Temp: 97.6 F (36.4 C)   TempSrc: Oral   SpO2: 98%   Weight: 203 lb (92.1 kg)   Height: 5' 3 (1.6 m)    Body mass index is 35.96 kg/m.   Physical Exam{(optional), or other factors deemed appropriate based on the beneficiary's medical and social history and current clinical standards. -  Chest: CTA BL CVS: RRR  Hearing/Vision screen Whisper test was normal  Vision Screening   Right eye Left eye Both eyes  Without correction     With correction 20/20 20/30 20/13    Immunizations and Health Maintenance Health Maintenance  Topic Date Due   Medicare Annual Wellness (AWV)  Never done   Hepatitis C Screening  Never done   Hepatitis B Vaccines 19-59 Average Risk (1 of 3 - 19+ 3-dose series) Never done   Colonoscopy  Never done   COLON CANCER SCREENING ANNUAL FOBT  08/01/2022   COVID-19 Vaccine (1 - 2025-26 season) Never done   Influenza Vaccine  05/16/2024 (Originally 09/17/2023)   Pneumococcal Vaccine: 50+ Years (1 of 2 - PCV) 11/07/2024 (Originally 07/20/1986)   Zoster Vaccines- Shingrix (1 of 2) 11/07/2024 (Originally 07/19/2017)   DTaP/Tdap/Td (2 - Td or Tdap) 07/29/2031   HIV Screening  Completed   HPV VACCINES  Aged  Out   Meningococcal B Vaccine  Aged Out   Declined COVID, flu and hep B vaccines today Decline hep C screening. EKG: declined. Has previous ones on chart     Assessment/Plan:  This is a routine wellness examination for Miguel Mccullough.  1. Encounter for Medicare annual wellness exam (Primary)   2. Advance directive discussed with patient I spent some time discussing advance directive including what is a living will and healthcare power of attorney.  Patient and his wife given a packet.  Advised that if they execute a living will or healthcare  power of attorney, to bring a copy for our records.  3. Class 2 severe obesity due to excess calories with serious comorbidity and body mass index (BMI) of 35.0 to 35.9 in adult Patient and wife plans to start following Mediterranean diet, January 1.  I certainly support this.  Printed information given to them on the Mediterranean diet.  4. Screening for colon cancer Colon cancer screening methods discussed.  He is at moderate risk for colon cancer.  He prefers to do the fit test. - Fecal occult blood, imunochemical([facility])  5. Influenza vaccination declined  6. Screening for hepatitis C declined  7. Hepatitis B vaccination declined  8. Essential hypertension Blood pressure today is low.  Blood pressure reading was good when he saw the nephrologist several weeks ago.  Nephrologist recommended trying to wean him off clonidine  or hydralazine .  We will start with the clonidine  decreasing it from 0.2 mg once a week to 0.1 mg once a week. -Advised to check blood pressure at least twice a week with goal being 130/80 or lower.  As long as his blood pressure remains good, after 2 weeks of being on the 0.1 mg clonidine  patch, he can try stopping the patch altogether but continue to monitor blood pressure.  He expressed understanding and is agreeable to this. - cloNIDine  (CATAPRES  - DOSED IN MG/24 HR) 0.1 mg/24hr patch; Place 1 patch (0.1 mg total) onto  the skin once a week.  Dispense: 4 patch; Refill: 3  Patient Care Team: Vicci Barnie NOVAK, MD as PCP - General (Internal Medicine)  I have personally reviewed and noted the following in the patients chart:   Medical and social history Use of alcohol, tobacco or illicit drugs  Current medications and supplements including opioid prescriptions. Functional ability and status Nutritional status Physical activity Advanced directives List of other physicians Hospitalizations, surgeries, and ER visits in previous 12 months Vitals Screenings to include cognitive, depression, and falls Referrals and appointments  Orders Placed This Encounter  Procedures   Fecal occult blood, imunochemical([facility])   In addition, I have reviewed and discussed with patient certain preventive protocols, quality metrics, and best practice recommendations. A written personalized care plan for preventive services as well as general preventive health recommendations were provided to patient.   Barnie Vicci, MD   02/01/2024   Return in about 4 months (around 06/01/2024).

## 2024-02-16 ENCOUNTER — Ambulatory Visit: Payer: Self-pay | Admitting: Internal Medicine

## 2024-02-16 NOTE — Progress Notes (Deleted)
 "   Name: Miguel Mccullough  MRN/ DOB: [id number], 07-08-1965    Age/ Sex: 58 y.o., male    PCP: Vicci Barnie NOVAK, MD   Reason for Endocrinology Evaluation: Hyperaldosteronism     Date of Initial Endocrinology Evaluation: 09/18/2022    HPI: [name] is a 58 y.o. male with a past medical history of HTN, CKD, CVA. The patient presented for initial endocrinology clinic visit on 09/18/2022 for consultative assistance with his hyperaldosteronism.   During evaluation for resistant hypertension patient has been noted with elevated Aldo/renin ratio in April 2023At 55.8, renin normal 0.217 NG/mL/HR, and normal aldosterone at 12.1 NG/DL    Has left sided weakness secondary to hemorrhagic stroke in 2023  He was diagnosed wit HTN late 20's   Mother and father with DM    On his initial visit given hemorrhagic stroke and hemiparesis, we have opted to treat him medically as the risk of discontinuing eplerenone  and going through  invasive testing would have have outweighed  the benefit.  24-hour urinary cortisol was normal at 30.7 mcg 09/2022  SUBJECTIVE:    Today (02/16/2024):  Miguel Mccullough is here for follow-up on hypertension.  The patient has not had adrenal imaging that was ordered on his last visit here 07/2023.  It appears that they attempted to contact the patient twice with no success  Patient follows with nephrology, which recommended trying to wean the patient off clonidine  or hydralazine  He also saw his PCP a few weeks ago and they have already decreased clonidine   BP at home SBP 112-130 mmhg  Diastolic 80's   Denies headaches or vision changes  Denies chest pain or SOB  Improving occasional left LE swelling  No polydipsia    Diovan  80 mg daily Hydralazine  100 mg 3 times daily Eplerenone  50 mg  BID  Amlodipine  10 mg daily Metoprolol  50 mg, 1.5 tabs  BID Clonidine  0.1 mg patch every Friday      HISTORY:  Past Medical History:  Past Medical History:  Diagnosis  Date   COVID-19 2021   continues to have increase in suptum production.   Hypertension    Sleep apnea 02/16/2006   Stroke (HCC) 04/13/2021   TIA (transient ischemic attack)    Past Surgical History: No past surgical history on file.  Social History:  reports that he has never smoked. He has never used smokeless tobacco. He reports that he does not drink alcohol and does not use drugs. Family History: family history includes Cancer in his mother; Hypertension in his brother, father, mother, and sister.   HOME MEDICATIONS: Allergies as of 02/16/2024       Reactions   Spironolactone          Medication List        Accurate as of February 16, 2024  7:07 AM. If you have any questions, ask your nurse or doctor.          amLODipine  10 MG tablet Commonly known as: NORVASC  Take 1 tablet (10 mg total) by mouth at bedtime.   aspirin  EC 81 MG tablet Take 81 mg by mouth daily. Swallow whole.   atorvastatin  40 MG tablet Commonly known as: LIPITOR Take 1 tablet (40 mg total) by mouth at bedtime.   b complex vitamins capsule Take 1 capsule by mouth daily.   citalopram  20 MG tablet Commonly known as: CELEXA  Take 1 tablet (20 mg total) by mouth daily.   cloNIDine  0.1 mg/24hr patch Commonly known as:  CATAPRES  - Dosed in mg/24 hr Place 1 patch (0.1 mg total) onto the skin once a week.   eplerenone  50 MG tablet Commonly known as: INSPRA  Take 1 tablet (50 mg total) by mouth 2 (two) times daily.   hydrALAZINE  100 MG tablet Commonly known as: APRESOLINE  Take 1 tablet (100 mg total) by mouth every 8 (eight) hours.   levETIRAcetam  500 MG tablet Commonly known as: KEPPRA  Take 1 tablet (500 mg total) by mouth 2 (two) times daily.   magnesium  oxide 400 MG tablet Commonly known as: MAG-OX Take 1 tablet (400 mg total) by mouth daily.   metoprolol  tartrate 50 MG tablet Commonly known as: LOPRESSOR  Take 1 & 1/2 tablets (75 mg total) by mouth 2 (two) times daily.   pantoprazole   40 MG tablet Commonly known as: PROTONIX  Take 1 tablet (40 mg total) by mouth at bedtime.   Senna-Plus 8.6-50 MG tablet Generic drug: senna-docusate Take 2 tablets by mouth at bedtime as needed for mild constipation.   tiZANidine  4 MG tablet Commonly known as: Zanaflex  Take 1 tablet (4 mg total) by mouth every 8 (eight) hours as needed for muscle spasms (spasticity/tightness).   traZODone  150 MG tablet Commonly known as: DESYREL  Take 0.5 tablets (75 mg total) by mouth at bedtime.   valsartan  80 MG tablet Commonly known as: Diovan  Take 1 tablet (80 mg total) by mouth daily.          REVIEW OF SYSTEMS: A comprehensive ROS was conducted with the patient and is negative except as per HPI    OBJECTIVE:  VS: There were no vitals taken for this visit.   Wt Readings from Last 3 Encounters:  02/01/24 203 lb (92.1 kg)  11/08/23 199 lb (90.3 kg)  08/09/23 205 lb (93 kg)     EXAM: General: Pt appears well and is in NAD  Lungs: Clear with good BS bilat   Heart: Auscultation: RRR.  Extremities:  BL LE: 1+ pretibial edema on the left   Mental Status: Judgment, insight: Intact Orientation: Oriented to time, place, and person Mood and affect: No depression, anxiety, or agitation     DATA REVIEWED:   Latest Reference Range & Units 11/12/23 16:56 12/16/23 09:34  Sodium 134 - 144 mmol/L 141   Potassium 3.5 - 5.2 mmol/L 4.3   Chloride 96 - 106 mmol/L 105   CO2 20 - 29 mmol/L 22   Glucose 70 - 99 mg/dL 856 (H)   BUN 6 - 24 mg/dL 18   Creatinine 9.23 - 1.27 mg/dL 8.01 (H)   Calcium  8.7 - 10.2 mg/dL 9.1   BUN/Creatinine Ratio 9 - 20  9   eGFR >59 mL/min/1.73 39 (L) 35.0 (E)      Old records , labs and images have been reviewed.    ASSESSMENT/PLAN/RECOMMENDATIONS:   Resistant hypertension:  -Patient with abnormal Aldo/PRA ratio > 50 - In the past we have opted to avoid invasive testing due to hx of hemorrhagic stroke in 2023. Due to  the risk of elevated BP during  this process, we opted to treat the patient medically. Today I entertained the idea of considering adrenal venous sampling as his BP has been stable and it has been 2 years since his CVA -24-hour urinary cortisol normal - We will proceed with adrenal imaging for MRI -No changes today   Medications :  Continue clonidine  0.1 mg patch weekly Continue eplerenone  50 mg BID  Continue Diovan  80 mg daily Continue hydralazine  100 mg 3 times  daily Continue metoprolol  50 mg, 1.5 tablets twice daily Continue amlodipine  10 mg daily      Follow-up in 6 months    Signed electronically by: Stefano Redgie Butts, MD  [facility]  [facility] [address] [place], [place] [address] Phone: [phone] FAX: [phone]   CC: Vicci Barnie NOVAK, MD [address] [place] [address] Phone: [phone] Fax: [phone]   Return to Endocrinology clinic as below: Future Appointments  Date Time Provider Department Center  02/16/2024  8:30 AM Haydon Dorris, Donell Redgie, MD [id number] None  06/01/2024  1:50 PM Vicci Barnie NOVAK, MD [id number] [address]         "

## 2024-02-21 ENCOUNTER — Other Ambulatory Visit: Payer: Self-pay | Admitting: Family Medicine

## 2024-02-21 ENCOUNTER — Other Ambulatory Visit: Payer: Self-pay | Admitting: Internal Medicine

## 2024-02-22 ENCOUNTER — Other Ambulatory Visit: Payer: Self-pay

## 2024-02-22 MED ORDER — MAGNESIUM OXIDE 400 MG PO TABS
400.0000 mg | ORAL_TABLET | Freq: Every day | ORAL | 1 refills | Status: AC
  Filled 2024-02-22: qty 90, 90d supply, fill #0

## 2024-02-22 MED ORDER — TIZANIDINE HCL 4 MG PO TABS
4.0000 mg | ORAL_TABLET | Freq: Three times a day (TID) | ORAL | 0 refills | Status: AC | PRN
  Filled 2024-02-22 – 2024-03-02 (×2): qty 90, 30d supply, fill #0

## 2024-02-22 MED ORDER — LEVETIRACETAM 500 MG PO TABS
500.0000 mg | ORAL_TABLET | Freq: Two times a day (BID) | ORAL | 1 refills | Status: AC
  Filled 2024-02-22: qty 180, 90d supply, fill #0

## 2024-02-23 ENCOUNTER — Other Ambulatory Visit: Payer: Self-pay

## 2024-03-02 ENCOUNTER — Other Ambulatory Visit: Payer: Self-pay

## 2024-06-01 ENCOUNTER — Ambulatory Visit: Payer: Self-pay | Admitting: Internal Medicine
